# Patient Record
Sex: Male | Born: 1935 | Race: White | Hispanic: No | Smoking: Former smoker
Health system: Southern US, Community
[De-identification: ages and names within clinical notes are randomized; demographics above are authoritative.]

## PROBLEM LIST (undated history)

## (undated) DIAGNOSIS — I10 Essential (primary) hypertension: Secondary | ICD-10-CM

## (undated) DIAGNOSIS — K219 Gastro-esophageal reflux disease without esophagitis: Secondary | ICD-10-CM

## (undated) DIAGNOSIS — I2510 Atherosclerotic heart disease of native coronary artery without angina pectoris: Secondary | ICD-10-CM

## (undated) DIAGNOSIS — M1990 Unspecified osteoarthritis, unspecified site: Secondary | ICD-10-CM

## (undated) DIAGNOSIS — K5900 Constipation, unspecified: Secondary | ICD-10-CM

## (undated) DIAGNOSIS — C801 Malignant (primary) neoplasm, unspecified: Secondary | ICD-10-CM

## (undated) DIAGNOSIS — R32 Unspecified urinary incontinence: Secondary | ICD-10-CM

## (undated) DIAGNOSIS — E7800 Pure hypercholesterolemia, unspecified: Secondary | ICD-10-CM

## (undated) DIAGNOSIS — G4730 Sleep apnea, unspecified: Secondary | ICD-10-CM

## (undated) DIAGNOSIS — H9190 Unspecified hearing loss, unspecified ear: Secondary | ICD-10-CM

## (undated) DIAGNOSIS — I739 Peripheral vascular disease, unspecified: Secondary | ICD-10-CM

## (undated) HISTORY — PX: CARDIAC CATHETERIZATION: SHX172

## (undated) HISTORY — PX: ANAL FISSURE REPAIR: SHX2312

## (undated) HISTORY — PX: NASAL SINUS SURGERY: SHX719

## (undated) HISTORY — PX: IRRIGATION AND DEBRIDEMENT ABSCESS: SHX5252

## (undated) HISTORY — PX: CORONARY STENT PLACEMENT: SHX1402

## (undated) HISTORY — PX: COLON SURGERY: SHX602

## (undated) HISTORY — PX: COLONOSCOPY W/ POLYPECTOMY: SHX1380

## (undated) HISTORY — PX: LAPAROTOMY: SHX154

## (undated) HISTORY — PX: ROTATOR CUFF REPAIR: SHX139

## (undated) HISTORY — PX: PROSTATE SURGERY: SHX751

## (undated) HISTORY — PX: EYE SURGERY: SHX253

---

## 1997-10-10 ENCOUNTER — Encounter: Admission: RE | Admit: 1997-10-10 | Discharge: 1998-01-08 | Payer: Self-pay | Admitting: Orthopedic Surgery

## 1999-08-27 ENCOUNTER — Ambulatory Visit (HOSPITAL_COMMUNITY): Admission: RE | Admit: 1999-08-27 | Discharge: 1999-08-27 | Payer: Self-pay | Admitting: Rheumatology

## 1999-09-11 ENCOUNTER — Encounter: Payer: Self-pay | Admitting: Vascular Surgery

## 1999-09-14 ENCOUNTER — Encounter (INDEPENDENT_AMBULATORY_CARE_PROVIDER_SITE_OTHER): Payer: Self-pay

## 1999-09-14 ENCOUNTER — Inpatient Hospital Stay: Admission: RE | Admit: 1999-09-14 | Discharge: 1999-09-15 | Payer: Self-pay | Admitting: Vascular Surgery

## 2000-04-16 ENCOUNTER — Emergency Department (HOSPITAL_COMMUNITY): Admission: EM | Admit: 2000-04-16 | Discharge: 2000-04-16 | Payer: Self-pay | Admitting: Emergency Medicine

## 2000-04-16 ENCOUNTER — Encounter: Payer: Self-pay | Admitting: Emergency Medicine

## 2000-10-18 ENCOUNTER — Ambulatory Visit (HOSPITAL_COMMUNITY): Admission: RE | Admit: 2000-10-18 | Discharge: 2000-10-19 | Payer: Self-pay | Admitting: Orthopedic Surgery

## 2002-03-29 ENCOUNTER — Ambulatory Visit (HOSPITAL_COMMUNITY): Admission: RE | Admit: 2002-03-29 | Discharge: 2002-03-29 | Payer: Self-pay | Admitting: Gastroenterology

## 2003-03-14 ENCOUNTER — Emergency Department (HOSPITAL_COMMUNITY): Admission: EM | Admit: 2003-03-14 | Discharge: 2003-03-14 | Payer: Self-pay | Admitting: Emergency Medicine

## 2003-04-29 ENCOUNTER — Ambulatory Visit (HOSPITAL_COMMUNITY): Admission: RE | Admit: 2003-04-29 | Discharge: 2003-04-29 | Payer: Self-pay | Admitting: Rheumatology

## 2003-05-07 ENCOUNTER — Inpatient Hospital Stay (HOSPITAL_COMMUNITY): Admission: AD | Admit: 2003-05-07 | Discharge: 2003-05-13 | Payer: Self-pay | Admitting: Internal Medicine

## 2003-05-07 ENCOUNTER — Ambulatory Visit (HOSPITAL_COMMUNITY): Admission: RE | Admit: 2003-05-07 | Discharge: 2003-05-07 | Payer: Self-pay | Admitting: Rheumatology

## 2004-09-24 ENCOUNTER — Ambulatory Visit (HOSPITAL_COMMUNITY): Admission: RE | Admit: 2004-09-24 | Discharge: 2004-09-24 | Payer: Self-pay | Admitting: Rheumatology

## 2006-01-24 ENCOUNTER — Inpatient Hospital Stay (HOSPITAL_COMMUNITY): Admission: EM | Admit: 2006-01-24 | Discharge: 2006-01-27 | Payer: Self-pay | Admitting: Emergency Medicine

## 2006-02-10 ENCOUNTER — Encounter (HOSPITAL_COMMUNITY): Admission: RE | Admit: 2006-02-10 | Discharge: 2006-05-11 | Payer: Self-pay | Admitting: *Deleted

## 2006-07-24 ENCOUNTER — Emergency Department (HOSPITAL_COMMUNITY): Admission: EM | Admit: 2006-07-24 | Discharge: 2006-07-24 | Payer: Self-pay | Admitting: Emergency Medicine

## 2007-04-07 ENCOUNTER — Inpatient Hospital Stay (HOSPITAL_COMMUNITY): Admission: EM | Admit: 2007-04-07 | Discharge: 2007-04-14 | Payer: Self-pay | Admitting: Emergency Medicine

## 2007-06-28 ENCOUNTER — Encounter: Admission: RE | Admit: 2007-06-28 | Discharge: 2007-06-28 | Payer: Self-pay | Admitting: *Deleted

## 2008-03-07 HISTORY — PX: OTHER SURGICAL HISTORY: SHX169

## 2008-04-06 ENCOUNTER — Inpatient Hospital Stay (HOSPITAL_COMMUNITY): Admission: EM | Admit: 2008-04-06 | Discharge: 2008-04-08 | Payer: Self-pay | Admitting: Emergency Medicine

## 2010-05-29 ENCOUNTER — Inpatient Hospital Stay (HOSPITAL_COMMUNITY): Admission: EM | Admit: 2010-05-29 | Discharge: 2010-06-03 | Payer: Self-pay | Admitting: Emergency Medicine

## 2010-09-23 LAB — CBC
HCT: 34.3 % — ABNORMAL LOW (ref 39.0–52.0)
HCT: 34.4 % — ABNORMAL LOW (ref 39.0–52.0)
HCT: 35.5 % — ABNORMAL LOW (ref 39.0–52.0)
HCT: 35.6 % — ABNORMAL LOW (ref 39.0–52.0)
HCT: 35.7 % — ABNORMAL LOW (ref 39.0–52.0)
Hemoglobin: 11.4 g/dL — ABNORMAL LOW (ref 13.0–17.0)
Hemoglobin: 11.5 g/dL — ABNORMAL LOW (ref 13.0–17.0)
Hemoglobin: 11.6 g/dL — ABNORMAL LOW (ref 13.0–17.0)
Hemoglobin: 11.8 g/dL — ABNORMAL LOW (ref 13.0–17.0)
Hemoglobin: 11.8 g/dL — ABNORMAL LOW (ref 13.0–17.0)
Hemoglobin: 11.9 g/dL — ABNORMAL LOW (ref 13.0–17.0)
MCH: 30.7 pg (ref 26.0–34.0)
MCH: 30.9 pg (ref 26.0–34.0)
MCHC: 33 g/dL (ref 30.0–36.0)
MCHC: 33.1 g/dL (ref 30.0–36.0)
MCHC: 33.1 g/dL (ref 30.0–36.0)
MCHC: 33.5 g/dL (ref 30.0–36.0)
MCHC: 33.5 g/dL (ref 30.0–36.0)
MCV: 92.7 fL (ref 78.0–100.0)
MCV: 93.2 fL (ref 78.0–100.0)
MCV: 93.2 fL (ref 78.0–100.0)
MCV: 93.4 fL (ref 78.0–100.0)
Platelets: 208 10*3/uL (ref 150–400)
Platelets: 215 10*3/uL (ref 150–400)
Platelets: 216 K/uL (ref 150–400)
RBC: 3.69 MIL/uL — ABNORMAL LOW (ref 4.22–5.81)
RBC: 3.7 MIL/uL — ABNORMAL LOW (ref 4.22–5.81)
RBC: 3.77 MIL/uL — ABNORMAL LOW (ref 4.22–5.81)
RBC: 3.84 MIL/uL — ABNORMAL LOW (ref 4.22–5.81)
RDW: 13.8 % (ref 11.5–15.5)
WBC: 10.4 10*3/uL (ref 4.0–10.5)
WBC: 11.1 10*3/uL — ABNORMAL HIGH (ref 4.0–10.5)
WBC: 11.9 10*3/uL — ABNORMAL HIGH (ref 4.0–10.5)
WBC: 9.2 K/uL (ref 4.0–10.5)
WBC: 9.8 10*3/uL (ref 4.0–10.5)

## 2010-09-23 LAB — BASIC METABOLIC PANEL
BUN: 20 mg/dL (ref 6–23)
CO2: 23 mEq/L (ref 19–32)
Calcium: 8.7 mg/dL (ref 8.4–10.5)
Calcium: 8.7 mg/dL (ref 8.4–10.5)
Chloride: 107 mEq/L (ref 96–112)
Chloride: 108 mEq/L (ref 96–112)
Chloride: 109 mEq/L (ref 96–112)
Creatinine, Ser: 1.03 mg/dL (ref 0.4–1.5)
GFR calc Af Amer: >60 mL/min (ref >60)
GFR calc Af Amer: >60 mL/min (ref >60)
GFR calc Af Amer: >60 mL/min (ref >60)
GFR calc non Af Amer: 57 mL/min — ABNORMAL LOW (ref >60)
GFR calc non Af Amer: >60 mL/min (ref >60)
GFR calc non Af Amer: >60 mL/min (ref >60)
Glucose, Bld: 109 mg/dL — ABNORMAL HIGH (ref 70–99)
Glucose, Bld: 112 mg/dL — ABNORMAL HIGH (ref 70–99)
Glucose, Bld: 116 mg/dL — ABNORMAL HIGH (ref 70–99)
Potassium: 3.7 mEq/L (ref 3.5–5.1)
Potassium: 3.9 mEq/L (ref 3.5–5.1)
Potassium: 4.1 mEq/L (ref 3.5–5.1)
Potassium: 4.2 mEq/L (ref 3.5–5.1)
Sodium: 137 mEq/L (ref 135–145)
Sodium: 139 mEq/L (ref 135–145)
Sodium: 140 mEq/L (ref 135–145)

## 2010-09-23 LAB — LIPID PANEL
HDL: 37 mg/dL — ABNORMAL LOW (ref >39)
LDL Cholesterol: 84 mg/dL (ref 0–99)
VLDL: 14 mg/dL (ref 0–40)

## 2010-09-23 LAB — CARDIAC PANEL(CRET KIN+CKTOT+MB+TROPI)
CK, MB: 1.3 ng/mL (ref 0.3–4.0)
CK, MB: 1.5 ng/mL (ref 0.3–4.0)
Relative Index: INVALID (ref 0.0–2.5)
Relative Index: INVALID (ref 0.0–2.5)
Troponin I: 0.02 ng/mL (ref 0.00–0.06)

## 2010-09-23 LAB — COMPREHENSIVE METABOLIC PANEL WITH GFR
ALT: 18 U/L (ref 0–53)
Calcium: 9 mg/dL (ref 8.4–10.5)
Chloride: 106 meq/L (ref 96–112)
Creatinine, Ser: 1.06 mg/dL (ref 0.4–1.5)
Glucose, Bld: 134 mg/dL — ABNORMAL HIGH (ref 70–99)
Sodium: 139 meq/L (ref 135–145)

## 2010-09-23 LAB — COMPREHENSIVE METABOLIC PANEL
AST: 26 U/L (ref 0–37)
Albumin: 3.6 g/dL (ref 3.5–5.2)
Alkaline Phosphatase: 59 U/L (ref 39–117)
BUN: 16 mg/dL (ref 6–23)
CO2: 23 mEq/L (ref 19–32)
GFR calc Af Amer: >60 mL/min (ref >60)
GFR calc non Af Amer: >60 mL/min (ref >60)
Potassium: 3.7 mEq/L (ref 3.5–5.1)
Total Bilirubin: 0.5 mg/dL (ref 0.3–1.2)
Total Protein: 6.8 g/dL (ref 6.0–8.3)

## 2010-09-23 LAB — HEPARIN LEVEL (UNFRACTIONATED)
Heparin Unfractionated: 0.33 IU/mL (ref 0.30–0.70)
Heparin Unfractionated: 0.5 IU/mL (ref 0.30–0.70)
Heparin Unfractionated: 0.63 IU/mL (ref 0.30–0.70)

## 2010-09-23 LAB — CK TOTAL AND CKMB (NOT AT ARMC)
CK, MB: 2.2 ng/mL (ref 0.3–4.0)
Relative Index: 1.9 (ref 0.0–2.5)
Total CK: 113 U/L (ref 7–232)

## 2010-09-23 LAB — PROTIME-INR
INR: 0.92 (ref 0.00–1.49)
Prothrombin Time: 12.6 s (ref 11.6–15.2)

## 2010-09-23 LAB — POCT CARDIAC MARKERS
CKMB, poc: 1.1 ng/mL (ref 1.0–8.0)
Myoglobin, poc: 96.5 ng/mL (ref 12–200)
Troponin i, poc: <0.05 ng/mL (ref 0.00–0.09)

## 2010-09-23 LAB — D-DIMER, QUANTITATIVE: D-Dimer, Quant: 0.95 ug/mL-FEU — ABNORMAL HIGH (ref 0.00–0.48)

## 2010-09-23 LAB — MRSA PCR SCREENING: MRSA by PCR: NEGATIVE

## 2010-09-23 LAB — TROPONIN I: Troponin I: 0.03 ng/mL (ref 0.00–0.06)

## 2010-09-23 LAB — BRAIN NATRIURETIC PEPTIDE: Pro B Natriuretic peptide (BNP): 224 pg/mL — ABNORMAL HIGH (ref 0.0–100.0)

## 2010-09-23 LAB — TSH: TSH: 2.044 u[IU]/mL (ref 0.350–4.500)

## 2010-09-23 LAB — HEMOGLOBIN A1C
Hgb A1c MFr Bld: 6.2 % — ABNORMAL HIGH (ref <5.7)
Mean Plasma Glucose: 131 mg/dL — ABNORMAL HIGH (ref <117)
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
## 2010-11-24 NOTE — Cardiovascular Report (Signed)
NAMEMASAHIRO, Huffman NO.:  [id number]   MEDICAL RECORD NO.:  [id number]          PATIENT TYPE:  INP   LOCATION:  6532                         FACILITY:  [facility]   PHYSICIAN:  Nicki Guadalajara, M.D.     DATE OF BIRTH:  [birth year]   DATE OF PROCEDURE:  04/12/2007  DATE OF DISCHARGE:                            CARDIAC CATHETERIZATION   INDICATIONS:  [name] is an 78 year old gentleman who has  a history of hypertension, known coronary artery disease.  In July 2007  he underwent insertion of 3.5 x 20-mm Taxus stent into the proximal  circumflex vessel.  At that time, he did have diagonal LAD disease which  was treated medically.  He had presented this admission with chest pain  compatible with unstable angina.  Catheterization was done on April 10, 2007, which showed eccentric 85% in-stent restenosis in the proximal  circumflex as well as progressive diagonal LAD disease with 90% ostial  diagonal, as well as 80% narrowing in the LAD at the bifurcation.  At  the time of his diagnostic study he underwent cutting balloon arthrotomy  and PTCA with ultimate dilatation of his in-stent restenosis to 4.29 mm  in a large circumflex system.  He is now brought back to the laboratory  2 days later following adequate hydration for attempt at complex  intervention for his bifurcation stenosis of his diagonal and LAD  system.   PROCEDURE:  After premedication with Valium 4 mg intravenously, the  patient was prepped and draped in usual fashion.  His right femoral  artery was punctured anteriorly and a 6-French sheath was inserted.  There was difficulty in having a guiding catheter get coaxial into the  LAD system, and multiple guiding catheters were initially used including  an XB3.5, FL4, Q-curve 4.0, and ultimately a CLS4.0 was successful.  Angiomax was administered.  The patient has been on Plavix.  He did  receive 75 mg of additional Plavix in the laboratory.  A  double-wire  technique was used for the bifurcation stenosis and a Luge wire,  following adequate anticoagulation documentation, was advanced down the  diagonal vessel.  A Prowater wire was advanced down the LAD.  Initially,  cutting balloon arthrotomy was performed with a 2.5 x 10-mm cutting  balloon positioned over the wire in the diagonal vessel.  Multiple  dilatations were made at the ostium of the diagonal vessel.  The cutting  balloon was then removed and advanced over the wire in the LAD system  and multiple dilatations were made up to 6 atmospheres in the LAD, again  at the bifurcation to reduce potential for plaque shifting during  stenting of the LAD.  A 3.0 x 18-mm drug-eluting Promus stent was then  successfully inserted and deployed x2 up to 15 atmospheres.  This  resulted in a good angiographic result of the LAD, although there was  some mild diffuse disease beyond the stented segment which did have some  mild haziness.  In addition, there did appear to be additional ostial  encroachment of the diagonal vessel.  The  diagonal was then rewired.  Post stent dilatation was done in the LAD stent with a 3.25 x 12-mm  Quantum balloon.  Very low level inflation was done distally just beyond  the stent to ensure a smooth transition since there was smooth diffuse  disease beyond this stented segment.  A new 2.5 x 12-mm Maverick balloon  was then inserted over the wire down across the stent into the diagonal  vessel.  Dilatation was done in the stent strut to ensure optimal ostial  dilatation of the diagonal vessel up to 6 atmospheres.  Scout  angiography confirmed a good angiographic result.  During the procedure,  the patient was on increasing doses of IV nitroglycerin as well as  received several doses of 200 mcg of intracoronary nitroglycerin.  He  tolerated the procedure well.  He also received an additional 25 mg of  fentanyl for back comfort.  Plans are for sheath removal later  today.   HEMODYNAMIC DATA:  Central aortic pressure was 135/67.  During the  procedure, his systolic blood pressure did increase to 180 mg for which  his IV nitroglycerin dose was ultimately titrated up to 40 mcg.   HEMODYNAMIC DATA:  Left main coronary artery was a short vessel that  immediately bifurcated into the LAD and left circumflex system.  The  previous intervention in the circumflex stented segment remained widely  patent.  The LAD had diffuse bifurcation stenosis with 90% narrowing in  the ostium of the first diagonal vessel and diffuse 85% stenoses in the  LAD at the bifurcation and extending beyond the bifurcation.  This was  then followed by a diffuse 30% narrowing and again followed by mid LAD  30% narrowing.  Following cutting balloon arthrotomy into the diagonal  vessel and LAD, with stenting of the LAD with a 3-0 x 18-mm drug-eluting  Promus stent with post dilatation up to 3.25 mm and ultimate dilatation  through the stent strut again in the ostium of the diagonal with a 2.5-  mm Maverick balloon, the diffuse 85% stenosis was reduced to 0%.  The  90% ostial diagonal stenosis was reduced to 10%.  There was no change in  the previously noted 20-30% narrowing in the LAD beyond the stented  segment as well as 30% narrowing in the mid LAD.  There was TIMI 3 flow.   IMPRESSION:  Successful complex bifurcation stenosis percutaneous  coronary intervention involving the diagonal and left anterior  descending artery with cutting balloon arthrotomy/percutaneous  transluminal coronary angioplasty of the diagonal vessel and stenting of  the LAD with a 3-0 x 18-mm Promus stent postdilated to 3.25 mm, done  with Angiomax/Plavix/IV and IC nitroglycerin.           ______________________________  Nicki Guadalajara, M.D.     TK/MEDQ  D:  04/12/2007  T:  04/12/2007  Job:  [id number]   cc:   Darlin Priestly, MD  Tammy R. Collins [place], M.D.

## 2010-11-24 NOTE — Discharge Summary (Signed)
NAMEMARKELL, Larry Huffman                  ACCOUNT NO.:  [id number]   MEDICAL RECORD NO.:  [id number]          PATIENT TYPE:  INP   LOCATION:  5127                         FACILITY:  [facility]   PHYSICIAN:  Herbie Saxon, MDDATE OF BIRTH:  [birth year]   DATE OF ADMISSION:  04/06/2008  DATE OF DISCHARGE:  04/08/2008                               DISCHARGE SUMMARY   DISCHARGE DIAGNOSES:  1. Proctitis.  2. Gastroesophageal reflux disease.  3. History of coronary artery disease.  4. Hyperlipidemia.  5. History of right leg deep vein thrombosis.  6. History of obstructive sleep apnea.  7. Hypertension, is stable.  8. Leukocytosis, resolved.  9. Anemia of chronic disease.   RADIOLOGY:  The CT abdomen, pelvis on April 06, 2008, was negative  except for small bilateral renal cyst and tiny right adrenal adenoma.   HOSPITAL COURSE:  This 77 year old Caucasian gentleman presented to the  emergency room because of severe constipation, inability to defecate  with severe pain in the anal and rectal area associated with nausea and  vomiting and some low-grade fever.  On presentation, he was afebrile.  He had leukocytosis of 16,000.  CT scan of the abdomen finding  significant  rectal wall thickening indicative of proctitis.  The  patient was started on stool softeners, Anusol Suppository, IV Cipro and  Flagyl and gentle IV fluid hydration, is clinically much improved within  the last 48 hours.  Leukocytosis is resolved.  The patient is anxious to  be discharged home and the patient's rectal pain is much improved.   DISCHARGE CONDITION:  Stable.   His diet to be low sodium, heart healthy, low cholesterol.   Activity to be increased slowly as tolerated.   Follow up with his primary care physician, Dr. Herb Grays in the next 5-  7 days.   MEDICATIONS ON DISCHARGE:  1. Anusol Suppository 1 b.i.d. for 1 week.  2. Cipro 500 mg b.i.d. for 5 days.  3. Flagyl 500 mg t.i.d. for 5 days.  4.  Tylenol 650 mg q.6 h. p.r.n.   Continue his home medication:  1. Plavix 75 mg daily.  2. Ranitidine 300 mg at bedtime.  3. Isosorbide dinitrate 15 mg daily.  4. Amlodipine 10 mg daily.  5. Detrol LA 4 mg daily.  6. Lasix 20 mg daily.  7. Simvastatin 10 mg daily.  8. Calcium 500 mg daily.  9. Multivitamin 1 tablet daily.  10.Fish oil 1 tablet daily.   PHYSICAL EXAMINATION:  GENERAL:  He is an elderly man not in acute  distress.  VITAL SIGNS:  Temperature is 98, pulse is 60, respiratory rate 18, blood  pressure 120/60.  SKIN:  Pale, not jaundiced.  HEENT:  Head is atraumatic and normocephalic.  Mucous membranes are  moist.  He is hard of hearing.  Good visual acuity.  NECK:  Supple.  There is no JVD or adenopathy.  CHEST:  Clinically clear.  No rhonchi or rales.  HEART:  Heart sounds 1 and 2.  No rubs, gallops, or murmurs.  ABDOMEN:  Benign.  NEUROLOGIC:  He is  alert, oriented in time, place, and person.  EXTREMITIES:  Peripheral pulses present.  No pedal edema.   LABORATORY DATA:  The WBC is 10, hematocrit 32, platelet count is 188.  Chemistry, sodium is 141, potassium 4.1, chloride 109, bicarb 27, BUN  18, creatinine 1.1, glucose is 96.   Discharge time less than 30 minutes.      Herbie Saxon, MD  Electronically Signed     MIO/MEDQ  D:  04/08/2008  T:  04/09/2008  Job:  [id number]

## 2010-11-24 NOTE — Discharge Summary (Signed)
NAMELIO, WEHRLY NO.:  [id number]   MEDICAL RECORD NO.:  [id number]          PATIENT TYPE:  INP   LOCATION:  6532                         FACILITY:  [facility]   PHYSICIAN:  Darlin Priestly, MD  DATE OF BIRTH:  [birth year]   DATE OF ADMISSION:  04/07/2007  DATE OF DISCHARGE:  04/14/2007                               DISCHARGE SUMMARY   DISCHARGE DIAGNOSES:  1. Unstable angina, status post staged PCI to the circumflex and LAD      this admission with post PCI CK bump to 200.  2. Renal artery stenosis with 70-80% right renal artery stenosis.  3. Normal LV function.  4. Treated hypertension.  5. Treated dyslipidemia.   HOSPITAL COURSE:  The patient is a pleasant 77 year old male followed by  Dr. Jenne Campus with a history of coronary disease.  He had an AV groove  intervention in July 2007.  He has peripheral vascular disease with  prior right carotid endarterectomy.  He had done well until admission  April 07, 2007, when he presented with chest pain consistent with  unstable angina.  He was admitted to telemetry, started on heparin and  nitrates.  He was somewhat bradycardic and we backed off on his beta-  blocker on admission.  The patient was taken to the cath lab April 10, 2007 for elective catheterization.  This was done by Dr. Tresa Endo.  He  had a small RCA without significant disease.  The circumflex had an 85%  proximal stenosis.  The LAD had an 80% stenosis just after the takeoff  of the diagonal which had a proximal 90% stenosis.  There was a small  septal perforator with a 90% stenosis as well.  Right renal artery was  narrowed 70-80%.  The patient underwent a circumflex cutting balloon on  April 10, 2007.  They tolerated this well.  We watched his renal  function and kept them until he could undergo a staged intervention to  the LAD.  This was done April 12, 2007.  The LAD received a PROMUS  stent.  The diagonal was angioplastied.   Postprocedure, he did have a CK  bumped to 203 with 19 MBs.  CK is down to 130 on October 3.  He is pain  free.  He has not had arrhythmia.  We feel he could be discharged later  today on October 3.  He will follow up with Dr. Jenne Campus as an  outpatient.  He is somewhat anemic, but he has received Integrilin and  has had multiple lab draws.  Will follow-up CBC as an outpatient and put  him on some iron.   DISCHARGE MEDICATIONS:  1. Aspirin 325 mg a day.  2. Zocor 40 mg a day.  3. Norvasc 10 mg a day.  4. Plavix 75 mg a day.  5. Metoprolol has been stopped.  6. Nexium 40 mg a day.  7. Vitamin C with niacin 500 mg h.s.  8. Lisinopril 5 mg a day.  9. Nitroglycerin sublingual p.r.n.  10.Iron b.i.d. with food.   LABORATORY DATA:  At  discharge, white count 9.2, hemoglobin 9.4,  hematocrit 27.6, platelets 195.  Sodium 139, potassium 4.0, BUN 26,  creatinine 1.3.  LFTs were normal.  CK peaked at 203 with an MB of 19  and troponin  3.75.  That was on the morning of October 2.  TSH 1.97.  Chest x-ray  showed no acute disease.  Urinalysis unremarkable.  INR is 1.  EKG shows  sinus rhythm without acute changes.   DISPOSITION:  The patient is discharged stable condition and will follow-  up with Dr. Jenne Campus as an outpatient.      Abelino Derrick, P.A.      Darlin Priestly, MD  Electronically Signed    LKK/MEDQ  D:  04/14/2007  T:  04/15/2007  Job:  [id number]

## 2010-11-24 NOTE — Cardiovascular Report (Signed)
Larry Huffman, Larry Huffman NO.:  [id number]   MEDICAL RECORD NO.:  [id number]          PATIENT TYPE:  INP   LOCATION:  2807                         FACILITY:  [facility]   PHYSICIAN:  Nicki Guadalajara, M.D.     DATE OF BIRTH:  1924/02/18   DATE OF PROCEDURE:  04/10/2007  DATE OF DISCHARGE:                            CARDIAC CATHETERIZATION   INDICATIONS:  [name] is a pleasant, 77 year old gentleman  who has a history of hypertension, known coronary artery disease who is  status post stenting of the proximal left circumflex vessel with a 3.5 x  28-mm, Taxus stent postdilated at 3.75 mm done by Dr. Jenne Campus on January 25, 2006.  At that time, he also had 60-70% stenosis in the marginal  vessel of circumflex beyond the stented segment.  He had diffuse 50% LAD  stenosis proximally with 80% ostial diagonal stenosis.  The patient had  done well, but recently was admitted to [facility] with chest  pain syndrome worrisome for unstable angina.  He was admitted by Dr.  Domingo Sep.  Cardiac catheterization was recommended.   PROCEDURE:  After premedication with Valium 4 mg intravenously, the  patient was prepped and draped in usual fashion.  His right femoral  artery was punctured anteriorly and a 5-French sheath was inserted.  Diagnostic cardiac catheterization was done utilizing 5-French, Judkins-  4 left and right coronary catheters.  A 5-French pigtail catheter was  used for biplane cine left ventriculography.  With the patient's  hypertensive history, distal aortography was also performed to make  certain there was not a renovascular etiology to his hypertension.   With the documentation of mid in-stent restenosis in the proximal  circumflex with narrowing up to 85% eccentrically as well as progressive  diagonal LAD disease, the decision was made to perform percutaneous  coronary intervention to the circumflex today with plans for stage  intervention to the LAD  diagonal bifurcation stenoses at a later date.  The 5-French sheath was upgraded to a 6-French system.  The patient had  been on chronic Plavix and 150 mg of Plavix was also given during the  procedure.  Angiomax was used for anticoagulation.  ACT was documented  to be therapeutic.  IC nitroglycerin was administered selectively into  the circumflex vessel.  In addition, the patient was on IV nitroglycerin  drip.  For the intervention, a 6-French, Voda-4 guide was used.  A  Prowater wire was advanced down the circumflex vessel.  A 4.0 x 15-mm,  cutting balloon was then advanced to the in-stent restenosis and  multiple dilatations initially at 2, 3, 4, 5, 6, 7 and ultimately 8  atmospheres were made corresponding to a 4.04 mm balloon size.  Initially, there was significant dogboning at the in-stent restenotic  site.  Ultimately, this did improve, but still had some mild residual  narrowing.  A 4.5 x 12-mm, Quantum balloon was then inserted and  multiple dilatations were made up to a maximum of 4.29 mm with  resolution of the residual dogbone resulting in an optimal result.  There was no change in the narrowing in the circumflex marginal and AV  groove circumflex which had been present previously.  The patient  tolerated the procedure well.  During the procedure, he also received  additional Valium 3 mg as well as 25 mg of fentanyl for back discomfort.  He left the catheterization in stable condition.   HEMODYNAMIC DATA:  Central aortic pressure was 180/67.  Left ventricular  pressure 180/18, post A-wave 31.   ANGIOGRAPHIC DATA:  Left main coronary artery immediately bifurcated  into an LAD and left circumflex system.  There was 30-40% smooth  narrowing in the proximal LAD before the septal perforating artery with  a very small, 80-90% focal narrowing in the proximal portion of the  septal perforator.  The LAD in the region of the diagonal had mild  narrowing and after the diagonal was 80%  stenosed diffusely.  There was  90% eccentric narrowing in the proximal diagonal vessel which had  progressed from 2007.  The remainder of the LAD was free of significant  disease.   The circumflex vessel had a previously placed stent which was a 3.5 x 28-  mm, Taxus stent extending from its ostium just proximal to the  bifurcation of the marginal vessel.  In the mid segment of this stented  region, there was 85% somewhat eccentric narrowing.  There was at least  50% bifurcation stenoses beyond the stented segment in the AV groove  circumflex and OM vessel.  The circumflex was a dominant vessel which  supplied additional marginal PD/PLA system.   The right coronary artery was a small-caliber nondominant vessel.   Biplane cine left ventriculography revealed normal LV contractility  without focal segmental wall motion abnormality.   Distal aortography suggested a 70-80% ostial proximal stenosis in the  right renal artery.   Following percutaneous coronary intervention of the left circumflex  coronary artery utilizing cutting balloon arthrotomy with a 4.0 x 15-mm,  cutting balloon and a 4.5 x 12-mm, Quantum balloon, the 85% area of in-  stent restenosis was reduced to 0%.  There was TIMI-3 flow.  There was  no change in the previous more distal, 50% AV groove, circumflex  narrowing and ostial OM-1 narrowing of 40-50%.   IMPRESSION:  1. Normal left ventricular function.  2. Two-vessel coronary artery disease with 30-40% proximal left      anterior descending stenosis, 80-90% bifurcation stenoses in the      left anterior descending diagonal system; 85% in-stent restenosis      in the proximal circumflex coronary artery at site of prior 3.5 x      28-mm, Taxus stenting with 50% bifurcation stenoses in the obtuse      marginal-1 and atrioventricular groove circumflex beyond the      stented segment in a dominant left circumflex system.  3. A 70-80% ostial narrowing in the right renal  artery.  4. Successful cutting balloon arthrotomy/percutaneous transluminal      coronary angiography of the left circumflex coronary artery at the      site of in-stent restenosis with the 85% stenosis being reduced to      0% done with bivalirudin/Plavix and IV and IC nitroglycerin.           ______________________________  Nicki Guadalajara, M.D.     TK/MEDQ  D:  04/10/2007  T:  04/10/2007  Job:  [id number]   cc:   Darlin Priestly, MD  Tammy R. Collins [place], M.D.

## 2010-11-27 NOTE — Discharge Summary (Signed)
NAMETOBEY, SCHMELZLE NO.:  [id number]   MEDICAL RECORD NO.:  [id number]          PATIENT TYPE:  INP   LOCATION:  2011                         FACILITY:  [facility]   PHYSICIAN:  Raymon Mutton, P.A. DATE OF BIRTH:  10-17-1923   DATE OF ADMISSION:  01/24/2006  DATE OF DISCHARGE:  01/27/2006                                 DISCHARGE SUMMARY   DISCHARGE DIAGNOSES:  1.  Status post acute myocardial infarction during this admission, treated      with percutaneous transluminal angioplasty and stenting of the      circumflex with drug-eluting TAXUS stent, by Dr. Jenne Campus.  2.  Hypertension.  3.  Hyperlipidemia.  4.  History of deep venous thrombosis.  5.  Questionable history of transient ischemic attack.  6.  Gastroesophageal reflux disease.   HOSPITAL PROCEDURES:  Cardiac catheterization preformed by Dr. Jenne Campus on  January 25, 2006, that showed high-grade lesion in the circumflex, mid portion,  treated with balloon angioplasty and TAXUS stent and reduction of the lesion  from 99 to less then 10%.  No critical residual disease in the LAD and 50%  stenosis.  The patient also had __________ in the diagonal branch and obtuse  marginal and proximal circumflex his ejection fraction was 45% anterior  lateral hypokinesis was noted.   For full description of this procedure please refer to dictated note by Dr.  Jenne Campus.   HISTORY OF PRESENT ILLNESS/HOSPITAL COURSE:  This is a  pleasant 77 year old  gentleman patient of Dr. Babette Relic R. [place], who developed chest pain and had  been tolerating this for 2 or 3 days prior to his admission to the hospital.  He did not have any signs of shortness of breath, nausea, or diaphoresis.  He developed some lateral EKG changes and was admitted to the hospital on  rule out MI protocol.  His enzymes turned out to be positive and the patient  was diagnosed with acute myocardial infarction and taken to the Cath Lab on  January 25, 2006.  Dr. Jenne Campus  performed the procedure with stenting of the  native AV groove circumflex.  The patient tolerated that well was  transferred to the unit in a stable condition.   We cycled his enzymes and they were gradually tapering down.  His other  laboratories did not reveal significant changes.  Except, there was  elevation of AST, that was probably secondary to myocardial infarction and  ALT was negative.   Dr. Jacinto Halim assessed patient on January 27, 2006 and the patient had an elevated  temperature at 101.3 at 4 o'clock in the morning.  In the morning he was  afebrile and Dr. Hedy Jacob felt that it would be possible for the patient to  be discharged home later this afternoon if his temperature remains normal  and the patient is stable and has no significant complaints.   HOSPITAL LABORATORIES:  We cycled his enzymes and the first set of enzymes  revealed of CK 1013, CK MB 81.3 and troponin 22.31.  Enzymes were gradually  declining from the labs that revealed CK 513,  CK MB 12.3 and troponin 18.27.   White blood cell count was 13, platelets 151, hemoglobin 10.1, hematocrit  30.0, BMP showed sodium 138, potassium 3.7, BUN 18, creatinine 1.1, glucose  106.  Liver function tests initially revealed AST of 81.  On January 24, 2006,  it increased to 135, yesterday on January 26, 2006 and today, January 27, 2006 it  was 50.  The rest of the liver function tests were normal.  TSH was normal  1.679.   DISCHARGE MEDICATIONS:  1.  Aspirin 325 mg daily.  2.  Zocor 40 mg daily.  3.  Protonix 40 mg daily.  4.  Norvasc 2.5 mg daily.  5.  Plavix 75 mg daily.  6.  Altace 5 mg daily.  7.  Lopressor 12.5 mg b.i.d.   DISCHARGE INSTRUCTIONS:  The patient is to stay on a low fat, low  cholesterol diet and no driving, no lifting greater than 5 pound for 1 week.  He will be seen by Dr. Jenne Campus in our office on February 09, 2006 at 12 o'clock  and also post that visit he would need to start cardiac rehab phase 2  program.   If  the patient remains afebrile and stable in the afternoon, he might be  discharged home.      Raymon Mutton, P.A.     MK/MEDQ  D:  01/27/2006  T:  01/27/2006  Job:  [id number]   cc:   Darlin Priestly, MD  Fax: [phone]

## 2010-11-27 NOTE — Discharge Summary (Signed)
NAME:  Larry Huffman, Larry Huffman                       ACCOUNT NO.:  [id number]   MEDICAL RECORD NO.:  [id number]                   PATIENT TYPE:  INP   LOCATION:  5707                                 FACILITY:  [facility]   PHYSICIAN:  Darius Bump, M.D.             DATE OF BIRTH:  [birth year]   DATE OF ADMISSION:  05/07/2003  DATE OF DISCHARGE:  05/13/2003                                 DISCHARGE SUMMARY   ADMISSION DIAGNOSES:  1. Femoral vein thrombosis with propagation of a right greater saphenous     superficial thrombophlebitis.  2. Eczema.  3. Osteoarthritis.  4. Gastroesophageal reflux disease.   DISCHARGE DIAGNOSES:  1. Femoral vein thrombosis anticoagulated on Coumadin.  2. Dermatitis.  3. History of gastroesophageal reflux disease.   CONDITION ON DISCHARGE:  Improved.   DISCHARGE MEDICATIONS:  1. Aciphex 20 mg daily.  2. Coated aspirin 81 mg daily.  3. Coumadin 5 mg one and a half tablets on the day of discharge he is to go     for a pro time at [employer]. Coral Spikes, M.D. office in the morning.  4. Triamcinolone and fluocinonide cream as prior to admission.   FOLLOWUP:  He is to follow up in [place]. Levitin, M.D. office for a pro  time on the day following discharge and go to a follow-up visit in one to  two weeks.   BRIEF HISTORY:  Larry Huffman is a very pleasant gentleman who was admitted  through the office on May 07, 2003 with a Doppler evidence of  propagation of a superficial saphenous vein ____________ to the femoral  vein.  He was admitted to a regular hospital bed.  He was placed on Lovenox  per pharmacy and Coumadin was started on May 07, 2003.  On the day prior  to discharge, May 12, 2003 INR was 2.3.  The patient was remained on  Lovenox and Coumadin until he was therapeutic for 48 hours.  On May 13, 2003 INR was 2.6 and Lovenox was discontinued with continued Coumadin  therapy as an outpatient.    LABORATORIES:  White count normal throughout  hospitalization.  Hemoglobin  ranged from 12.8 to 13.3.  INR at admission was 0.9, but therapeutic at 2.6  at the time of discharge.  Electrolytes remarkable only for a glucose of 100  which was not a fasting sample.  LFTs were normal.  Urine was unremarkable.  X-ray:  Chest x-ray done at time of admission showed no evidence of  cardiopulmonary disease.                                                Darius Bump, M.D.    MJM/MEDQ  D:  05/27/2003  T:  05/27/2003  Job:  [id number]   cc:  Demetria Pore. Coral Spikes, M.D.  [address]  [address]  [place]  [place] [address]  Fax: [phone]

## 2010-11-27 NOTE — Cardiovascular Report (Signed)
Larry Huffman, Larry Huffman NO.:  [id number]   MEDICAL RECORD NO.:  [id number]          PATIENT TYPE:  INP   LOCATION:  2914                         FACILITY:  [facility]   PHYSICIAN:  Darlin Priestly, MD  DATE OF BIRTH:  Nov 22, 1923   DATE OF PROCEDURE:  01/25/2006  DATE OF DISCHARGE:                              CARDIAC CATHETERIZATION   PROCEDURES:  1. Left heart catheterization.  2. Coronary angiography.  3. Left ventriculogram.  4. Left circumflex-mid.  5. Percutaneous transluminal coronary balloon angioplasty.  6. Placement of intracoronary stent.   ATTENDING:  Darlin Priestly, MD.   COMPLICATIONS:  None.   INDICATIONS:  Larry Huffman is a 77 year old male patient of Dr. Alda Berthold with a  history of hyperlipidemia who was admitted on January 24, 2006, with substernal-  like chest pain with lateral EKG changes.  He did subsequently rule in for  non-Q-wave MI.  He is now brought for cardiac catheterization to reassess  his coronary anatomy.   DESCRIPTION OF OPERATION:  After giving informed consent, the patient  brought to the Cardiac Cath Lab where right groin was shaved, prepped and  draped in the usual sterile fashion.  EG monitoring established.  Using  modified Seldinger technique, a #6 French arterial sheath inserted in the  right femoral artery.  A #6 French diagnostic catheter was used to perform  diagnostic angiography.   Left main is a large, short vessel with no significant disease.   The LAD is a large vessel, coursing the apex, giving rise to one diagonal  branch.  The LAD has diffuse 50% disease throughout its early and mid  segment extending across the first diagonal.   The first diagonal is a small to medium sized vessel with an 80% ostial  lesion.   The left circumflex was a large vessel, coursing the AV groove, giving rise  to three obtuse marginal branches.  AV groove circumflex is noted to have  diffuse 60% proximal disease and then 99%  lesion prior to bifurcation of the  second OM.  The remainder of the AV groove circumflex has significant  disease.   The first OM is a small vessel.   The second OM is a medium-sized vessel which bifurcates distally with a 50-  70% proximal lesion.   The third OM is a medium-sized vessel with no significant disease.   The right coronary artery is a small non-dominant vessel with no significant  disease.   LEFT VENTRICULOGRAM:  Reveals a mildly depressed EF at 45% with  anterolateral hypokinesis.   HEMODYNAMIC SYSTEM:  The aortic pressure 109/48, LV systolic pressure 109/8,  LVEDP of 38.   INTERVENTIONAL PROCEDURE:  Left circumflex:  Following diagnostic  angiography, a #6 JL4 guiding catheter was successfully engaged in the left  coronary ostium.  Next, a .014 ATW marker wire was advanced out of the  guiding catheter and positioned in the distal second OM without difficulty.  Following this, a Maverick 3 x 15 mm balloon was then positioned across the  mid AV groove stenotic lesion and two inflations at  10 atmospheres performed  for a total of 47 seconds.  Follow up angiogram revealed good luminal gain.  The balloon was then used to measure the circumflex lesion.  This balloon  was removed and a TAXUS 3.5 x 28 mm stent was then positioned in the mid AV  groove circumflex across the stenotic lesion.  The stent was then deployed  at 10 atmospheres for a total of 25 seconds.  Follow up angiogram revealed a  good luminal gain.  This balloon was then removed and a 3.75 x 8 mm Maverick  balloon was then positioned in the mid AV groove stent and approximately  four inflations were then performed throughout the mid and proximal  circumflex stent to a maximum of 12 atmospheres for a total of approximately  1 minute and 15 seconds.  Follow up angiogram revealed good luminal gain  with no evidence of dissection or thrombus and TIMI-3 flow to distal vessel.  IV Integrilin was used throughout  the case.  Intravenous dose of heparin  given to maintain the ACT between 200 and 300.   Final orthogonal angiograms revealed less than 10% residual stenosis in the  proximal mid AV groove circumflex with TIMI-3 flow to distal vessel.  At  this point, I elected to conclude the procedure.  All balloons, wires, and  catheters were removed.  Hemostatic sheaths were sewn in place and patient  transferred back to the ward in stable condition.   CONCLUSIONS:  1. Successful percutaneous transluminal coronary balloon angioplasty and      placement of a TAXUS 3.5 x 28 mm stent in the mid atrioventricular      groove circumflex stenotic lesion, ultimately post dilated to 3.75 mm.  2. Non-critical disease of the left anterior descending with 80% ostial      diagonal disease.  3. Mildly depressed left ventricular systolic function.  4. Elevated left ventricular end-diastolic pressure.  5. Adjuvant use of Integrilin infusion.      Darlin Priestly, MD  Electronically Signed     RHM/MEDQ  D:  01/25/2006  T:  01/25/2006  Job:  [id number]   cc:   Dani Gobble, MD  Tammy R. [place], M.D.

## 2010-11-27 NOTE — Op Note (Signed)
[facility]  Patient:    Larry Huffman, Larry Huffman                    MRN: [id number] Proc. Date: 10/18/00 Adm. Date:  [id number] Attending:  Cornell Barman                           Operative Report  PREOPERATIVE DIAGNOSIS:  Massive rotator cuff tear right shoulder.  POSTOPERATIVE DIAGNOSIS:  Massive rotator cuff tear right shoulder.  PROCEDURE:  Acromioplasty and repair of rotator cuff tear right shoulder.  SURGEON:  Lenard Galloway. Chaney Malling, M.D.  ANESTHESIA:  General anesthesia.  DESCRIPTION OF PROCEDURE:  The patient was placed on the operating table in the supine position.  After satisfactory orotracheal anesthesia, the patient was placed in the sitting position on the shoulder holder.  The right upper extremity shoulder was prepped with Duraprep and then draped out in the usual manner.  A sabercut incision was made over the anterolateral aspect of the acromion.  Skin edges were retracted.  Bleeders were coagulated.  A self retaining retractor was inserted.  Deltoid fibers released off the anterior aspect of the acromion.  An electric saw was then used to do a very generous Neer anterior 1/3 acromioplasty.  Once this was completed, excellent access to the subacromial space was obtained.  The rotator cuff was massively avulsed. In fact, no part of the rotator cuff was attached to the humeral head.  A great deal of dissection with the finger was done and irrigation.  Portions of the posterior part of the rotator cuff could be pulled forward and some portions of the anterior cuff could be pulled forward.  ______ staple was placed in the nonarticular portion of the proximal humerus adjacent to the humeral head and the sutures were passed through the advanced portion of the rotator cuff.  A great deal of time was spent trying to repair the cuff. About 50 to 60% of the anterior half of the humeral head could be covered with the rotator cuff.  This  was sutured in place.  Repair of the posterior part of the cuff was simply impossible due to the marked shortening fibrosis and very friable nature of the cuff itself.  Stitches would not hold in the cuff adequately in this area.  Throughout the procedure the shoulder was irrigated with copious amounts of antibiotic solution.  The deltoid  fibers were then reattached with heavy Vicryl.  2-0 Vicryl was used to close the subcutaneous tissue.  Stainless steel staples were used to close the skin.  The shoulder was injected with Marcaine.  Sterile dressings were applied.  The patient returned to the recovery room in excellent condition with an abduction pillow. Tension was extremely well.  Drains were none.  Complications were none. DD:  10/18/00 TD:  10/18/00 Job: [id number]
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
## 2010-11-27 NOTE — Op Note (Signed)
   NAME:  Larry Huffman, Larry Huffman                       ACCOUNT NO.:  [id number]   MEDICAL RECORD NO.:  [id number]                   PATIENT TYPE:  AMB   LOCATION:  ENDO                                 FACILITY:  [facility]   PHYSICIAN:  Danise Edge, MD                  DATE OF BIRTH:  1924/05/09   DATE OF PROCEDURE:  03/29/2002  DATE OF DISCHARGE:                                 OPERATIVE REPORT   PROCEDURE:  Screening colonoscopy.   INDICATIONS:  The patient is a 77 year old male born [birth year].  The patient  underwent his first colonoscopy in 1987 and a hyperplastic polyp was  removed.  He has undergone two screening flexible proctosigmoidoscopies in  the intervening years, which have been normal.  He is now due for a repeat  screening colonoscopy with polypectomy to prevent colon cancer.   ENDOSCOPIST:  Danise Edge, M.D.   PREMEDICATION:  Fentanyl 25 mcg, Versed 5 mg.   ENDOSCOPE:  Olympus pediatric colonoscope.   DESCRIPTION OF PROCEDURE:  After obtaining informed consent, the patient was  placed in the left lateral decubitus position.  I administered intravenous  fentanyl and intravenous Versed to achieve conscious sedation for the  procedure.  The patient's blood pressure, oxygen saturation, and cardiac  rhythm were monitored throughout the procedure and documented in the medical  record.   Anal inspection was normal.  Digital rectal exam was normal.  The Olympus  pediatric video colonoscope was introduced into the rectum and easily  advanced to the cecum.  Colonic preparation for the exam today was  excellent.   Rectum normal.   Sigmoid colon and descending colon normal.   Splenic flexure normal.   Transverse colon normal.   Hepatic flexure normal.   Ascending colon normal.   Cecum and ileocecal valve normal.    ASSESSMENT:  Normal screening proctocolonoscopy to the cecum.  No endoscopic  evidence for the presence of colorectal neoplasia.                              Danise Edge, MD    MJ/MEDQ  D:  03/29/2002  T:  03/30/2002  Job:  [id number]   cc:   Demetria Pore. Levitin, MD  [address]  [address]  [place]  [place] [address]  Fax: [phone]

## 2011-04-12 LAB — COMPREHENSIVE METABOLIC PANEL
ALT: 26
AST: 21
AST: 29
Albumin: 4
Alkaline Phosphatase: 77
CO2: 27
Calcium: 8.6
Creatinine, Ser: 1.19
GFR calc Af Amer: >60
GFR calc Af Amer: >60
GFR calc non Af Amer: 59 — ABNORMAL LOW
Potassium: 3.8
Sodium: 137
Total Protein: 7.3

## 2011-04-12 LAB — URINALYSIS, ROUTINE W REFLEX MICROSCOPIC
Bilirubin Urine: NEGATIVE
Glucose, UA: NEGATIVE
Hgb urine dipstick: NEGATIVE
Specific Gravity, Urine: 1.016

## 2011-04-12 LAB — CBC
HCT: 32.2 — ABNORMAL LOW
Hemoglobin: 10.7 — ABNORMAL LOW
MCHC: 33.7
MCV: 93.9
Platelets: 241
RBC: 3.33 — ABNORMAL LOW
RDW: 14
WBC: 10
WBC: 16.6 — ABNORMAL HIGH

## 2011-04-12 LAB — DIFFERENTIAL
Basophils Relative: 0
Eosinophils Absolute: 0
Eosinophils Relative: 0
Monocytes Absolute: 1.3 — ABNORMAL HIGH
Monocytes Relative: 8
Neutro Abs: 13.9 — ABNORMAL HIGH

## 2011-04-12 LAB — URINE CULTURE

## 2011-04-22 LAB — CK TOTAL AND CKMB (NOT AT ARMC)
CK, MB: 20.3 — ABNORMAL HIGH
CK, MB: 6.7 — ABNORMAL HIGH
CK, MB: 2
CK, MB: 2.1
CK, MB: 2.3
Relative Index: 13.7 — ABNORMAL HIGH
Relative Index: 5.2 — ABNORMAL HIGH
Relative Index: INVALID
Relative Index: INVALID
Total CK: 130
Total CK: 229

## 2011-04-22 LAB — BASIC METABOLIC PANEL
BUN: 26 — ABNORMAL HIGH
BUN: 22
BUN: 25 — ABNORMAL HIGH
CO2: 24
Calcium: 8.3 — ABNORMAL LOW
Chloride: 111
Chloride: 111
Creatinine, Ser: 1.22
GFR calc Af Amer: >60
GFR calc Af Amer: >60
GFR calc non Af Amer: >60
GFR calc non Af Amer: 57 — ABNORMAL LOW
GFR calc non Af Amer: 57 — ABNORMAL LOW
Glucose, Bld: 142 — ABNORMAL HIGH
Potassium: 4
Potassium: 3.9
Potassium: 4
Potassium: 4.1
Sodium: 139
Sodium: 140

## 2011-04-22 LAB — DIFFERENTIAL
Basophils Absolute: 0.1
Basophils Relative: 1
Lymphocytes Relative: 30
Neutro Abs: 5.2
Neutrophils Relative %: 55

## 2011-04-22 LAB — COMPREHENSIVE METABOLIC PANEL
ALT: 20
Alkaline Phosphatase: 50
BUN: 28 — ABNORMAL HIGH
CO2: 24
Calcium: 8.4
GFR calc non Af Amer: 47 — ABNORMAL LOW
Glucose, Bld: 163 — ABNORMAL HIGH
Sodium: 135

## 2011-04-22 LAB — TROPONIN I
Troponin I: 2.5
Troponin I: 0.05

## 2011-04-22 LAB — POCT CARDIAC MARKERS
CKMB, poc: 2.4
CKMB, poc: 3.6
Myoglobin, poc: 157
Myoglobin, poc: 166
Operator id: 294501
Operator id: 294501
Troponin i, poc: <0.05

## 2011-04-22 LAB — CBC
HCT: 26.9 — ABNORMAL LOW
HCT: 29.2 — ABNORMAL LOW
HCT: 32.1 — ABNORMAL LOW
Hemoglobin: 9.4 — ABNORMAL LOW
MCHC: 33.6
MCHC: 34.1
MCHC: 33.7
MCV: 94.3
MCV: 93.3
MCV: 93.4
MCV: 94.3
Platelets: 172
Platelets: 178
Platelets: 180
Platelets: 192
Platelets: 203
Platelets: 210
RBC: 3.08 — ABNORMAL LOW
RDW: 13.8
RDW: 13.9
RDW: 13.7
RDW: 13.9
WBC: 9.1
WBC: 9.5
WBC: 9.5

## 2011-04-22 LAB — CARDIAC PANEL(CRET KIN+CKTOT+MB+TROPI)
CK, MB: 3.5
Relative Index: 9.8 — ABNORMAL HIGH
Relative Index: 2
Relative Index: 2
Total CK: 176
Troponin I: 3.75
Troponin I: 0.02
Troponin I: 0.02

## 2011-04-22 LAB — HEPARIN LEVEL (UNFRACTIONATED)
Heparin Unfractionated: 0.39
Heparin Unfractionated: 0.52
Heparin Unfractionated: 0.66
Heparin Unfractionated: 0.7

## 2011-04-22 LAB — URINALYSIS, ROUTINE W REFLEX MICROSCOPIC
Glucose, UA: NEGATIVE
Hgb urine dipstick: NEGATIVE
Specific Gravity, Urine: 1.011

## 2011-04-22 LAB — MAGNESIUM: Magnesium: 2

## 2011-04-22 LAB — I-STAT 8, (EC8 V) (CONVERTED LAB)
BUN: 32 — ABNORMAL HIGH
Chloride: 109
Glucose, Bld: 95
HCT: 33 — ABNORMAL LOW
Operator id: 294501
pCO2, Ven: 40.6 — ABNORMAL LOW
pH, Ven: 7.366 — ABNORMAL HIGH

## 2011-04-22 LAB — PROTIME-INR: Prothrombin Time: 13.8

## 2011-04-22 LAB — LIPID PANEL
Cholesterol: 100
HDL: 35 — ABNORMAL LOW
Triglycerides: 36

## 2012-02-07 ENCOUNTER — Encounter (HOSPITAL_COMMUNITY): Payer: Self-pay | Admitting: Emergency Medicine

## 2012-02-07 ENCOUNTER — Emergency Department (INDEPENDENT_AMBULATORY_CARE_PROVIDER_SITE_OTHER): Payer: Medicare Other

## 2012-02-07 ENCOUNTER — Emergency Department (HOSPITAL_COMMUNITY)
Admission: EM | Admit: 2012-02-07 | Discharge: 2012-02-07 | Disposition: A | Discharge status: Home / Self Care | Payer: Medicare Other | Attending: Emergency Medicine | Admitting: Emergency Medicine

## 2012-02-07 ENCOUNTER — Emergency Department (HOSPITAL_COMMUNITY)
Admission: EM | Admit: 2012-02-07 | Discharge: 2012-02-07 | Disposition: A | Discharge status: Home / Self Care | Payer: Medicare Other | Source: Home / Self Care | Attending: Emergency Medicine | Admitting: Emergency Medicine

## 2012-02-07 ENCOUNTER — Encounter (HOSPITAL_COMMUNITY): Payer: Self-pay | Admitting: *Deleted

## 2012-02-07 DIAGNOSIS — I2510 Atherosclerotic heart disease of native coronary artery without angina pectoris: Secondary | ICD-10-CM | POA: Insufficient documentation

## 2012-02-07 DIAGNOSIS — W2209XA Striking against other stationary object, initial encounter: Secondary | ICD-10-CM | POA: Insufficient documentation

## 2012-02-07 DIAGNOSIS — S8010XA Contusion of unspecified lower leg, initial encounter: Secondary | ICD-10-CM

## 2012-02-07 DIAGNOSIS — Z7902 Long term (current) use of antithrombotics/antiplatelets: Secondary | ICD-10-CM | POA: Insufficient documentation

## 2012-02-07 DIAGNOSIS — T148XXA Other injury of unspecified body region, initial encounter: Secondary | ICD-10-CM

## 2012-02-07 DIAGNOSIS — I10 Essential (primary) hypertension: Secondary | ICD-10-CM | POA: Insufficient documentation

## 2012-02-07 DIAGNOSIS — E7800 Pure hypercholesterolemia, unspecified: Secondary | ICD-10-CM | POA: Insufficient documentation

## 2012-02-07 DIAGNOSIS — K219 Gastro-esophageal reflux disease without esophagitis: Secondary | ICD-10-CM | POA: Insufficient documentation

## 2012-02-07 HISTORY — DX: Gastro-esophageal reflux disease without esophagitis: K21.9

## 2012-02-07 HISTORY — DX: Pure hypercholesterolemia, unspecified: E78.00

## 2012-02-07 HISTORY — DX: Atherosclerotic heart disease of native coronary artery without angina pectoris: I25.10

## 2012-02-07 HISTORY — DX: Essential (primary) hypertension: I10

## 2012-02-07 MED ORDER — HYDROCODONE-ACETAMINOPHEN 5-325 MG PO TABS
1.0000 | ORAL_TABLET | Freq: Once | ORAL | Status: AC
  Administered 2012-02-07: 1 via ORAL

## 2012-02-07 MED ORDER — HYDROCODONE-ACETAMINOPHEN 5-325 MG PO TABS
ORAL_TABLET | ORAL | Status: AC
  Filled 2012-02-07: qty 1

## 2012-02-07 MED ORDER — HYDROCODONE-ACETAMINOPHEN 5-325 MG PO TABS: 2.0000 | ORAL_TABLET | ORAL | Status: AC | PRN

## 2012-02-07 NOTE — ED Notes (Signed)
Report given to Durwin Reges triage nurse [place] ed pt sent by private vehicle with daughter to be seen by dr Charlann Boxer - dr Charlann Boxer to paged when pt arrives ed - Dr Ladon Applebaum spoke with Dr Charlann Boxer will see pt [facility] ED RIght Leg Injury
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
## 2012-02-07 NOTE — ED Notes (Signed)
D[facility] to [facility] now to be seen by dr Charlann Boxer r/o capartment syndrome - paper scrub pants and no slip socks - daughter verbalized would go directly to [facility] nothing to eat or drink prior to arrival - report to [facility] triage nurse - medicated for pain prior to discharge
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
## 2012-02-07 NOTE — ED Notes (Signed)
Ortho MD at bedside.
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
## 2012-02-07 NOTE — ED Provider Notes (Signed)
Medical screening examination/treatment/procedure(s) were conducted as a shared visit with non-physician practitioner(s) and myself.  I personally evaluated the patient during the encounter  Patient hematoma noted to right lower extremity. Compartment is soft no hematoma noted. Will consult orthopedics to have compartment pressures performed possibly and evaluate. Patient has been seen by orthopedics and no concern for compartment syndrome and patient will be seen tomorrow in followup  Toy Baker, MD 02/07/12 [phone]
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
## 2012-02-09 NOTE — ED Provider Notes (Signed)
Medical screening examination/treatment/procedure(s) were performed by non-physician practitioner and as supervising physician I was immediately available for consultation/collaboration.  Toy Baker, MD 02/09/12 [phone]

## 2012-06-26 ENCOUNTER — Other Ambulatory Visit (HOSPITAL_COMMUNITY): Payer: Self-pay | Admitting: Orthopedic Surgery

## 2012-06-26 ENCOUNTER — Encounter (HOSPITAL_COMMUNITY): Payer: Self-pay | Admitting: Pharmacy Technician

## 2012-06-27 ENCOUNTER — Encounter (HOSPITAL_COMMUNITY)
Admission: RE | Admit: 2012-06-27 | Discharge: 2012-06-27 | Disposition: A | Discharge status: Home / Self Care | Payer: Medicare Other | Source: Ambulatory Visit | Attending: Anesthesiology | Admitting: Anesthesiology

## 2012-06-27 ENCOUNTER — Encounter (HOSPITAL_COMMUNITY): Payer: Self-pay

## 2012-06-27 ENCOUNTER — Encounter (HOSPITAL_COMMUNITY)
Admission: RE | Admit: 2012-06-27 | Discharge: 2012-06-27 | Disposition: A | Discharge status: Home / Self Care | Payer: Medicare Other | Source: Ambulatory Visit | Attending: Orthopedic Surgery | Admitting: Orthopedic Surgery

## 2012-06-27 HISTORY — DX: Unspecified osteoarthritis, unspecified site: M19.90

## 2012-06-27 LAB — CBC
HCT: 33.3 % — ABNORMAL LOW (ref 39.0–52.0)
Hemoglobin: 11 g/dL — ABNORMAL LOW (ref 13.0–17.0)
MCH: 29.3 pg (ref 26.0–34.0)
MCHC: 33 g/dL (ref 30.0–36.0)
RDW: 15.4 % (ref 11.5–15.5)

## 2012-06-27 LAB — PROTIME-INR
INR: 1.02 (ref 0.00–1.49)
Prothrombin Time: 13.3 seconds (ref 11.6–15.2)

## 2012-06-27 LAB — COMPREHENSIVE METABOLIC PANEL
Albumin: 3.3 g/dL — ABNORMAL LOW (ref 3.5–5.2)
BUN: 24 mg/dL — ABNORMAL HIGH (ref 6–23)
Calcium: 9.4 mg/dL (ref 8.4–10.5)
Creatinine, Ser: 1.01 mg/dL (ref 0.50–1.35)
GFR calc Af Amer: 76 mL/min — ABNORMAL LOW (ref >90)
Glucose, Bld: 100 mg/dL — ABNORMAL HIGH (ref 70–99)
Total Protein: 7.7 g/dL (ref 6.0–8.3)

## 2012-06-27 LAB — SURGICAL PCR SCREEN: Staphylococcus aureus: NEGATIVE

## 2012-06-27 MED ORDER — CEFAZOLIN SODIUM-DEXTROSE 2-3 GM-% IV SOLR
2.0000 g | INTRAVENOUS | Status: AC
  Administered 2012-06-28: 2 g via INTRAVENOUS
  Filled 2012-06-27: qty 50

## 2012-06-27 NOTE — Pre-Procedure Instructions (Signed)
20 Larry Huffman  06/27/2012   Your procedure is scheduled on:  Jun 28, 2012  Report to [facility] at 7:50 AM.  Call this number if you have problems the morning of surgery: [phone]   Remember:   Do not eat food:After Midnight.    Take these medicines the morning of surgery with A SIP OF WATER: prilosec, carvedilol(coreg), amlodipine(norvasc), isosorbide(imdur)      STOP ASPIRIN AND PLAVIX AS DIRECTED   Do not wear jewelry, make-up or nail polish.  Do not wear lotions, powders, or perfumes. You may wear deodorant.  Do not shave 48 hours prior to surgery. Men may shave face and neck.  Do not bring valuables to the hospital.  Contacts, dentures or bridgework may not be worn into surgery.  Leave suitcase in the car. After surgery it may be brought to your room.  For patients admitted to the hospital, checkout time is 11:00 AM the day of discharge.   Patients discharged the day of surgery will not be allowed to drive home.  Name and phone number of your driver:   Special Instructions: Incentive Spirometry - Practice and bring it with you on the day of surgery. Shower using CHG 2 nights before surgery and the night before surgery.  If you shower the day of surgery use CHG.  Use special wash - you have one bottle of CHG for all showers.  You should use approximately 1/3 of the bottle for each shower.   Please read over the following fact sheets that you were given: Pain Booklet, Coughing and Deep Breathing and Surgical Site Infection Prevention

## 2012-06-27 NOTE — Progress Notes (Signed)
DOS EKG LAST EKG 05/2011.  PER AUTTUMN AT OFFICE OK THAT PT HAS NOT STOPPED PLAVIX OR ASPIRIN

## 2012-06-28 ENCOUNTER — Inpatient Hospital Stay (HOSPITAL_COMMUNITY)
Admission: RE | Admit: 2012-06-28 | Discharge: 2012-06-30 | DRG: 904 | Disposition: A | Discharge status: Home / Self Care | Payer: Medicare Other | Source: Ambulatory Visit | Attending: Orthopedic Surgery | Admitting: Orthopedic Surgery

## 2012-06-28 ENCOUNTER — Inpatient Hospital Stay (HOSPITAL_COMMUNITY): Payer: Medicare Other | Admitting: Anesthesiology

## 2012-06-28 ENCOUNTER — Encounter (HOSPITAL_COMMUNITY)
Admission: RE | Disposition: A | Discharge status: Home / Self Care | Payer: Self-pay | Source: Ambulatory Visit | Attending: Orthopedic Surgery

## 2012-06-28 ENCOUNTER — Encounter (HOSPITAL_COMMUNITY): Payer: Self-pay | Admitting: *Deleted

## 2012-06-28 ENCOUNTER — Encounter (HOSPITAL_COMMUNITY): Payer: Self-pay | Admitting: Anesthesiology

## 2012-06-28 DIAGNOSIS — S81809A Unspecified open wound, unspecified lower leg, initial encounter: Principal | ICD-10-CM | POA: Diagnosis present

## 2012-06-28 DIAGNOSIS — I10 Essential (primary) hypertension: Secondary | ICD-10-CM | POA: Diagnosis present

## 2012-06-28 DIAGNOSIS — I83009 Varicose veins of unspecified lower extremity with ulcer of unspecified site: Secondary | ICD-10-CM

## 2012-06-28 DIAGNOSIS — L97809 Non-pressure chronic ulcer of other part of unspecified lower leg with unspecified severity: Secondary | ICD-10-CM | POA: Diagnosis present

## 2012-06-28 DIAGNOSIS — Z87891 Personal history of nicotine dependence: Secondary | ICD-10-CM

## 2012-06-28 DIAGNOSIS — L97909 Non-pressure chronic ulcer of unspecified part of unspecified lower leg with unspecified severity: Secondary | ICD-10-CM

## 2012-06-28 DIAGNOSIS — K219 Gastro-esophageal reflux disease without esophagitis: Secondary | ICD-10-CM | POA: Diagnosis present

## 2012-06-28 DIAGNOSIS — S81009A Unspecified open wound, unspecified knee, initial encounter: Principal | ICD-10-CM | POA: Diagnosis present

## 2012-06-28 DIAGNOSIS — X58XXXA Exposure to other specified factors, initial encounter: Secondary | ICD-10-CM | POA: Diagnosis present

## 2012-06-28 DIAGNOSIS — I872 Venous insufficiency (chronic) (peripheral): Secondary | ICD-10-CM | POA: Diagnosis present

## 2012-06-28 DIAGNOSIS — I2510 Atherosclerotic heart disease of native coronary artery without angina pectoris: Secondary | ICD-10-CM | POA: Diagnosis present

## 2012-06-28 HISTORY — PX: I&D EXTREMITY: SHX5045

## 2012-06-28 HISTORY — DX: Peripheral vascular disease, unspecified: I73.9

## 2012-06-28 HISTORY — DX: Sleep apnea, unspecified: G47.30

## 2012-06-28 SURGERY — IRRIGATION AND DEBRIDEMENT EXTREMITY
Anesthesia: General | Site: Leg Lower | Laterality: Right | Wound class: Dirty or Infected

## 2012-06-28 MED ORDER — METOCLOPRAMIDE HCL 10 MG PO TABS: 5.0000 mg | ORAL_TABLET | Freq: Three times a day (TID) | ORAL | Status: DC | PRN

## 2012-06-28 MED ORDER — MIDAZOLAM HCL 2 MG/2ML IJ SOLN: 0.5000 mg | Freq: Once | INTRAMUSCULAR | Status: DC | PRN

## 2012-06-28 MED ORDER — HYDROMORPHONE HCL PF 1 MG/ML IJ SOLN: 0.5000 mg | INTRAMUSCULAR | Status: DC | PRN

## 2012-06-28 MED ORDER — PHENYLEPHRINE HCL 10 MG/ML IJ SOLN
INTRAMUSCULAR | Status: DC | PRN
  Administered 2012-06-28: 80 ug via INTRAVENOUS

## 2012-06-28 MED ORDER — VANCOMYCIN HCL 500 MG IV SOLR
INTRAVENOUS | Status: DC | PRN
  Administered 2012-06-28: 500 mg

## 2012-06-28 MED ORDER — ASPIRIN 325 MG PO TABS
325.0000 mg | ORAL_TABLET | Freq: Every day | ORAL | Status: DC
  Administered 2012-06-28 – 2012-06-30 (×3): 325 mg via ORAL
  Filled 2012-06-28 (×4): qty 1

## 2012-06-28 MED ORDER — ISOSORBIDE MONONITRATE 15 MG HALF TABLET
15.0000 mg | ORAL_TABLET | Freq: Every day | ORAL | Status: DC
  Administered 2012-06-29: 15 mg via ORAL
  Filled 2012-06-28 (×2): qty 1

## 2012-06-28 MED ORDER — PROPOFOL 10 MG/ML IV BOLUS
INTRAVENOUS | Status: DC | PRN
  Administered 2012-06-28: 150 mg via INTRAVENOUS

## 2012-06-28 MED ORDER — ONDANSETRON HCL 4 MG/2ML IJ SOLN: 4.0000 mg | Freq: Four times a day (QID) | INTRAMUSCULAR | Status: DC | PRN

## 2012-06-28 MED ORDER — SODIUM CHLORIDE 0.9 % IV SOLN
INTRAVENOUS | Status: DC
  Administered 2012-06-28: 12:00:00 via INTRAVENOUS

## 2012-06-28 MED ORDER — POLYETHYLENE GLYCOL 3350 17 G PO PACK
17.0000 g | PACK | Freq: Every day | ORAL | Status: DC
  Administered 2012-06-28 – 2012-06-30 (×3): 17 g via ORAL
  Filled 2012-06-28 (×4): qty 1

## 2012-06-28 MED ORDER — LACTATED RINGERS IV SOLN
INTRAVENOUS | Status: DC | PRN
  Administered 2012-06-28: 10:00:00 via INTRAVENOUS

## 2012-06-28 MED ORDER — AMLODIPINE BESYLATE 10 MG PO TABS
10.0000 mg | ORAL_TABLET | Freq: Every day | ORAL | Status: DC
  Administered 2012-06-28 – 2012-06-30 (×3): 10 mg via ORAL
  Filled 2012-06-28 (×4): qty 1

## 2012-06-28 MED ORDER — OXYCODONE-ACETAMINOPHEN 5-325 MG PO TABS
1.0000 | ORAL_TABLET | ORAL | Status: DC | PRN
  Administered 2012-06-29: 1 via ORAL
  Filled 2012-06-28: qty 1

## 2012-06-28 MED ORDER — METOCLOPRAMIDE HCL 5 MG/ML IJ SOLN
5.0000 mg | Freq: Three times a day (TID) | INTRAMUSCULAR | Status: DC | PRN
  Filled 2012-06-28: qty 2

## 2012-06-28 MED ORDER — MEPERIDINE HCL 25 MG/ML IJ SOLN: 6.2500 mg | INTRAMUSCULAR | Status: DC | PRN

## 2012-06-28 MED ORDER — HYDROCODONE-ACETAMINOPHEN 5-325 MG PO TABS: 1.0000 | ORAL_TABLET | ORAL | Status: DC | PRN

## 2012-06-28 MED ORDER — PROMETHAZINE HCL 25 MG/ML IJ SOLN: 6.2500 mg | INTRAMUSCULAR | Status: DC | PRN

## 2012-06-28 MED ORDER — SIMVASTATIN 10 MG PO TABS
10.0000 mg | ORAL_TABLET | Freq: Every day | ORAL | Status: DC
  Administered 2012-06-28 – 2012-06-29 (×2): 10 mg via ORAL
  Filled 2012-06-28 (×3): qty 1

## 2012-06-28 MED ORDER — GENTAMICIN SULFATE 40 MG/ML IJ SOLN
INTRAMUSCULAR | Status: AC
  Filled 2012-06-28: qty 2

## 2012-06-28 MED ORDER — FENTANYL CITRATE 0.05 MG/ML IJ SOLN
25.0000 ug | INTRAMUSCULAR | Status: DC | PRN
Start: 2012-06-28 — End: 2012-06-28
  Administered 2012-06-28: 50 ug via INTRAVENOUS

## 2012-06-28 MED ORDER — ONDANSETRON HCL 4 MG PO TABS: 4.0000 mg | ORAL_TABLET | Freq: Four times a day (QID) | ORAL | Status: DC | PRN

## 2012-06-28 MED ORDER — CEFAZOLIN SODIUM-DEXTROSE 2-3 GM-% IV SOLR
2.0000 g | Freq: Four times a day (QID) | INTRAVENOUS | Status: AC
  Administered 2012-06-28 – 2012-06-29 (×3): 2 g via INTRAVENOUS
  Filled 2012-06-28 (×3): qty 50

## 2012-06-28 MED ORDER — CLOPIDOGREL BISULFATE 75 MG PO TABS
75.0000 mg | ORAL_TABLET | Freq: Every day | ORAL | Status: DC
  Administered 2012-06-28 – 2012-06-30 (×3): 75 mg via ORAL
  Filled 2012-06-28 (×4): qty 1

## 2012-06-28 MED ORDER — SODIUM CHLORIDE 0.9 % IR SOLN
Status: DC | PRN
  Administered 2012-06-28: 3000 mL

## 2012-06-28 MED ORDER — LACTATED RINGERS IV SOLN
INTRAVENOUS | Status: DC
  Administered 2012-06-28: 10:00:00 via INTRAVENOUS

## 2012-06-28 MED ORDER — VANCOMYCIN HCL 500 MG IV SOLR
INTRAVENOUS | Status: AC
  Filled 2012-06-28: qty 500

## 2012-06-28 MED ORDER — NITROGLYCERIN 0.4 MG SL SUBL: 0.4000 mg | SUBLINGUAL_TABLET | SUBLINGUAL | Status: DC | PRN

## 2012-06-28 MED ORDER — CARVEDILOL 3.125 MG PO TABS
3.1250 mg | ORAL_TABLET | Freq: Every day | ORAL | Status: DC
  Administered 2012-06-28 – 2012-06-30 (×3): 3.125 mg via ORAL
  Filled 2012-06-28 (×4): qty 1

## 2012-06-28 MED ORDER — PANTOPRAZOLE SODIUM 40 MG PO TBEC
40.0000 mg | DELAYED_RELEASE_TABLET | Freq: Every day | ORAL | Status: DC
  Administered 2012-06-29 – 2012-06-30 (×2): 40 mg via ORAL
  Filled 2012-06-28 (×2): qty 1

## 2012-06-28 MED ORDER — LIDOCAINE HCL (CARDIAC) 20 MG/ML IV SOLN
INTRAVENOUS | Status: DC | PRN
  Administered 2012-06-28: 50 mg via INTRAVENOUS

## 2012-06-28 MED ORDER — FENTANYL CITRATE 0.05 MG/ML IJ SOLN
INTRAMUSCULAR | Status: DC | PRN
  Administered 2012-06-28: 50 ug via INTRAVENOUS

## 2012-06-28 MED ORDER — GENTAMICIN SULFATE 40 MG/ML IJ SOLN
INTRAMUSCULAR | Status: DC | PRN
  Administered 2012-06-28: 120 mg

## 2012-06-28 SURGICAL SUPPLY — 52 items
500ML CANISTER FOR INFOVAC ×1 IMPLANT
BLADE SURG 10 STRL SS (BLADE) ×2 IMPLANT
BNDG COHESIVE 4X5 TAN STRL (GAUZE/BANDAGES/DRESSINGS) ×1 IMPLANT
BNDG COHESIVE 6X5 TAN STRL LF (GAUZE/BANDAGES/DRESSINGS) ×2 IMPLANT
BNDG GAUZE STRTCH 6 (GAUZE/BANDAGES/DRESSINGS) ×3 IMPLANT
CLOTH BEACON ORANGE TIMEOUT ST (SAFETY) ×2 IMPLANT
COTTON STERILE ROLL (GAUZE/BANDAGES/DRESSINGS) ×1 IMPLANT
COVER SURGICAL LIGHT HANDLE (MISCELLANEOUS) ×2 IMPLANT
CUFF TOURNIQUET SINGLE 18IN (TOURNIQUET CUFF) ×1 IMPLANT
CUFF TOURNIQUET SINGLE 24IN (TOURNIQUET CUFF) IMPLANT
CUFF TOURNIQUET SINGLE 34IN LL (TOURNIQUET CUFF) IMPLANT
CUFF TOURNIQUET SINGLE 44IN (TOURNIQUET CUFF) IMPLANT
DERMACARRIERS GRAFT 1 TO 1.5 (DISPOSABLE) ×2
DRAPE INCISE IOBAN 85X60 (DRAPES) ×1 IMPLANT
DRAPE U-SHAPE 47X51 STRL (DRAPES) ×2 IMPLANT
DRSG ADAPTIC 3X8 NADH LF (GAUZE/BANDAGES/DRESSINGS) ×1 IMPLANT
DRSG MEPITEL 4X7.2 (GAUZE/BANDAGES/DRESSINGS) ×1 IMPLANT
DRSG VAC ATS SM SENSATRAC (GAUZE/BANDAGES/DRESSINGS) ×1 IMPLANT
DURAPREP 26ML APPLICATOR (WOUND CARE) ×2 IMPLANT
ELECT CAUTERY BLADE 6.4 (BLADE) IMPLANT
ELECT REM PT RETURN 9FT ADLT (ELECTROSURGICAL)
ELECTRODE REM PT RTRN 9FT ADLT (ELECTROSURGICAL) IMPLANT
GLOVE BIOGEL PI IND STRL 9 (GLOVE) ×1 IMPLANT
GLOVE BIOGEL PI INDICATOR 9 (GLOVE) ×1
GLOVE SURG ORTHO 9.0 STRL STRW (GLOVE) ×2 IMPLANT
GOWN PREVENTION PLUS XLARGE (GOWN DISPOSABLE) ×2 IMPLANT
GOWN SRG XL XLNG 56XLVL 4 (GOWN DISPOSABLE) ×1 IMPLANT
GOWN STRL NON-REIN XL XLG LVL4 (GOWN DISPOSABLE) ×2
GRAFT DERMACARRIERS 1 TO 1.5 (DISPOSABLE) IMPLANT
HANDPIECE INTERPULSE COAX TIP (DISPOSABLE) ×2
KIT BASIN OR (CUSTOM PROCEDURE TRAY) ×2 IMPLANT
KIT ROOM TURNOVER OR (KITS) ×2 IMPLANT
KIT STIMULAN RAPID CURE 5CC (Orthopedic Implant) ×1 IMPLANT
MANIFOLD NEPTUNE II (INSTRUMENTS) ×2 IMPLANT
MATRIX SURGICAL PSM 7X15CM (Tissue) ×1 IMPLANT
MICROMATRIX 500MG (Tissue) ×2 IMPLANT
NS IRRIG 1000ML POUR BTL (IV SOLUTION) ×2 IMPLANT
PACK ORTHO EXTREMITY (CUSTOM PROCEDURE TRAY) ×2 IMPLANT
PAD ARMBOARD 7.5X6 YLW CONV (MISCELLANEOUS) ×4 IMPLANT
PADDING CAST COTTON 6X4 STRL (CAST SUPPLIES) ×1 IMPLANT
SET HNDPC FAN SPRY TIP SCT (DISPOSABLE) IMPLANT
SOLUTION PARTIC MCRMTRX 500MG (Tissue) IMPLANT
SPONGE GAUZE 4X4 12PLY (GAUZE/BANDAGES/DRESSINGS) ×1 IMPLANT
SPONGE LAP 18X18 X RAY DECT (DISPOSABLE) ×2 IMPLANT
STOCKINETTE IMPERVIOUS 9X36 MD (GAUZE/BANDAGES/DRESSINGS) ×2 IMPLANT
TOWEL OR 17X24 6PK STRL BLUE (TOWEL DISPOSABLE) ×2 IMPLANT
TOWEL OR 17X26 10 PK STRL BLUE (TOWEL DISPOSABLE) ×2 IMPLANT
TUBE ANAEROBIC SPECIMEN COL (MISCELLANEOUS) IMPLANT
TUBE CONNECTING 12X1/4 (SUCTIONS) ×2 IMPLANT
UNDERPAD 30X30 INCONTINENT (UNDERPADS AND DIAPERS) ×2 IMPLANT
WATER STERILE IRR 1000ML POUR (IV SOLUTION) ×2 IMPLANT
YANKAUER SUCT BULB TIP NO VENT (SUCTIONS) ×2 IMPLANT

## 2012-06-28 NOTE — Transfer of Care (Signed)
Immediate Anesthesia Transfer of Care Note  Patient: Larry Huffman  Procedure(s) Performed: Procedure(s) (LRB) with comments: IRRIGATION AND DEBRIDEMENT EXTREMITY (Right) - Debridement Wound Right Leg, VAC, Antibiotic Beads, Apply A-Cell  Patient Location: PACU  Anesthesia Type:General  Level of Consciousness: awake and alert   Airway & Oxygen Therapy: Patient Spontanous Breathing and Patient connected to nasal cannula oxygen  Post-op Assessment: Report given to PACU RN and Post -op Vital signs reviewed and stable  Post vital signs: Reviewed and stable  Complications: No apparent anesthesia complications

## 2012-06-28 NOTE — Op Note (Signed)
OPERATIVE REPORT  DATE OF SURGERY: 06/28/2012  PATIENT:  Larry Huffman,  78 y.o. male  PRE-OPERATIVE DIAGNOSIS:  Traumatic Wound Right Leg with venous stasis insufficiency  POST-OPERATIVE DIAGNOSIS:  traumatic wound right leg with venous stasis insufficiency  PROCEDURE:  Procedure(s): IRRIGATION AND DEBRIDEMENT EXTREMITY  excisional debridement skin soft tissue fascia and muscle. Local tissue rearrangement for wound 9 x 3 cm x 1 cm deep. Application of antibiotic beads with 500 mg vancomycin 160 mg gentamicin with 5 cc of stimulant powder. Application L. xenograft tissue graft 10 x 15 cm. Application of wound VAC over the 9 x 3 cm wound.  SURGEON:  Surgeon(s): Nadara Mustard, MD  ANESTHESIA:   general  EBL:  Minimal ML  SPECIMEN:  No Specimen  TOURNIQUET:  * No tourniquets in log *  PROCEDURE DETAILS: Patient is a 78 year old gentleman with venous stasis insufficiency chronic in both legs. Patient had traumatic injury to the lateral aspect of the right calf he developed a full-thickness necrotic wound. Patient underwent serial debridement in the office serial compressive wraps with wound care in the office. Do to a failure of the conservative treatment patient was to proceed with surgical intervention. Risks and benefits were discussed including infection neurovascular injury nonhealing of the wound need for additional surgery. Patient states he understands and wished to proceed at this time. Description of procedure patient was brought to the operating room and underwent a general anesthetic. After adequate levels of anesthesia were obtained patient's right lower extremity was first scrubbed with Hibiclens dried prepped using DuraPrep and draped into a sterile field. The traumatic wound was ellipsed out into a long football shaped wound which measures 3 x 9 cm. There is necrotic fascia necrotic soft tissue this was excised sharply with a 21 blade knife curet and Cobb elevator as  well as Ronjair. After debridement the wound was irrigated with pulsatile lavage. The skin edges were relaxed to allow for local tissue rearrangement. Antibiotic beads were placed with 5 cc of stimulant beads. The Ace cell xenograft tissue graft was meshed 1-1.5 this was doubled and placed within the wound. The local tissue was then rearranged and the wound closed using 2-0 nylon. The wound was then covered with a wound VAC set to -75 mm of mercury. This had good suction fit. Patient was then extubated taken to the PACU in stable condition.  PLAN OF CARE: Admit to inpatient   PATIENT DISPOSITION:  PACU - hemodynamically stable.   Nadara Mustard, MD 06/28/2012 10:57 AM

## 2012-06-28 NOTE — Anesthesia Preprocedure Evaluation (Signed)
Anesthesia Evaluation  Patient identified by MRN, date of birth, ID band Patient awake    Reviewed: Allergy & Precautions, H&P , Patient's Chart, lab work & pertinent test results, reviewed documented beta blocker date and time   History of Anesthesia Complications Negative for: history of anesthetic complications  Airway Mallampati: II TM Distance: >3 FB Neck ROM: full    Dental No notable dental hx.    Pulmonary neg pulmonary ROS,  breath sounds clear to auscultation  Pulmonary exam normal       Cardiovascular Exercise Tolerance: Good hypertension, + CAD negative cardio ROS  Rhythm:regular Rate:Normal     Neuro/Psych negative neurological ROS  negative psych ROS   GI/Hepatic negative GI ROS, Neg liver ROS, GERD-  Controlled,  Endo/Other  negative endocrine ROS  Renal/GU negative Renal ROS     Musculoskeletal   Abdominal   Peds  Hematology negative hematology ROS (+)   Anesthesia Other Findings   Reproductive/Obstetrics negative OB ROS                           Anesthesia Physical Anesthesia Plan  ASA: III  Anesthesia Plan: General LMA   Post-op Pain Management:    Induction:   Airway Management Planned:   Additional Equipment:   Intra-op Plan:   Post-operative Plan:   Informed Consent: I have reviewed the patients History and Physical, chart, labs and discussed the procedure including the risks, benefits and alternatives for the proposed anesthesia with the patient or authorized representative who has indicated his/her understanding and acceptance.   Dental Advisory Given  Plan Discussed with: CRNA, Surgeon and Anesthesiologist  Anesthesia Plan Comments:         Anesthesia Quick Evaluation

## 2012-06-28 NOTE — Anesthesia Procedure Notes (Signed)
Procedure Name: LMA Insertion Date/Time: 06/28/2012 10:16 AM Performed by: Gwenyth Allegra Pre-anesthesia Checklist: Patient identified, Timeout performed, Emergency Drugs available, Suction available and Patient being monitored Patient Re-evaluated:Patient Re-evaluated prior to inductionOxygen Delivery Method: Circle system utilized Preoxygenation: Pre-oxygenation with 100% oxygen Intubation Type: IV induction LMA: LMA inserted LMA Size: 4.0 Number of attempts: 1 Placement Confirmation: positive ETCO2 and breath sounds checked- equal and bilateral Dental Injury: Teeth and Oropharynx as per pre-operative assessment

## 2012-06-28 NOTE — Preoperative (Signed)
Beta Blockers   Reason not to administer Beta Blockers:Not Applicable 
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
## 2012-06-28 NOTE — Anesthesia Postprocedure Evaluation (Signed)
Anesthesia Post Note  Patient: Larry Huffman  Procedure(s) Performed: Procedure(s) (LRB): IRRIGATION AND DEBRIDEMENT EXTREMITY (Right)  Anesthesia type: GA  Patient location: PACU  Post pain: Pain level controlled  Post assessment: Post-op Vital signs reviewed  Last Vitals:  Filed Vitals:   06/28/12 1102  BP: 165/67  Pulse: 64  Temp: 36.6 C  Resp: 10    Post vital signs: Reviewed  Level of consciousness: sedated  Complications: No apparent anesthesia complications

## 2012-06-28 NOTE — Progress Notes (Signed)
UR completed 

## 2012-06-29 ENCOUNTER — Encounter (HOSPITAL_COMMUNITY): Payer: Self-pay | Admitting: General Practice

## 2012-06-29 NOTE — Progress Notes (Signed)
Patient ID: Larry Huffman, male   DOB: 1924-03-15, 78 y.o.   MRN: [id number] Postoperative day 1 debridement right leg wound application of antibiotic beads application of xenograft tissue graft and application of a wound VAC. Patient has minimal drainage from the wound VAC. We will continue the [facility] until tomorrow evaluate for discharge tomorrow after evaluation of the wound.

## 2012-06-29 NOTE — Progress Notes (Signed)
Physical Therapy Evaluation Patient Details Name: Larry Huffman MRN: [id number] DOB: Dec 07, 1923 Today's Date: 06/29/2012 Time: 4098-1191 PT Time Calculation (min): 26 min  PT Assessment / Plan / Recommendation Clinical Impression  Pt is an 78 y/o male who lives with adult daughter.  Pt is s/p I & D of non-healing wound on R LE.  Pt demonstrates safety and independence with all mobility and presents with no further PT needs.     PT Assessment  Patent does not need any further PT services    Follow Up Recommendations  No PT follow up    Does the patient have the potential to tolerate intense rehabilitation      Barriers to Discharge        Equipment Recommendations       Recommendations for Other Services     Frequency      Precautions / Restrictions Precautions Precautions: None Restrictions Weight Bearing Restrictions: No   Pertinent Vitals/Pain No c/o pain.       Mobility  Bed Mobility Bed Mobility: Supine to Sit;Sit to Supine Supine to Sit: 7: Independent;HOB flat Sit to Supine: 7: Independent;HOB flat Transfers Transfers: Sit to Stand;Stand to Sit Sit to Stand: 7: Independent;From bed Stand to Sit: 7: Independent;To bed Ambulation/Gait Ambulation/Gait Assistance: 5: Supervision Ambulation Distance (Feet): 350 Feet Assistive device: None Ambulation/Gait Assistance Details: carried wound vac for pt.  Gait Pattern: Within Functional Limits Stairs: No    Shoulder Instructions     Exercises     PT Diagnosis:    PT Problem List:   PT Treatment Interventions:     PT Goals    Visit Information  Last PT Received On: 06/29/12 Assistance Needed: +1    Subjective Data  Subjective: agree to PT eval   Prior Functioning  Home Living Lives With: Daughter Available Help at Discharge: Family;Available PRN/intermittently (Daughter works rotating shifts.) Type of Home: House Home Access: Ramped entrance Home Layout: One level Bathroom Shower/Tub:  Walk-in shower;Door [employer] Toilet: Administrator Accessibility: No Home Adaptive Equipment: Wheelchair - manual;Walker - rolling;Straight cane;Bedside commode/3-in-1 Prior Function Level of Independence: Independent Able to Take Stairs?: Yes Driving: Yes Vocation: Retired Musician: No difficulties Dominant Hand: Right    Cognition  Overall Cognitive Status: Appears within functional limits for tasks assessed/performed Arousal/Alertness: Awake/alert Orientation Level: Appears intact for tasks assessed Behavior During Session: [facility] for tasks performed    Extremity/Trunk Assessment Right Upper Extremity Assessment RUE ROM/Strength/Tone: Within functional levels Left Upper Extremity Assessment LUE ROM/Strength/Tone: Within functional levels Right Lower Extremity Assessment RLE ROM/Strength/Tone: Within functional levels Left Lower Extremity Assessment LLE ROM/Strength/Tone: Within functional levels   Balance    End of Session PT - End of Session Equipment Utilized During Treatment: Gait belt Activity Tolerance: Patient tolerated treatment well Patient left: in bed;with call bell/phone within reach;with bed alarm set Nurse Communication: Mobility status  GP     Rose-Marie Hickling 06/29/2012, 6:23 PM Bronte Kropf L. Brinklee Cisse DPT [phone]

## 2012-06-30 NOTE — Discharge Summary (Signed)
Physician Discharge Summary  Patient ID: Larry Huffman MRN: [id number] DOB/AGE: 08/29/1923 78 y.o.  Admit date: 06/28/2012 Discharge date: 06/30/2012  Admission Diagnoses: Venous stasis insufficiency traumatic ulcer right leg  Discharge Diagnoses: Same Active Problems:  * No active hospital problems. *    Discharged Condition: stable  Hospital Course: Patient's hospital course was essentially unremarkable. He underwent excision of the necrotic tissue from the venous stasis ulcer application of antibiotic beads a cell xenograft tissue graft a wound VAC. Postoperatively patient progressed well he was without pain the wound edges were well approximated he did have some mild ischemic changes over the anterior aspect of the wound. A sterile dressing was applied and the plantar followup in the office on Monday or Tuesday. Patient was discharged to home in stable condition.  Consults: None  Significant Diagnostic Studies: labs: Routine labs  Treatments: surgery: See operative note  Discharge Exam: Blood pressure 142/50, pulse 74, temperature 99.9 F (37.7 C), temperature source Oral, resp. rate 16, SpO2 96.00%. Incision/Wound: incision clean dry and intact with some mild ischemic changes over the anterior aspect of the wound.  Disposition: 01-Home or Self Care  Discharge Orders    Future Orders Please Complete By Expires   Diet - low sodium heart healthy      Call MD / Call 911      Comments:   If you experience chest pain or shortness of breath, CALL 911 and be transported to the hospital emergency room.  If you develope a fever above 101 F, pus (white drainage) or increased drainage or redness at the wound, or calf pain, call your surgeon's office.   Constipation Prevention      Comments:   Drink plenty of fluids.  Prune juice may be helpful.  You may use a stool softener, such as Colace (over the counter) 100 mg twice a day.  Use MiraLax (over the counter) for constipation as  needed.   Increase activity slowly as tolerated          Medication List     As of 06/30/2012  6:25 AM    TAKE these medications         acetaminophen 325 MG tablet   Commonly known as: TYLENOL   Take 650 mg by mouth every 4 (four) hours as needed. For pain.      amLODipine 10 MG tablet   Commonly known as: NORVASC   Take 10 mg by mouth daily.      aspirin 325 MG tablet   Take 325 mg by mouth daily.      CALCIUM + D PO   Take 1 tablet by mouth daily.      carvedilol 3.125 MG tablet   Commonly known as: COREG   Take 3.125 mg by mouth daily.      clopidogrel 75 MG tablet   Commonly known as: PLAVIX   Take 75 mg by mouth daily.      doxycycline 100 MG DR capsule   Commonly known as: DORYX   Take 100 mg by mouth daily.      isosorbide mononitrate 30 MG 24 hr tablet   Commonly known as: IMDUR   Take 15 mg by mouth daily.      multivitamin with minerals Tabs   Take 1 tablet by mouth daily.      nitroGLYCERIN 0.4 MG SL tablet   Commonly known as: NITROSTAT   Place 0.4 mg under the tongue every 5 (five) minutes x 3 doses  as needed. For chest pain.      omeprazole 20 MG capsule   Commonly known as: PRILOSEC   Take 20 mg by mouth daily.      polyethylene glycol packet   Commonly known as: MIRALAX / GLYCOLAX   Take 17 g by mouth daily.      PRESCRIPTION MEDICATION   Apply 1 application topically daily as needed. Furosemide 40mg  cream      simvastatin 10 MG tablet   Commonly known as: ZOCOR   Take 10 mg by mouth at bedtime.      vitamin C 1000 MG tablet   Take 1,000 mg by mouth daily.           Follow-up Information    Follow up with Reola Buckles V, MD. In 3 days. (Call for appointment Monday or Tuesday)    Contact information:   [address] [place] [address] [place] [address] [phone]          Signed: Nadara Mustard 06/30/2012, 6:25 AM

## 2012-07-28 ENCOUNTER — Observation Stay (HOSPITAL_COMMUNITY)
Admission: EM | Admit: 2012-07-28 | Discharge: 2012-07-29 | Disposition: A | Discharge status: Home / Self Care | Payer: Medicare Other | Attending: Cardiovascular Disease | Admitting: Cardiovascular Disease

## 2012-07-28 ENCOUNTER — Emergency Department (HOSPITAL_COMMUNITY): Payer: Medicare Other

## 2012-07-28 ENCOUNTER — Encounter (HOSPITAL_COMMUNITY): Payer: Self-pay

## 2012-07-28 DIAGNOSIS — I10 Essential (primary) hypertension: Secondary | ICD-10-CM | POA: Insufficient documentation

## 2012-07-28 DIAGNOSIS — I2510 Atherosclerotic heart disease of native coronary artery without angina pectoris: Secondary | ICD-10-CM | POA: Insufficient documentation

## 2012-07-28 DIAGNOSIS — I739 Peripheral vascular disease, unspecified: Secondary | ICD-10-CM | POA: Insufficient documentation

## 2012-07-28 DIAGNOSIS — R079 Chest pain, unspecified: Principal | ICD-10-CM | POA: Insufficient documentation

## 2012-07-28 DIAGNOSIS — E7800 Pure hypercholesterolemia, unspecified: Secondary | ICD-10-CM | POA: Insufficient documentation

## 2012-07-28 DIAGNOSIS — G4733 Obstructive sleep apnea (adult) (pediatric): Secondary | ICD-10-CM | POA: Insufficient documentation

## 2012-07-28 DIAGNOSIS — K219 Gastro-esophageal reflux disease without esophagitis: Secondary | ICD-10-CM | POA: Insufficient documentation

## 2012-07-28 DIAGNOSIS — Z9861 Coronary angioplasty status: Secondary | ICD-10-CM | POA: Insufficient documentation

## 2012-07-28 DIAGNOSIS — M25519 Pain in unspecified shoulder: Secondary | ICD-10-CM | POA: Insufficient documentation

## 2012-07-28 HISTORY — DX: Malignant (primary) neoplasm, unspecified: C80.1

## 2012-07-28 LAB — CBC WITH DIFFERENTIAL/PLATELET
Basophils Absolute: 0.1 10*3/uL (ref 0.0–0.1)
Eosinophils Absolute: 0.4 10*3/uL (ref 0.0–0.7)
Eosinophils Relative: 5 % (ref 0–5)
Lymphocytes Relative: 37 % (ref 12–46)
MCH: 29.7 pg (ref 26.0–34.0)
MCV: 88.3 fL (ref 78.0–100.0)
Neutrophils Relative %: 43 % (ref 43–77)
Platelets: 209 10*3/uL (ref 150–400)
RDW: 15.2 % (ref 11.5–15.5)
WBC: 8.7 10*3/uL (ref 4.0–10.5)

## 2012-07-28 LAB — BASIC METABOLIC PANEL
Calcium: 9.3 mg/dL (ref 8.4–10.5)
GFR calc non Af Amer: 64 mL/min — ABNORMAL LOW (ref >90)
Potassium: 3.7 mEq/L (ref 3.5–5.1)
Sodium: 138 mEq/L (ref 135–145)

## 2012-07-28 LAB — TROPONIN I: Troponin I: <0.3 ng/mL (ref <0.30)

## 2012-07-28 MED ORDER — SODIUM CHLORIDE 0.9 % IV SOLN
INTRAVENOUS | Status: DC
  Administered 2012-07-28: 19:00:00 via INTRAVENOUS

## 2012-07-28 NOTE — H&P (Signed)
PCP:  [facility]: Dr. Allyson Sabal  Chief Complaint:  Chest pain  HPI: Larry Huffman is a 79 y.o. male   has a past medical history of Hypertension; Acid reflux; High cholesterol; Coronary artery disease; Arthritis; Sleep apnea; Peripheral vascular disease; and Cancer (hx of skin cancer).   Presented with  Left shoulder pain today radiating to the center of his chest and improved after he has been on O2. He has hx of extensive CAD with Hx of mild in stent restenosis per cath 2011. This pain is not similar to the past MI. He have had hx of rotator cuff problems in his left shoulder. In his pain is more reminiscent of that. He was evaluated for this today by his PCP and was sent to ER. In ER his troponin was unremarkable no ischemic changes on his EKG.  Denies any Shortness of breath, no fever no chills, no nausea, no vomiting. Given risk factors hospitalist was asked to admit for observation.  Of note he had had a recent surgery on his right ankle seems it was for hematoma evacuation and have had recent stitches removed. This is currently wrapping he have slight edema of that extremity but states pain is much better and overall this has improved.  Review of Systems:    Pertinent positives include:  chest pain   Constitutional:  No weight loss, night sweats, Fevers, chills, fatigue, weight loss  HEENT:  No headaches, Difficulty swallowing,Tooth/dental problems,Sore throat,  No sneezing, itching, ear ache, nasal congestion, post nasal drip,  Cardio-vascular:  No Orthopnea, PND, anasarca, dizziness, palpitations.no Bilateral lower extremity swelling  GI:  No heartburn, indigestion, abdominal pain, nausea, vomiting, diarrhea, change in bowel habits, loss of appetite, melena, blood in stool, hematemesis Resp:  no shortness of breath at rest. No dyspnea on exertion, No excess mucus, no productive cough, No non-productive cough, No coughing up of blood.No change in color of mucus.No  wheezing. Skin:  no rash or lesions. No jaundice GU:  no dysuria, change in color of urine, no urgency or frequency. No straining to urinate.  No flank pain.  Musculoskeletal:  No joint pain or no joint swelling. No decreased range of motion. No back pain.  Psych:  No change in mood or affect. No depression or anxiety. No memory loss.  Neuro: no localizing neurological complaints, no tingling, no weakness, no double vision, no gait abnormality, no slurred speech, no confusion  Otherwise ROS are negative except for above, 10 systems were reviewed  Past Medical History: Past Medical History  Diagnosis Date  . Hypertension   . Acid reflux   . High cholesterol   . Coronary artery disease   . Arthritis   . Sleep apnea     CPAP  . Peripheral vascular disease   . Cancer hx of skin cancer   Past Surgical History  Procedure Date  . Rotator cuff repair   . Prostate surgery   . Laparotomy   . Coronary stent placement   . Nasal sinus surgery   . Anal fissure repair   . Eye surgery     cataracts  . Irrigation and debridement abscess   . I&d extremity 06/28/2012    Procedure: IRRIGATION AND DEBRIDEMENT EXTREMITY;  Surgeon: Nadara Mustard, MD;  Location: [facility] OR;  Service: Orthopedics;  Laterality: Right;  Debridement Wound Right Leg, VAC, Antibiotic Beads, Apply A-Cell     Medications: Prior to Admission medications   Medication Sig Start Date End Date Taking? Authorizing Provider  amLODipine (NORVASC) 10 MG tablet Take 10 mg by mouth daily.   Yes Historical Provider, MD  Ascorbic Acid (VITAMIN C) 1000 MG tablet Take 1,000 mg by mouth daily.   Yes Historical Provider, MD  aspirin 325 MG tablet Take 325 mg by mouth at bedtime.    Yes Historical Provider, MD  Calcium Carbonate-Vitamin D (CALCIUM + D PO) Take 1 tablet by mouth daily.   Yes Historical Provider, MD  carvedilol (COREG) 3.125 MG tablet Take 3.125 mg by mouth 2 (two) times daily with a meal.    Yes Historical Provider, MD    clopidogrel (PLAVIX) 75 MG tablet Take 75 mg by mouth daily.   Yes Historical Provider, MD  diphenhydramine-acetaminophen (TYLENOL PM) 25-500 MG TABS Take 1 tablet by mouth at bedtime as needed. For pain   Yes Historical Provider, MD  isosorbide mononitrate (IMDUR) 30 MG 24 hr tablet Take 15 mg by mouth daily.   Yes Historical Provider, MD  Multiple Vitamin (MULTIVITAMIN WITH MINERALS) TABS Take 1 tablet by mouth every morning.    Yes Historical Provider, MD  nitroGLYCERIN (NITROSTAT) 0.4 MG SL tablet Place 0.4 mg under the tongue every 5 (five) minutes x 3 doses as needed. For chest pain.   Yes Historical Provider, MD  omeprazole (PRILOSEC) 20 MG capsule Take 20 mg by mouth daily.   Yes Historical Provider, MD  polyethylene glycol (MIRALAX / GLYCOLAX) packet Take 17 g by mouth at bedtime.    Yes Historical Provider, MD  silver sulfADIAZINE (SILVADENE) 1 % cream Apply 1 application topically daily as needed. Apply daily as needed to treat wound infection   Yes Historical Provider, MD  simvastatin (ZOCOR) 10 MG tablet Take 10 mg by mouth at bedtime.   Yes Historical Provider, MD    Allergies:   Allergies  Allergen Reactions  . Dapsone Other (See Comments)    Lowers WBC count?? "made me very sick"    Social History:  Ambulatory   independently   Lives at   Home with family   reports that he quit smoking about 34 years ago. His smoking use included Cigarettes. He has never used smokeless tobacco. He reports that he does not drink alcohol or use illicit drugs.   Family History: family history includes Hypertension in his mother.    Physical Exam: Patient Vitals for the past 24 hrs:  BP Temp Temp src Pulse Resp SpO2  07/28/12 2300 132/50 mmHg - - 55  17  97 %  07/28/12 2227 156/53 mmHg - - 56  16  98 %  07/28/12 2030 150/57 mmHg - - 54  18  99 %  07/28/12 1915 143/59 mmHg - - 54  20  100 %  07/28/12 1845 158/61 mmHg - - 54  17  99 %  07/28/12 1828 177/67 mmHg 97.8 F (36.6 C) Oral  59  14  100 %    1. General:  in No Acute distress 2. Psychological: Alert  and Oriented 3. Head/ENT:   Moist Mucous Membranes                          Head Non traumatic, neck supple                          Normal  Dentition 4. SKIN: normal Skin turgor,  Skin clean Dry and intact no rash 5. Heart: Regular rate and rhythm no Murmur, Rub or  gallop 6. Lungs:  no wheezes occasional mild crackles   7. Abdomen: Soft, non-tender, Non distended 8. Lower extremities: no clubbing, cyanosis, left leg no edema, right lower extremity is wrapped given recent surgery. 9. Neurologically Grossly intact, moving all 4 extremities equally 10. MSK: Normal range of motion  body mass index is unknown because there is no height or weight on file.   Labs on Admission:   [facility] 07/28/12 1934  NA 138  K 3.7  CL 104  CO2 23  GLUCOSE 88  BUN 20  CREATININE 1.03  CALCIUM 9.3  MG --  PHOS --   No results found for this basename: AST:2,ALT:2,ALKPHOS:2,BILITOT:2,PROT:2,ALBUMIN:2 in the last 72 hours No results found for this basename: LIPASE:2,AMYLASE:2 in the last 72 hours  Basename 07/28/12 1934  WBC 8.7  NEUTROABS 3.8  HGB 10.7*  HCT 31.8*  MCV 88.3  PLT 209    Basename 07/28/12 1934  CKTOTAL --  CKMB --  CKMBINDEX --  TROPONINI <0.30   No results found for this basename: TSH,T4TOTAL,FREET3,T3FREE,THYROIDAB in the last 72 hours  The CrCl is unknown because both a height and weight (above a minimum accepted value) are required for this calculation. ABG    Component Value Date/Time   HCO3 23.3 04/07/2007 1524   TCO2 24 04/07/2007 1524   ACIDBASEDEF 2.0 04/07/2007 1524   Other results:  I have pearsonaly reviewed this: ECG REPORT  Rate:59  Rhythm: st degree AV block  ST&T Change: no ischemic changes.   Cultures:    Component Value Date/Time   SDES URINE, RANDOM 04/06/2008 1358   SPECREQUEST NONE 04/06/2008 1358   CULT INSIGNIFICANT GROWTH 04/06/2008 1358   REPTSTATUS  04/07/2008 FINAL 04/06/2008 1358       Radiological Exams on Admission: Dg Chest 2 View  07/28/2012  *RADIOLOGY REPORT*  Clinical Data: Chest pain  CHEST - 2 VIEW  Comparison: Chest radiograph 12/17/2013and CT chest 05/30/2010  Findings: Upper normal heart size.  Atherosclerotic calcification of the transverse aortic arch.  Lungs are well expanded and clear. Negative for pleural effusion or airspace disease.  Mild height loss of a mid thoracic spine vertebral body is stable.  To prior studies.  IMPRESSION: No acute cardiopulmonary disease.  Stable mid thoracic spine compression deformity.   Original Report Authenticated By: Britta Mccreedy, M.D.     Chart has been reviewed  Assessment/Plan 79 year-old gentleman history of coronary artery disease here with atypical chest pain.  Present on Admission:  . Chest pain - - given risk factors will admit, monitor on telemetry, cycle cardiac enzymes, obtain serial ECG. Further risk stratify with lipid panel, hgA1C, obtain TSH. Make sure patient is on Aspirin continue his Plavix. Further treatment based on the currently pending results.   Marland Kitchen CAD (coronary artery disease) - continue home medications including Plavix and Coreg   Prophylaxis: SCDs given recent hematoma   CODE STATUS: DNR/DNI as per patient's wishes  Other plan as per orders.  I have spent a total of 55 min on this admission  Xin Klawitter 07/28/2012, 11:23 PM

## 2012-07-28 NOTE — ED Provider Notes (Signed)
History     CSN: [id number]  Arrival date & time 07/28/12  1820   First MD Initiated Contact with Patient 07/28/12 1831      Chief Complaint  Patient presents with  . Chest Pain    (Consider location/radiation/quality/duration/timing/severity/associated sxs/prior treatment) Patient is a 79 y.o. male presenting with chest pain. The history is provided by the patient.  Chest Pain Pertinent negatives for primary symptoms include no shortness of breath, no abdominal pain, no nausea and no vomiting.  Pertinent negatives for associated symptoms include no numbness and no weakness.    patient states that he was at home doing some housework and began to have pain in his left shoulder. He states it then moved over to his left chest. It was dull. Did not feel like pressure he had with his previous heart attacks. He does not normally get chest pain. He states he has had 2 stents. He went to his primary care Dr. was sent in because of EKG changes. He states his pain is resolved after taking aspirin at the office. No cough. No trouble breathing.  Past Medical History  Diagnosis Date  . Hypertension   . Acid reflux   . High cholesterol   . Coronary artery disease   . Arthritis   . Sleep apnea     CPAP  . Peripheral vascular disease   . Cancer hx of skin cancer    Past Surgical History  Procedure Date  . Rotator cuff repair   . Prostate surgery   . Laparotomy   . Coronary stent placement   . Nasal sinus surgery   . Anal fissure repair   . Eye surgery     cataracts  . Irrigation and debridement abscess   . I&d extremity 06/28/2012    Procedure: IRRIGATION AND DEBRIDEMENT EXTREMITY;  Surgeon: Nadara Mustard, MD;  Location: [facility] OR;  Service: Orthopedics;  Laterality: Right;  Debridement Wound Right Leg, VAC, Antibiotic Beads, Apply A-Cell    Family History  Problem Relation Age of Onset  . Hypertension Mother     History  Substance Use Topics  . Smoking status: Former Smoker   Types: Cigarettes    Quit date: 07/12/1978  . Smokeless tobacco: Never Used  . Alcohol Use: No      Review of Systems  Constitutional: Negative for activity change and appetite change.  HENT: Negative for neck stiffness.   Eyes: Negative for pain.  Respiratory: Negative for chest tightness and shortness of breath.   Cardiovascular: Positive for chest pain. Negative for leg swelling.  Gastrointestinal: Negative for nausea, vomiting, abdominal pain and diarrhea.  Genitourinary: Negative for flank pain.  Musculoskeletal: Negative for back pain.  Skin: Negative for rash.  Neurological: Negative for weakness, numbness and headaches.  Psychiatric/Behavioral: Negative for behavioral problems.    Allergies  Dapsone  Home Medications   Current Outpatient Rx  Name  Route  Sig  Dispense  Refill  . AMLODIPINE BESYLATE 10 MG PO TABS   Oral   Take 10 mg by mouth daily.         Marland Kitchen VITAMIN C 1000 MG PO TABS   Oral   Take 1,000 mg by mouth daily.         . ASPIRIN 325 MG PO TABS   Oral   Take 325 mg by mouth at bedtime.          Marland Kitchen CALCIUM + D PO   Oral   Take 1 tablet by mouth  daily.         Marland Kitchen CARVEDILOL 3.125 MG PO TABS   Oral   Take 3.125 mg by mouth 2 (two) times daily with a meal.          . CLOPIDOGREL BISULFATE 75 MG PO TABS   Oral   Take 75 mg by mouth daily.         Marland Kitchen DIPHENHYDRAMINE-APAP (SLEEP) 25-500 MG PO TABS   Oral   Take 1 tablet by mouth at bedtime as needed. For pain         . ISOSORBIDE MONONITRATE ER 30 MG PO TB24   Oral   Take 15 mg by mouth daily.         . ADULT MULTIVITAMIN W/MINERALS CH   Oral   Take 1 tablet by mouth every morning.          Marland Kitchen NITROGLYCERIN 0.4 MG SL SUBL   Sublingual   Place 0.4 mg under the tongue every 5 (five) minutes x 3 doses as needed. For chest pain.         Marland Kitchen OMEPRAZOLE 20 MG PO CPDR   Oral   Take 20 mg by mouth daily.         Marland Kitchen POLYETHYLENE GLYCOL 3350 PO PACK   Oral   Take 17 g by mouth  at bedtime.          Marland Kitchen SILVER SULFADIAZINE 1 % EX CREA   Topical   Apply 1 application topically daily as needed. Apply daily as needed to treat wound infection         . SIMVASTATIN 10 MG PO TABS   Oral   Take 10 mg by mouth at bedtime.           BP 132/50  Pulse 55  Temp 97.8 F (36.6 C) (Oral)  Resp 17  SpO2 97%  Physical Exam  Nursing note and vitals reviewed. Constitutional: He is oriented to person, place, and time. He appears well-developed and well-nourished.  HENT:  Head: Normocephalic and atraumatic.  Eyes: EOM are normal. Pupils are equal, round, and reactive to light.  Neck: Normal range of motion. Neck supple.  Cardiovascular: Normal rate, regular rhythm and normal heart sounds.   No murmur heard. Pulmonary/Chest: Effort normal and breath sounds normal. He exhibits tenderness.       Mild tenderness left anterior chest wall. No rash. Crepitance or deformity.  Abdominal: Soft. Bowel sounds are normal. He exhibits no distension and no mass. There is no tenderness. There is no rebound and no guarding.  Musculoskeletal: Normal range of motion. He exhibits no edema.  Neurological: He is alert and oriented to person, place, and time. No cranial nerve deficit.  Skin: Skin is warm and dry.  Psychiatric: He has a normal mood and affect.    ED Course  Procedures (including critical care time)  Labs Reviewed  CBC WITH DIFFERENTIAL - Abnormal; Notable for the following:    RBC 3.60 (*)     Hemoglobin 10.7 (*)     HCT 31.8 (*)     Monocytes Relative 14 (*)     Monocytes Absolute 1.2 (*)     All other components within normal limits  BASIC METABOLIC PANEL - Abnormal; Notable for the following:    GFR calc non Af Amer 64 (*)     GFR calc Af Amer 74 (*)     All other components within normal limits  TROPONIN I   Dg Chest 2  View  07/28/2012  *RADIOLOGY REPORT*  Clinical Data: Chest pain  CHEST - 2 VIEW  Comparison: Chest radiograph 12/17/2013and CT chest  05/30/2010  Findings: Upper normal heart size.  Atherosclerotic calcification of the transverse aortic arch.  Lungs are well expanded and clear. Negative for pleural effusion or airspace disease.  Mild height loss of a mid thoracic spine vertebral body is stable.  To prior studies.  IMPRESSION: No acute cardiopulmonary disease.  Stable mid thoracic spine compression deformity.   Original Report Authenticated By: Britta Mccreedy, M.D.      1. Chest pain      Date: 07/28/2012  Rate: 59  Rhythm: sinus bradycardia  QRS Axis: normal  Intervals: PR prolonged  ST/T Wave abnormalities: normal  Conduction Disutrbances:none  Narrative Interpretation: pr slightly prolonged  Old EKG Reviewed: changes noted    MDM  Patient with chest pain. Could be musculoskeletal, however patient does have previous known cardiac disease. Last heart cath was 2 years ago and showed a 60-70% in-stent stenosis. Patient be admitted to medicine for further rule out.        Juliet Rude. Rubin Payor, MD 07/28/12 [phone]

## 2012-07-28 NOTE — ED Notes (Signed)
Per ems- pt from md office, was at home doing housework, started having pain in left shoulder. Pt has hx of left shoulder injury, dismissed pain. Pain began radiating to left chest, pt went to md office, changes noted to ekg Denies sob, n/v, diaphoresis. . HR 53, AV block noted. BP-162/72  20g LAC. Pt received 324asa prior to arrival. No nitro. Pt currently denies any pain.

## 2012-07-29 ENCOUNTER — Encounter (HOSPITAL_COMMUNITY): Payer: Self-pay | Admitting: General Practice

## 2012-07-29 ENCOUNTER — Other Ambulatory Visit (HOSPITAL_COMMUNITY): Payer: Self-pay | Admitting: Physician Assistant

## 2012-07-29 DIAGNOSIS — R079 Chest pain, unspecified: Secondary | ICD-10-CM

## 2012-07-29 DIAGNOSIS — I2510 Atherosclerotic heart disease of native coronary artery without angina pectoris: Secondary | ICD-10-CM

## 2012-07-29 LAB — CBC
MCV: 89.6 fL (ref 78.0–100.0)
Platelets: 212 10*3/uL (ref 150–400)
RBC: 3.55 MIL/uL — ABNORMAL LOW (ref 4.22–5.81)
RDW: 15.4 % (ref 11.5–15.5)
WBC: 8.3 10*3/uL (ref 4.0–10.5)

## 2012-07-29 LAB — COMPREHENSIVE METABOLIC PANEL
Albumin: 3.1 g/dL — ABNORMAL LOW (ref 3.5–5.2)
Alkaline Phosphatase: 59 U/L (ref 39–117)
BUN: 19 mg/dL (ref 6–23)
Chloride: 105 mEq/L (ref 96–112)
Creatinine, Ser: 1.09 mg/dL (ref 0.50–1.35)
GFR calc Af Amer: 69 mL/min — ABNORMAL LOW (ref >90)
Glucose, Bld: 81 mg/dL (ref 70–99)
Total Bilirubin: 0.3 mg/dL (ref 0.3–1.2)
Total Protein: 6.5 g/dL (ref 6.0–8.3)

## 2012-07-29 LAB — TROPONIN I: Troponin I: <0.3 ng/mL (ref <0.30)

## 2012-07-29 LAB — PHOSPHORUS: Phosphorus: 4.2 mg/dL (ref 2.3–4.6)

## 2012-07-29 LAB — MAGNESIUM: Magnesium: 2.3 mg/dL (ref 1.5–2.5)

## 2012-07-29 MED ORDER — ACETAMINOPHEN 500 MG PO TABS
500.0000 mg | ORAL_TABLET | Freq: Every evening | ORAL | Status: DC | PRN
  Filled 2012-07-29: qty 1

## 2012-07-29 MED ORDER — ACETAMINOPHEN 650 MG RE SUPP: 650.0000 mg | Freq: Four times a day (QID) | RECTAL | Status: DC | PRN

## 2012-07-29 MED ORDER — ISOSORBIDE MONONITRATE 15 MG HALF TABLET
15.0000 mg | ORAL_TABLET | Freq: Every day | ORAL | Status: DC
  Administered 2012-07-29: 15 mg via ORAL
  Filled 2012-07-29: qty 1

## 2012-07-29 MED ORDER — SIMVASTATIN 10 MG PO TABS
10.0000 mg | ORAL_TABLET | Freq: Every day | ORAL | Status: DC
  Filled 2012-07-29: qty 1

## 2012-07-29 MED ORDER — POLYETHYLENE GLYCOL 3350 17 G PO PACK
17.0000 g | PACK | Freq: Every day | ORAL | Status: DC
  Filled 2012-07-29: qty 1

## 2012-07-29 MED ORDER — NITROGLYCERIN 0.4 MG SL SUBL: 0.4000 mg | SUBLINGUAL_TABLET | SUBLINGUAL | Status: DC | PRN

## 2012-07-29 MED ORDER — ONDANSETRON HCL 4 MG/2ML IJ SOLN: 4.0000 mg | Freq: Four times a day (QID) | INTRAMUSCULAR | Status: DC | PRN

## 2012-07-29 MED ORDER — AMLODIPINE BESYLATE 10 MG PO TABS: 10.0000 mg | ORAL_TABLET | Freq: Every day | ORAL | Status: DC

## 2012-07-29 MED ORDER — DIPHENHYDRAMINE HCL 25 MG PO CAPS: 25.0000 mg | ORAL_CAPSULE | Freq: Every evening | ORAL | Status: DC | PRN

## 2012-07-29 MED ORDER — CLOPIDOGREL BISULFATE 75 MG PO TABS
75.0000 mg | ORAL_TABLET | Freq: Every day | ORAL | Status: DC
  Administered 2012-07-29: 75 mg via ORAL
  Filled 2012-07-29: qty 1

## 2012-07-29 MED ORDER — ASPIRIN 325 MG PO TABS: 325.0000 mg | ORAL_TABLET | Freq: Every day | ORAL | Status: DC

## 2012-07-29 MED ORDER — ONDANSETRON HCL 4 MG PO TABS: 4.0000 mg | ORAL_TABLET | Freq: Four times a day (QID) | ORAL | Status: DC | PRN

## 2012-07-29 MED ORDER — SILVER SULFADIAZINE 1 % EX CREA
1.0000 "application " | TOPICAL_CREAM | Freq: Every day | CUTANEOUS | Status: DC
  Filled 2012-07-29: qty 85

## 2012-07-29 MED ORDER — DIPHENHYDRAMINE-APAP (SLEEP) 25-500 MG PO TABS
1.0000 | ORAL_TABLET | Freq: Every evening | ORAL | Status: DC | PRN
Start: 2012-07-29 — End: 2012-07-29

## 2012-07-29 MED ORDER — CARVEDILOL 3.125 MG PO TABS
3.1250 mg | ORAL_TABLET | Freq: Two times a day (BID) | ORAL | Status: DC
  Filled 2012-07-29 (×3): qty 1

## 2012-07-29 MED ORDER — SODIUM CHLORIDE 0.9 % IJ SOLN
3.0000 mL | Freq: Two times a day (BID) | INTRAMUSCULAR | Status: DC
  Administered 2012-07-29: 3 mL via INTRAVENOUS

## 2012-07-29 MED ORDER — ACETAMINOPHEN 325 MG PO TABS: 650.0000 mg | ORAL_TABLET | Freq: Four times a day (QID) | ORAL | Status: DC | PRN

## 2012-07-29 MED ORDER — PANTOPRAZOLE SODIUM 40 MG PO TBEC
40.0000 mg | DELAYED_RELEASE_TABLET | Freq: Every day | ORAL | Status: DC
  Administered 2012-07-29: 40 mg via ORAL
  Filled 2012-07-29: qty 1

## 2012-07-29 MED ORDER — DOCUSATE SODIUM 100 MG PO CAPS
100.0000 mg | ORAL_CAPSULE | Freq: Two times a day (BID) | ORAL | Status: DC
  Administered 2012-07-29: 100 mg via ORAL
  Filled 2012-07-29 (×2): qty 1

## 2012-07-29 MED ORDER — HYDROCODONE-ACETAMINOPHEN 5-325 MG PO TABS: 1.0000 | ORAL_TABLET | ORAL | Status: DC | PRN
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
## 2012-07-29 NOTE — Progress Notes (Signed)
DC orders received.  Patient stable with no S/S of distress.  Medication and discharge information reviewed with patient and patient's daughter.  Patient DC home. Buckner, Mitzi Hansen
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
## 2012-08-09 ENCOUNTER — Encounter (HOSPITAL_COMMUNITY): Payer: Medicare Other

## 2012-08-16 ENCOUNTER — Ambulatory Visit (HOSPITAL_COMMUNITY)
Admission: RE | Admit: 2012-08-16 | Discharge: 2012-08-16 | Disposition: A | Discharge status: Home / Self Care | Payer: Medicare Other | Source: Ambulatory Visit | Attending: Physician Assistant | Admitting: Physician Assistant

## 2012-08-16 DIAGNOSIS — I739 Peripheral vascular disease, unspecified: Secondary | ICD-10-CM | POA: Insufficient documentation

## 2012-08-16 DIAGNOSIS — I2510 Atherosclerotic heart disease of native coronary artery without angina pectoris: Secondary | ICD-10-CM | POA: Insufficient documentation

## 2012-08-16 DIAGNOSIS — R079 Chest pain, unspecified: Secondary | ICD-10-CM | POA: Insufficient documentation

## 2012-08-16 DIAGNOSIS — I10 Essential (primary) hypertension: Secondary | ICD-10-CM | POA: Insufficient documentation

## 2012-08-16 MED ORDER — TECHNETIUM TC 99M SESTAMIBI GENERIC - CARDIOLITE
30.0000 | Freq: Once | INTRAVENOUS | Status: AC | PRN
  Administered 2012-08-16: 30 via INTRAVENOUS

## 2012-08-16 MED ORDER — REGADENOSON 0.4 MG/5ML IV SOLN
0.4000 mg | Freq: Once | INTRAVENOUS | Status: AC
  Administered 2012-08-16: 0.4 mg via INTRAVENOUS

## 2012-08-16 MED ORDER — TECHNETIUM TC 99M SESTAMIBI GENERIC - CARDIOLITE
10.0000 | Freq: Once | INTRAVENOUS | Status: AC | PRN
  Administered 2012-08-16: 10 via INTRAVENOUS

## 2012-08-16 MED ORDER — AMINOPHYLLINE 25 MG/ML IV SOLN
75.0000 mg | Freq: Once | INTRAVENOUS | Status: AC
  Administered 2012-08-16: 75 mg via INTRAVENOUS

## 2012-08-16 NOTE — Procedures (Addendum)
[place]  [facility] [address] [place] [address] [phone]  Cardiology Nuclear Med Study  Larry Huffman is a 79 y.o. male     MRN : [id number]     DOB: 01-02-1924  Procedure Date: 08/16/2012  Nuclear Med Background Indication for Stress Test:  Evaluation for Ischemia and Post Hospital History:  CAD Cardiac Risk Factors: History of Smoking, Hypertension, Lipids, Overweight and PVD  Symptoms:  Chest Pain   Nuclear Pre-Procedure Caffeine/Decaff Intake:  1:00am NPO After: 11:00am   IV Site: R Antecubital  IV 0.9% NS with Angio Cath:  22g  Chest Size (in):  44 IV Started by: Koren Shiver, CNMT  Height: 5\' 6"  (1.676 m)  Cup Size: n/a  BMI:  Body mass index is 27.92 kg/(m^2). Weight:  173 lb (78.472 kg)   Tech Comments:  n/a    Nuclear Med Study 1 or 2 day study: 1 day  Stress Test Type:  Lexiscan  Order Authorizing Provider:  Nanetta Batty, MD   Resting Radionuclide: Technetium 17m Sestamibi  Resting Radionuclide Dose: 11.0 mCi   Stress Radionuclide:  Technetium 33m Sestamibi  Stress Radionuclide Dose: 30.3 mCi           Stress Protocol Rest HR: 58 Stress HR: 73  Rest BP: 145/63 Stress BP:147/55  Exercise Time (min): n/a METS: n/a          Dose of Adenosine (mg):  n/a Dose of Lexiscan: 0.4 mg  Dose of Atropine (mg): n/a Dose of Dobutamine: n/a mcg/kg/min (at max HR)  Stress Test Technologist: Ernestene Mention, CCT Nuclear Technologist: Koren Shiver, CNMT   Rest Procedure:  Myocardial perfusion imaging was performed at rest 45 minutes following the intravenous administration of Technetium 8m Sestamibi. Stress Procedure:  The patient received IV Lexiscan 0.4 mg over 15-seconds.  Technetium 37m Sestamibi injected at 30-seconds.  Due to patient's head pain he was given IV Aminophylline 75 mg. Symptom was resolved. There were no significant changes with Lexiscan.  Quantitative spect images were obtained after a 45  minute delay.  Transient Ischemic Dilatation (Normal <1.22):  0.89 Lung/Heart Ratio (Normal <0.45):  0.39 QGS EDV:  94  ml QGS ESV:  37 ml LV Ejection Fraction: 60%  Signed by Koren Shiver, CNMT  PHYSICIAN INTERPRETATION:  Rest ECG: NSR with non-specific ST-T wave changes  Stress ECG: No significant change from baseline ECG  QPS Raw Data Images:  There is interference from nuclear activity from structures below the diaphragm. This does not affect the ability to read the study. Stress Images:  There is a small to moderate sized fixed perfusion defect in the basal to mid inferolateral wall. Rest Images:  Comparison with the stress images reveals no significant change. Subtraction (SDS):  There is a fixed defect that is most consistent with a previous infarction. No reversibility is appreciated. No evidence of ischemia.  Impression Exercise Capacity:  Lexiscan with no exercise. BP Response:  Normal blood pressure response. Clinical Symptoms:  No significant symptoms noted. ECG Impression:  No significant ECG changes with Lexiscan. LV Wall Motion:  Normal LV Function with decreased thickening and mild hypokinesis in the basal to mid inferolateral wall consistent with prior infarction.  Comparison with Prior Nuclear Study: No significant change from previous study.  Previous study have some suggestion of mild per-infarct ischemia, however the current study does not.  Overall Impression:  Low risk stress nuclear study.   Marykay Lex, MD  08/17/2012 2:54 PM

## 2012-11-11 ENCOUNTER — Encounter: Payer: Self-pay | Admitting: Cardiovascular Disease

## 2013-01-23 ENCOUNTER — Encounter (INDEPENDENT_AMBULATORY_CARE_PROVIDER_SITE_OTHER): Payer: Medicare Other | Admitting: *Deleted

## 2013-01-23 DIAGNOSIS — L97909 Non-pressure chronic ulcer of unspecified part of unspecified lower leg with unspecified severity: Secondary | ICD-10-CM

## 2013-01-24 ENCOUNTER — Encounter: Payer: Self-pay | Admitting: Orthopedic Surgery

## 2013-02-05 ENCOUNTER — Telehealth: Payer: Self-pay | Admitting: Cardiovascular Disease

## 2013-02-05 NOTE — Telephone Encounter (Signed)
Mail order company told him to call and let you know his prescription for Clopidogrel 75mg  have expired-[phone]

## 2013-02-06 MED ORDER — PANTOPRAZOLE SODIUM 40 MG PO TBEC: 40.0000 mg | DELAYED_RELEASE_TABLET | Freq: Every day | ORAL | Status: DC

## 2013-02-06 MED ORDER — CLOPIDOGREL BISULFATE 75 MG PO TABS: 75.0000 mg | ORAL_TABLET | Freq: Every day | ORAL | Status: DC

## 2013-02-06 NOTE — Telephone Encounter (Signed)
Returned call.  Pt needs refill and stated pharmacy told him to call.  Pt informed refill will be sent and in future to call pharmacy for refills.  Pt verbalized understanding and agreed w/ plan.  ?Interaction w/ omeprazole and clopidogrel.  Kennon Rounds, Pharmacist notified and advised switch omeprazole to pantoprazole.  Call to pt and informed.  Pt verbalized understanding and agreed w/ plan.  Refill(s) sent to pharmacy.

## 2013-02-12 ENCOUNTER — Telehealth: Payer: Self-pay | Admitting: *Deleted

## 2013-02-12 NOTE — Telephone Encounter (Signed)
Message copied by Marella Bile on Mon Feb 12, 2013  2:23 PM ------      Message from: Runell Gess      Created: Mon Feb 12, 2013  1:53 PM       Patient can have his ingrown toenail fixed. He can stop his aspirin Plavix. His Myoview is negative. 14 and he is at low risk ------

## 2013-02-12 NOTE — Telephone Encounter (Signed)
Clearance letter sent to [facility]

## 2013-02-13 ENCOUNTER — Telehealth: Payer: Self-pay | Admitting: Cardiovascular Disease

## 2013-02-13 MED ORDER — SIMVASTATIN 10 MG PO TABS: 10.0000 mg | ORAL_TABLET | Freq: Every day | ORAL | Status: DC

## 2013-02-13 NOTE — Telephone Encounter (Signed)
Would you call in a prescription for his Simvastatin to [facility] [phone] -Need this until he can get his mail order prescription.Need this today-he is completely out of it.

## 2013-02-13 NOTE — Telephone Encounter (Signed)
Refill(s) sent to pharmacy.  Returned call and pt informed.  Pt verbalized understanding and agreed w/ plan.

## 2013-02-16 ENCOUNTER — Other Ambulatory Visit: Payer: Self-pay | Admitting: *Deleted

## 2013-02-16 MED ORDER — SIMVASTATIN 10 MG PO TABS: 10.0000 mg | ORAL_TABLET | Freq: Every day | ORAL | Status: DC

## 2013-02-16 NOTE — Telephone Encounter (Signed)
Rx was sent to pharmacy electronically. 

## 2013-02-26 ENCOUNTER — Other Ambulatory Visit: Payer: Self-pay | Admitting: Cardiovascular Disease

## 2013-02-26 MED ORDER — NITROGLYCERIN 0.4 MG SL SUBL: 0.4000 mg | SUBLINGUAL_TABLET | SUBLINGUAL | Status: DC | PRN

## 2013-02-26 NOTE — Telephone Encounter (Signed)
Larry Huffman is calling because he has not received his medication from his mail order company. And wanted to know if we have sent his refills. He is completely out of medication and would like to get a refill from the [facility] , but it says no refills has to be authorized by the doctor. Please Call.Marland Kitchen

## 2013-02-26 NOTE — Telephone Encounter (Signed)
Rx was sent to pharmacy electronically. 

## 2013-03-08 ENCOUNTER — Telehealth: Payer: Self-pay | Admitting: Cardiovascular Disease

## 2013-03-08 NOTE — Telephone Encounter (Signed)
Please call her concerning her father prescription.

## 2013-03-13 NOTE — Telephone Encounter (Signed)
Please call-think it is a mix up in his prescriptions.

## 2013-03-14 MED ORDER — SIMVASTATIN 10 MG PO TABS: 10.0000 mg | ORAL_TABLET | Freq: Every day | ORAL | Status: DC

## 2013-03-14 NOTE — Telephone Encounter (Signed)
Returned call to Rosey Bath, pt's daughter.  Stated 90-day refill was sent to [facility] and not [facility].  Informed refill will be sent.  Verbalized understanding.  Refill(s) sent to pharmacy.

## 2013-03-15 ENCOUNTER — Encounter: Payer: Self-pay | Admitting: Physician Assistant

## 2013-03-15 DIAGNOSIS — I872 Venous insufficiency (chronic) (peripheral): Secondary | ICD-10-CM

## 2013-03-15 DIAGNOSIS — G4733 Obstructive sleep apnea (adult) (pediatric): Secondary | ICD-10-CM | POA: Insufficient documentation

## 2013-03-15 DIAGNOSIS — I10 Essential (primary) hypertension: Secondary | ICD-10-CM | POA: Insufficient documentation

## 2013-03-15 DIAGNOSIS — E785 Hyperlipidemia, unspecified: Secondary | ICD-10-CM

## 2013-03-15 DIAGNOSIS — I2510 Atherosclerotic heart disease of native coronary artery without angina pectoris: Secondary | ICD-10-CM

## 2013-03-15 DIAGNOSIS — I739 Peripheral vascular disease, unspecified: Secondary | ICD-10-CM | POA: Insufficient documentation

## 2013-03-16 ENCOUNTER — Encounter: Payer: Self-pay | Admitting: Cardiology

## 2013-03-16 ENCOUNTER — Ambulatory Visit (INDEPENDENT_AMBULATORY_CARE_PROVIDER_SITE_OTHER): Payer: Medicare Other | Admitting: Cardiology

## 2013-03-16 VITALS — BP 152/78 | HR 55 | Ht 67.0 in | Wt 186.0 lb

## 2013-03-16 DIAGNOSIS — I872 Venous insufficiency (chronic) (peripheral): Secondary | ICD-10-CM

## 2013-03-16 DIAGNOSIS — I2510 Atherosclerotic heart disease of native coronary artery without angina pectoris: Secondary | ICD-10-CM

## 2013-03-16 NOTE — Patient Instructions (Addendum)
Your physician recommends that you schedule a follow-up appointment in: 6 months  

## 2013-03-16 NOTE — Assessment & Plan Note (Signed)
No angina 

## 2013-03-16 NOTE — Progress Notes (Signed)
03/16/2013 Larry Huffman   [birth year]  [id number]  Primary Physicia Herb Grays, MD Primary Cardiologist: Dr Allyson Sabal  HPI:  Pleasant, talkative, recently widowed 79 y/o followed by Dr Allyson Sabal. His wife passed in Nov 2013. His daughter now lives with him though he is very independent. He has a history of CAD, S/P CFX DES 7/07, LAD DES 10/08. His last cath was 11/11 and he was treated medically. He had a low risk Myoview in Feb 2014. He is her for a 6 month check up. He denies angina or SOB. He is not on a beta blocker secondary to chronic bradycardia. He had had some leg edema in the past after he had an accident hooking up a trailer. This eventually improved after [facility]   Current Outpatient Prescriptions  Medication Sig Dispense Refill  . amLODipine (NORVASC) 10 MG tablet Take 5 mg by mouth daily.       Marland Kitchen amoxicillin (AMOXIL) 500 MG tablet Take 2,000 mg by mouth. Prior to dental procedures      . Ascorbic Acid (VITAMIN C) 1000 MG tablet Take 1,000 mg by mouth daily.      Marland Kitchen aspirin 325 MG tablet Take 325 mg by mouth at bedtime.       . Calcium Carbonate-Vitamin D (CALCIUM + D PO) Take 1 tablet by mouth daily.      . clopidogrel (PLAVIX) 75 MG tablet Take 1 tablet (75 mg total) by mouth daily.  90 tablet  2  . diphenhydramine-acetaminophen (TYLENOL PM) 25-500 MG TABS Take 1 tablet by mouth at bedtime as needed. For pain      . ferrous sulfate 325 (65 FE) MG tablet Take 325 mg by mouth daily with breakfast.      . isosorbide mononitrate (IMDUR) 30 MG 24 hr tablet Take 15 mg by mouth daily.      . Multiple Vitamin (MULTIVITAMIN WITH MINERALS) TABS Take 1 tablet by mouth every morning.       . Multiple Vitamins-Minerals (VISION FORMULA PO) Take by mouth daily. Lutein, Zeaxanthin, Omega-3      . nitroGLYCERIN (NITROSTAT) 0.4 MG SL tablet Place 1 tablet (0.4 mg total) under the tongue every 5 (five) minutes as needed for chest pain.  25 tablet  6  . Omega-3 Fatty  Acids (FISH OIL) 1200 MG CAPS Take 1 capsule by mouth daily.      Marland Kitchen omeprazole (PRILOSEC) 20 MG capsule Take 20 mg by mouth daily.      . polyethylene glycol (MIRALAX / GLYCOLAX) packet Take 17 g by mouth at bedtime.       . silver sulfADIAZINE (SILVADENE) 1 % cream Apply 1 application topically daily as needed. Apply daily as needed to treat wound infection      . simvastatin (ZOCOR) 10 MG tablet Take 1 tablet (10 mg total) by mouth at bedtime.  90 tablet  2  . tolterodine (DETROL LA) 4 MG 24 hr capsule Take 4 mg by mouth daily.       No current facility-administered medications for this visit.    Allergies  Allergen Reactions  . Dapsone Other (See Comments)    Lowers WBC count?? "made me very sick"    History   Social History  . Marital Status: Married    Spouse Name: N/A    Number of Children: N/A  . Years of Education: N/A   Occupational History  . Not on file.   Social History Main Topics  .  Smoking status: Former Smoker    Types: Cigarettes    Quit date: 07/12/1978  . Smokeless tobacco: Never Used  . Alcohol Use: No  . Drug Use: No  . Sexual Activity:    Other Topics Concern  . Not on file   Social History Narrative  . No narrative on file     Review of Systems: General: negative for chills, fever, night sweats or weight changes.  Cardiovascular: negative for chest pain, dyspnea on exertion, edema, orthopnea, palpitations, paroxysmal nocturnal dyspnea or shortness of breath Dermatological: negative for rash Respiratory: negative for cough or wheezing Urologic: negative for hematuria Abdominal: negative for nausea, vomiting, diarrhea, bright red blood per rectum, melena, or hematemesis Neurologic: negative for visual changes, syncope, or dizziness All other systems reviewed and are otherwise negative except as noted above.    Blood pressure 152/78, pulse 55, height 5\' 7"  (1.702 m), weight 186 lb (84.369 kg).  General appearance: alert, cooperative and no  distress Neck: no carotid bruit and no JVD Lungs: clear to auscultation bilaterally Heart: regular rate and rhythm Extremities: compression stockings in place, no significant edema  EKG NSR, SB, 1st degree AVB  ASSESSMENT AND PLAN:   CAD - CFX DES 7/07, LAD DES 10/08, cath 11/11- medical Rx No angina  Venous insufficiency Much improved, [facility]   PLAN  Same Rx, follow up with Dr Allyson Sabal 6 months. He was on Protonix and Omeprazole and I told him he only needed to take one of those.  [facility] 03/16/2013 3:33 PM

## 2013-03-16 NOTE — Assessment & Plan Note (Signed)
Much improved, [facility]

## 2013-04-01 ENCOUNTER — Telehealth: Payer: Self-pay | Admitting: *Deleted

## 2013-04-01 NOTE — Telephone Encounter (Signed)
Faxed back CPAP order supply.

## 2013-04-16 ENCOUNTER — Other Ambulatory Visit: Payer: Self-pay

## 2013-04-16 MED ORDER — OMEPRAZOLE 20 MG PO CPDR: 20.0000 mg | DELAYED_RELEASE_CAPSULE | Freq: Every day | ORAL | Status: DC

## 2013-04-16 NOTE — Telephone Encounter (Signed)
Rx was sent to pharmacy electronically. 

## 2013-04-24 ENCOUNTER — Telehealth: Payer: Self-pay | Admitting: Cardiovascular Disease

## 2013-04-24 MED ORDER — PANTOPRAZOLE SODIUM 40 MG PO TBEC: 40.0000 mg | DELAYED_RELEASE_TABLET | Freq: Every day | ORAL | Status: DC

## 2013-04-24 NOTE — Telephone Encounter (Signed)
Returned call.  Pt stated he received a leaflet w/ his medication and it said he shouldn't take the medication if he is taking omeprazole.  Pt stated the med is clopidogrel.  Pt informed he is correct and omeprazole will need to be changed to another medication as he needs to take clopidogrel.  Pt has already received 90-day supply from mail order pharmacy and informed our pharmacist will be notified to advise if he can take or not and RN will call back.  Pt verbalized understanding and agreed w/ plan.  Belenda Cruise, PharmD notified and advised pt should NOT take omeprazole w/ clopidogrel and it should be changed to pantoprazole 40 mg daily.    Call to pt and informed.  Pt verbalized understanding and agreed w/ plan.  New Rx sent to [facility] and pt agreed not to take omeprazole.

## 2013-04-24 NOTE — Telephone Encounter (Signed)
Please call-some concerns about side effects of Clopidogrel.

## 2013-08-08 ENCOUNTER — Telehealth: Payer: Self-pay | Admitting: *Deleted

## 2013-08-08 MED ORDER — SIMVASTATIN 10 MG PO TABS: 10.0000 mg | ORAL_TABLET | Freq: Every day | ORAL | Status: DC

## 2013-08-08 MED ORDER — NITROGLYCERIN 0.4 MG SL SUBL: 0.4000 mg | SUBLINGUAL_TABLET | SUBLINGUAL | Status: DC | PRN

## 2013-08-08 MED ORDER — ISOSORBIDE MONONITRATE ER 30 MG PO TB24: 15.0000 mg | ORAL_TABLET | Freq: Every day | ORAL | Status: DC

## 2013-08-08 MED ORDER — AMLODIPINE BESYLATE 10 MG PO TABS: 5.0000 mg | ORAL_TABLET | Freq: Every day | ORAL | Status: DC

## 2013-08-08 MED ORDER — PANTOPRAZOLE SODIUM 40 MG PO TBEC: 40.0000 mg | DELAYED_RELEASE_TABLET | Freq: Every day | ORAL | Status: DC

## 2013-08-08 MED ORDER — CLOPIDOGREL BISULFATE 75 MG PO TABS: 75.0000 mg | ORAL_TABLET | Freq: Every day | ORAL | Status: DC

## 2013-08-08 NOTE — Telephone Encounter (Signed)
Returned call and pt verified x 2.  Pt informed message received and RN asked pt to review meds to make sure list is updated before sending refills.  Meds reviewed and pt informed RN will call number to find out which mail order pharmacy is used and send in refills.  Pt verbalized understanding and agreed w/ plan.  Call to number pt left below.  Informed [facility] uses [facility].  Refill(s) sent to [facility].

## 2013-08-08 NOTE — Telephone Encounter (Signed)
Pt stated that he has a new mail order pharmacy and needs his medication called into this pharmacy [phone] (p). He needs all his medication to be filled. Pt is out of his medication.  JB

## 2013-08-15 ENCOUNTER — Other Ambulatory Visit: Payer: Self-pay | Admitting: Rehabilitation

## 2013-08-15 DIAGNOSIS — M545 Low back pain, unspecified: Secondary | ICD-10-CM

## 2013-08-22 ENCOUNTER — Ambulatory Visit
Admission: RE | Admit: 2013-08-22 | Discharge: 2013-08-22 | Disposition: A | Discharge status: Home / Self Care | Payer: Medicare HMO | Source: Ambulatory Visit | Attending: Rehabilitation | Admitting: Rehabilitation

## 2013-08-22 DIAGNOSIS — M545 Low back pain, unspecified: Secondary | ICD-10-CM

## 2013-09-14 ENCOUNTER — Ambulatory Visit (INDEPENDENT_AMBULATORY_CARE_PROVIDER_SITE_OTHER): Payer: Medicare HMO | Admitting: Cardiovascular Disease

## 2013-09-14 ENCOUNTER — Encounter: Payer: Self-pay | Admitting: Cardiovascular Disease

## 2013-09-14 VITALS — BP 170/66 | HR 58 | Ht 67.0 in | Wt 184.3 lb

## 2013-09-14 DIAGNOSIS — I10 Essential (primary) hypertension: Secondary | ICD-10-CM

## 2013-09-14 DIAGNOSIS — I739 Peripheral vascular disease, unspecified: Secondary | ICD-10-CM

## 2013-09-14 DIAGNOSIS — E785 Hyperlipidemia, unspecified: Secondary | ICD-10-CM

## 2013-09-14 DIAGNOSIS — I2510 Atherosclerotic heart disease of native coronary artery without angina pectoris: Secondary | ICD-10-CM

## 2013-09-14 DIAGNOSIS — R0989 Other specified symptoms and signs involving the circulatory and respiratory systems: Secondary | ICD-10-CM

## 2013-09-14 NOTE — Progress Notes (Signed)
09/14/2013 Larry Huffman   04-26-1924  [id number]  Primary Physician Florina Ou, MD Primary Cardiologist: Lorretta Harp MD [name]   HPI:  The patient returns today for followup. He is an 80 year old mildly overweight, married Caucasian male, father of 2, grandfather to 8 grandchildren who I saw in the office 6 months ago. He has a history of CAD status post LAD and circumflex stenting in the past with drug-eluting stents and subsequent negative Myoview as recently as August 17, 2012. He has had left carotid endarterectomy remotely as well which we follow by duplex ultrasound. This was most recently done this past July and was widely patent. He has normal lower extremity arterial Dopplers and venous Dopplers suggesting venous insufficiency. He has obstructive sleep apnea on CPAP followed by Dr. Claiborne Billings. He saw Dr. Mali Hilty who thought he was a good candidate for endovenous ablation. Since I saw him he again lesioning clinically stable without symptoms of chest pain or shortness of breath.    Current Outpatient Prescriptions  Medication Sig Dispense Refill  . amLODipine (NORVASC) 10 MG tablet Take 0.5 tablets (5 mg total) by mouth daily.  45 tablet  2  . amoxicillin (AMOXIL) 500 MG tablet Take 2,000 mg by mouth. Prior to dental procedures      . Ascorbic Acid (VITAMIN C) 1000 MG tablet Take 1,000 mg by mouth daily.      Marland Kitchen aspirin 325 MG tablet Take 325 mg by mouth at bedtime.       . Calcium Carbonate-Vitamin D (CALCIUM + D PO) Take 1 tablet by mouth daily.      . clopidogrel (PLAVIX) 75 MG tablet Take 1 tablet (75 mg total) by mouth daily.  90 tablet  2  . diphenhydramine-acetaminophen (TYLENOL PM) 25-500 MG TABS Take 1 tablet by mouth at bedtime as needed. For pain      . ferrous sulfate 325 (65 FE) MG tablet Take 325 mg by mouth daily with breakfast.      . isosorbide mononitrate (IMDUR) 30 MG 24 hr tablet Take 0.5 tablets (15 mg total) by mouth daily.  45  tablet  2  . montelukast (SINGULAIR) 10 MG tablet Take 10 mg by mouth at bedtime.      . Multiple Vitamin (MULTIVITAMIN WITH MINERALS) TABS Take 1 tablet by mouth every morning.       . Multiple Vitamins-Minerals (VISION FORMULA PO) Take by mouth daily. Lutein, Zeaxanthin, Omega-3      . nitroGLYCERIN (NITROSTAT) 0.4 MG SL tablet Place 1 tablet (0.4 mg total) under the tongue every 5 (five) minutes as needed for chest pain.  25 tablet  2  . Omega-3 Fatty Acids (FISH OIL) 1200 MG CAPS Take 1 capsule by mouth daily.      . pantoprazole (PROTONIX) 40 MG tablet Take 1 tablet (40 mg total) by mouth daily.  90 tablet  2  . polyethylene glycol (MIRALAX / GLYCOLAX) packet Take 17 g by mouth at bedtime.       . silver sulfADIAZINE (SILVADENE) 1 % cream Apply 1 application topically daily as needed. Apply daily as needed to treat wound infection      . simvastatin (ZOCOR) 10 MG tablet Take 1 tablet (10 mg total) by mouth at bedtime.  90 tablet  2  . tolterodine (DETROL LA) 4 MG 24 hr capsule Take 4 mg by mouth daily.       No current facility-administered medications for this visit.  Allergies  Allergen Reactions  . Dapsone Other (See Comments)    Lowers WBC count?? "made me very sick"    History   Social History  . Marital Status: Married    Spouse Name: N/A    Number of Children: N/A  . Years of Education: N/A   Occupational History  . Not on file.   Social History Main Topics  . Smoking status: Former Smoker    Types: Cigarettes    Quit date: 07/12/1978  . Smokeless tobacco: Never Used  . Alcohol Use: No  . Drug Use: No  . Sexual Activity:    Other Topics Concern  . Not on file   Social History Narrative  . No narrative on file     Review of Systems: General: negative for chills, fever, night sweats or weight changes.  Cardiovascular: negative for chest pain, dyspnea on exertion, edema, orthopnea, palpitations, paroxysmal nocturnal dyspnea or shortness of  breath Dermatological: negative for rash Respiratory: negative for cough or wheezing Urologic: negative for hematuria Abdominal: negative for nausea, vomiting, diarrhea, bright red blood per rectum, melena, or hematemesis Neurologic: negative for visual changes, syncope, or dizziness All other systems reviewed and are otherwise negative except as noted above.    Blood pressure 170/66, pulse 58, height 5\' 7"  (1.702 m), weight 83.598 kg (184 lb 4.8 oz).  General appearance: alert and no distress Neck: no adenopathy, no JVD, supple, symmetrical, trachea midline, thyroid not enlarged, symmetric, no tenderness/mass/nodules and soft left carotid bruit Lungs: clear to auscultation bilaterally Heart: regular rate and rhythm, S1, S2 normal, no murmur, click, rub or gallop Extremities: extremities normal, atraumatic, no cyanosis or edema  EKG sinus bradycardia 58 without ST or T wave changes  ASSESSMENT AND PLAN:   CAD - CFX DES 7/07, LAD DES 10/08, cath 11/11- medical Rx History of LAD and circumflex stenting in the past with drug-eluting stents with a negative Myoview stress test 08/17/12. He denies chest pain or shortness of breath  Essential hypertension Controlled on current medications  HLD (hyperlipidemia) On statin therapy followed by his PCP  Peripheral arterial disease: History of left carotid endarterectomy History of left carotid endarterectomy followed by the ultrasound. This was last checked 2 years ago and was normal. He does have a left carotid bruit. He is neurologically astigmatic. I'm going to recheck carotid Doppler studies      Lorretta Harp MD [facility], [facility] 09/14/2013 2:00 PM

## 2013-09-14 NOTE — Assessment & Plan Note (Signed)
Controlled on current medications 

## 2013-09-14 NOTE — Assessment & Plan Note (Signed)
History of LAD and circumflex stenting in the past with drug-eluting stents with a negative Myoview stress test 08/17/12. He denies chest pain or shortness of breath

## 2013-09-14 NOTE — Assessment & Plan Note (Signed)
History of left carotid endarterectomy followed by the ultrasound. This was last checked 2 years ago and was normal. He does have a left carotid bruit. He is neurologically astigmatic. I'm going to recheck carotid Doppler studies

## 2013-09-14 NOTE — Assessment & Plan Note (Signed)
On statin therapy followed by his PCP 

## 2013-09-14 NOTE — Patient Instructions (Signed)
  We will see you back in follow up in 1 year with Dr Gwenlyn Found.  Dr Gwenlyn Found has ordered carotid dopplers to be done.

## 2013-09-25 ENCOUNTER — Encounter (HOSPITAL_COMMUNITY): Payer: Medicare HMO

## 2013-10-02 ENCOUNTER — Ambulatory Visit (HOSPITAL_COMMUNITY)
Admission: RE | Admit: 2013-10-02 | Discharge: 2013-10-02 | Disposition: A | Discharge status: Home / Self Care | Payer: Medicare HMO | Source: Ambulatory Visit | Attending: Internal Medicine | Admitting: Internal Medicine

## 2013-10-02 DIAGNOSIS — I2510 Atherosclerotic heart disease of native coronary artery without angina pectoris: Secondary | ICD-10-CM | POA: Insufficient documentation

## 2013-10-02 DIAGNOSIS — R0989 Other specified symptoms and signs involving the circulatory and respiratory systems: Secondary | ICD-10-CM | POA: Insufficient documentation

## 2013-10-02 NOTE — Progress Notes (Signed)
Carotid Duplex Completed. °Brianna L Mazza,RVT °

## 2013-10-09 ENCOUNTER — Telehealth: Payer: Self-pay | Admitting: *Deleted

## 2013-10-09 ENCOUNTER — Encounter: Payer: Self-pay | Admitting: *Deleted

## 2013-10-09 DIAGNOSIS — I6529 Occlusion and stenosis of unspecified carotid artery: Secondary | ICD-10-CM

## 2013-10-09 NOTE — Telephone Encounter (Signed)
Message copied by Chauncy Lean on Tue Oct 09, 2013  1:12 PM ------      Message from: Lorretta Harp      Created: Sat Oct 06, 2013  5:18 PM       No change from prior study. Repeat in 12 months. ------

## 2013-10-09 NOTE — Telephone Encounter (Signed)
Order placed for repeat carotid dopplers in 1 year  

## 2013-10-12 ENCOUNTER — Telehealth: Payer: Self-pay | Admitting: Cardiovascular Disease

## 2013-10-12 NOTE — Telephone Encounter (Signed)
Returned call and pt verified x 2.  Pt informed message received and letter mailed on Tuesday.  Results given per letter.  Pt verbalized understanding and agreed w/ plan.

## 2013-10-12 NOTE — Telephone Encounter (Signed)
Calling about a test he had last month and has not heard anything about the results . Please Call    Thanks

## 2013-10-17 ENCOUNTER — Telehealth: Payer: Self-pay | Admitting: Cardiovascular Disease

## 2013-10-17 NOTE — Telephone Encounter (Signed)
Left message for [name] voice mail. RN informed her that cardiac clearance is awaiting to be signed. WiIl informed   Dr Cecil Cobbs- ( clearance is in yellow folder)

## 2013-10-17 NOTE — Telephone Encounter (Signed)
Form signed by Dr Gwenlyn Found and faxed back to [facility]

## 2013-10-17 NOTE — Telephone Encounter (Signed)
She had faxed over a clarence on 3-25 and still have not received it back. He will be having straight local for the surgery. Wants to know if he can stop hi Plavix? If so please instruct them how long.

## 2014-01-03 ENCOUNTER — Telehealth: Payer: Self-pay | Admitting: *Deleted

## 2014-01-03 NOTE — Telephone Encounter (Signed)
Dr Lorin Mercy is requesting cardica clearance for lumbar decompression

## 2014-01-04 NOTE — Telephone Encounter (Signed)
Okay to hold his aspirin and Plavix for his lumbar decompression neurosurgical procedure at low cardiovascular risk

## 2014-01-07 NOTE — Telephone Encounter (Signed)
Form faxed back to [facility] authorizing surgery and to hold ASA and Plavix

## 2014-01-09 ENCOUNTER — Other Ambulatory Visit (HOSPITAL_COMMUNITY): Payer: Self-pay | Admitting: Orthopaedic Surgery

## 2014-01-31 ENCOUNTER — Other Ambulatory Visit (HOSPITAL_COMMUNITY): Payer: Self-pay | Admitting: Orthopaedic Surgery

## 2014-02-01 NOTE — Pre-Procedure Instructions (Signed)
Larry Huffman  02/01/2014   Your procedure is scheduled on:  Friday February 08, 2014 at 7:30 AM.  Report to [facility] Admitting at 5:30 AM.  Call this number if you have problems the morning of surgery: [phone]   Remember:   Do not eat food or drink liquids after midnight.   Take these medicines the morning of surgery with A SIP OF WATER: Amlodipine (Norvasc), Isosorbide (Imdur), Pantoprazole (Protonix). Tolterodine (Detrol LA)   Stop taking any herbals, vitamins, fish oil, motrin, advil, aleve   Do not wear jewelry.  Do not wear lotions, powders, or cologne.   Men may shave face and neck.  Do not bring valuables to the hospital.  [facility] is not responsible for any belongings or valuables.               Contacts, dentures or bridgework may not be worn into surgery.  Leave suitcase in the car. After surgery it may be brought to your room.  For patients admitted to the hospital, discharge time is determined by your treatment team.               Patients discharged the day of surgery will not be allowed to drive home.  Name and phone number of your driver: Family/Friend  Special Instructions: Shower using CHG soap the night before and the morning of your surgery   Please read over the following fact sheets that you were given: Pain Booklet, Coughing and Deep Breathing, MRSA Information and Surgical Site Infection Prevention

## 2014-02-01 NOTE — H&P (Signed)
[facility]   A Division of [employer]   [address] [place], [place] [address] Telephone: [phone]  Fax: [phone]     PATIENT: Larry Huffman, Larry Huffman   MR#: [id number]  DOB: [birth year]       This 80 year old male returns with chronic 2 level severe spinal stenosis with neurogenic claudication symptoms.  He has pain when he stands and pain when he walks.  He states he can walk a block and then starts having increased pain.  When he stands, he has to sit to get relief for 15 minutes.  He can stand for 4-5 minutes and then starts having severe leg pain associated with back pain.  He is here with his daughter today to discuss surgical intervention.  He is followed by Dr. Florina Ou.   CURRENT MEDICATIONS:  His medication list was reviewed from 09/18/2012, computer generated and is unchanged.  He is on Isosorbide Mononitrate ER 30 mg 1/2 tablet p.o. daily, omeprazole 20 mg daily, amlodipine 10 mg daily, clopidogrel 75 mg daily, and simvastatin 10 mg at bedtime.  He uses some polyethylene glycol 1 tablespoon daily for constipation.  He takes multiple vitamins including vitamin C, D, E, K, B6, B12, thiamine, riboflavin, niacin, folic acid, biotin, pantothenic acid, calcium, magnesium, zinc, selenium, copper, manganese, chromium, lycopene, iron, fish oil, omega-3, and one 325 mg aspirin at bedtime.   PAST SURGICAL HISTORY:  Includes rotator cuff surgery, anal fissure surgery, prostate surgery, sinus surgery, and intestinal surgery.  He has had cardiac stents x2.   SOCIAL HISTORY:  The patient is a widow and retired.  He does not smoke or drink.   REVIEW OF SYSTEMS:  Positive for cataracts, a history of DVT, heart disease, and macular degeneration.   PHYSICAL EXAMINATION:  The patient is 5 feet 7 inches and weighs 180 pounds.  He is alert and oriented.  Lungs are clear.  Heart is regular rate and rhythm.  No supraclavicular lymphadenopathy.  Upper and  lower extremity reflexes are 2+ and symmetrical.  He is slow getting from standing to sitting.  Negative straight leg raising 90 degrees.  Hip range of motion is normal.  Distal pulses are palpable.  Anterior tibia, EHL, and gastroc soleus are strong.  Good hip flexion.  Good quad strength.  Normal hamstrings.   RADIOGRAPHS/TESTS:  The MRI scan from 08/22/2013 was reviewed.  This shows severe multifactorial spinal stenosis with neurogenic compression at L3-L4 and L4-L5 which is severe.  There are moderate changes at L2-L3 and mild to moderate lateral recess narrowing at L5-S1.   ASSESSMENT:  Spinal stenosis with neurogenic claudication L3-4 and L4-5   PLAN:  We discussed with the patient and his daughter options.  The patient states he would like to proceed with operative intervention.  He would need to be cleared by Dr. Gwenlyn Found, cardiology.  We discussed with him that this would require an overnight stay due to his age.  We discussed cardiovascular risks and the risks of anesthesia.  The patient states that at this point at the age of 60, "what does he have to lose."  He states his quality of life is severely affected.  He understands that recurrent stenosis can develop over a period of years.  The risks of instability, DVT, PE, and MI were discussed.  He understands that he cannot have chemical prophylaxis after lumbar decompression surgery due to risks of postoperative hematoma when he would be managed with continued aspirin, SCDs, and TEDs.  He could be placed on chemical prophylaxis after a week if he developed symptoms of DVT.  We would normally check a D-dimer 1 week postoperatively.  Questions were listened to and answered.  He will proceed with cardiology preoperative clearance.       Mark C. Lorin Mercy, M.D.    Auto-Authenticated by Thana Farr. Lorin Mercy, M.D.

## 2014-02-04 ENCOUNTER — Encounter (HOSPITAL_COMMUNITY)
Admission: RE | Admit: 2014-02-04 | Discharge: 2014-02-04 | Disposition: A | Discharge status: Home / Self Care | Payer: Medicare HMO | Source: Ambulatory Visit | Attending: Orthopaedic Surgery | Admitting: Orthopaedic Surgery

## 2014-02-04 ENCOUNTER — Encounter (HOSPITAL_COMMUNITY): Payer: Self-pay

## 2014-02-04 DIAGNOSIS — Z01818 Encounter for other preprocedural examination: Secondary | ICD-10-CM | POA: Insufficient documentation

## 2014-02-04 DIAGNOSIS — E785 Hyperlipidemia, unspecified: Secondary | ICD-10-CM | POA: Insufficient documentation

## 2014-02-04 DIAGNOSIS — I2510 Atherosclerotic heart disease of native coronary artery without angina pectoris: Secondary | ICD-10-CM | POA: Insufficient documentation

## 2014-02-04 DIAGNOSIS — Z9861 Coronary angioplasty status: Secondary | ICD-10-CM | POA: Insufficient documentation

## 2014-02-04 HISTORY — DX: Unspecified urinary incontinence: R32

## 2014-02-04 HISTORY — DX: Constipation, unspecified: K59.00

## 2014-02-04 HISTORY — DX: Unspecified hearing loss, unspecified ear: H91.90

## 2014-02-04 LAB — COMPREHENSIVE METABOLIC PANEL
ALT: 17 U/L (ref 0–53)
ANION GAP: 14 (ref 5–15)
AST: 24 U/L (ref 0–37)
Albumin: 3.5 g/dL (ref 3.5–5.2)
Alkaline Phosphatase: 76 U/L (ref 39–117)
BUN: 31 mg/dL — ABNORMAL HIGH (ref 6–23)
CALCIUM: 8.8 mg/dL (ref 8.4–10.5)
CO2: 20 mEq/L (ref 19–32)
CREATININE: 1.29 mg/dL (ref 0.50–1.35)
Chloride: 109 mEq/L (ref 96–112)
GFR calc Af Amer: 56 mL/min — ABNORMAL LOW (ref >90)
GFR, EST NON AFRICAN AMERICAN: 48 mL/min — AB (ref >90)
GLUCOSE: 124 mg/dL — AB (ref 70–99)
Potassium: 4 mEq/L (ref 3.7–5.3)
Sodium: 143 mEq/L (ref 137–147)
Total Bilirubin: 0.3 mg/dL (ref 0.3–1.2)
Total Protein: 7.2 g/dL (ref 6.0–8.3)

## 2014-02-04 LAB — CBC
HCT: 32.8 % — ABNORMAL LOW (ref 39.0–52.0)
Hemoglobin: 10.7 g/dL — ABNORMAL LOW (ref 13.0–17.0)
MCH: 30.7 pg (ref 26.0–34.0)
MCHC: 32.6 g/dL (ref 30.0–36.0)
MCV: 94 fL (ref 78.0–100.0)
Platelets: 235 10*3/uL (ref 150–400)
RBC: 3.49 MIL/uL — AB (ref 4.22–5.81)
RDW: 14.3 % (ref 11.5–15.5)
WBC: 10.3 10*3/uL (ref 4.0–10.5)

## 2014-02-04 LAB — PROTIME-INR
INR: 1.04 (ref 0.00–1.49)
PROTHROMBIN TIME: 13.6 s (ref 11.6–15.2)

## 2014-02-04 LAB — SURGICAL PCR SCREEN
MRSA, PCR: NEGATIVE
Staphylococcus aureus: NEGATIVE

## 2014-02-04 LAB — APTT: aPTT: 32 seconds (ref 24–37)

## 2014-02-04 NOTE — Progress Notes (Signed)
Patient is very Larry Huffman despite of wearing bilateral hearing aids. PCP is Physiological scientist and Cardiologist is Quay Burow. Patient informed Nurse that he had a stress test and cardiac cath and has two stents placed in his heart. Nurse attempted to review medications but patient stated he had them all in a bag but left them on his table at home. Nurse called Pharmacy Tech and provided patient will pharmacy tech's number. Patient was instructed to call number given so that medications could be verified. Patient verbalized understanding. When asked about taking a blood thinner, patient stated "I take a blood thinner but I was told to stop it today." Patient stated his daughter lives with him and helps him out at home.

## 2014-02-05 NOTE — Progress Notes (Signed)
Anesthesia Chart Review: Patient is a 80 year old male scheduled for L3-4, L4-5 decompression on 02/08/14 by Dr. Lorin Mercy.   History includes CAD s/p DES CX 01/2006 and DES LAD 10/08, HTN, HLD, carotid occlusive disease s/p left CEA, OSA on CPAP,  hard of hearing (despite hearing aids), skin cancer, urinary incontinence, arthritis, acid reflux, colon surgery, prostate surgeries, nasal sinus surgery. PCP is Dr. Florina Ou.  Cardiologist is Dr. Quay Burow.  Dr. Gwenlyn Found cleared him with low CV risk and with permission to hold ASA and Plavix preoperatively.  He reported being told to stop his "blood thinner" on 02/04/14.  Nuclear stress test on 08/17/12 showed: Impression: Exercise Capacity: Lexiscan with no exercise. BP Response: Normal blood pressure response. Clinical Symptoms: No significant symptoms noted. ECG Impression: No significant ECG changes with Lexiscan. LV Wall Motion: Normal LV Function with decreased thickening and mild hypokinesis in the basal to mid inferolateral wall consistent with prior infarction. LVEF 60%. Comparison with Prior Nuclear Study: No significant change from previous study. Previous study have some suggestion of mild per-infarct ischemia, however the current study does not. Overall Impression: Low risk stress nuclear study.  Cardiac cath on 05/29/10 showed: Very short LM with no significant disease and almost separate osseous to the LAD and CX arteries. Moderate size LAD giving rise to equal size DIAG. Proximal LAD stent is widely patent with roughly 40-50% lesion just after the stent in the midsection. Large caliber, dominant CX giving rise to a large branching OM. After which courses through the AV groove, giving rise to a PDA and PLA branches. Proximal CX with 50-70% in-stent stenosis. 50-70% AV groove. RCA is a small non-dominant vessel with minimal luminal irregularities. LVED 55-60%. He then underwent selective left cardiac angiography on 06/02/10 that showed: Two  sequential non-obstructive lesions in the LCX system by fractional flow reserve. Thre previously recorded lesions downgrades to 50-60% lesion in the AV groove portion and 50% in-stent stenosis.No targets amendable to percutaneous intervention. Medical therapy recommended.  Echo on 03/07/08 showed: Mild concentric LVH, normal LV systolic function , EF => 55%. Doppler flow pattern suggests impaired LV relaxation. Moderate aortic valve sclerosis without stenosis. Discrete nodular thickening of the non-coronary cusp. Mild AR. LA mildly dilated. Mild mitral annular calcification. Aortic root sclerosis/calcification. RVSP is normal.  EKG on 09/14/13 showed: SB @ 58 bpm, first degree AVB, moderate voltage criteria for LVH.   Carotid duplex on 10/02/13 showed: 0-49% RICA stenosis (velocities suggest upper end of range), left CEA/ICA demonstrates normal patency with no evidence of diameter reduction.  Preoperative CXR and labs noted.    He has been cleared by cardiology. If no acute changes then I would anticipate that he could proceed as planned.  George Hugh [facility] Short Stay Center/Anesthesiology Phone [phone] 02/05/2014 10:54 AM

## 2014-02-07 MED ORDER — CEFAZOLIN SODIUM-DEXTROSE 2-3 GM-% IV SOLR
2.0000 g | INTRAVENOUS | Status: AC
  Administered 2014-02-08: 2 g via INTRAVENOUS
  Filled 2014-02-07: qty 50

## 2014-02-08 ENCOUNTER — Ambulatory Visit (HOSPITAL_COMMUNITY): Payer: Medicare HMO

## 2014-02-08 ENCOUNTER — Observation Stay (HOSPITAL_COMMUNITY)
Admission: RE | Admit: 2014-02-08 | Discharge: 2014-02-09 | Disposition: A | Discharge status: Home / Self Care | Payer: Medicare HMO | Source: Ambulatory Visit | Attending: Orthopaedic Surgery | Admitting: Orthopaedic Surgery

## 2014-02-08 ENCOUNTER — Encounter (HOSPITAL_COMMUNITY): Payer: Medicare HMO | Admitting: Vascular Surgery

## 2014-02-08 ENCOUNTER — Encounter (HOSPITAL_COMMUNITY)
Admission: RE | Disposition: A | Discharge status: Home / Self Care | Payer: Self-pay | Source: Ambulatory Visit | Attending: Orthopaedic Surgery

## 2014-02-08 ENCOUNTER — Encounter (HOSPITAL_COMMUNITY): Payer: Self-pay | Admitting: *Deleted

## 2014-02-08 ENCOUNTER — Ambulatory Visit (HOSPITAL_COMMUNITY): Payer: Medicare HMO | Admitting: Anesthesiology

## 2014-02-08 DIAGNOSIS — Z7902 Long term (current) use of antithrombotics/antiplatelets: Secondary | ICD-10-CM | POA: Insufficient documentation

## 2014-02-08 DIAGNOSIS — Z7982 Long term (current) use of aspirin: Secondary | ICD-10-CM | POA: Insufficient documentation

## 2014-02-08 DIAGNOSIS — K219 Gastro-esophageal reflux disease without esophagitis: Secondary | ICD-10-CM | POA: Insufficient documentation

## 2014-02-08 DIAGNOSIS — I739 Peripheral vascular disease, unspecified: Secondary | ICD-10-CM | POA: Insufficient documentation

## 2014-02-08 DIAGNOSIS — M48062 Spinal stenosis, lumbar region with neurogenic claudication: Secondary | ICD-10-CM | POA: Diagnosis present

## 2014-02-08 DIAGNOSIS — I2510 Atherosclerotic heart disease of native coronary artery without angina pectoris: Secondary | ICD-10-CM | POA: Diagnosis not present

## 2014-02-08 DIAGNOSIS — I10 Essential (primary) hypertension: Secondary | ICD-10-CM | POA: Diagnosis not present

## 2014-02-08 DIAGNOSIS — Z87891 Personal history of nicotine dependence: Secondary | ICD-10-CM | POA: Insufficient documentation

## 2014-02-08 DIAGNOSIS — Z86718 Personal history of other venous thrombosis and embolism: Secondary | ICD-10-CM | POA: Diagnosis not present

## 2014-02-08 DIAGNOSIS — G4730 Sleep apnea, unspecified: Secondary | ICD-10-CM | POA: Insufficient documentation

## 2014-02-08 HISTORY — PX: LUMBAR LAMINECTOMY: SHX95

## 2014-02-08 SURGERY — MICRODISCECTOMY LUMBAR LAMINECTOMY
Anesthesia: General | Site: Back

## 2014-02-08 MED ORDER — HYDROCODONE-ACETAMINOPHEN 5-325 MG PO TABS
1.0000 | ORAL_TABLET | ORAL | Status: DC | PRN
  Administered 2014-02-09: 1 via ORAL
  Filled 2014-02-08: qty 1

## 2014-02-08 MED ORDER — AMLODIPINE BESYLATE 10 MG PO TABS
10.0000 mg | ORAL_TABLET | Freq: Once | ORAL | Status: AC
  Administered 2014-02-08: 10 mg via ORAL
  Filled 2014-02-08: qty 1

## 2014-02-08 MED ORDER — ANTIPYRINE-BENZOCAINE 5.4-1.4 % OT SOLN
3.0000 [drp] | OTIC | Status: DC | PRN
  Filled 2014-02-08: qty 10

## 2014-02-08 MED ORDER — OXYCODONE HCL 5 MG/5ML PO SOLN: 5.0000 mg | Freq: Once | ORAL | Status: DC | PRN

## 2014-02-08 MED ORDER — GLYCOPYRROLATE 0.2 MG/ML IJ SOLN
INTRAMUSCULAR | Status: DC | PRN
  Administered 2014-02-08 (×3): 0.2 mg via INTRAVENOUS
  Administered 2014-02-08: 0.4 mg via INTRAVENOUS

## 2014-02-08 MED ORDER — FENTANYL CITRATE 0.05 MG/ML IJ SOLN
INTRAMUSCULAR | Status: DC | PRN
  Administered 2014-02-08: 25 ug via INTRAVENOUS
  Administered 2014-02-08 (×2): 50 ug via INTRAVENOUS

## 2014-02-08 MED ORDER — ARTIFICIAL TEARS OP OINT
TOPICAL_OINTMENT | OPHTHALMIC | Status: AC
  Filled 2014-02-08: qty 3.5

## 2014-02-08 MED ORDER — FERROUS SULFATE 325 (65 FE) MG PO TABS
325.0000 mg | ORAL_TABLET | Freq: Every day | ORAL | Status: DC
  Administered 2014-02-09: 325 mg via ORAL
  Filled 2014-02-08 (×2): qty 1

## 2014-02-08 MED ORDER — SODIUM CHLORIDE 0.9 % IV SOLN: 250.0000 mL | INTRAVENOUS | Status: DC

## 2014-02-08 MED ORDER — ACETAMINOPHEN 325 MG PO TABS
650.0000 mg | ORAL_TABLET | ORAL | Status: DC | PRN
Start: 2014-02-08 — End: 2014-02-09

## 2014-02-08 MED ORDER — KCL IN DEXTROSE-NACL 20-5-0.45 MEQ/L-%-% IV SOLN
INTRAVENOUS | Status: DC
  Administered 2014-02-08 – 2014-02-09 (×2): via INTRAVENOUS
  Filled 2014-02-08 (×3): qty 1000

## 2014-02-08 MED ORDER — FENTANYL CITRATE 0.05 MG/ML IJ SOLN
25.0000 ug | INTRAMUSCULAR | Status: DC | PRN
  Administered 2014-02-08 (×2): 25 ug via INTRAVENOUS

## 2014-02-08 MED ORDER — THROMBIN 5000 UNITS EX SOLR
CUTANEOUS | Status: DC | PRN
  Administered 2014-02-08: 5000 [IU] via TOPICAL

## 2014-02-08 MED ORDER — DEXTROSE 5 % IV SOLN
500.0000 mg | Freq: Four times a day (QID) | INTRAVENOUS | Status: DC | PRN
  Filled 2014-02-08: qty 5

## 2014-02-08 MED ORDER — BUPIVACAINE HCL (PF) 0.25 % IJ SOLN
INTRAMUSCULAR | Status: DC | PRN
  Administered 2014-02-08: 10 mL

## 2014-02-08 MED ORDER — ARTIFICIAL TEARS OP OINT
TOPICAL_OINTMENT | OPHTHALMIC | Status: DC | PRN
  Administered 2014-02-08: 1 via OPHTHALMIC

## 2014-02-08 MED ORDER — LACTATED RINGERS IV SOLN
INTRAVENOUS | Status: DC | PRN
  Administered 2014-02-08 (×2): via INTRAVENOUS

## 2014-02-08 MED ORDER — PANTOPRAZOLE SODIUM 40 MG PO TBEC
40.0000 mg | DELAYED_RELEASE_TABLET | Freq: Every day | ORAL | Status: DC
  Administered 2014-02-08 – 2014-02-09 (×2): 40 mg via ORAL
  Filled 2014-02-08 (×2): qty 1

## 2014-02-08 MED ORDER — SIMVASTATIN 10 MG PO TABS
10.0000 mg | ORAL_TABLET | Freq: Every day | ORAL | Status: DC
  Administered 2014-02-08: 10 mg via ORAL
  Filled 2014-02-08 (×2): qty 1

## 2014-02-08 MED ORDER — PROPOFOL 10 MG/ML IV BOLUS
INTRAVENOUS | Status: AC
  Filled 2014-02-08: qty 20

## 2014-02-08 MED ORDER — PHENYLEPHRINE 40 MCG/ML (10ML) SYRINGE FOR IV PUSH (FOR BLOOD PRESSURE SUPPORT)
PREFILLED_SYRINGE | INTRAVENOUS | Status: AC
  Filled 2014-02-08: qty 10

## 2014-02-08 MED ORDER — ACETAMINOPHEN 650 MG RE SUPP: 650.0000 mg | RECTAL | Status: DC | PRN

## 2014-02-08 MED ORDER — 0.9 % SODIUM CHLORIDE (POUR BTL) OPTIME
TOPICAL | Status: DC | PRN
  Administered 2014-02-08: 1000 mL

## 2014-02-08 MED ORDER — OXYCODONE-ACETAMINOPHEN 5-325 MG PO TABS: 1.0000 | ORAL_TABLET | ORAL | Status: DC | PRN

## 2014-02-08 MED ORDER — FENTANYL CITRATE 0.05 MG/ML IJ SOLN
INTRAMUSCULAR | Status: AC
  Filled 2014-02-08: qty 5

## 2014-02-08 MED ORDER — KETOROLAC TROMETHAMINE 30 MG/ML IJ SOLN
INTRAMUSCULAR | Status: AC
  Filled 2014-02-08: qty 1

## 2014-02-08 MED ORDER — FESOTERODINE FUMARATE ER 8 MG PO TB24
8.0000 mg | ORAL_TABLET | Freq: Every day | ORAL | Status: DC
  Administered 2014-02-09: 8 mg via ORAL
  Filled 2014-02-08: qty 1

## 2014-02-08 MED ORDER — ONDANSETRON HCL 4 MG/2ML IJ SOLN
4.0000 mg | INTRAMUSCULAR | Status: DC | PRN
Start: 2014-02-08 — End: 2014-02-09

## 2014-02-08 MED ORDER — ONDANSETRON HCL 4 MG/2ML IJ SOLN
INTRAMUSCULAR | Status: AC
  Filled 2014-02-08: qty 2

## 2014-02-08 MED ORDER — NEOSTIGMINE METHYLSULFATE 10 MG/10ML IV SOLN
INTRAVENOUS | Status: AC
  Filled 2014-02-08: qty 1

## 2014-02-08 MED ORDER — ONDANSETRON HCL 4 MG/2ML IJ SOLN
INTRAMUSCULAR | Status: DC | PRN
  Administered 2014-02-08: 4 mg via INTRAVENOUS

## 2014-02-08 MED ORDER — NEOSTIGMINE METHYLSULFATE 10 MG/10ML IV SOLN
INTRAVENOUS | Status: DC | PRN
  Administered 2014-02-08: 3 mg via INTRAVENOUS

## 2014-02-08 MED ORDER — GLYCOPYRROLATE 0.2 MG/ML IJ SOLN
INTRAMUSCULAR | Status: AC
  Filled 2014-02-08: qty 2

## 2014-02-08 MED ORDER — POLYETHYLENE GLYCOL 3350 17 G PO PACK
17.0000 g | PACK | Freq: Every day | ORAL | Status: DC
  Administered 2014-02-08: 17 g via ORAL
  Filled 2014-02-08 (×2): qty 1

## 2014-02-08 MED ORDER — ROCURONIUM BROMIDE 50 MG/5ML IV SOLN
INTRAVENOUS | Status: AC
  Filled 2014-02-08: qty 1

## 2014-02-08 MED ORDER — OXYCODONE HCL 5 MG PO TABS: 5.0000 mg | ORAL_TABLET | Freq: Once | ORAL | Status: DC | PRN

## 2014-02-08 MED ORDER — ASPIRIN EC 81 MG PO TBEC
81.0000 mg | DELAYED_RELEASE_TABLET | Freq: Every day | ORAL | Status: DC
  Administered 2014-02-09: 81 mg via ORAL
  Filled 2014-02-08: qty 1

## 2014-02-08 MED ORDER — AMLODIPINE BESYLATE 10 MG PO TABS
10.0000 mg | ORAL_TABLET | Freq: Every day | ORAL | Status: DC
  Administered 2014-02-09: 10 mg via ORAL
  Filled 2014-02-08: qty 1

## 2014-02-08 MED ORDER — FLUTICASONE PROPIONATE 50 MCG/ACT NA SUSP
1.0000 | Freq: Every day | NASAL | Status: DC
  Administered 2014-02-09: 1 via NASAL
  Filled 2014-02-08: qty 16

## 2014-02-08 MED ORDER — DEXAMETHASONE SODIUM PHOSPHATE 4 MG/ML IJ SOLN
INTRAMUSCULAR | Status: DC | PRN
  Administered 2014-02-08: 4 mg via INTRAVENOUS

## 2014-02-08 MED ORDER — FESOTERODINE FUMARATE ER 8 MG PO TB24
8.0000 mg | ORAL_TABLET | Freq: Once | ORAL | Status: AC
  Administered 2014-02-08: 8 mg via ORAL
  Filled 2014-02-08 (×2): qty 1

## 2014-02-08 MED ORDER — PROPOFOL 10 MG/ML IV BOLUS
INTRAVENOUS | Status: DC | PRN
  Administered 2014-02-08: 120 mg via INTRAVENOUS

## 2014-02-08 MED ORDER — SODIUM CHLORIDE 0.9 % IJ SOLN: 3.0000 mL | INTRAMUSCULAR | Status: DC | PRN

## 2014-02-08 MED ORDER — ROCURONIUM BROMIDE 100 MG/10ML IV SOLN
INTRAVENOUS | Status: DC | PRN
  Administered 2014-02-08: 40 mg via INTRAVENOUS

## 2014-02-08 MED ORDER — MENTHOL 3 MG MT LOZG: 1.0000 | LOZENGE | OROMUCOSAL | Status: DC | PRN

## 2014-02-08 MED ORDER — BUPIVACAINE HCL (PF) 0.25 % IJ SOLN
INTRAMUSCULAR | Status: AC
  Filled 2014-02-08: qty 30

## 2014-02-08 MED ORDER — CLOPIDOGREL BISULFATE 75 MG PO TABS
75.0000 mg | ORAL_TABLET | Freq: Every day | ORAL | Status: DC
  Administered 2014-02-09: 75 mg via ORAL
  Filled 2014-02-08: qty 1

## 2014-02-08 MED ORDER — NITROGLYCERIN 0.4 MG SL SUBL: 0.4000 mg | SUBLINGUAL_TABLET | SUBLINGUAL | Status: DC | PRN

## 2014-02-08 MED ORDER — EPHEDRINE SULFATE 50 MG/ML IJ SOLN
INTRAMUSCULAR | Status: AC
  Filled 2014-02-08: qty 1

## 2014-02-08 MED ORDER — SODIUM CHLORIDE 0.9 % IJ SOLN: 3.0000 mL | Freq: Two times a day (BID) | INTRAMUSCULAR | Status: DC

## 2014-02-08 MED ORDER — METHOCARBAMOL 500 MG PO TABS: 500.0000 mg | ORAL_TABLET | Freq: Four times a day (QID) | ORAL | Status: DC | PRN

## 2014-02-08 MED ORDER — FLEET ENEMA 7-19 GM/118ML RE ENEM: 1.0000 | ENEMA | Freq: Once | RECTAL | Status: AC | PRN

## 2014-02-08 MED ORDER — PHENOL 1.4 % MT LIQD: 1.0000 | OROMUCOSAL | Status: DC | PRN

## 2014-02-08 MED ORDER — BISACODYL 10 MG RE SUPP: 10.0000 mg | Freq: Every day | RECTAL | Status: DC | PRN

## 2014-02-08 MED ORDER — SUCCINYLCHOLINE CHLORIDE 20 MG/ML IJ SOLN
INTRAMUSCULAR | Status: AC
  Filled 2014-02-08: qty 1

## 2014-02-08 MED ORDER — ONDANSETRON HCL 4 MG/2ML IJ SOLN: 4.0000 mg | Freq: Once | INTRAMUSCULAR | Status: DC | PRN

## 2014-02-08 MED ORDER — KETOROLAC TROMETHAMINE 30 MG/ML IJ SOLN
30.0000 mg | Freq: Once | INTRAMUSCULAR | Status: AC
  Administered 2014-02-08: 30 mg via INTRAVENOUS

## 2014-02-08 MED ORDER — HEMOSTATIC AGENTS (NO CHARGE) OPTIME
TOPICAL | Status: DC | PRN
  Administered 2014-02-08: 1 via TOPICAL

## 2014-02-08 MED ORDER — FENTANYL CITRATE 0.05 MG/ML IJ SOLN
INTRAMUSCULAR | Status: AC
  Filled 2014-02-08: qty 2

## 2014-02-08 MED ORDER — THROMBIN 5000 UNITS EX SOLR
CUTANEOUS | Status: AC
  Filled 2014-02-08: qty 5000

## 2014-02-08 MED ORDER — LIDOCAINE HCL (CARDIAC) 20 MG/ML IV SOLN
INTRAVENOUS | Status: DC | PRN
  Administered 2014-02-08: 50 mg via INTRAVENOUS

## 2014-02-08 MED ORDER — MORPHINE SULFATE 2 MG/ML IJ SOLN: 1.0000 mg | INTRAMUSCULAR | Status: DC | PRN

## 2014-02-08 SURGICAL SUPPLY — 48 items
ADH SKN CLS APL DERMABOND .7 (GAUZE/BANDAGES/DRESSINGS) ×1
BUR ROUND FLUTED 4 SOFT TCH (BURR) ×1 IMPLANT
BUR ROUND FLUTED 4MM SOFT TCH (BURR) ×1
CANISTER SUCTION WELLS/JOHNSON (MISCELLANEOUS) ×2 IMPLANT
CORDS BIPOLAR (ELECTRODE) ×3 IMPLANT
COVER SURGICAL LIGHT HANDLE (MISCELLANEOUS) ×3 IMPLANT
DERMABOND ADVANCED (GAUZE/BANDAGES/DRESSINGS) ×2
DERMABOND ADVANCED .7 DNX12 (GAUZE/BANDAGES/DRESSINGS) ×1 IMPLANT
DRAPE MICROSCOPE LEICA (MISCELLANEOUS) ×3 IMPLANT
DRAPE PROXIMA HALF (DRAPES) ×6 IMPLANT
DRSG EMULSION OIL 3X3 NADH (GAUZE/BANDAGES/DRESSINGS) ×1 IMPLANT
DRSG MEPILEX BORDER 4X4 (GAUZE/BANDAGES/DRESSINGS) ×1 IMPLANT
DRSG MEPILEX BORDER 4X8 (GAUZE/BANDAGES/DRESSINGS) ×3 IMPLANT
DURAPREP 26ML APPLICATOR (WOUND CARE) ×3 IMPLANT
ELECT REM PT RETURN 9FT ADLT (ELECTROSURGICAL) ×3
ELECTRODE REM PT RTRN 9FT ADLT (ELECTROSURGICAL) ×1 IMPLANT
GLOVE BIOGEL PI IND STRL 7.5 (GLOVE) ×1 IMPLANT
GLOVE BIOGEL PI IND STRL 8 (GLOVE) ×1 IMPLANT
GLOVE BIOGEL PI INDICATOR 7.5 (GLOVE) ×2
GLOVE BIOGEL PI INDICATOR 8 (GLOVE) ×2
GLOVE ECLIPSE 7.0 STRL STRAW (GLOVE) ×3 IMPLANT
GLOVE ORTHO TXT STRL SZ7.5 (GLOVE) ×3 IMPLANT
GOWN STRL REUS W/ TWL LRG LVL3 (GOWN DISPOSABLE) ×3 IMPLANT
GOWN STRL REUS W/TWL LRG LVL3 (GOWN DISPOSABLE) ×9
KIT BASIN OR (CUSTOM PROCEDURE TRAY) ×3 IMPLANT
KIT ROOM TURNOVER OR (KITS) ×3 IMPLANT
MANIFOLD NEPTUNE II (INSTRUMENTS) ×1 IMPLANT
NDL SPNL 18GX3.5 QUINCKE PK (NEEDLE) ×1 IMPLANT
NDL SUT .5 MAYO 1.404X.05X (NEEDLE) ×1 IMPLANT
NEEDLE 22X1 1/2 (OR ONLY) (NEEDLE) ×3 IMPLANT
NEEDLE MAYO TAPER (NEEDLE)
NEEDLE SPNL 18GX3.5 QUINCKE PK (NEEDLE) ×6 IMPLANT
NS IRRIG 1000ML POUR BTL (IV SOLUTION) ×3 IMPLANT
PACK LAMINECTOMY ORTHO (CUSTOM PROCEDURE TRAY) ×3 IMPLANT
PAD ARMBOARD 7.5X6 YLW CONV (MISCELLANEOUS) ×6 IMPLANT
PATTIES SURGICAL .5 X.5 (GAUZE/BANDAGES/DRESSINGS) ×1 IMPLANT
PATTIES SURGICAL .75X.75 (GAUZE/BANDAGES/DRESSINGS) IMPLANT
SUT VIC AB 0 CT1 27 (SUTURE) ×6
SUT VIC AB 0 CT1 27XBRD ANBCTR (SUTURE) IMPLANT
SUT VIC AB 2-0 CT1 27 (SUTURE) ×3
SUT VIC AB 2-0 CT1 TAPERPNT 27 (SUTURE) IMPLANT
SUT VICRYL 0 TIES 12 18 (SUTURE) ×3 IMPLANT
SUT VICRYL 4-0 PS2 18IN ABS (SUTURE) ×2 IMPLANT
SUT VICRYL AB 2 0 TIES (SUTURE) ×3 IMPLANT
SYR 20ML ECCENTRIC (SYRINGE) IMPLANT
SYR CONTROL 10ML LL (SYRINGE) ×3 IMPLANT
TOWEL OR 17X24 6PK STRL BLUE (TOWEL DISPOSABLE) ×3 IMPLANT
TOWEL OR 17X26 10 PK STRL BLUE (TOWEL DISPOSABLE) ×3 IMPLANT

## 2014-02-08 NOTE — Op Note (Signed)
[name], Larry Huffman NO.:  [id number]  MEDICAL RECORD NO.:  [id number]  LOCATION:                               FACILITY:  [facility]  PHYSICIAN:  Deby Adger C. Lorin Mercy, M.D.    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  PROCEDURE:  L3-4 and L4-5 decompression centrally with bilateral foraminal decompression and left L4-5 microdiskectomy.  SURGEON:  Caidence Kaseman C. Lorin Mercy, MD  ASSISTANT:  Phillips Hay, PA-C, medically necessary and present for the entire procedure.  ESTIMATED BLOOD LOSS:  100 mL.  DRAINS:  None.  COMPLICATIONS:  None.  DESCRIPTION OF PROCEDURE:  After induction of general anesthesia, orotracheal intubation, the patient was placed prone on chest rolls. Right arm was tucked at his side since he had previous shoulder problems and he could not get his arms at 90:90 position in the prone positioning with anterior shoulder pads.  Ulnar nerve on the right was padded.  The left arm was good at 90:90 with anterior roll pads under the shoulder and pad underneath the ulnar nerve.  Back was prepped with DuraPrep. The area squared with towels.  Betadine, Steri-Drape applied. Laminectomy sheet and drapes were applied.  Time-out procedure completed.  Spinal needle was placed at the expected level on top of bone for decompression, x-ray taken.  Spinal needles were adjusted. Repeat x-ray obtained and then incision was made.  Subperiosteal dissection on to the lamina out to the facets at the planned level and then 2 Kocher clamps were placed and a second x-ray taken for planned level of decompression above and below.  Posterior elements were removed.  The lamina was thinned with  a bur.  There was 1-2 cm thick chunks of ligamentum which was causing severe compression.  Overhanging spurs from facet approached halfway to the midline and had to be removed, trimmed with a Kerrison rongeur, dura was protected.  There was some adherence to the dura and microdissection went with the microscope to remove areas where the patient had multiple epidural injections in the past, freeing up the dura from thick chunks of ligament and facet spurs and then decompression was performed.  There was a large disk bulge on the left at 4-5.  Anulus was incised, poked actually with a Penfield 4, and micropituitary was used to remove some chunks of disk which helped bring the disk down flat.  Dura was finally round.  Chunks of ligament were removed just inferior to the level of the L4-5 disk space, and palpation along the lateral gutters removing remaining pieces of spur.  Some thrombin-soaked Gelfoam had been used in the gutters. Some epidural veins in the gutters had been coagulated as needed. Operative field was dry.  Dural tube was round.  Dura was intact.  Irrigation with saline solution and standard closure with #1 Vicryl in deep fascia, 2-0 on the subcutaneous tissue, subcuticular Vicryl closure, Dermabond postop dressing, and transferred to  the recovery room.  Instrument count and needle count was correct.     Navy Belay C. Lorin Mercy, M.D.     MCY/MEDQ  D:  02/08/2014  T:  02/08/2014  Job:  [id number]

## 2014-02-08 NOTE — Plan of Care (Signed)
Problem: Consults Goal: Diagnosis - Spinal Surgery Microdiscectomy

## 2014-02-08 NOTE — Transfer of Care (Signed)
Immediate Anesthesia Transfer of Care Note  Patient: Larry Huffman  Procedure(s) Performed: Procedure(s) with comments: L3-4, L4-5 Decompression (N/A) - L3-4, L4-5 Decompression  Patient Location: PACU  Anesthesia Type:General  Level of Consciousness: awake and alert   Airway & Oxygen Therapy: Patient Spontanous Breathing  Post-op Assessment: Report given to PACU RN and Post -op Vital signs reviewed and stable  Post vital signs: Reviewed and stable  Complications: No apparent anesthesia complications

## 2014-02-08 NOTE — Anesthesia Postprocedure Evaluation (Signed)
  Anesthesia Post-op Note  Patient: Larry Huffman  Procedure(s) Performed: Procedure(s) with comments: L3-4, L4-5 Decompression (N/A) - L3-4, L4-5 Decompression  Patient Location: PACU  Anesthesia Type:General  Level of Consciousness: awake, alert  and oriented  Airway and Oxygen Therapy: Patient Spontanous Breathing and Patient connected to nasal cannula oxygen  Post-op Pain: mild  Post-op Assessment: Post-op Vital signs reviewed, Patient's Cardiovascular Status Stable, Respiratory Function Stable, Patent Airway and Pain level controlled  Post-op Vital Signs: stable  Last Vitals:  Filed Vitals:   02/08/14 1524  BP: 146/51  Pulse: 56  Temp: 36.6 C  Resp: 16    Complications: No apparent anesthesia complications

## 2014-02-08 NOTE — Interval H&P Note (Signed)
History and Physical Interval Note:  02/08/2014 7:26 AM  Larry Huffman  has presented today for surgery, with the diagnosis of L3-4, L4-5 Stenosis  The various methods of treatment have been discussed with the patient and family. After consideration of risks, benefits and other options for treatment, the patient has consented to  Procedure(s) with comments: L3-4, L4-5 Decompression (N/A) - L3-4, L4-5 Decompression as a surgical intervention .  The patient's history has been reviewed, patient examined, no change in status, stable for surgery.  I have reviewed the patient's chart and labs.  Questions were answered to the patient's satisfaction.     [name]

## 2014-02-08 NOTE — Progress Notes (Signed)
Pt. States he was treated for an ear infection and took ciprofloxacn 500mg  bid for 7 days. Last dose was Wednesday,July 29. Daughter stated he got blisters on his lips but not sure if it was related to the antibiotic or they were at the beach last week and he got a lot of sun one day. Daughter states no other blisters were seen on his body.

## 2014-02-08 NOTE — Progress Notes (Signed)
Occupational Therapy Evaluation Patient Details Name: Larry Huffman MRN: [id number] DOB: 1924/01/12 Today's Date: 02/08/2014    History of Present Illness Larry Huffman is an 80 y.o. Male s/p microdiscectomy and lumbar laminectomy on 02/08/14. PMH of cataracts, a history of DVT, heart disease, and macular degeneration, cardiac stents x 2.   Clinical Impression   PTA pt lived at home with his daughter and was independent with ADLs and functional mobility, although he admits that it was difficult to get around by the end of the day due to pain. Pt is moving extremely well, however requires VC's to maintain back precautions throughout OT session. Pt would benefit from skilled OT for education on back precautions and increased independence with ADLs.     Follow Up Recommendations  No OT follow up;Supervision/Assistance - 24 hour    Equipment Recommendations  None recommended by OT       Precautions / Restrictions Precautions Precautions: Back;Fall Precaution Comments: Educated pt and son on 3/3 back precautions and incorporating into ADLs.  Restrictions Weight Bearing Restrictions: No      Mobility Bed Mobility Overal bed mobility: Needs Assistance Bed Mobility: Rolling;Sidelying to Sit Rolling: Supervision Sidelying to sit: Supervision       General bed mobility comments: Supervision for safety and VC's for back precautions.  Transfers Overall transfer level: Needs assistance Equipment used: 1 person hand held assist;Rolling walker (2 wheeled) Transfers: Sit to/from Stand Sit to Stand: Min assist         General transfer comment: Min (A) to stabilize RW or 1 person hand held. VC's for sequencing and "nose over toes." VC's to maintain back precautions.         ADL Overall ADL's : Needs assistance/impaired Eating/Feeding: Independent;Sitting   Grooming: Min guard;Standing   Upper Body Bathing: Set up;Sitting   Lower Body Bathing: Sit to/from stand;Cueing for back  precautions;Minimal assistance   Upper Body Dressing : Set up;Sitting   Lower Body Dressing: Sit to/from stand;Cueing for back precautions;Minimal assistance   Toilet Transfer: Ambulation;Comfort height toilet;RW;Minimal assistance   Toileting- Clothing Manipulation and Hygiene: Minimal assistance;Cueing for back precautions   Tub/ Shower Transfer: Walk-in shower;Ambulation;Minimal assistance (1 person hand held assist)   Functional mobility during ADLs: Minimal assistance;Rolling walker (1 person hand held) General ADL Comments: Pt did extremely well with therapy this afternoon. Pt was awake and alert and did not c/o pain. Pt performed transfer and ambulation with RW and with 1 person hand held attempt. Pt had difficulty managing RW and feel it may get in his way. Pt ambulates better with 1 person hand held attempt and may look into trial of [employer] for increased balance. Pt was able to ambulate around room, transfer to toilet, and sit in recliner for dinner.      Vision  Pt has hx of cataracts and macular degeneration.  Pt reports no change from baseline and has no new apparent visual deficits.                    Perception Perception Perception Tested?: No   Praxis Praxis Praxis tested?: Within functional limits    Pertinent Vitals/Pain No c/o pain; NAD     Hand Dominance Right   Extremity/Trunk Assessment Upper Extremity Assessment Upper Extremity Assessment: Overall WFL for tasks assessed   Lower Extremity Assessment Lower Extremity Assessment: Overall WFL for tasks assessed   Cervical / Trunk Assessment Cervical / Trunk Assessment: Normal   Communication Communication Communication: HOH (has hearing aids)  Cognition Arousal/Alertness: Awake/alert Behavior During Therapy: WFL for tasks assessed/performed Overall Cognitive Status: Within Functional Limits for tasks assessed       Memory: Decreased recall of precautions                         Home Living Family/patient expects to be discharged to:: Private residence Living Arrangements: Children (adult daughter) Available Help at Discharge: Family;Available 24 hours/day (son is able to stay with pt for the first few days) Type of Home: House Home Access: Ramped entrance     Home Layout: Two level;Able to live on main level with bedroom/bathroom (1 small step into bathroom)     Bathroom Shower/Tub: Occupational psychologist: Standard     Home Equipment: Shower seat          Prior Functioning/Environment Level of Independence: Independent        Comments: Had difficulty by the evening due to pain, however was not using DME for ambulation     OT Diagnosis: Generalized weakness;Acute pain   OT Problem List: Decreased strength;Decreased range of motion;Decreased activity tolerance;Impaired balance (sitting and/or standing);Decreased safety awareness;Decreased knowledge of use of DME or AE;Decreased knowledge of precautions   OT Treatment/Interventions: Self-care/ADL training;Therapeutic exercise;Energy conservation;DME and/or AE instruction;Therapeutic activities;Patient/family education;Balance training    OT Goals(Current goals can be found in the care plan section) Acute Rehab OT Goals Patient Stated Goal: to get back to doing what I enjoy OT Goal Formulation: With patient/family Time For Goal Achievement: 02/15/14 Potential to Achieve Goals: Good ADL Goals Pt Will Perform Grooming: with modified independence;standing Pt Will Perform Lower Body Dressing: with modified independence;sit to/from stand Pt Will Transfer to Toilet: with modified independence;ambulating;regular height toilet Pt Will Perform Toileting - Clothing Manipulation and hygiene: with modified independence;sit to/from stand Pt Will Perform Tub/Shower Transfer: Shower transfer;with modified independence;ambulating;shower seat Additional ADL Goal #1: Pt will verbalize 3/3 back  precautions correctly with Supervision for safety with ADLs.  Additional ADL Goal #2: Pt will perform bed mobility with Supervision using log roll technique to prepare for ADLs.  OT Frequency: Min 2X/week              End of Session Equipment Utilized During Treatment: Gait belt;Rolling walker Nurse Communication: Mobility status;Other (comment) (pt IV transfusion complete)  Activity Tolerance: Patient tolerated treatment well Patient left: in chair;with call bell/phone within reach;with family/visitor present   Time: 5009-3818 OT Time Calculation (min): 38 min Charges:  OT General Charges $OT Visit: 1 Procedure OT Evaluation $Initial OT Evaluation Tier I: 1 Procedure OT Treatments $Self Care/Home Management : 23-37 mins G-Codes: OT G-codes **NOT FOR INPATIENT CLASS** Functional Assessment Tool Used: clinical judgement Functional Limitation: Self care Self Care Current Status (E9937): At least 1 percent but less than 20 percent impaired, limited or restricted Self Care Goal Status (J6967): 0 percent impaired, limited or restricted   Juluis Rainier [phone] 02/08/2014, 5:42 PM

## 2014-02-08 NOTE — Discharge Instructions (Signed)
No lifting greater than 10 lbs. Avoid bending, stooping and twisting. Walk in house for first week them may start to get out slowly increasing distance up to one mile by 3 weeks post op. Keep incision dry for 3 days, may use tegaderm or similar water impervious dressing. Ice packs to back as needed for pain and swelling.

## 2014-02-08 NOTE — Anesthesia Preprocedure Evaluation (Addendum)
Anesthesia Evaluation  Patient identified by MRN, date of birth, ID band Patient awake    Reviewed: Allergy & Precautions, H&P , NPO status , Patient's Chart, lab work & pertinent test results, reviewed documented beta blocker date and time   Airway Mallampati: II TM Distance: >3 FB Neck ROM: Full    Dental  (+) Edentulous Upper, Dental Advisory Given   Pulmonary sleep apnea , former smoker,  breath sounds clear to auscultation        Cardiovascular hypertension, + CAD and + Peripheral Vascular Disease Rhythm:Regular Rate:Normal     Neuro/Psych    GI/Hepatic GERD-  ,  Endo/Other    Renal/GU      Musculoskeletal   Abdominal   Peds  Hematology   Anesthesia Other Findings   Reproductive/Obstetrics                          Anesthesia Physical Anesthesia Plan  ASA: III  Anesthesia Plan: General   Post-op Pain Management:    Induction: Intravenous  Airway Management Planned: Oral ETT  Additional Equipment:   Intra-op Plan:   Post-operative Plan: Extubation in OR  Informed Consent: I have reviewed the patients History and Physical, chart, labs and discussed the procedure including the risks, benefits and alternatives for the proposed anesthesia with the patient or authorized representative who has indicated his/her understanding and acceptance.   Dental advisory given  Plan Discussed with: CRNA, Anesthesiologist and Surgeon  Anesthesia Plan Comments: (Lumbar spinal stenosis CAD S/P ptca with stents clinically stable Htn Sleep apnea on CPAP H/O prostate Ca   Plan GA with oral ETT  Roberts Gaudy, MD)       Anesthesia Quick Evaluation

## 2014-02-08 NOTE — Brief Op Note (Cosign Needed)
02/08/2014  9:34 AM  PATIENT:  Larry Huffman  80 y.o. male  PRE-OPERATIVE DIAGNOSIS:  L3-4, L4-5 Stenosis  POST-OPERATIVE DIAGNOSIS:  L3-4, L4-5 Stenosis  PROCEDURE:  Procedure(s) with comments: L3-4, L4-5 Decompression (N/A) - L3-4, L4-5 Decompression  SURGEON:  Surgeon(s) and Role:    * Marybelle Killings, MD - Primary  PHYSICIAN ASSISTANT: Phillips Hay PAC  ASSISTANTS: none   ANESTHESIA:   general  EBL:  Total I/O In: 1000 [I.V.:1000] Out: 50 [Blood:50]  BLOOD ADMINISTERED:none  DRAINS: none   LOCAL MEDICATIONS USED:  MARCAINE     SPECIMEN:  No Specimen  DISPOSITION OF SPECIMEN:  N/A  COUNTS:  YES  TOURNIQUET:  * No tourniquets in log *  DICTATION: .Note written in EPIC  PLAN OF CARE: Admit for overnight observation  PATIENT DISPOSITION:  PACU - hemodynamically stable.   Delay start of Pharmacological VTE agent (>24hrs) due to surgical blood loss or risk of bleeding: yes

## 2014-02-09 DIAGNOSIS — M48062 Spinal stenosis, lumbar region with neurogenic claudication: Secondary | ICD-10-CM | POA: Diagnosis not present

## 2014-02-09 MED ORDER — CHLORHEXIDINE GLUCONATE 0.12 % MT SOLN
15.0000 mL | Freq: Two times a day (BID) | OROMUCOSAL | Status: DC
  Administered 2014-02-09: 15 mL via OROMUCOSAL
  Filled 2014-02-09 (×3): qty 15

## 2014-02-09 NOTE — Progress Notes (Signed)
UR Completed.  Larry Huffman [id number] 02/09/2014

## 2014-02-09 NOTE — Progress Notes (Signed)
PT Cancellation Note/DC Note  Patient Details Name: Larry Huffman MRN: [id number] DOB: 08/04/1923   Cancelled Treatment:    Reason Eval/Treat Not Completed: Patient declined, stating last time he had a PT see him in the hospital he was charged $200 to walk a little.  Pt was able to get up with his son and walk in hallways without difficulty. Pt did not have any questions for PT.  He was advised that walking is good exercise after back surgery and was issued a handout with back precautions/safety on it.  Pt plans to DC today with help from family.     Melvern Banker 02/09/2014, 10:00 AM Lavonia Dana, PT  [phone] 02/09/2014

## 2014-02-09 NOTE — Progress Notes (Signed)
Discharge instructions gone over with patient and patient's son. Prescription given. Home medications gone over. Follow up appointment to be made. Diet, activity, and back precautions discussed. Signs and symptoms of infections and reasons to call the doctor discussed. My chart discussed. Use of nitroglycerin discussed. Patient and son verbalized understanding of instructions.

## 2014-02-09 NOTE — Progress Notes (Signed)
Subjective: 1 Day Post-Op Procedure(s) (LRB): L3-4, L4-5 Decompression (N/A) Patient reports pain as mild.  My pain is gone.   Objective: Vital signs in last 24 hours: Temp:  [97.7 F (36.5 C)-99.3 F (37.4 C)] 98.7 F (37.1 C) (08/01 0539) Pulse Rate:  [47-57] 56 (08/01 0539) Resp:  [9-17] 16 (08/01 0539) BP: (131-155)/(37-54) 132/54 mmHg (08/01 0539) SpO2:  [97 %-100 %] 98 % (08/01 0539)  Intake/Output from previous day: 07/31 0701 - 08/01 0700 In: 2878.8 [P.O.:960; I.V.:1918.8] Out: 800 [Urine:750; Blood:50] Intake/Output this shift:    No results found for this basename: HGB,  in the last 72 hours No results found for this basename: WBC, RBC, HCT, PLT,  in the last 72 hours No results found for this basename: NA, K, CL, CO2, BUN, CREATININE, GLUCOSE, CALCIUM,  in the last 72 hours No results found for this basename: LABPT, INR,  in the last 72 hours  Neurologically intact  Assessment/Plan: 1 Day Post-Op Procedure(s) (LRB): L3-4, L4-5 Decompression (N/A) Plan:    Discharge home.   Larry Huffman C 02/09/2014, 9:28 AM

## 2014-02-09 NOTE — Discharge Summary (Signed)
Physician Discharge Summary  Patient ID: Larry Huffman MRN: [id number] DOB/AGE: 03-18-1924 80 y.o.  Admit date: 02/08/2014 Discharge date: 02/09/2014  Admission Diagnoses: Spinal stenosis  Discharge Diagnoses:  Principal Problem:   Spinal stenosis, lumbar region, with neurogenic claudication   Discharged Condition: stable  Hospital Course: Patient's hospital course was essentially unremarkable. Patient underwent spinal decompression postoperatively progressed well and was discharged to home in stable condition.  Consults: None  Significant Diagnostic Studies: labs: Routine labs  Treatments: surgery: See operative note  Discharge Exam: Blood pressure 132/54, pulse 56, temperature 98.7 F (37.1 C), temperature source Oral, resp. rate 16, height 5\' 7"  (1.702 m), weight 83.462 kg (184 lb), SpO2 98.00%. Incision/Wound: dressing clean dry and intact  Disposition: 01-Home or Self Care     Medication List         amLODipine 10 MG tablet  Commonly known as:  NORVASC  Take 10 mg by mouth daily.     antipyrine-benzocaine otic solution  Commonly known as:  AURALGAN  Place 3-4 drops into the left ear every 2 (two) hours as needed for ear pain.     aspirin EC 81 MG tablet  Take 81 mg by mouth daily.     CALCIUM + D PO  Take 1 tablet by mouth daily.     clopidogrel 75 MG tablet  Commonly known as:  PLAVIX  Take 1 tablet (75 mg total) by mouth daily.     diphenhydramine-acetaminophen 25-500 MG Tabs  Commonly known as:  TYLENOL PM  Take 1 tablet by mouth at bedtime as needed. For pain     ferrous sulfate 325 (65 FE) MG tablet  Take 325 mg by mouth daily with breakfast.     Fish Oil 1200 MG Caps  Take 1 capsule by mouth daily.     fluticasone 50 MCG/ACT nasal spray  Commonly known as:  FLONASE  Place 1 spray into both nostrils daily.     multivitamin with minerals Tabs tablet  Take 1 tablet by mouth every morning.     nitroGLYCERIN 0.4 MG SL tablet  Commonly known  as:  NITROSTAT  Place 1 tablet (0.4 mg total) under the tongue every 5 (five) minutes as needed for chest pain.     oxyCODONE-acetaminophen 5-325 MG per tablet  Commonly known as:  ROXICET  Take 1-2 tablets by mouth every 4 (four) hours as needed.     pantoprazole 40 MG tablet  Commonly known as:  PROTONIX  Take 1 tablet (40 mg total) by mouth daily.     polyethylene glycol packet  Commonly known as:  MIRALAX / GLYCOLAX  Take 17 g by mouth at bedtime.     silver sulfADIAZINE 1 % cream  Commonly known as:  SILVADENE  Apply 1 application topically daily as needed. Apply daily as needed to treat wound infection     simvastatin 10 MG tablet  Commonly known as:  ZOCOR  Take 1 tablet (10 mg total) by mouth at bedtime.     tolterodine 4 MG 24 hr capsule  Commonly known as:  DETROL LA  Take 4 mg by mouth daily.     VISION FORMULA PO  Take 1 tablet by mouth daily. Lutein, Zeaxanthin, Omega-3     VITAMIN B6 PO  Take 1 tablet by mouth daily.     vitamin C 1000 MG tablet  Take 1,000 mg by mouth daily.           Follow-up Information   Follow  up with Marybelle Killings, MD. Schedule an appointment as soon as possible for a visit in 2 weeks.   Specialty:  Orthopedic Surgery   Contact information:   [place] [address] [phone]       Signed: Newt Minion 02/09/2014, 8:07 AM

## 2014-02-11 ENCOUNTER — Encounter (HOSPITAL_COMMUNITY): Payer: Self-pay | Admitting: Orthopaedic Surgery

## 2014-02-12 ENCOUNTER — Other Ambulatory Visit: Payer: Self-pay

## 2014-02-12 MED ORDER — PANTOPRAZOLE SODIUM 40 MG PO TBEC: 40.0000 mg | DELAYED_RELEASE_TABLET | Freq: Every day | ORAL | Status: DC

## 2014-02-12 MED ORDER — SIMVASTATIN 10 MG PO TABS: 10.0000 mg | ORAL_TABLET | Freq: Every day | ORAL | Status: DC

## 2014-02-12 MED ORDER — CLOPIDOGREL BISULFATE 75 MG PO TABS: 75.0000 mg | ORAL_TABLET | Freq: Every day | ORAL | Status: DC

## 2014-04-13 ENCOUNTER — Encounter (HOSPITAL_COMMUNITY): Payer: Self-pay | Admitting: Emergency Medicine

## 2014-04-13 ENCOUNTER — Inpatient Hospital Stay (HOSPITAL_COMMUNITY)
Admission: EM | Admit: 2014-04-13 | Discharge: 2014-04-20 | DRG: 176 | Disposition: A | Discharge status: Home / Self Care | Payer: Medicare HMO | Attending: Internal Medicine | Admitting: Internal Medicine

## 2014-04-13 ENCOUNTER — Emergency Department (HOSPITAL_COMMUNITY): Payer: Medicare HMO

## 2014-04-13 DIAGNOSIS — Z7902 Long term (current) use of antithrombotics/antiplatelets: Secondary | ICD-10-CM

## 2014-04-13 DIAGNOSIS — I5032 Chronic diastolic (congestive) heart failure: Secondary | ICD-10-CM | POA: Diagnosis not present

## 2014-04-13 DIAGNOSIS — H9190 Unspecified hearing loss, unspecified ear: Secondary | ICD-10-CM | POA: Diagnosis not present

## 2014-04-13 DIAGNOSIS — I10 Essential (primary) hypertension: Secondary | ICD-10-CM | POA: Diagnosis not present

## 2014-04-13 DIAGNOSIS — I129 Hypertensive chronic kidney disease with stage 1 through stage 4 chronic kidney disease, or unspecified chronic kidney disease: Secondary | ICD-10-CM | POA: Diagnosis not present

## 2014-04-13 DIAGNOSIS — I872 Venous insufficiency (chronic) (peripheral): Secondary | ICD-10-CM

## 2014-04-13 DIAGNOSIS — E785 Hyperlipidemia, unspecified: Secondary | ICD-10-CM

## 2014-04-13 DIAGNOSIS — Z79899 Other long term (current) drug therapy: Secondary | ICD-10-CM

## 2014-04-13 DIAGNOSIS — H9193 Unspecified hearing loss, bilateral: Secondary | ICD-10-CM | POA: Diagnosis present

## 2014-04-13 DIAGNOSIS — I2699 Other pulmonary embolism without acute cor pulmonale: Secondary | ICD-10-CM | POA: Diagnosis present

## 2014-04-13 DIAGNOSIS — R079 Chest pain, unspecified: Secondary | ICD-10-CM | POA: Diagnosis not present

## 2014-04-13 DIAGNOSIS — M4806 Spinal stenosis, lumbar region: Secondary | ICD-10-CM | POA: Diagnosis present

## 2014-04-13 DIAGNOSIS — N183 Chronic kidney disease, stage 3 unspecified: Secondary | ICD-10-CM

## 2014-04-13 DIAGNOSIS — Z87891 Personal history of nicotine dependence: Secondary | ICD-10-CM

## 2014-04-13 DIAGNOSIS — F0390 Unspecified dementia without behavioral disturbance: Secondary | ICD-10-CM | POA: Diagnosis present

## 2014-04-13 DIAGNOSIS — M48062 Spinal stenosis, lumbar region with neurogenic claudication: Secondary | ICD-10-CM | POA: Diagnosis present

## 2014-04-13 DIAGNOSIS — G4733 Obstructive sleep apnea (adult) (pediatric): Secondary | ICD-10-CM | POA: Diagnosis present

## 2014-04-13 DIAGNOSIS — Z7982 Long term (current) use of aspirin: Secondary | ICD-10-CM | POA: Diagnosis not present

## 2014-04-13 DIAGNOSIS — I2510 Atherosclerotic heart disease of native coronary artery without angina pectoris: Secondary | ICD-10-CM | POA: Diagnosis not present

## 2014-04-13 DIAGNOSIS — Z86711 Personal history of pulmonary embolism: Secondary | ICD-10-CM | POA: Diagnosis not present

## 2014-04-13 DIAGNOSIS — R071 Chest pain on breathing: Secondary | ICD-10-CM

## 2014-04-13 DIAGNOSIS — Z9849 Cataract extraction status, unspecified eye: Secondary | ICD-10-CM

## 2014-04-13 DIAGNOSIS — I739 Peripheral vascular disease, unspecified: Secondary | ICD-10-CM | POA: Diagnosis not present

## 2014-04-13 LAB — URINALYSIS, ROUTINE W REFLEX MICROSCOPIC
Bilirubin Urine: NEGATIVE
Glucose, UA: NEGATIVE mg/dL
Hgb urine dipstick: NEGATIVE
KETONES UR: NEGATIVE mg/dL
Leukocytes, UA: NEGATIVE
Nitrite: NEGATIVE
Protein, ur: 30 mg/dL — AB
Specific Gravity, Urine: 1.014 (ref 1.005–1.030)
UROBILINOGEN UA: 0.2 mg/dL (ref 0.0–1.0)
pH: 5 (ref 5.0–8.0)

## 2014-04-13 LAB — CBC WITH DIFFERENTIAL/PLATELET
Basophils Absolute: 0.2 10*3/uL — ABNORMAL HIGH (ref 0.0–0.1)
Basophils Relative: 1 % (ref 0–1)
EOS ABS: 0.2 10*3/uL (ref 0.0–0.7)
Eosinophils Relative: 1 % (ref 0–5)
HCT: 32.2 % — ABNORMAL LOW (ref 39.0–52.0)
Hemoglobin: 10.8 g/dL — ABNORMAL LOW (ref 13.0–17.0)
LYMPHS PCT: 17 % (ref 12–46)
Lymphs Abs: 2.6 10*3/uL (ref 0.7–4.0)
MCH: 30.9 pg (ref 26.0–34.0)
MCHC: 33.5 g/dL (ref 30.0–36.0)
MCV: 92 fL (ref 78.0–100.0)
Monocytes Absolute: 2.4 10*3/uL — ABNORMAL HIGH (ref 0.1–1.0)
Monocytes Relative: 16 % — ABNORMAL HIGH (ref 3–12)
NEUTROS PCT: 65 % (ref 43–77)
Neutro Abs: 9.9 10*3/uL — ABNORMAL HIGH (ref 1.7–7.7)
Platelets: 253 10*3/uL (ref 150–400)
RBC: 3.5 MIL/uL — AB (ref 4.22–5.81)
RDW: 14.2 % (ref 11.5–15.5)
WBC: 15.3 10*3/uL — ABNORMAL HIGH (ref 4.0–10.5)

## 2014-04-13 LAB — BASIC METABOLIC PANEL
ANION GAP: 13 (ref 5–15)
BUN: 25 mg/dL — ABNORMAL HIGH (ref 6–23)
CHLORIDE: 102 meq/L (ref 96–112)
CO2: 22 mEq/L (ref 19–32)
CREATININE: 1.1 mg/dL (ref 0.50–1.35)
Calcium: 9.2 mg/dL (ref 8.4–10.5)
GFR, EST AFRICAN AMERICAN: 68 mL/min — AB (ref >90)
GFR, EST NON AFRICAN AMERICAN: 58 mL/min — AB (ref >90)
Glucose, Bld: 113 mg/dL — ABNORMAL HIGH (ref 70–99)
Potassium: 4.1 mEq/L (ref 3.7–5.3)
Sodium: 137 mEq/L (ref 137–147)

## 2014-04-13 LAB — URINE MICROSCOPIC-ADD ON

## 2014-04-13 LAB — I-STAT TROPONIN, ED: TROPONIN I, POC: 0.01 ng/mL (ref 0.00–0.08)

## 2014-04-13 LAB — TROPONIN I

## 2014-04-13 LAB — HEPARIN LEVEL (UNFRACTIONATED): Heparin Unfractionated: 0.82 IU/mL — ABNORMAL HIGH (ref 0.30–0.70)

## 2014-04-13 LAB — TSH: TSH: 2.09 u[IU]/mL (ref 0.350–4.500)

## 2014-04-13 LAB — D-DIMER, QUANTITATIVE: D-Dimer, Quant: 3.71 ug/mL-FEU — ABNORMAL HIGH (ref 0.00–0.48)

## 2014-04-13 MED ORDER — HEPARIN (PORCINE) IN NACL 100-0.45 UNIT/ML-% IJ SOLN
1200.0000 [IU]/h | INTRAMUSCULAR | Status: DC
  Administered 2014-04-13: 1350 [IU]/h via INTRAVENOUS
  Administered 2014-04-14: 1200 [IU]/h via INTRAVENOUS
  Filled 2014-04-13 (×3): qty 250

## 2014-04-13 MED ORDER — FLUTICASONE PROPIONATE 50 MCG/ACT NA SUSP
1.0000 | Freq: Every day | NASAL | Status: DC | PRN
  Filled 2014-04-13: qty 16

## 2014-04-13 MED ORDER — MORPHINE SULFATE 2 MG/ML IJ SOLN: 1.0000 mg | INTRAMUSCULAR | Status: DC | PRN

## 2014-04-13 MED ORDER — ACETAMINOPHEN 650 MG RE SUPP: 650.0000 mg | Freq: Four times a day (QID) | RECTAL | Status: DC | PRN

## 2014-04-13 MED ORDER — CLOPIDOGREL BISULFATE 75 MG PO TABS
75.0000 mg | ORAL_TABLET | Freq: Every day | ORAL | Status: DC
  Administered 2014-04-13 – 2014-04-20 (×8): 75 mg via ORAL
  Filled 2014-04-13 (×8): qty 1

## 2014-04-13 MED ORDER — SIMVASTATIN 10 MG PO TABS
10.0000 mg | ORAL_TABLET | Freq: Every day | ORAL | Status: DC
  Administered 2014-04-13 – 2014-04-19 (×7): 10 mg via ORAL
  Filled 2014-04-13 (×8): qty 1

## 2014-04-13 MED ORDER — HYDROCODONE-ACETAMINOPHEN 5-325 MG PO TABS
1.0000 | ORAL_TABLET | ORAL | Status: DC | PRN
  Administered 2014-04-13: 2 via ORAL
  Administered 2014-04-18: 1 via ORAL
  Filled 2014-04-13: qty 1
  Filled 2014-04-13: qty 2

## 2014-04-13 MED ORDER — ACETAMINOPHEN 325 MG PO TABS
650.0000 mg | ORAL_TABLET | Freq: Four times a day (QID) | ORAL | Status: DC | PRN
  Administered 2014-04-15 – 2014-04-20 (×2): 650 mg via ORAL
  Filled 2014-04-13 (×2): qty 2

## 2014-04-13 MED ORDER — PANTOPRAZOLE SODIUM 40 MG PO TBEC
40.0000 mg | DELAYED_RELEASE_TABLET | Freq: Every day | ORAL | Status: DC
  Administered 2014-04-13 – 2014-04-20 (×8): 40 mg via ORAL
  Filled 2014-04-13 (×5): qty 1

## 2014-04-13 MED ORDER — IOHEXOL 350 MG/ML SOLN
65.0000 mL | Freq: Once | INTRAVENOUS | Status: AC | PRN
  Administered 2014-04-13: 65 mL via INTRAVENOUS

## 2014-04-13 MED ORDER — HEPARIN BOLUS VIA INFUSION: 4000.0000 [IU] | Freq: Once | INTRAVENOUS | Status: DC

## 2014-04-13 MED ORDER — POLYETHYLENE GLYCOL 3350 17 G PO PACK
17.0000 g | PACK | Freq: Every day | ORAL | Status: DC
  Administered 2014-04-13 – 2014-04-18 (×5): 17 g via ORAL
  Filled 2014-04-13 (×8): qty 1

## 2014-04-13 MED ORDER — HEPARIN BOLUS VIA INFUSION
4500.0000 [IU] | Freq: Once | INTRAVENOUS | Status: AC
  Administered 2014-04-13: 4500 [IU] via INTRAVENOUS
  Filled 2014-04-13: qty 4500

## 2014-04-13 MED ORDER — HEPARIN (PORCINE) IN NACL 100-0.45 UNIT/ML-% IJ SOLN: 1000.0000 [IU]/h | INTRAMUSCULAR | Status: DC

## 2014-04-13 MED ORDER — SODIUM CHLORIDE 0.9 % IJ SOLN
3.0000 mL | Freq: Two times a day (BID) | INTRAMUSCULAR | Status: DC
  Administered 2014-04-14: 3 mL via INTRAVENOUS

## 2014-04-13 MED ORDER — AMLODIPINE BESYLATE 5 MG PO TABS: 10.0000 mg | ORAL_TABLET | Freq: Every day | ORAL | Status: DC

## 2014-04-13 MED ORDER — ACETAMINOPHEN 325 MG PO TABS
650.0000 mg | ORAL_TABLET | Freq: Once | ORAL | Status: AC
  Administered 2014-04-13: 650 mg via ORAL
  Filled 2014-04-13: qty 2

## 2014-04-13 NOTE — ED Notes (Signed)
Pt c/o pain in left lung with inspiration onset yesterday.

## 2014-04-13 NOTE — ED Provider Notes (Addendum)
CSN: [id number]     Arrival date & time 04/13/14  0905 History   First MD Initiated Contact with Patient 04/13/14 0919     Chief Complaint  Patient presents with  . Chest Pain     (Consider location/radiation/quality/duration/timing/severity/associated sxs/prior Treatment) Patient is a 80 y.o. male presenting with chest pain. The history is provided by the patient.  Chest Pain Pain location:  L chest Pain quality: sharp and shooting   Pain radiates to:  Does not radiate Pain radiates to the back: no   Pain severity:  Severe Onset quality:  Gradual Duration:  1 day Timing:  Constant Progression:  Worsening Chronicity:  New Context: breathing   Relieved by:  None tried Worsened by:  Deep breathing and movement Ineffective treatments:  None tried Associated symptoms: no abdominal pain, no back pain, no cough, no dizziness, no dysphagia, no fever, no lower extremity edema, no nausea, no palpitations, no shortness of breath, not vomiting and no weakness   Risk factors: coronary artery disease, hypertension and male sex   Risk factors: no diabetes mellitus, no immobilization, no prior DVT/PE, no smoking and no surgery     Past Medical History  Diagnosis Date  . Hypertension   . Acid reflux   . High cholesterol   . Coronary artery disease   . Arthritis   . Sleep apnea     CPAP  . Peripheral vascular disease     Remote left carotid endarterectomy  . Cancer hx of skin cancer  . HOH (hard of hearing)     wears hearing aids, but still can not hear  . Incontinence of urine   . Constipation    Past Surgical History  Procedure Laterality Date  . Rotator cuff repair    . Prostate surgery    . Laparotomy    . Coronary stent placement    . Nasal sinus surgery    . Anal fissure repair    . Eye surgery      cataracts  . Irrigation and debridement abscess    . I&d extremity  06/28/2012    Procedure: IRRIGATION AND DEBRIDEMENT EXTREMITY;  Surgeon: Newt Minion, MD;  Location:  [place];  Service: Orthopedics;  Laterality: Right;  Debridement Wound Right Leg, VAC, Antibiotic Beads, Apply A-Cell  . 2-d echocardiogram  03/07/2008    Ejection fraction greater than 55%. Mild concentric left ventricular hypertrophy.LV relaxation. mildly dilated left atrium. Mild mitral regurgitation.   . Cardiac catheterization      X 2 stents  . Colonoscopy w/ polypectomy    . Colon surgery      removed part of the small intestine  . Lumbar laminectomy N/A 02/08/2014    Procedure: L3-4, L4-5 Decompression;  Surgeon: Marybelle Killings, MD;  Location: [place];  Service: Orthopedics;  Laterality: N/A;  L3-4, L4-5 Decompression   Family History  Problem Relation Age of Onset  . Hypertension Mother    History  Substance Use Topics  . Smoking status: Former Smoker    Types: Cigarettes    Quit date: 07/12/1978  . Smokeless tobacco: Never Used  . Alcohol Use: No    Review of Systems  Constitutional: Negative for fever.  HENT: Negative for trouble swallowing.   Respiratory: Negative for cough and shortness of breath.   Cardiovascular: Positive for chest pain. Negative for palpitations.  Gastrointestinal: Negative for nausea, vomiting and abdominal pain.  Musculoskeletal: Negative for back pain.  Neurological: Negative for dizziness and weakness.  All  other systems reviewed and are negative.     Allergies  Ciprofloxacin and Dapsone  Home Medications   Prior to Admission medications   Medication Sig Start Date End Date Taking? Authorizing Provider  amLODipine (NORVASC) 10 MG tablet Take 10 mg by mouth daily.   Yes Historical Provider, MD  Ascorbic Acid (VITAMIN C) 1000 MG tablet Take 1,000 mg by mouth daily.   Yes Historical Provider, MD  aspirin EC 81 MG tablet Take 81 mg by mouth daily.   Yes Historical Provider, MD  Calcium Carbonate-Vitamin D (CALCIUM + D PO) Take 1 tablet by mouth daily.   Yes Historical Provider, MD  clopidogrel (PLAVIX) 75 MG tablet Take 1 tablet (75 mg total)  by mouth daily. 02/12/14  Yes Lorretta Harp, MD  diphenhydramine-acetaminophen (TYLENOL PM) 25-500 MG TABS Take 1 tablet by mouth at bedtime as needed. For pain   Yes Historical Provider, MD  ferrous sulfate 325 (65 FE) MG tablet Take 325 mg by mouth daily with breakfast.   Yes Historical Provider, MD  montelukast (SINGULAIR) 10 MG tablet Take 10 mg by mouth at bedtime.   Yes Historical Provider, MD  Multiple Vitamin (MULTIVITAMIN WITH MINERALS) TABS Take 1 tablet by mouth every morning.    Yes Historical Provider, MD  nitroGLYCERIN (NITROSTAT) 0.4 MG SL tablet Place 1 tablet (0.4 mg total) under the tongue every 5 (five) minutes as needed for chest pain. 08/08/13  Yes Lorretta Harp, MD  Omega-3 Fatty Acids (FISH OIL) 1200 MG CAPS Take 1,200 mg by mouth daily.    Yes Historical Provider, MD  pantoprazole (PROTONIX) 40 MG tablet Take 1 tablet (40 mg total) by mouth daily. 02/12/14  Yes Lorretta Harp, MD  polyethylene glycol [facility] / GLYCOLAX) packet Take 17 g by mouth at bedtime.    Yes Historical Provider, MD  Pyridoxine HCl (VITAMIN B6 PO) Take 100 mg by mouth daily.    Yes Historical Provider, MD  simvastatin (ZOCOR) 10 MG tablet Take 1 tablet (10 mg total) by mouth at bedtime. 02/12/14  Yes Lorretta Harp, MD  tolterodine (DETROL LA) 4 MG 24 hr capsule Take 4 mg by mouth daily. 03/12/13  Yes Historical Provider, MD  fluticasone (FLONASE) 50 MCG/ACT nasal spray Place 1 spray into both nostrils daily as needed for allergies.     Historical Provider, MD   BP 166/56  Pulse 63  Temp(Src) 99.1 F (37.3 C) (Oral)  Resp 13  Ht 5\' 9"  (1.753 m)  Wt 185 lb (83.915 kg)  BMI 27.31 kg/m2  SpO2 100% Physical Exam  Nursing note and vitals reviewed. Constitutional: He is oriented to person, place, and time. He appears well-developed and well-nourished. No distress.  HENT:  Head: Normocephalic and atraumatic.  Mouth/Throat: Oropharynx is clear and moist.  Eyes: Conjunctivae and EOM are normal.  Pupils are equal, round, and reactive to light.  Neck: Normal range of motion. Neck supple.  Cardiovascular: Normal rate, regular rhythm and intact distal pulses.   Murmur heard.  Systolic murmur is present with a grade of 2/6  Heard best at the LSB  Pulmonary/Chest: Effort normal and breath sounds normal. No respiratory distress. He has no wheezes. He has no rales. He exhibits tenderness. He exhibits no mass, no crepitus, no edema and no swelling.    Abdominal: Soft. He exhibits no distension. There is no tenderness. There is no rebound and no guarding.  Musculoskeletal: Normal range of motion. He exhibits no edema and no tenderness.  Neurological: He is alert and oriented to person, place, and time.  Skin: Skin is warm and dry. No rash noted. No erythema.  Psychiatric: He has a normal mood and affect. His behavior is normal.    ED Course  Procedures (including critical care time) Labs Review Labs Reviewed  CBC WITH DIFFERENTIAL - Abnormal; Notable for the following:    WBC 15.3 (*)    RBC 3.50 (*)    Hemoglobin 10.8 (*)    HCT 32.2 (*)    Monocytes Relative 16 (*)    Neutro Abs 9.9 (*)    Monocytes Absolute 2.4 (*)    Basophils Absolute 0.2 (*)    All other components within normal limits  BASIC METABOLIC PANEL - Abnormal; Notable for the following:    Glucose, Bld 113 (*)    BUN 25 (*)    GFR calc non Af Amer 58 (*)    GFR calc Af Amer 68 (*)    All other components within normal limits  D-DIMER, QUANTITATIVE - Abnormal; Notable for the following:    D-Dimer, Quant 3.71 (*)    All other components within normal limits  URINALYSIS, ROUTINE W REFLEX MICROSCOPIC - Abnormal; Notable for the following:    Protein, ur 30 (*)    All other components within normal limits  URINE MICROSCOPIC-ADD ON  I-STAT TROPOININ, ED    Imaging Review Dg Chest 2 View  04/13/2014   CLINICAL DATA:  Left-sided chest pain since yesterday semi: History of coronary artery disease with stent  placement, hypertension, and previous tobacco use.  EXAM: CHEST  2 VIEW  COMPARISON:  PA and lateral chest of February 04, 2014  FINDINGS: The right lung is well-expanded and clear. On the left there is mild volume loss with apparent elevation of the hemidiaphragm. There is haziness of the left lateral and posterior costophrenic angles. There is no alveolar infiltrate. The cardiac silhouette is mildly enlarged though stable. There is tortuosity of the descending thoracic aorta. The pulmonary vascularity is not engorged. There are degenerative changes of the shoulders.  IMPRESSION: There is mild volume loss on the left with apparent elevation of the hemidiaphragm and subsegmental atelectasis in the costophrenic gutters. There is no evidence of pneumonia, pulmonary edema, nor of a significant pleural effusion.   Electronically Signed   By: David  [place]   On: 04/13/2014 11:05   Ct Angio Chest Pe W/cm &/or Wo Cm  04/13/2014   CLINICAL DATA:  r/o pe, left chest pain, elevated D-dimer, elevated white blood cell count  EXAM: CT ANGIOGRAPHY CHEST WITH CONTRAST  TECHNIQUE: Multidetector CT imaging of the chest was performed using the standard protocol during bolus administration of intravenous contrast. Multiplanar CT image reconstructions and MIPs were obtained to evaluate the vascular anatomy.  CONTRAST:  34mL OMNIPAQUE IOHEXOL 350 MG/ML SOLN  COMPARISON:  05/29/2010  FINDINGS: Mild right ventricular dilatation, RV/LV ratio 1.2. Segmental partially occlusive pulmonary emboli in right upper and middle lobe branches as well as in the central right lower lobe branch of the pulmonary artery. On the left, there is a single segmental embolus in the anterior basal segment lower lobe branch, partially occlusive. Patent superior and inferior pulmonary veins bilaterally. Mild left atrial enlargement. Patchy coronary and aortic calcifications. Classic 3 vessel brachiocephalic arterial origin anatomy from of the aortic arch without  proximal stenosis. No aortic dissection, aneurysm, or stenosis.  Small pleural effusions left greater than right. Sub cm prevascular, AP window, and right paratracheal lymph nodes. No  hilar adenopathy. Dependent atelectasis posteriorly in both lower lobes left greater than right. No confluent airspace consolidation or overt interstitial edema. Old lower thoracic compression deformity. Spurring across multiple contiguous levels in the mid and lower thoracic spine. Subcentimeter partially calcified stone in the dependent aspect of the gallbladder. Remainder of visualized upper abdomen unremarkable.  Review of the MIP images confirms the above findings.  IMPRESSION: 1. Bilateral central and segmental partially occlusive pulmonary emboli with CT evidence of right heartstrain (RV/LV Ratio = 1.2) consistent with at least submassive (intermediate risk)PE. The presence of right heart strain has been associated with anincreased risk of morbidity and mortality. Consultation with [facility] is recommended. Critical Value/emergent results were called by telephone at the time of interpretation on 04/13/2014 at 1:07 pm to Dr. Blanchie Dessert , who verbally acknowledged these results. 2. Atherosclerosis, including aortic and coronary artery disease. Please note that although the presence of coronary artery calcium documents the presence of coronary artery disease, the severity of this disease and any potential stenosis cannot be assessed on this non-gated CT examination. Assessment for potential risk factor modification, dietary therapy or pharmacologic therapy may be warranted, if clinically indicated. 3. Cholelithiasis   Electronically Signed   By: Arne Cleveland M.D.   On: 04/13/2014 13:09     EKG Interpretation   Date/Time:  Saturday April 13 2014 09:14:09 EDT Ventricular Rate:  66 PR Interval:  206 QRS Duration: 92 QT Interval:  430 QTC Calculation: 450 R Axis:   -15 Text  Interpretation:  Normal sinus rhythm Left ventricular hypertrophy No  significant change since last tracing Confirmed by Maryan Rued  MD, Loree Fee  [phone]) on 04/13/2014 9:19:06 AM      MDM   Final diagnoses:  Pulmonary embolism    Patient presenting with complaint of sharp left chest pain worse with deep breaths that started yesterday. Pain has progressively gotten worse without significant shortness of breath. He denies any URI symptoms including rhinorrhea, cough or fever.  No recent surgeries, mobilizations, travel or hospitalization. No prior history of clots. Denies any unilateral leg swelling. No prior history of pneumonia or lung issues. He does have a significant cardiac history status post 3 stents but states his chest pain today feels nothing like his prior heart issues.  EKG unchanged from prior. Lower suspicion for cardiac etiology at this time given the nature of the pain in the patient's symptoms however will rule out with troponins as patient's symptoms have been going on now for greater than 12 hours. Concern for possible pneumothorax, pneumonia, PE.  Low suspicion for abdominal pathology as patient has no abdominal pain, nausea, vomiting, diarrhea.  Patient denies any history of trauma and no external signs of trauma.  CBC, BMP, troponin, d-dimer, chest x-ray pending. Patient given Tylenol for pain.  12:01 PM Leukocytosis of 15,000 but otherwise unremarkable labs except for elevated d-dimer. Chest x-ray without significant pathology. CT PE pending. Patient feels much better after Tylenol  1:07 PM Pt found to have bilateral PE's.  With mild RV strain.  Will start anticoagulation and admit.  Blanchie Dessert, MD 04/13/14 [place], MD 04/13/14 [place], MD 04/13/14 1409

## 2014-04-13 NOTE — H&P (Signed)
[facility] History and Physical  Larry Huffman:[id number] DOB: 1924/05/17 DOA: 04/13/2014  Referring physician:  PCP: Florina Ou, MD   Chief Complaint: Acute PE  HPI: Larry Huffman is a 80 y.o. male with past medical history of hypertension, PVD and chronic back pain with recent L4-5 decompression and microdiscectomy. Patient given to the hospital because of acute chest pain. Patient was in his usual state of health until yesterday when he started to have left-sided chest pain, no radiation, sharp and noticed the pain increases with taking deep breath. In ED he was found to have leukocytosis of 15.3, negative troponin but has positive d-dimer of 3.7. CT scan was done and showed multiple bilateral PEs categorized by the radiologist as submassive PE. CT scan also showed CT evidence of right heart strain. Patient admitted to the hospital for further management.   Review of Systems:  Constitutional: negative for anorexia, fevers and sweats Eyes: negative for irritation, redness and visual disturbance Ears, nose, mouth, throat, and face: negative for earaches, epistaxis, nasal congestion and sore throat Respiratory: negative for cough, dyspnea on exertion, sputum and wheezing Cardiovascular: Chest pain, pleuritic Gastrointestinal: negative for abdominal pain, constipation, diarrhea, melena, nausea and vomiting Genitourinary:negative for dysuria, frequency and hematuria Hematologic/lymphatic: negative for bleeding, easy bruising and lymphadenopathy Musculoskeletal:negative for arthralgias, muscle weakness and stiff joints Neurological: negative for coordination problems, gait problems, headaches and weakness Endocrine: negative for diabetic symptoms including polydipsia, polyuria and weight loss Allergic/Immunologic: negative for anaphylaxis, hay fever and urticaria  Past Medical History  Diagnosis Date  . Hypertension   . Acid reflux   . High cholesterol   . Coronary artery  disease   . Arthritis   . Sleep apnea     CPAP  . Peripheral vascular disease     Remote left carotid endarterectomy  . Cancer hx of skin cancer  . HOH (hard of hearing)     wears hearing aids, but still can not hear  . Incontinence of urine   . Constipation    Past Surgical History  Procedure Laterality Date  . Rotator cuff repair    . Prostate surgery    . Laparotomy    . Coronary stent placement    . Nasal sinus surgery    . Anal fissure repair    . Eye surgery      cataracts  . Irrigation and debridement abscess    . I&d extremity  06/28/2012    Procedure: IRRIGATION AND DEBRIDEMENT EXTREMITY;  Surgeon: Newt Minion, MD;  Location: [facility];  Service: Orthopedics;  Laterality: Right;  Debridement Wound Right Leg, VAC, Antibiotic Beads, Apply A-Cell  . 2-d echocardiogram  03/07/2008    Ejection fraction greater than 55%. Mild concentric left ventricular hypertrophy.LV relaxation. mildly dilated left atrium. Mild mitral regurgitation.   . Cardiac catheterization      X 2 stents  . Colonoscopy w/ polypectomy    . Colon surgery      removed part of the small intestine  . Lumbar laminectomy N/A 02/08/2014    Procedure: L3-4, L4-5 Decompression;  Surgeon: Marybelle Killings, MD;  Location: [facility];  Service: Orthopedics;  Laterality: N/A;  L3-4, L4-5 Decompression   Social History:   reports that he quit smoking about 35 years ago. His smoking use included Cigarettes. He smoked 0.00 packs per day. He has never used smokeless tobacco. He reports that he does not drink alcohol or use illicit drugs.  Allergies  Allergen Reactions  .  Ciprofloxacin Rash  . Dapsone Other (See Comments)    Lowers WBC count?? "made me very sick"    Family History  Problem Relation Age of Onset  . Hypertension Mother      Prior to Admission medications   Medication Sig Start Date End Date Taking? Authorizing Provider  amLODipine (NORVASC) 10 MG tablet Take 10 mg by mouth daily.   Yes Historical  Provider, MD  Ascorbic Acid (VITAMIN C) 1000 MG tablet Take 1,000 mg by mouth daily.   Yes Historical Provider, MD  aspirin EC 81 MG tablet Take 81 mg by mouth daily.   Yes Historical Provider, MD  Calcium Carbonate-Vitamin D (CALCIUM + D PO) Take 1 tablet by mouth daily.   Yes Historical Provider, MD  clopidogrel (PLAVIX) 75 MG tablet Take 1 tablet (75 mg total) by mouth daily. 02/12/14  Yes Lorretta Harp, MD  diphenhydramine-acetaminophen (TYLENOL PM) 25-500 MG TABS Take 1 tablet by mouth at bedtime as needed. For pain   Yes Historical Provider, MD  ferrous sulfate 325 (65 FE) MG tablet Take 325 mg by mouth daily with breakfast.   Yes Historical Provider, MD  montelukast (SINGULAIR) 10 MG tablet Take 10 mg by mouth at bedtime.   Yes Historical Provider, MD  Multiple Vitamin (MULTIVITAMIN WITH MINERALS) TABS Take 1 tablet by mouth every morning.    Yes Historical Provider, MD  nitroGLYCERIN (NITROSTAT) 0.4 MG SL tablet Place 1 tablet (0.4 mg total) under the tongue every 5 (five) minutes as needed for chest pain. 08/08/13  Yes Lorretta Harp, MD  Omega-3 Fatty Acids (FISH OIL) 1200 MG CAPS Take 1,200 mg by mouth daily.    Yes Historical Provider, MD  pantoprazole (PROTONIX) 40 MG tablet Take 1 tablet (40 mg total) by mouth daily. 02/12/14  Yes Lorretta Harp, MD  polyethylene glycol [facility] / GLYCOLAX) packet Take 17 g by mouth at bedtime.    Yes Historical Provider, MD  Pyridoxine HCl (VITAMIN B6 PO) Take 100 mg by mouth daily.    Yes Historical Provider, MD  simvastatin (ZOCOR) 10 MG tablet Take 1 tablet (10 mg total) by mouth at bedtime. 02/12/14  Yes Lorretta Harp, MD  tolterodine (DETROL LA) 4 MG 24 hr capsule Take 4 mg by mouth daily. 03/12/13  Yes Historical Provider, MD  fluticasone (FLONASE) 50 MCG/ACT nasal spray Place 1 spray into both nostrils daily as needed for allergies.     Historical Provider, MD   Physical Exam: Filed Vitals:   04/13/14 1330  BP: 138/48  Pulse: 63  Temp:     Resp: 19   Constitutional: Oriented to person, place, and time. Well-developed and well-nourished. Cooperative.  Head: Normocephalic and atraumatic.  Nose: Nose normal.  Mouth/Throat: Uvula is midline, oropharynx is clear and moist and mucous membranes are normal.  Eyes: Conjunctivae and EOM are normal. Pupils are equal, round, and reactive to light.  Neck: Trachea normal and normal range of motion. Neck supple.  Cardiovascular: Normal rate, regular rhythm, S1 normal, S2 normal, normal heart sounds and intact distal pulses.   Pulmonary/Chest: Effort normal and breath sounds normal.  Abdominal: Soft. Bowel sounds are normal. There is no hepatosplenomegaly. There is no tenderness.  Musculoskeletal: Normal range of motion.  Neurological: Alert and oriented to person, place, and time. Has normal strength. No cranial nerve deficit or sensory deficit.  Skin: Skin is warm, dry and intact.  Psychiatric: Has a normal mood and affect. Speech is normal and behavior is normal.  Labs on Admission:  Basic Metabolic Panel:  Recent Labs Lab 04/13/14 1023  NA 137  K 4.1  CL 102  CO2 22  GLUCOSE 113*  BUN 25*  CREATININE 1.10  CALCIUM 9.2   Liver Function Tests: No results found for this basename: AST, ALT, ALKPHOS, BILITOT, PROT, ALBUMIN,  in the last 168 hours No results found for this basename: LIPASE, AMYLASE,  in the last 168 hours No results found for this basename: AMMONIA,  in the last 168 hours CBC:  Recent Labs Lab 04/13/14 1023  WBC 15.3*  NEUTROABS 9.9*  HGB 10.8*  HCT 32.2*  MCV 92.0  PLT 253   Cardiac Enzymes: No results found for this basename: CKTOTAL, CKMB, CKMBINDEX, TROPONINI,  in the last 168 hours  BNP (last 3 results) No results found for this basename: PROBNP,  in the last 8760 hours CBG: No results found for this basename: GLUCAP,  in the last 168 hours  Radiological Exams on Admission: Dg Chest 2 View  04/13/2014   CLINICAL DATA:  Left-sided chest  pain since yesterday semi: History of coronary artery disease with stent placement, hypertension, and previous tobacco use.  EXAM: CHEST  2 VIEW  COMPARISON:  PA and lateral chest of February 04, 2014  FINDINGS: The right lung is well-expanded and clear. On the left there is mild volume loss with apparent elevation of the hemidiaphragm. There is haziness of the left lateral and posterior costophrenic angles. There is no alveolar infiltrate. The cardiac silhouette is mildly enlarged though stable. There is tortuosity of the descending thoracic aorta. The pulmonary vascularity is not engorged. There are degenerative changes of the shoulders.  IMPRESSION: There is mild volume loss on the left with apparent elevation of the hemidiaphragm and subsegmental atelectasis in the costophrenic gutters. There is no evidence of pneumonia, pulmonary edema, nor of a significant pleural effusion.   Electronically Signed   By: David  Martinique   On: 04/13/2014 11:05   Ct Angio Chest Pe W/cm &/or Wo Cm  04/13/2014   CLINICAL DATA:  r/o pe, left chest pain, elevated D-dimer, elevated white blood cell count  EXAM: CT ANGIOGRAPHY CHEST WITH CONTRAST  TECHNIQUE: Multidetector CT imaging of the chest was performed using the standard protocol during bolus administration of intravenous contrast. Multiplanar CT image reconstructions and MIPs were obtained to evaluate the vascular anatomy.  CONTRAST:  68mL OMNIPAQUE IOHEXOL 350 MG/ML SOLN  COMPARISON:  05/29/2010  FINDINGS: Mild right ventricular dilatation, RV/LV ratio 1.2. Segmental partially occlusive pulmonary emboli in right upper and middle lobe branches as well as in the central right lower lobe branch of the pulmonary artery. On the left, there is a single segmental embolus in the anterior basal segment lower lobe branch, partially occlusive. Patent superior and inferior pulmonary veins bilaterally. Mild left atrial enlargement. Patchy coronary and aortic calcifications. Classic 3 vessel  brachiocephalic arterial origin anatomy from of the aortic arch without proximal stenosis. No aortic dissection, aneurysm, or stenosis.  Small pleural effusions left greater than right. Sub cm prevascular, AP window, and right paratracheal lymph nodes. No hilar adenopathy. Dependent atelectasis posteriorly in both lower lobes left greater than right. No confluent airspace consolidation or overt interstitial edema. Old lower thoracic compression deformity. Spurring across multiple contiguous levels in the mid and lower thoracic spine. Subcentimeter partially calcified stone in the dependent aspect of the gallbladder. Remainder of visualized upper abdomen unremarkable.  Review of the MIP images confirms the above findings.  IMPRESSION: 1. Bilateral central  and segmental partially occlusive pulmonary emboli with CT evidence of right heartstrain (RV/LV Ratio = 1.2) consistent with at least submassive (intermediate risk)PE. The presence of right heart strain has been associated with anincreased risk of morbidity and mortality. Consultation with [facility] is recommended. Critical Value/emergent results were called by telephone at the time of interpretation on 04/13/2014 at 1:07 pm to Dr. Blanchie Dessert , who verbally acknowledged these results. 2. Atherosclerosis, including aortic and coronary artery disease. Please note that although the presence of coronary artery calcium documents the presence of coronary artery disease, the severity of this disease and any potential stenosis cannot be assessed on this non-gated CT examination. Assessment for potential risk factor modification, dietary therapy or pharmacologic therapy may be warranted, if clinically indicated. 3. Cholelithiasis   Electronically Signed   By: Arne Cleveland M.D.   On: 04/13/2014 13:09    EKG: Independently reviewed.   Assessment/Plan Principal Problem:   Pulmonary embolism Active Problems:   Chest pain   CAD - CFX  DES 7/07, LAD DES 10/08, cath 11/11- medical Rx   Peripheral arterial disease: History of left carotid endarterectomy   Essential hypertension   Spinal stenosis, lumbar region, with neurogenic claudication    Acute PE -CT scan showed multiple bilateral PEs, at least submassive PE. -Per radiology there is CT evidence of right heart strain. -This is likely secondary to the recent back surgery on 02/09/14. -Start the patient on IV heparin, check 2-D echo. -Hemodynamically stable, consider PCCM consultation if blood pressure drops.  Leukocytosis -Likely secondary to stress reaction from the acute PE. -CT did not show any evidence of pneumonia, urinalysis is negative. -Follow closely, check CBC in a.m.  Chest pain -Pleuritic chest pain secondary to PE, negative cardiac enzymes.  Hx of CAD -With history of PCI, patient is on aspirin and Plavix, continue Plavix and hold aspirin. -Negative cardiac enzymes, check 2-D echocardiogram.  Hypertension -Blood pressure is reasonably controlled, I will hold antihypertensives in the presence of acute PE. -If blood pressure drops patient will need PCCM consult for consideration of thrombolytics.  Spinal stenosis -Status post recent decompression and microdiscectomy, no acute issues.  Code Status: Full code Family Communication: Discussed with the patient Disposition Plan: Inpatient, telemetry  Time spent: 70 minutes  [facility] Pager [phone]   \

## 2014-04-13 NOTE — ED Notes (Signed)
Patient returned from X-ray 

## 2014-04-13 NOTE — Plan of Care (Signed)
Problem: Consults Goal: Diagnosis - Venous Thromboembolism (VTE) Choose a selection Outcome: Completed/Met Date Met:  04/13/14 PE (Pulmonary Embolism)

## 2014-04-13 NOTE — Progress Notes (Signed)
ANTICOAGULATION CONSULT NOTE - Initial Consult  Pharmacy Consult for heparin Indication: pulmonary embolus  Allergies  Allergen Reactions  . Ciprofloxacin Rash  . Dapsone Other (See Comments)    Lowers WBC count?? "made me very sick"    Patient Measurements: Height: 5\' 9"  (175.3 cm) Weight: 185 lb (83.915 kg) IBW/kg (Calculated) : 70.7 Heparin Dosing Weight: 83.9kg  Vital Signs: Temp: 98.1 F (36.7 C) (10/03 1255) Temp Source: Oral (10/03 1255) BP: 138/48 mmHg (10/03 1330) Pulse Rate: 63 (10/03 1330)  Labs:  Recent Labs  04/13/14 1023  HGB 10.8*  HCT 32.2*  PLT 253  CREATININE 1.10    Estimated Creatinine Clearance: 47.3 ml/min (by C-G formula based on Cr of 1.1).   Medical History: Past Medical History  Diagnosis Date  . Hypertension   . Acid reflux   . High cholesterol   . Coronary artery disease   . Arthritis   . Sleep apnea     CPAP  . Peripheral vascular disease     Remote left carotid endarterectomy  . Cancer hx of skin cancer  . HOH (hard of hearing)     wears hearing aids, but still can not hear  . Incontinence of urine   . Constipation     Medications:  Infusions:  . heparin    . heparin      Assessment: 70 yom presented to the ED with CP. D-dimer found to elevated and CT revealed bilateral PE's with right heart strain. Baseline H/H is slightly low and platelets are WNL. He is not on anticoagulation PTA.   Goal of Therapy:  Heparin level 0.3-0.7 units/ml Monitor platelets by anticoagulation protocol: Yes   Plan:  1. Heparin bolus 4500 units IV x 1 2. Heparin gtt 1350 units/hr 3. Check an 8 hour HL 4. Daily HL and CBC 5. F/u oral AC plan   Jaymarion Trombly, Rande Lawman 04/13/2014,2:17 PM

## 2014-04-13 NOTE — Progress Notes (Signed)
ANTICOAGULATION CONSULT NOTE - Follow Up Consult  Pharmacy Consult for Heparin  Indication: pulmonary embolus  Allergies  Allergen Reactions  . Ciprofloxacin Rash  . Dapsone Other (See Comments)    Lowers WBC count?? "made me very sick"    Patient Measurements: Height: 5\' 9"  (175.3 cm) Weight: 185 lb (83.915 kg) IBW/kg (Calculated) : 70.7  Vital Signs: Temp: 99.5 F (37.5 C) (10/03 2133) Temp Source: Oral (10/03 2133) BP: 157/45 mmHg (10/03 2002) Pulse Rate: 68 (10/03 2351)  Labs:  Recent Labs  04/13/14 1023 04/13/14 1414 04/13/14 1935 04/13/14 2300  HGB 10.8*  --   --   --   HCT 32.2*  --   --   --   PLT 253  --   --   --   HEPARINUNFRC  --   --   --  0.82*  CREATININE 1.10  --   --   --   TROPONINI  --  <0.30 <0.30  --     Estimated Creatinine Clearance: 47.3 ml/min (by C-G formula based on Cr of 1.1).   Assessment: Heparin for PE. First HL is slightly elevated at 0.82. Other labs as above. No issues per RN.   Goal of Therapy:  Heparin level 0.3-0.7 units/ml Monitor platelets by anticoagulation protocol: Yes   Plan:  -Decrease heparin to 1200 units/hr -0800 HL -Daily CBC/HL -Monitor for bleeding  Narda Bonds 04/13/2014,11:52 PM

## 2014-04-13 NOTE — ED Notes (Signed)
Patient transported to X-ray 

## 2014-04-14 DIAGNOSIS — I517 Cardiomegaly: Secondary | ICD-10-CM

## 2014-04-14 DIAGNOSIS — R609 Edema, unspecified: Secondary | ICD-10-CM

## 2014-04-14 DIAGNOSIS — I2510 Atherosclerotic heart disease of native coronary artery without angina pectoris: Secondary | ICD-10-CM

## 2014-04-14 DIAGNOSIS — I2699 Other pulmonary embolism without acute cor pulmonale: Secondary | ICD-10-CM

## 2014-04-14 LAB — HEPARIN LEVEL (UNFRACTIONATED)
Heparin Unfractionated: 1.02 IU/mL — ABNORMAL HIGH (ref 0.30–0.70)
Heparin Unfractionated: 0.31 IU/mL (ref 0.30–0.70)

## 2014-04-14 LAB — BASIC METABOLIC PANEL
Anion gap: 12 (ref 5–15)
BUN: 28 mg/dL — AB (ref 6–23)
CALCIUM: 8.5 mg/dL (ref 8.4–10.5)
CO2: 22 mEq/L (ref 19–32)
Chloride: 107 mEq/L (ref 96–112)
Creatinine, Ser: 1.17 mg/dL (ref 0.50–1.35)
GFR calc Af Amer: 63 mL/min — ABNORMAL LOW (ref >90)
GFR, EST NON AFRICAN AMERICAN: 54 mL/min — AB (ref >90)
Glucose, Bld: 120 mg/dL — ABNORMAL HIGH (ref 70–99)
Potassium: 4.3 mEq/L (ref 3.7–5.3)
SODIUM: 141 meq/L (ref 137–147)

## 2014-04-14 LAB — CBC
HCT: 30.3 % — ABNORMAL LOW (ref 39.0–52.0)
Hemoglobin: 10 g/dL — ABNORMAL LOW (ref 13.0–17.0)
MCH: 30.3 pg (ref 26.0–34.0)
MCHC: 33 g/dL (ref 30.0–36.0)
MCV: 91.8 fL (ref 78.0–100.0)
PLATELETS: 239 10*3/uL (ref 150–400)
RBC: 3.3 MIL/uL — ABNORMAL LOW (ref 4.22–5.81)
RDW: 14.2 % (ref 11.5–15.5)
WBC: 14.4 10*3/uL — AB (ref 4.0–10.5)

## 2014-04-14 LAB — TROPONIN I: Troponin I: <0.3 ng/mL (ref <0.30)

## 2014-04-14 MED ORDER — HEPARIN (PORCINE) IN NACL 100-0.45 UNIT/ML-% IJ SOLN
900.0000 [IU]/h | INTRAMUSCULAR | Status: DC
  Administered 2014-04-14: 950 [IU]/h via INTRAVENOUS
  Administered 2014-04-15: 1050 [IU]/h via INTRAVENOUS
  Administered 2014-04-17 – 2014-04-19 (×2): 1150 [IU]/h via INTRAVENOUS
  Filled 2014-04-14 (×7): qty 250

## 2014-04-14 NOTE — Progress Notes (Signed)
Echocardiogram 2D Echocardiogram has been performed.  Doyle Askew 04/14/2014, 11:30 AM

## 2014-04-14 NOTE — Progress Notes (Signed)
ANTICOAGULATION CONSULT NOTE - Follow Up Consult  Pharmacy Consult for heparin Indication: pulmonary embolus  Allergies  Allergen Reactions  . Ciprofloxacin Rash  . Dapsone Other (See Comments)    Lowers WBC count?? "made me very sick"    Patient Measurements: Height: 5\' 9"  (175.3 cm) Weight: 185 lb (83.915 kg) IBW/kg (Calculated) : 70.7 Heparin Dosing Weight: 83.9 kg  Vital Signs: Temp: 98 F (36.7 C) (10/04 0504) Temp Source: Oral (10/04 0504) BP: 147/52 mmHg (10/04 0504) Pulse Rate: 57 (10/04 0504)  Labs:  Recent Labs  04/13/14 1023 04/13/14 1414 04/13/14 1935 04/13/14 2300 04/14/14 0335 04/14/14 0713  HGB 10.8*  --   --   --  10.0*  --   HCT 32.2*  --   --   --  30.3*  --   PLT 253  --   --   --  239  --   HEPARINUNFRC  --   --   --  0.82*  --  1.02*  CREATININE 1.10  --   --   --  1.17  --   TROPONINI  --  <0.30 <0.30  --  <0.30  --     Estimated Creatinine Clearance: 44.5 ml/min (by C-G formula based on Cr of 1.17).   Medications:  Scheduled:  . clopidogrel  75 mg Oral Daily  . pantoprazole  40 mg Oral Daily  . polyethylene glycol  17 g Oral QHS  . simvastatin  10 mg Oral QHS  . sodium chloride  3 mL Intravenous Q12H   Infusions:  . heparin 1,200 Units/hr (04/14/14 0175)    Assessment: 80 yo m admitted on 10/3 with CP. D-dimer found to be elevated and CT revealed bilateral PE's with right heart strain.  Pharmacy is consulted to begin heparin.  HL this AM was supratherapeutic at 1.02.  Called the nurse and no issues or bleeding noted. I instructed the nurse to hold the infusion for 1 hour and starting back at 0900, decrease the infusion to 950 units/hr which is a decrease of ~3 units/kg/hr.  Will order a 8-hr HL for 1600. Hgb 10 (down from 10.8), plts 239, no s/s of bleeding per RN.  Goal of Therapy:  Heparin level 0.3-0.7 units/ml Monitor platelets by anticoagulation protocol: Yes   Plan:  Hold heparin infusion for 1 hour Restart heparin  infusion at 950 units/hr at 0900 8-hr HL @ 1600 Monitor hgb/plts, s/s of bleeding, clinical course  Arhan Mcmanamon L. Nicole Kindred, PharmD Clinical Pharmacy Resident Pager: [phone] 04/14/2014 8:06 AM

## 2014-04-14 NOTE — Progress Notes (Signed)
VASCULAR LAB PRELIMINARY  PRELIMINARY  PRELIMINARY  PRELIMINARY  Bilateral lower extremity venous duplex  completed.    Preliminary report:  Right:  Superficial thrombosis noted in the GSV from the SFJ through the mid-distal thigh.  CFV not involved.  Bilateral:  No evidence of DVT.       Ila Landowski, RVT 04/14/2014, 3:20 PM

## 2014-04-14 NOTE — Progress Notes (Addendum)
PATIENT DETAILS Name: Larry Huffman Age: 80 y.o. Sex: male Date of Birth: April 08, 1924 Admit Date: 04/13/2014 Admitting Physician Verlee Monte, MD WSF:[id number], Lynelle Smoke, MD  Subjective: Feels better-less pleuritic pain today. Denies any shortness of breath today.Patient is hard of hearing.  Assessment/Plan: Principal Problem:   Pulmonary embolism - Admitted with chest pain/SOB- CT angiogram demonstrated submassive PE. No clinical evidence of right ventricular strain and remains hemodynamically stable. 2-D echocardiogram-(poor quality) unable to evaluate right ventricular strain. Irrespective, given advanced age/limited ambulation-do not think patient is a candidate for catheter guided thrombolysis- suspect limited benefit, at this point. I would just treat patient with anticoagulation. After discussion with patient, it appears that he has a remote history of DVT and was on Coumadin at some point. Difficult situation- would benefit from long-term anti-coagulation given second episode of venous thromboembolism. Suspect, venous embolism may have been triggered by recent back surgery. - Await arrival of family-we'll then discuss long-term anticoagulation therapy-Coumadin vs. Newer novel anticoagulants (Eliquis). Will go ahead and get a Lower Ext Doppler. Continue with IV Heparin gtt for now.  Active Problems: Leukocytosis  -Likely secondary to stress reaction from the acute PE.  -CT did not show any evidence of pneumonia, urinalysis is negative.  -Follow closely  Chest pain  -Pleuritic chest pain secondary to PE, negative cardiac enzymes.Resolving with IV Heparin  Hx of CAD  -With history of PCI (CFX DES 7/07, LAD DES 10/08), patient was on aspirin and Plavix, continue Plavix and hold aspirin. Since patient will be on anticoagulation, given advanced age, suspect we can discontinue both aspirin and Plavix. Will discuss with cardiology -Negative cardiac enzymes,  2-D echocardiogram-shows  preserved EF  Hypertension - Antihypertensive medications held on admission-resume amlodipine at 5 mg daily  Spinal stenosis, lumbar region, with neurogenic claudication-s/p recent L4-5 decompression  -stable  Disposition: Remain inpatient  DVT Prophylaxis: Not needed-on Heparin gtt  Code Status: Full code   Family Communication None-awaiting arrival of daughter  Addendum-3:20 pm-Spoke with [name] over the phone-[phone]-explained above plan, coumadin vs Novel agents discussed, she will like more information. She will come in person tomorrow to further discuss.  Procedures:  None  CONSULTS:  None  Time spent 40 minutes-which includes 50% of the time with face-to-face with patient/ family and coordinating care related to the above assessment and plan.  MEDICATIONS: Scheduled Meds: . clopidogrel  75 mg Oral Daily  . pantoprazole  40 mg Oral Daily  . polyethylene glycol  17 g Oral QHS  . simvastatin  10 mg Oral QHS  . sodium chloride  3 mL Intravenous Q12H   Continuous Infusions: . heparin 950 Units/hr (04/14/14 0859)   PRN Meds:.acetaminophen, acetaminophen, fluticasone, HYDROcodone-acetaminophen, morphine injection  Antibiotics: Anti-infectives   None       PHYSICAL EXAM: Vital signs in last 24 hours: Filed Vitals:   04/13/14 2027 04/13/14 2133 04/13/14 2351 04/14/14 0504  BP:    147/52  Pulse:   68 57  Temp: 100 F (37.8 C) 99.5 F (37.5 C)  98 F (36.7 C)  TempSrc: Oral Oral  Oral  Resp:   18 18  Height:      Weight:      SpO2:    96%    Weight change:  Filed Weights   04/13/14 0918  Weight: 83.915 kg (185 lb)   Body mass index is 27.31 kg/(m^2).   Gen Exam: Awake and alert with clear speech.   Neck: Supple, No  JVD.   Chest: B/L Clear.   CVS: S1 S2 Regular, no murmurs.  Abdomen: soft, BS +, non tender, non distended.  Extremities: no edema, lower extremities warm to touch. Neurologic: Non Focal.   Skin: No Rash.     Wounds: N/A.   Intake/Output from previous day:  Intake/Output Summary (Last 24 hours) at 04/14/14 1234 Last data filed at 04/14/14 0900  Gross per 24 hour  Intake    480 ml  Output    850 ml  Net   -370 ml     LAB RESULTS: CBC  Recent Labs Lab 04/13/14 1023 04/14/14 0335  WBC 15.3* 14.4*  HGB 10.8* 10.0*  HCT 32.2* 30.3*  PLT 253 239  MCV 92.0 91.8  MCH 30.9 30.3  MCHC 33.5 33.0  RDW 14.2 14.2  LYMPHSABS 2.6  --   MONOABS 2.4*  --   EOSABS 0.2  --   BASOSABS 0.2*  --     Chemistries   Recent Labs Lab 04/13/14 1023 04/14/14 0335  NA 137 141  K 4.1 4.3  CL 102 107  CO2 22 22  GLUCOSE 113* 120*  BUN 25* 28*  CREATININE 1.10 1.17  CALCIUM 9.2 8.5    CBG: No results found for this basename: GLUCAP,  in the last 168 hours  GFR Estimated Creatinine Clearance: 44.5 ml/min (by C-G formula based on Cr of 1.17).  Coagulation profile No results found for this basename: INR, PROTIME,  in the last 168 hours  Cardiac Enzymes  Recent Labs Lab 04/13/14 1414 04/13/14 1935 04/14/14 0335  TROPONINI <0.30 <0.30 <0.30    No components found with this basename: POCBNP,   Recent Labs  04/13/14 1023  DDIMER 3.71*   No results found for this basename: HGBA1C,  in the last 72 hours No results found for this basename: CHOL, HDL, LDLCALC, TRIG, CHOLHDL, LDLDIRECT,  in the last 72 hours  Recent Labs  04/13/14 1414  TSH 2.090   No results found for this basename: VITAMINB12, FOLATE, FERRITIN, TIBC, IRON, RETICCTPCT,  in the last 72 hours No results found for this basename: LIPASE, AMYLASE,  in the last 72 hours  Urine Studies No results found for this basename: UACOL, UAPR, USPG, UPH, UTP, UGL, UKET, UBIL, UHGB, UNIT, UROB, ULEU, UEPI, UWBC, URBC, UBAC, CAST, CRYS, UCOM, BILUA,  in the last 72 hours  MICROBIOLOGY: No results found for this or any previous visit (from the past 240 hour(s)).  RADIOLOGY STUDIES/RESULTS: Dg Chest 2 View  04/13/2014    CLINICAL DATA:  Left-sided chest pain since yesterday semi: History of coronary artery disease with stent placement, hypertension, and previous tobacco use.  EXAM: CHEST  2 VIEW  COMPARISON:  PA and lateral chest of February 04, 2014  FINDINGS: The right lung is well-expanded and clear. On the left there is mild volume loss with apparent elevation of the hemidiaphragm. There is haziness of the left lateral and posterior costophrenic angles. There is no alveolar infiltrate. The cardiac silhouette is mildly enlarged though stable. There is tortuosity of the descending thoracic aorta. The pulmonary vascularity is not engorged. There are degenerative changes of the shoulders.  IMPRESSION: There is mild volume loss on the left with apparent elevation of the hemidiaphragm and subsegmental atelectasis in the costophrenic gutters. There is no evidence of pneumonia, pulmonary edema, nor of a significant pleural effusion.   Electronically Signed   By: David  Martinique   On: 04/13/2014 11:05   Ct Angio Chest Pe W/cm &/  or Wo Cm  04/13/2014   CLINICAL DATA:  r/o pe, left chest pain, elevated D-dimer, elevated white blood cell count  EXAM: CT ANGIOGRAPHY CHEST WITH CONTRAST  TECHNIQUE: Multidetector CT imaging of the chest was performed using the standard protocol during bolus administration of intravenous contrast. Multiplanar CT image reconstructions and MIPs were obtained to evaluate the vascular anatomy.  CONTRAST:  71mL OMNIPAQUE IOHEXOL 350 MG/ML SOLN  COMPARISON:  05/29/2010  FINDINGS: Mild right ventricular dilatation, RV/LV ratio 1.2. Segmental partially occlusive pulmonary emboli in right upper and middle lobe branches as well as in the central right lower lobe branch of the pulmonary artery. On the left, there is a single segmental embolus in the anterior basal segment lower lobe branch, partially occlusive. Patent superior and inferior pulmonary veins bilaterally. Mild left atrial enlargement. Patchy coronary and aortic  calcifications. Classic 3 vessel brachiocephalic arterial origin anatomy from of the aortic arch without proximal stenosis. No aortic dissection, aneurysm, or stenosis.  Small pleural effusions left greater than right. Sub cm prevascular, AP window, and right paratracheal lymph nodes. No hilar adenopathy. Dependent atelectasis posteriorly in both lower lobes left greater than right. No confluent airspace consolidation or overt interstitial edema. Old lower thoracic compression deformity. Spurring across multiple contiguous levels in the mid and lower thoracic spine. Subcentimeter partially calcified stone in the dependent aspect of the gallbladder. Remainder of visualized upper abdomen unremarkable.  Review of the MIP images confirms the above findings.  IMPRESSION: 1. Bilateral central and segmental partially occlusive pulmonary emboli with CT evidence of right heartstrain (RV/LV Ratio = 1.2) consistent with at least submassive (intermediate risk)PE. The presence of right heart strain has been associated with anincreased risk of morbidity and mortality. Consultation with [facility] is recommended. Critical Value/emergent results were called by telephone at the time of interpretation on 04/13/2014 at 1:07 pm to Dr. Blanchie Dessert , who verbally acknowledged these results. 2. Atherosclerosis, including aortic and coronary artery disease. Please note that although the presence of coronary artery calcium documents the presence of coronary artery disease, the severity of this disease and any potential stenosis cannot be assessed on this non-gated CT examination. Assessment for potential risk factor modification, dietary therapy or pharmacologic therapy may be warranted, if clinically indicated. 3. Cholelithiasis   Electronically Signed   By: Arne Cleveland M.D.   On: 04/13/2014 13:09    Oren Binet, MD  [facility] Pager:[phone]  If 7PM-7AM, please contact  night-coverage [email or web address] Password [email or web address] 04/14/2014, 12:34 PM   LOS: 1 day   **Disclaimer: This note may have been dictated with voice recognition software. Similar sounding words can inadvertently be transcribed and this note may contain transcription errors which may not have been corrected upon publication of note.**

## 2014-04-14 NOTE — Progress Notes (Signed)
Utilization Review Completed.   Karema Tocci, RN, BSN Nurse Case Manager  

## 2014-04-14 NOTE — Progress Notes (Signed)
Pharmacy: [facility]  Patient is an 80 y.o Mo on heparin for acute bilateral PE with right heart strain.  Heparin level now back therapeutic at 0.31 (goal 0.3-0.7) but is at lower end of therapeutic range.  No bleeding documented.  Plan: 1) increase heparin rate to 1050 units/hr (to get to upper end of range d/t clot burden) 2) check 8 hr heparin level  Dia Sitter, PharmD, BCPS

## 2014-04-15 DIAGNOSIS — N183 Chronic kidney disease, stage 3 unspecified: Secondary | ICD-10-CM | POA: Diagnosis present

## 2014-04-15 DIAGNOSIS — I5032 Chronic diastolic (congestive) heart failure: Secondary | ICD-10-CM

## 2014-04-15 LAB — CBC
HEMATOCRIT: 30.5 % — AB (ref 39.0–52.0)
HEMOGLOBIN: 10.4 g/dL — AB (ref 13.0–17.0)
MCH: 30.9 pg (ref 26.0–34.0)
MCHC: 34.1 g/dL (ref 30.0–36.0)
MCV: 90.5 fL (ref 78.0–100.0)
Platelets: 233 10*3/uL (ref 150–400)
RBC: 3.37 MIL/uL — AB (ref 4.22–5.81)
RDW: 14 % (ref 11.5–15.5)
WBC: 13.7 10*3/uL — ABNORMAL HIGH (ref 4.0–10.5)

## 2014-04-15 LAB — HEPARIN LEVEL (UNFRACTIONATED)
Heparin Unfractionated: 0.38 IU/mL (ref 0.30–0.70)
Heparin Unfractionated: 0.28 IU/mL — ABNORMAL LOW (ref 0.30–0.70)
Heparin Unfractionated: 0.38 IU/mL (ref 0.30–0.70)

## 2014-04-15 LAB — PRO B NATRIURETIC PEPTIDE: Pro B Natriuretic peptide (BNP): 729 pg/mL — ABNORMAL HIGH (ref 0–450)

## 2014-04-15 MED ORDER — PATIENT'S GUIDE TO USING COUMADIN BOOK
Freq: Once | Status: AC
  Administered 2014-04-15: 17:00:00
  Filled 2014-04-15: qty 1

## 2014-04-15 MED ORDER — NAPHAZOLINE HCL 0.1 % OP SOLN
1.0000 [drp] | Freq: Four times a day (QID) | OPHTHALMIC | Status: DC | PRN
  Filled 2014-04-15: qty 15

## 2014-04-15 MED ORDER — WARFARIN - PHARMACIST DOSING INPATIENT
Freq: Every day | Status: DC
  Administered 2014-04-18: 18:00:00

## 2014-04-15 MED ORDER — VITAMIN B6 250 MG PO TABS: 250.0000 mg | ORAL_TABLET | Freq: Every day | ORAL | Status: DC

## 2014-04-15 MED ORDER — WARFARIN VIDEO
Freq: Once | Status: AC
  Administered 2014-04-15: 17:00:00

## 2014-04-15 MED ORDER — VITAMIN B-6 100 MG PO TABS
100.0000 mg | ORAL_TABLET | Freq: Every day | ORAL | Status: DC
  Administered 2014-04-15 – 2014-04-20 (×6): 100 mg via ORAL
  Filled 2014-04-15 (×6): qty 1

## 2014-04-15 MED ORDER — WARFARIN SODIUM 5 MG PO TABS
5.0000 mg | ORAL_TABLET | Freq: Once | ORAL | Status: AC
  Administered 2014-04-15: 5 mg via ORAL
  Filled 2014-04-15: qty 1

## 2014-04-15 NOTE — Progress Notes (Signed)
Pharmacy: [facility]  Patient is an 80 y.o M on heparin for acute bilateral PE with right heart strain.  Heparin level now back therapeutic at 0.38  Plan: 1) Cont heparin rate at 1050 units/hr

## 2014-04-15 NOTE — Progress Notes (Addendum)
ANTICOAGULATION CONSULT NOTE - Follow Up Consult  Pharmacy Consult for heparin  Indication: pulmonary embolus  Allergies  Allergen Reactions  . Ciprofloxacin Rash  . Dapsone Other (See Comments)    Lowers WBC count?? "made me very sick"    Patient Measurements: Height: 5\' 9"  (175.3 cm) Weight: 185 lb (83.915 kg) IBW/kg (Calculated) : 70.7 Heparin Dosing Weight: 84 kg  Vital Signs: Temp: 98.3 F (36.8 C) (10/05 0446) Temp Source: Axillary (10/05 0446) BP: 158/52 mmHg (10/05 0446) Pulse Rate: 65 (10/05 0446)  Labs:  Recent Labs  04/13/14 1023 04/13/14 1414 04/13/14 1935  04/14/14 0335  04/14/14 1541 04/15/14 0053 04/15/14 1030  HGB 10.8*  --   --   --  10.0*  --   --  10.4*  --   HCT 32.2*  --   --   --  30.3*  --   --  30.5*  --   PLT 253  --   --   --  239  --   --  233  --   HEPARINUNFRC  --   --   --   < >  --   < > 0.31 0.38 0.28*  CREATININE 1.10  --   --   --  1.17  --   --   --   --   TROPONINI  --  <0.30 <0.30  --  <0.30  --   --   --   --   < > = values in this interval not displayed.  Estimated Creatinine Clearance: 44.5 ml/min (by C-G formula based on Cr of 1.17).   Assessment: Patient is an 80 y.o Mo on heparin for acute bilateral PE with right heart strain. Heparin level dropped bellow goal at 0.28 this morning, currently on 1050 units/hr. Doppler neg for DVT. CBC stable, no bleeding documented, no issue or interruption with infusion  Goal of Therapy:  Heparin level 0.3-0.7 units/ml Monitor platelets by anticoagulation protocol: Yes   Plan:  - Increase heparin rate to 1150 units/hr - f/u heparin level at 2000 - daily heparin level and cbc - f/u plan for long-term anticoagulation  Maryanna Shape, PharmD, BCPS  Clinical Pharmacist  Pager: [phone]   04/15/2014,11:25 AM  Addendum:  Will also start coumadin tonight.  Plan: Coumadin 5mg  po x 1 tonight F/u daily PT/INR  Maryanna Shape, PharmD, BCPS  Clinical Pharmacist  Pager: [phone]

## 2014-04-15 NOTE — Progress Notes (Signed)
04/15/2014 4:14 PM Nursing note Pt. Viewed video #[phone] on coumadin. Questions and concerns addressed.  Cyncere Ruhe, Arville Lime

## 2014-04-15 NOTE — Progress Notes (Addendum)
Nutrition Brief Note  Patient identified on the Malnutrition Screening Tool (MST) Report for recent weight lost without trying and eating poorly because of a decreased appetite.  Per wt readings below, patient's weight has been stable.  Wt Readings from Last 15 Encounters:  04/13/14 185 lb (83.915 kg)  02/08/14 184 lb (83.462 kg)  02/08/14 184 lb (83.462 kg)  02/04/14 184 lb 4.8 oz (83.598 kg)  09/14/13 184 lb 4.8 oz (83.598 kg)  03/16/13 186 lb (84.369 kg)  08/16/12 173 lb (78.472 kg)  07/29/12 173 lb 4.5 oz (78.6 kg)  06/27/12 182 lb 4.8 oz (82.691 kg)    Body mass index is 27.31 kg/(m^2). Patient meets criteria for Overweight based on current BMI.   Current diet order is Heart Healthy, patient is consuming approximately 75-100% of meals at this time. Labs and medications reviewed.   No nutrition interventions warranted at this time. If nutrition issues arise, please consult RD.   Arthur Holms, RD, LDN Pager #: [phone] After-Hours Pager #: [phone]

## 2014-04-15 NOTE — Progress Notes (Signed)
ANTICOAGULATION CONSULT NOTE - Follow Up Consult  Pharmacy Consult for heparin  Indication: pulmonary embolus  Allergies  Allergen Reactions  . Ciprofloxacin Rash  . Dapsone Other (See Comments)    Lowers WBC count?? "made me very sick"    Patient Measurements: Height: 5\' 9"  (175.3 cm) Weight: 185 lb (83.915 kg) IBW/kg (Calculated) : 70.7 Heparin Dosing Weight: 84 kg  Vital Signs: Temp: 98.8 F (37.1 C) (10/05 1500) Temp Source: Axillary (10/05 1500) BP: 156/50 mmHg (10/05 1500) Pulse Rate: 78 (10/05 1500)  Labs:  Recent Labs  04/13/14 1023 04/13/14 1414 04/13/14 1935  04/14/14 0335  04/15/14 0053 04/15/14 1030 04/15/14 1939  HGB 10.8*  --   --   --  10.0*  --  10.4*  --   --   HCT 32.2*  --   --   --  30.3*  --  30.5*  --   --   PLT 253  --   --   --  239  --  233  --   --   HEPARINUNFRC  --   --   --   < >  --   < > 0.38 0.28* 0.38  CREATININE 1.10  --   --   --  1.17  --   --   --   --   TROPONINI  --  <0.30 <0.30  --  <0.30  --   --   --   --   < > = values in this interval not displayed.  Estimated Creatinine Clearance: 44.5 ml/min (by C-G formula based on Cr of 1.17).   Assessment: Patient is an 80 y.o Mo on heparin for acute bilateral PE with right heart strain. Heparin level dropped bellow goal at 0.28 this morning, currently on 1050 units/hr. Doppler neg for DVT. CBC stable, no bleeding documented, no issue or interruption with infusion  PM f/u: heparin level now within goal range on heparin 1150 units/hr.  No bleeding or complications noted.  Goal of Therapy:  Heparin level 0.3-0.7 units/ml Monitor platelets by anticoagulation protocol: Yes   Plan:  - Continue IV heparin at 1150 units/hr - Daily heparin level and cbc - f/u plan for long-term anticoagulation  Uvaldo Rising, BCPS  Clinical Pharmacist Pager [phone]  04/15/2014 8:13 PM

## 2014-04-15 NOTE — Progress Notes (Signed)
PROGRESS NOTE  Larry Huffman BHA:[id number] DOB: Nov 29, 1923 DOA: 04/13/2014 PCP: Florina Ou, MD  HPI/Recap of past 24 hours: Patient is an 80 year old male with past medical history of obstructive sleep apnea, CAD and spinal stenosis who was admitted to the hospitalist service on 10/3 for submassive pulmonary embolus after coming in complaining of chest pain.  No issues overnight. Started on IV heparin. Starting Coumadin this evening.  Assessment/Plan: Principal Problem:   Pulmonary embolism: Active Problems:   Chest pain: Secondary PE.    CAD - CFX DES 7/07, LAD DES 10/08, cath 11/11- medical Rx: Stable medical issue.    Peripheral arterial disease: History of left carotid endarterectomy: Stable medical issue.    Obstructive sleep apnea: Uses CPAP   Essential hypertension: Stable.    Spinal stenosis, lumbar region, with neurogenic claudication: Stable.    Chronic diastolic heart failure: Incidentally found during pulmonary embolus workup. BNP ordered.  Chronic kidney disease stage III: Given his kidney function, and concerned about the high risk for bleeding given his current GFR. We'll plan to start Coumadin.    Code Status: Full code  Family Communication: Left message with daughter  Disposition Plan: Start Coumadin tonight, awaiting therapeutic INR   Consultants:  None  Procedures:  2-D echo done 97/3: Grade 1 diastolic dysfunction  Antibiotics:  None  HPI/Subjective: Patient resting comfortably  Objective: BP 158/52  Pulse 65  Temp(Src) 98.3 F (36.8 C) (Axillary)  Resp 20  Ht 5\' 9"  (1.753 m)  Wt 83.915 kg (185 lb)  BMI 27.31 kg/m2  SpO2 96%  Intake/Output Summary (Last 24 hours) at 04/15/14 1423 Last data filed at 04/15/14 0900  Gross per 24 hour  Intake 925.49 ml  Output   1225 ml  Net -299.51 ml   Filed Weights   04/13/14 0918  Weight: 83.915 kg (185 lb)    Exam:   General:   Patient resting comfortably, no acute  distress  Cardiovascular: Regular rate and rhythm, Z3-G9, soft 2/6 systolic ejection murmur  Respiratory: Clear to auscultation bilaterally  Abdomen: Soft, nontender, nondistended, positive bowel sounds  Musculoskeletal: No clubbing or cyanosis, trace edema bilaterally   Data Reviewed: Basic Metabolic Panel:  Recent Labs Lab 04/13/14 1023 04/14/14 0335  NA 137 141  K 4.1 4.3  CL 102 107  CO2 22 22  GLUCOSE 113* 120*  BUN 25* 28*  CREATININE 1.10 1.17  CALCIUM 9.2 8.5   Liver Function Tests: No results found for this basename: AST, ALT, ALKPHOS, BILITOT, PROT, ALBUMIN,  in the last 168 hours No results found for this basename: LIPASE, AMYLASE,  in the last 168 hours No results found for this basename: AMMONIA,  in the last 168 hours CBC:  Recent Labs Lab 04/13/14 1023 04/14/14 0335 04/15/14 0053  WBC 15.3* 14.4* 13.7*  NEUTROABS 9.9*  --   --   HGB 10.8* 10.0* 10.4*  HCT 32.2* 30.3* 30.5*  MCV 92.0 91.8 90.5  PLT 253 239 233   Cardiac Enzymes:    Recent Labs Lab 04/13/14 1414 04/13/14 1935 04/14/14 0335  TROPONINI <0.30 <0.30 <0.30   BNP (last 3 results) No results found for this basename: PROBNP,  in the last 8760 hours CBG: No results found for this basename: GLUCAP,  in the last 168 hours  No results found for this or any previous visit (from the past 240 hour(s)).   Studies: No results found.  Scheduled Meds: . clopidogrel  75 mg Oral Daily  . pantoprazole  40  mg Oral Daily  . polyethylene glycol  17 g Oral QHS  . simvastatin  10 mg Oral QHS  . sodium chloride  3 mL Intravenous Q12H    Continuous Infusions: . heparin 1,150 Units/hr (04/15/14 1131)     Time spent: 25 minutes  [facility] Pager [phone]. If 7PM-7AM, please contact night-coverage at [email or web address], password [facility] 04/15/2014, 2:23 PM  LOS: 2 days

## 2014-04-16 LAB — CBC
HEMATOCRIT: 31.4 % — AB (ref 39.0–52.0)
Hemoglobin: 10.6 g/dL — ABNORMAL LOW (ref 13.0–17.0)
MCH: 30.7 pg (ref 26.0–34.0)
MCHC: 33.8 g/dL (ref 30.0–36.0)
MCV: 91 fL (ref 78.0–100.0)
Platelets: 254 10*3/uL (ref 150–400)
RBC: 3.45 MIL/uL — AB (ref 4.22–5.81)
RDW: 14.1 % (ref 11.5–15.5)
WBC: 13.3 10*3/uL — AB (ref 4.0–10.5)

## 2014-04-16 LAB — PROTIME-INR
INR: 1.1 (ref 0.00–1.49)
Prothrombin Time: 14.2 seconds (ref 11.6–15.2)

## 2014-04-16 LAB — HEPARIN LEVEL (UNFRACTIONATED): Heparin Unfractionated: 0.4 IU/mL (ref 0.30–0.70)

## 2014-04-16 MED ORDER — WARFARIN SODIUM 7.5 MG PO TABS
7.5000 mg | ORAL_TABLET | Freq: Once | ORAL | Status: AC
  Administered 2014-04-16: 7.5 mg via ORAL
  Filled 2014-04-16: qty 1

## 2014-04-16 MED ORDER — INFLUENZA VAC SPLIT QUAD 0.5 ML IM SUSY
0.5000 mL | PREFILLED_SYRINGE | INTRAMUSCULAR | Status: AC
  Administered 2014-04-17: 0.5 mL via INTRAMUSCULAR
  Filled 2014-04-16: qty 0.5

## 2014-04-16 NOTE — Progress Notes (Signed)
ANTICOAGULATION CONSULT NOTE - Follow Up Consult  Pharmacy Consult for heparin/coumadin  Indication: pulmonary embolus  Allergies  Allergen Reactions  . Ciprofloxacin Rash  . Dapsone Other (See Comments)    Lowers WBC count?? "made me very sick"    Patient Measurements: Height: 5\' 9"  (175.3 cm) Weight: 185 lb (83.915 kg) IBW/kg (Calculated) : 70.7 Heparin Dosing Weight: 84 kg  Vital Signs: Temp: 97.6 F (36.4 C) (10/06 0400) Temp Source: Oral (10/06 0400) BP: 146/48 mmHg (10/06 0400) Pulse Rate: 63 (10/06 0400)  Labs:  Recent Labs  04/13/14 1414 04/13/14 1935  04/14/14 0335  04/15/14 0053 04/15/14 1030 04/15/14 1939 04/16/14 0343  HGB  --   --   < > 10.0*  --  10.4*  --   --  10.6*  HCT  --   --   --  30.3*  --  30.5*  --   --  31.4*  PLT  --   --   --  239  --  233  --   --  254  LABPROT  --   --   --   --   --   --   --   --  14.2  INR  --   --   --   --   --   --   --   --  1.10  HEPARINUNFRC  --   --   < >  --   < > 0.38 0.28* 0.38 0.40  CREATININE  --   --   --  1.17  --   --   --   --   --   TROPONINI <0.30 <0.30  --  <0.30  --   --   --   --   --   < > = values in this interval not displayed.  Estimated Creatinine Clearance: 44.5 ml/min (by C-G formula based on Cr of 1.17).   Assessment: Patient is an 80 y.o M on heparin bridge to coumadin D#2 for acute bilateral PE with right heart strain. Heparin level 0.4 at goal this morning, currently on 1150 units/hr. INR 1.1, low as expected after one dose of coumadin 5mg . CBC stable, no bleeding documented.  Goal of Therapy:  Heparin level 0.3-0.7 units/ml Monitor platelets by anticoagulation protocol: Yes   Plan:  - Continue heparin 1150 units/hr - Coumadin 7.5 mg po x 1 - daily PT/INR, heparin level and cbc  Maryanna Shape, PharmD, BCPS  Clinical Pharmacist  Pager: [phone]   04/16/2014,11:09 AM

## 2014-04-16 NOTE — Evaluation (Addendum)
Physical Therapy Evaluation Patient Details Name: Larry Huffman MRN: [id number] DOB: 04-23-1924 Today's Date: 04/16/2014   History of Present Illness  Larry Huffman is a 80 y.o. male with past medical history of hypertension, PVD and chronic back pain with recent L4-5 decompression and microdiscectomy. Patient given to the hospital because of acute chest pain. Patient was in his usual state of health until yesterday when he started to have left-sided chest pain, no radiation, sharp and noticed the pain increases with taking deep breath.  Clinical Impression  Pt admitted with submassive PE with heart strain. Pt currently with functional limitations due to the deficits listed below (see PT Problem List). Pt will benefit from skilled PT to increase their independence and safety with mobility to allow discharge to the venue listed below. At this time recommend HHPT, but depending on length of stay, he may progress well enough to not need HHPT.  Pt demonstrating difficulty with ADLs (including wiping self after BM and brushing teeth) with R UE.  Recommend OT consult.     Follow Up Recommendations Home health PT    Equipment Recommendations  None recommended by PT    Recommendations for Other Services   OT consult    Precautions / Restrictions Precautions Precautions: None Restrictions Weight Bearing Restrictions: No      Mobility  Bed Mobility Overal bed mobility: Needs Assistance Bed Mobility: Supine to Sit     Supine to sit: Min assist     General bed mobility comments: Needed A to get trunk upright by pulling on PT's hand  Transfers Overall transfer level: Needs assistance   Transfers: Sit to/from Stand Sit to Stand: Min guard         General transfer comment: stood from bed and low toilet  Ambulation/Gait Ambulation/Gait assistance: Min guard;Supervision Ambulation Distance (Feet): 200 Feet Assistive device: None;Rolling walker (2 wheeled) Gait Pattern/deviations:  Step-through pattern Gait velocity: WNL   General Gait Details: Ambulated initially with RW due to pt c/o R knee, but then ambulated without it.  Pt ambulated smoother without RW and no LOB noted. o2 99% on room air.  Stairs            Wheelchair Mobility    Modified Rankin (Stroke Patients Only)       Balance Overall balance assessment: Needs assistance   Sitting balance-Leahy Scale: Good     Standing balance support: During functional activity Standing balance-Leahy Scale: Fair Standing balance comment: Fair +               High Level Balance Comments: Pt brushing teeth at sink. Pt is R handed and having difficulty managing toothbrush with R hand.             Pertinent Vitals/Pain Pain Assessment: 0-10 Pain Score: 1  Pain Descriptors / Indicators: Sore Pain Intervention(s): Monitored during session    Home Living Family/patient expects to be discharged to:: Private residence Living Arrangements: Children (Daughter works 10-12 hours/day) Available Help at Discharge: Family Type of Home: House Home Access: Ramped entrance     Home Layout: Two level;Able to live on main level with bedroom/bathroom Home Equipment: Shower seat;Walker - 2 wheels;Bedside commode      Prior Function Level of Independence: Independent         Comments: Still driving     Hand Dominance   Dominant Hand: Right    Extremity/Trunk Assessment   Upper Extremity Assessment: RUE deficits/detail RUE Deficits / Details: R UE selling noted  and decreased grip         Lower Extremity Assessment: Overall WFL for tasks assessed;Generalized weakness      Cervical / Trunk Assessment: Normal  Communication   Communication: HOH  Cognition Arousal/Alertness: Awake/alert Behavior During Therapy: WFL for tasks assessed/performed Overall Cognitive Status: Within Functional Limits for tasks assessed                      General Comments General comments (skin  integrity, edema, etc.): Pt pleasant and cooperative throughout session.    Exercises        Assessment/Plan    PT Assessment Patient needs continued PT services  PT Diagnosis Difficulty walking   PT Problem List Decreased balance;Decreased strength;Decreased mobility  PT Treatment Interventions Gait training;Functional mobility training;Therapeutic exercise;Therapeutic activities;Balance training   PT Goals (Current goals can be found in the Care Plan section) Acute Rehab PT Goals Patient Stated Goal: Get stronger and back home PT Goal Formulation: With patient Time For Goal Achievement: 04/30/14 Potential to Achieve Goals: Good    Frequency Min 3X/week   Barriers to discharge        Co-evaluation               End of Session Equipment Utilized During Treatment: Gait belt Activity Tolerance: Patient tolerated treatment well Patient left: in chair;with nursing/sitter in room (CNA assisting for bath.) Nurse Communication: Mobility status         Time: [phone] PT Time Calculation (min): 45 min   Charges:   PT Evaluation $Initial PT Evaluation Tier I: 1 Procedure PT Treatments $Gait Training: 8-22 mins $Therapeutic Activity: 8-22 mins   PT G Codes:          Sharonica Kraszewski LUBECK 04/16/2014, 9:50 AM

## 2014-04-16 NOTE — Discharge Instructions (Addendum)
Information on my medicine - Coumadin   (Warfarin)  This medication education was reviewed with me or my healthcare representative as part of my discharge preparation.  The pharmacist that spoke with me during my hospital stay was:  Manley Mason, [facility]  Why was Coumadin prescribed for you? Coumadin was prescribed for you because you have a blood clot or a medical condition that can cause an increased risk of forming blood clots. Blood clots can cause serious health problems by blocking the flow of blood to the heart, lung, or brain. Coumadin can prevent harmful blood clots from forming. As a reminder your indication for Coumadin is:   Pulmonary Embolism Treatment  What test will check on my response to Coumadin? While on Coumadin (warfarin) you will need to have an INR test regularly to ensure that your dose is keeping you in the desired range. The INR (international normalized ratio) number is calculated from the result of the laboratory test called prothrombin time (PT).  If an INR APPOINTMENT HAS NOT ALREADY BEEN MADE FOR YOU please schedule an appointment to have this lab work done by your health care provider within 7 days. Your INR goal is usually a number between:  2 to 3  What  do you need to  know  About  COUMADIN? Take Coumadin (warfarin) exactly as prescribed by your healthcare provider about the same time each day.  DO NOT stop taking without talking to the doctor who prescribed the medication.  Stopping without other blood clot prevention medication to take the place of Coumadin may increase your risk of developing a new clot or stroke.  Get refills before you run out.  What do you do if you miss a dose? If you miss a dose, take it as soon as you remember on the same day then continue your regularly scheduled regimen the next day.  Do not take two doses of Coumadin at the same time.  Important Safety Information A possible side effect of Coumadin (Warfarin) is an increased risk of  bleeding. You should call your healthcare provider right away if you experience any of the following:   Bleeding from an injury or your nose that does not stop.   Unusual colored urine (red or dark brown) or unusual colored stools (red or black).   Unusual bruising for unknown reasons.   A serious fall or if you hit your head (even if there is no bleeding).  Some foods or medicines interact with Coumadin (warfarin) and might alter your response to warfarin. To help avoid this:   Eat a balanced diet, maintaining a consistent amount of Vitamin K.   Notify your provider about major diet changes you plan to make.   Avoid alcohol or limit your intake to 1 drink for women and 2 drinks for men per day. (1 drink is 5 oz. wine, 12 oz. beer, or 1.5 oz. liquor.)  Make sure that ANY health care provider who prescribes medication for you knows that you are taking Coumadin (warfarin).  Also make sure the healthcare provider who is monitoring your Coumadin knows when you have started a new medication including herbals and non-prescription products.  Coumadin (Warfarin)  Major Drug Interactions  Increased Warfarin Effect Decreased Warfarin Effect  Alcohol (large quantities) Antibiotics (esp. Septra/Bactrim, Flagyl, Cipro) Amiodarone (Cordarone) Aspirin (ASA) Cimetidine (Tagamet) Megestrol (Megace) NSAIDs (ibuprofen, naproxen, etc.) Piroxicam (Feldene) Propafenone (Rythmol SR) Propranolol (Inderal) Isoniazid (INH) Posaconazole (Noxafil) Barbiturates (Phenobarbital) Carbamazepine (Tegretol) Chlordiazepoxide (Librium) Cholestyramine (Questran) Griseofulvin Oral  Contraceptives Rifampin Sucralfate (Carafate) Vitamin K   Coumadin (Warfarin) Major Herbal Interactions  Increased Warfarin Effect Decreased Warfarin Effect  Garlic Ginseng Ginkgo biloba Coenzyme Q10 Green tea St. Johns wort    Coumadin (Warfarin) FOOD Interactions  Eat a consistent number of servings per week of foods HIGH in  Vitamin K (1 serving =  cup)  Collards (cooked, or boiled & drained) Kale (cooked, or boiled & drained) Mustard greens (cooked, or boiled & drained) Parsley *serving size only =  cup Spinach (cooked, or boiled & drained) Swiss chard (cooked, or boiled & drained) Turnip greens (cooked, or boiled & drained)  Eat a consistent number of servings per week of foods MEDIUM-HIGH in Vitamin K (1 serving = 1 cup)  Asparagus (cooked, or boiled & drained) Broccoli (cooked, boiled & drained, or raw & chopped) Brussel sprouts (cooked, or boiled & drained) *serving size only =  cup Lettuce, raw (green leaf, endive, romaine) Spinach, raw Turnip greens, raw & chopped   These websites have more information on Coumadin (warfarin):  FailFactory.se; VeganReport.com.au;     - follow up appointment for INR to be made with PCP on 10/12

## 2014-04-16 NOTE — Care Management Note (Unsigned)
    Page 1 of 1   04/19/2014     5:32:54 PM CARE MANAGEMENT NOTE 04/19/2014  Patient:  Larry Huffman, Larry Huffman   Account Number:  [id number]  Date Initiated:  04/16/2014  Documentation initiated by:  Tedric Leeth  Subjective/Objective Assessment:   Pt adm on 10/3 with bilateral PE. PTA, pt independent, lives alone.     Action/Plan:   PT recommending HH follow up at dc, and RW.  MD please order if you agree.   Anticipated DC Date:  04/18/2014   Anticipated DC Plan:  [place]  CM consult      Choice offered to / List presented to:             Status of service:  In process, will continue to follow Medicare Important Message given?  YES (If response is "NO", the following Medicare IM given date fields will be blank) Date Medicare IM given:  04/16/2014 Medicare IM given by:  Shevy Yaney Date Additional Medicare IM given:  04/19/2014 Additional Medicare IM given by:  Fleta Borgeson  Discharge Disposition:    Per UR Regulation:  Reviewed for med. necessity/level of care/duration of stay  If discussed at [place] of Stay Meetings, dates discussed:    Comments:

## 2014-04-16 NOTE — Progress Notes (Signed)
PROGRESS NOTE  Larry Huffman KZS:[id number] DOB: October 22, 1923 DOA: 04/13/2014 PCP: Florina Ou, MD  HPI/Recap of past 24 hours: Patient is an 81 year old male with past medical history of obstructive sleep apnea, CAD and spinal stenosis who was admitted to the hospitalist service on 10/3 for submassive pulmonary embolus after coming in complaining of chest pain.  Patient started on IV heparin.  Coumadin started on 10/5 evening.  Decided against newer anticoagulants given stage 3 CKD  Patient today doing well. This chest pain has much improved, leaving only residual soreness. Breathing comfortably.  Assessment/Plan: Principal Problem:   Pulmonary embolism:currently on heparin, Coumadin started on 10/5 evening. As per core measures, will make follow up appointment scheduled Active Problems:   Chest pain: Secondary PE.much improved    CAD - CFX DES 7/07, LAD DES 10/08, cath 11/11- medical Rx: Stable medical issue.    Peripheral arterial disease: History of left carotid endarterectomy: Stable medical issue.    Obstructive sleep apnea: Uses CPAP   Essential hypertension: Stable.    Spinal stenosis, lumbar region, with neurogenic claudication: Stable.    Chronic diastolic heart failure: Incidentally found during pulmonary embolus workup. BNP only minimally elevated at 729.  Chronic kidney disease stage III: Given his kidney function, and concerned about the high risk for bleeding given his current GFR. We'll plan to start Coumadin.    Code Status: Full code  Family Communication: spoke with son by phone.  Disposition Plan: discharge home once INR therapeutic   Consultants:  None  Procedures:  2-D echo done 73/2: Grade 1 diastolic dysfunction  Antibiotics:  None  Objective: BP 146/48  Pulse 63  Temp(Src) 97.6 F (36.4 C) (Oral)  Resp 18  Ht 5\' 9"  (1.753 m)  Wt 83.915 kg (185 lb)  BMI 27.31 kg/m2  SpO2 99%  Intake/Output Summary (Last 24 hours) at 04/16/14  1349 Last data filed at 04/16/14 0815  Gross per 24 hour  Intake    240 ml  Output    500 ml  Net   -260 ml   Filed Weights   04/13/14 0918  Weight: 83.915 kg (185 lb)    Exam:   General:   Alert and oriented x2, no acute distress  Cardiovascular: Regular rate and rhythm, K0-U5, soft 2/6 systolic ejection murmur  Respiratory: Clear to auscultation bilaterally  Abdomen: Soft, nontender, nondistended, positive bowel sounds  Musculoskeletal: No clubbing or cyanosis, trace edema bilaterally   Data Reviewed: Basic Metabolic Panel:  Recent Labs Lab 04/13/14 1023 04/14/14 0335  NA 137 141  K 4.1 4.3  CL 102 107  CO2 22 22  GLUCOSE 113* 120*  BUN 25* 28*  CREATININE 1.10 1.17  CALCIUM 9.2 8.5   Liver Function Tests: No results found for this basename: AST, ALT, ALKPHOS, BILITOT, PROT, ALBUMIN,  in the last 168 hours No results found for this basename: LIPASE, AMYLASE,  in the last 168 hours No results found for this basename: AMMONIA,  in the last 168 hours CBC:  Recent Labs Lab 04/13/14 1023 04/14/14 0335 04/15/14 0053 04/16/14 0343  WBC 15.3* 14.4* 13.7* 13.3*  NEUTROABS 9.9*  --   --   --   HGB 10.8* 10.0* 10.4* 10.6*  HCT 32.2* 30.3* 30.5* 31.4*  MCV 92.0 91.8 90.5 91.0  PLT 253 239 233 254   Cardiac Enzymes:    Recent Labs Lab 04/13/14 1414 04/13/14 1935 04/14/14 0335  TROPONINI <0.30 <0.30 <0.30   BNP (last 3 results)  Recent Labs  04/15/14 1450  PROBNP 729.0*   CBG: No results found for this basename: GLUCAP,  in the last 168 hours  No results found for this or any previous visit (from the past 240 hour(s)).   Studies: No results found.  Scheduled Meds: . clopidogrel  75 mg Oral Daily  . [START ON 04/17/2014] Influenza vac split quadrivalent PF  0.5 mL Intramuscular Tomorrow-1000  . pantoprazole  40 mg Oral Daily  . polyethylene glycol  17 g Oral QHS  . vitamin B-6  100 mg Oral Daily  . simvastatin  10 mg Oral QHS  . sodium  chloride  3 mL Intravenous Q12H  . warfarin  7.5 mg Oral ONCE-1800  . Warfarin - Pharmacist Dosing Inpatient   Does not apply q1800    Continuous Infusions: . heparin 1,150 Units/hr (04/15/14 1131)     Time spent: 15 minutes  [place] [facility] Pager [phone]. If 7PM-7AM, please contact night-coverage at [email or web address], password [facility] 04/16/2014, 1:49 PM  LOS: 3 days

## 2014-04-16 NOTE — Progress Notes (Signed)
RN says he will place pt on cpap after meds are given.  Rt explained to call if needed.

## 2014-04-17 DIAGNOSIS — I2699 Other pulmonary embolism without acute cor pulmonale: Secondary | ICD-10-CM | POA: Diagnosis not present

## 2014-04-17 LAB — HEPARIN LEVEL (UNFRACTIONATED): Heparin Unfractionated: 0.41 IU/mL (ref 0.30–0.70)

## 2014-04-17 LAB — CBC
HEMATOCRIT: 32.2 % — AB (ref 39.0–52.0)
HEMOGLOBIN: 10.7 g/dL — AB (ref 13.0–17.0)
MCH: 30.3 pg (ref 26.0–34.0)
MCHC: 33.2 g/dL (ref 30.0–36.0)
MCV: 91.2 fL (ref 78.0–100.0)
Platelets: 297 10*3/uL (ref 150–400)
RBC: 3.53 MIL/uL — ABNORMAL LOW (ref 4.22–5.81)
RDW: 14.1 % (ref 11.5–15.5)
WBC: 12.3 10*3/uL — AB (ref 4.0–10.5)

## 2014-04-17 LAB — PROTIME-INR
INR: 1.13 (ref 0.00–1.49)
PROTHROMBIN TIME: 14.5 s (ref 11.6–15.2)

## 2014-04-17 MED ORDER — WARFARIN SODIUM 10 MG PO TABS
10.0000 mg | ORAL_TABLET | Freq: Once | ORAL | Status: AC
  Administered 2014-04-17: 10 mg via ORAL
  Filled 2014-04-17: qty 1

## 2014-04-17 NOTE — Evaluation (Signed)
Occupational Therapy Evaluation Patient Details Name: Larry Huffman MRN: [id number] DOB: July 31, 1923 Today's Date: 04/17/2014    History of Present Illness Larry Huffman is a 80 y.o. male with past medical history of hypertension, PVD and chronic back pain with recent L4-5 decompression and microdiscectomy. Patient adm to the hospital because of acute chest pain. Patient found to have bil PE's.   Clinical Impression   Pt is a pleasant 80 year old man who prior to admission was independent in all aspects of his life and still driving.  Pt is moving well, but is independence in hindered by R hand edema and longstanding arthritic changes.  Issued foam build up for toothbrush and eating utensils and instructed pt in management techniques.  Will follow acutely.    Follow Up Recommendations  No OT follow up    Equipment Recommendations  None recommended by OT    Recommendations for Other Services       Precautions / Restrictions Precautions Precautions: None Precaution Comments: on heparin drip Restrictions Weight Bearing Restrictions: No      Mobility Bed Mobility Overal bed mobility: Modified Independent Bed Mobility: Supine to Sit;Sit to Supine     Supine to sit: Modified independent (Device/Increase time) Sit to supine: Modified independent (Device/Increase time)   General bed mobility comments: from flat bed  Transfers Overall transfer level: Modified independent Equipment used: None   Sit to Stand: Modified independent (Device/Increase time)              Balance     Sitting balance-Leahy Scale: Good       Standing balance-Leahy Scale: Good                              ADL Overall ADL's : Needs assistance/impaired Eating/Feeding: Set up;Sitting   Grooming: Wash/dry hands;Standing;Supervision/safety   Upper Body Bathing: Set up;Sitting   Lower Body Bathing: Set up;Sit to/from stand   Upper Body Dressing : Set up;Sitting   Lower Body  Dressing: Set up;Sit to/from stand   Toilet Transfer: Supervision/safety;Ambulation;Regular Toilet;Grab bars   Toileting- Clothing Manipulation and Hygiene: Moderate assistance;Sit to/from stand Toileting - Clothing Manipulation Details (indicate cue type and reason): used L hand to perform pericare, assist for thoroughness     Functional mobility during ADLs: Supervision/safety (no device, assist for IV pole) General ADL Comments: Issued squeeze ball and 2 foam build ups for toothbrush, eating utensils and/or pen.  Educated pt in edema management techniques and elevated R hand above heart. Instructed pt in fall prevention and safety as he is on blood thinning medication.     Vision                     Perception     Praxis      Pertinent Vitals/Pain Pain Assessment: No/denies pain     Hand Dominance Right   Extremity/Trunk Assessment Upper Extremity Assessment Upper Extremity Assessment: RUE deficits/detail;LUE deficits/detail RUE Deficits / Details: edematous R hand and wrist impeding grasp, arthritic changes RUE Coordination: decreased fine motor LUE Deficits / Details: arthritic changes, longstanding first finger deformity   Lower Extremity Assessment Lower Extremity Assessment: Defer to PT evaluation   Cervical / Trunk Assessment Cervical / Trunk Assessment: Normal   Communication Communication Communication: HOH   Cognition Arousal/Alertness: Awake/alert Behavior During Therapy: WFL for tasks assessed/performed Overall Cognitive Status: Within Functional Limits for tasks assessed  General Comments       Exercises       Shoulder Instructions      Home Living Family/patient expects to be discharged to:: Private residence Living Arrangements: Children (alone 10-12 hours a day) Available Help at Discharge: Family Type of Home: House Home Access: Ramped entrance     Home Layout: Two level;Able to live on main level with  bedroom/bathroom     Bathroom Shower/Tub: Occupational psychologist: Standard     Home Equipment: Clinical cytogeneticist - 2 wheels;Bedside commode          Prior Functioning/Environment Level of Independence: Independent        Comments: Still driving    OT Diagnosis: Generalized weakness   OT Problem List: Increased edema;Decreased coordination;Decreased knowledge of use of DME or AE   OT Treatment/Interventions: Self-care/ADL training;Patient/family education;DME and/or AE instruction    OT Goals(Current goals can be found in the care plan section) Acute Rehab OT Goals Patient Stated Goal: Get stronger and back home OT Goal Formulation: With patient Time For Goal Achievement: 04/24/14 Potential to Achieve Goals: Good ADL Goals Pt Will Perform Eating: with modified independence;sitting (with foam build up) Pt Will Perform Grooming: with modified independence;standing (with foam build up on toothbrush) Pt Will Perform Toileting - Clothing Manipulation and hygiene: with modified independence;sit to/from stand (pericare, with good thoroughness) Additional ADL Goal #1: Pt will be independent in edema reduction techniques for R hand.  OT Frequency: Min 2X/week   Barriers to D/C:            Co-evaluation              End of Session    Activity Tolerance: Patient tolerated treatment well Patient left: in bed;with call bell/phone within reach;with family/visitor present   Time: 1552-0802 OT Time Calculation (min): 42 min Charges:  OT General Charges $OT Visit: 1 Procedure OT Evaluation $Initial OT Evaluation Tier I: 1 Procedure OT Treatments $Self Care/Home Management : 8-22 mins $Therapeutic Activity: 8-22 mins G-Codes:    Malka So 04/17/2014, 2:50 PM [phone]

## 2014-04-17 NOTE — Progress Notes (Signed)
ANTICOAGULATION CONSULT NOTE - Follow Up Consult  Pharmacy Consult for heparin/coumadin  Indication: pulmonary embolus  Allergies  Allergen Reactions  . Ciprofloxacin Rash  . Dapsone Other (See Comments)    Lowers WBC count?? "made me very sick"    Patient Measurements: Height: 5\' 9"  (175.3 cm) Weight: 185 lb (83.915 kg) IBW/kg (Calculated) : 70.7 Heparin Dosing Weight: 84 kg  Vital Signs: Temp: 98.4 F (36.9 C) (10/07 0525) Temp Source: Axillary (10/07 0525) BP: 144/56 mmHg (10/07 0525) Pulse Rate: 58 (10/07 0525)  Labs:  Recent Labs  04/15/14 0053  04/15/14 1939 04/16/14 0343 04/17/14 0322  HGB 10.4*  --   --  10.6* 10.7*  HCT 30.5*  --   --  31.4* 32.2*  PLT 233  --   --  254 297  LABPROT  --   --   --  14.2 14.5  INR  --   --   --  1.10 1.13  HEPARINUNFRC 0.38  < > 0.38 0.40 0.41  < > = values in this interval not displayed.  Estimated Creatinine Clearance: 44.5 ml/min (by C-G formula based on Cr of 1.17).   Assessment: Patient is an 80 y.o M on heparin bridge to coumadin D#3 for acute bilateral PE with right heart strain. Heparin level 0.41 remains at goal this morning, currently on 1150 units/hr. INR 1.13, not moving much after 2 doses. CBC stable, no bleeding documented.  Goal of Therapy:  INR 2-3 Heparin level 0.3-0.7 units/ml Monitor platelets by anticoagulation protocol: Yes   Plan:  - Continue heparin 1150 units/hr - Coumadin 10 mg po x 1 - daily PT/INR, heparin level and cbc  Maryanna Shape, PharmD, BCPS  Clinical Pharmacist  Pager: [phone]   04/17/2014,10:39 AM

## 2014-04-17 NOTE — Progress Notes (Signed)
Physical Therapy Discharge Patient Details Name: Larry Huffman MRN: [id number] DOB: 07-19-1923 Today's Date: 04/17/2014 Time: 0840 (-5 minutes pt in bathroom)-0910 PT Time Calculation (min): 30 min  Patient discharged from PT services secondary to goals met and no further PT needs identified.  Please see latest therapy progress note for current level of functioning and progress toward goals.    Progress and discharge plan discussed with patient and/or caregiver: Patient/Caregiver agrees with plan  GP     [facility] 04/17/2014, 10:07 AM

## 2014-04-17 NOTE — Progress Notes (Signed)
Physical Therapy Treatment Patient Details Name: Larry Huffman MRN: [id number] DOB: 1924-01-10 Today's Date: 20-Apr-2014    History of Present Illness Larry Huffman is a 80 y.o. male with past medical history of hypertension, PVD and chronic back pain with recent L4-5 decompression and microdiscectomy. Patient adm to the hospital because of acute chest pain. Patient found to have bil PE's.    PT Comments    Pt doing well with mobility and no further PT needed.  Ready for dc from PT standpoint.    Follow Up Recommendations  No PT follow up     Equipment Recommendations  None recommended by PT    Recommendations for Other Services       Precautions / Restrictions Precautions Precautions: None    Mobility  Bed Mobility Overal bed mobility: Modified Independent                Transfers Overall transfer level: Modified independent   Transfers: Sit to/from Stand Sit to Stand: Modified independent (Device/Increase time)            Ambulation/Gait Ambulation/Gait assistance: Supervision (only for IV pole) Ambulation Distance (Feet): 400 Feet Assistive device: None Gait Pattern/deviations: WFL(Within Functional Limits)   Gait velocity interpretation: at or above normal speed for age/gender     Stairs            Wheelchair Mobility    Modified Rankin (Stroke Patients Only)       Balance     Sitting balance-Leahy Scale: Good       Standing balance-Leahy Scale: Good                      Cognition Arousal/Alertness: Awake/alert Behavior During Therapy: WFL for tasks assessed/performed Overall Cognitive Status: Within Functional Limits for tasks assessed                      Exercises      General Comments        Pertinent Vitals/Pain Pain Assessment: No/denies pain    Home Living                      Prior Function            PT Goals (current goals can now be found in the care plan section) Progress  towards PT goals: Goals met/education completed, patient discharged from PT    Frequency       PT Plan Discharge plan needs to be updated    Co-evaluation             End of Session   Activity Tolerance: Patient tolerated treatment well Patient left: in bed;with call bell/phone within reach     Time: 0840 (-5 minutes pt in bathroom)-0910 PT Time Calculation (min): 30 min  Charges:  $Gait Training: 23-37 mins                    G Codes:      Enya Bureau 2014-04-20, 10:07 AM  Suanne Marker PT [phone]

## 2014-04-17 NOTE — Progress Notes (Signed)
PROGRESS NOTE  Larry Huffman DXI:[id number] DOB: 10-29-1923 DOA: 04/13/2014 PCP: Florina Ou, MD  HPI/Recap of past 24 hours: Patient is an 80 year old male with past medical history of obstructive sleep apnea, CAD and spinal stenosis who was admitted to the hospitalist service on 10/3 for submassive pulmonary embolus after coming in complaining of chest pain.  Patient started on IV heparin.  Coumadin started on 10/5 evening.  Decided against newer anticoagulants given stage 3 CKD This chest pain has much improved, leaving only residual soreness. Breathing comfortably.  Assessment/Plan: Principal Problem:   Pulmonary embolism:currently on heparin, Coumadin started on 10/5 evening. Need 5 days overlap. -  follow up appointment for INR to be made prior to d/c  Active Problems:   Chest pain: Secondary PE.much improved    CAD - CFX DES 7/07, LAD DES 10/08, cath 11/11- medical Rx: Stable medical issue.    Peripheral arterial disease: History of left carotid endarterectomy: Stable medical issue.    Obstructive sleep apnea: Uses CPAP    Essential hypertension: Stable.    Spinal stenosis, lumbar region, with neurogenic claudication: Stable.    Chronic diastolic heart failure: Incidentally found during pulmonary embolus workup. BNP only minimally elevated at 729.  Chronic kidney disease stage III: Given his kidney function, and concerned about the high risk for bleeding, started Coumadin.    Code Status: Full code  Family Communication: Dr Maryland Pink spoke with son by phone.  Disposition Plan: discharge home once INR therapeutic and 5 days overlap complete - 10/10    Consultants:  None  Procedures:  2-D echo done 76/7: Grade 1 diastolic dysfunction  Antibiotics:  None  Objective: BP 150/53  Pulse 69  Temp(Src) 98.6 F (37 C) (Oral)  Resp 20  Ht 5\' 9"  (1.753 m)  Wt 83.915 kg (185 lb)  BMI 27.31 kg/m2  SpO2 97%  Intake/Output Summary (Last 24 hours) at 04/17/14  1517 Last data filed at 04/17/14 1215  Gross per 24 hour  Intake    480 ml  Output    800 ml  Net   -320 ml   Filed Weights   04/13/14 0918  Weight: 83.915 kg (185 lb)    Exam:   General:   Alert and oriented x2, no acute distress  Cardiovascular: Regular rate and rhythm, H4-L9, soft 2/6 systolic ejection murmur  Respiratory: Clear to auscultation bilaterally  Abdomen: Soft, nontender, nondistended, positive bowel sounds  Musculoskeletal: No clubbing or cyanosis, trace edema bilaterally   Data Reviewed: Basic Metabolic Panel:  Recent Labs Lab 04/13/14 1023 04/14/14 0335  NA 137 141  K 4.1 4.3  CL 102 107  CO2 22 22  GLUCOSE 113* 120*  BUN 25* 28*  CREATININE 1.10 1.17  CALCIUM 9.2 8.5   Liver Function Tests: No results found for this basename: AST, ALT, ALKPHOS, BILITOT, PROT, ALBUMIN,  in the last 168 hours No results found for this basename: LIPASE, AMYLASE,  in the last 168 hours No results found for this basename: AMMONIA,  in the last 168 hours CBC:  Recent Labs Lab 04/13/14 1023 04/14/14 0335 04/15/14 0053 04/16/14 0343 04/17/14 0322  WBC 15.3* 14.4* 13.7* 13.3* 12.3*  NEUTROABS 9.9*  --   --   --   --   HGB 10.8* 10.0* 10.4* 10.6* 10.7*  HCT 32.2* 30.3* 30.5* 31.4* 32.2*  MCV 92.0 91.8 90.5 91.0 91.2  PLT 253 239 233 254 297   Cardiac Enzymes:    Recent Labs Lab 04/13/14 1414 04/13/14  1935 04/14/14 0335  TROPONINI <0.30 <0.30 <0.30   BNP (last 3 results)  Recent Labs  04/15/14 1450  PROBNP 729.0*   CBG: No results found for this basename: GLUCAP,  in the last 168 hours  No results found for this or any previous visit (from the past 240 hour(s)).   Studies: No results found.  Scheduled Meds: . clopidogrel  75 mg Oral Daily  . pantoprazole  40 mg Oral Daily  . polyethylene glycol  17 g Oral QHS  . vitamin B-6  100 mg Oral Daily  . simvastatin  10 mg Oral QHS  . sodium chloride  3 mL Intravenous Q12H  . warfarin  10 mg  Oral ONCE-1800  . Warfarin - Pharmacist Dosing Inpatient   Does not apply q1800    Continuous Infusions: . heparin 1,150 Units/hr (04/17/14 0925)     Time spent: 15 minutes  Jeanny Rymer, MD  [facility] If 7PM-7AM, please contact night-coverage at [email or web address], password [facility] 04/17/2014, 3:17 PM  LOS: 4 days

## 2014-04-18 LAB — CBC
HCT: 29.5 % — ABNORMAL LOW (ref 39.0–52.0)
HEMOGLOBIN: 9.7 g/dL — AB (ref 13.0–17.0)
MCH: 29.8 pg (ref 26.0–34.0)
MCHC: 32.9 g/dL (ref 30.0–36.0)
MCV: 90.8 fL (ref 78.0–100.0)
PLATELETS: 281 10*3/uL (ref 150–400)
RBC: 3.25 MIL/uL — ABNORMAL LOW (ref 4.22–5.81)
RDW: 14 % (ref 11.5–15.5)
WBC: 10.2 10*3/uL (ref 4.0–10.5)

## 2014-04-18 LAB — PROTIME-INR
INR: 1.57 — AB (ref 0.00–1.49)
Prothrombin Time: 18.8 seconds — ABNORMAL HIGH (ref 11.6–15.2)

## 2014-04-18 LAB — HEPARIN LEVEL (UNFRACTIONATED): Heparin Unfractionated: 0.65 IU/mL (ref 0.30–0.70)

## 2014-04-18 MED ORDER — WARFARIN SODIUM 7.5 MG PO TABS
7.5000 mg | ORAL_TABLET | Freq: Once | ORAL | Status: AC
  Administered 2014-04-18: 7.5 mg via ORAL
  Filled 2014-04-18: qty 1

## 2014-04-18 NOTE — Progress Notes (Signed)
PROGRESS NOTE  Larry Huffman:[id number] DOB: 25-Feb-1924 DOA: 04/13/2014 PCP: Florina Ou, MD  HPI/Recap of past 24 hours: Patient is an 80 year old male with past medical history of obstructive sleep apnea, CAD and spinal stenosis who was admitted to the hospitalist service on 10/3 for submassive pulmonary embolus after coming in complaining of chest pain.  Patient started on IV heparin.  Coumadin started on 10/5 evening.  Decided against newer anticoagulants given stage 3 CKD This chest pain has much improved, leaving only residual soreness. Breathing comfortably.  Assessment/Plan: Principal Problem:   Pulmonary embolism:currently on heparin, Coumadin started on 10/5 evening. Need 5 days overlap. -  follow up appointment for INR to be made prior to d/c  Active Problems:   Chest pain: Secondary to  PE.much improved    CAD - CFX DES 7/07, LAD DES 10/08, cath 11/11- medical Rx: Stable medical issue.    Peripheral arterial disease: History of left carotid endarterectomy: Stable medical issue.    Obstructive sleep apnea: Uses CPAP    Essential hypertension: Stable.    Spinal stenosis, lumbar region, with neurogenic claudication: Stable.    Chronic diastolic heart failure: Incidentally found during pulmonary embolus workup. BNP only minimally elevated at 729.  Chronic kidney disease stage III: Given his kidney function, and concerned about the high risk for bleeding, started Coumadin.    Code Status: Full code  Family Communication: Dr [place] Pink spoke with son by phone.  Disposition Plan: discharge home once INR therapeutic and 5 days overlap complete - 10/10    Consultants:  None  Procedures:  2-D echo done 70/4: Grade 1 diastolic dysfunction  Antibiotics:  None  Objective: BP 136/45  Pulse 55  Temp(Src) 98.8 F (37.1 C) (Oral)  Resp 19  Ht 5\' 9"  (1.753 m)  Wt 83.915 kg (185 lb)  BMI 27.31 kg/m2  SpO2 97%  Intake/Output Summary (Last 24 hours) at  04/18/14 1005 Last data filed at 04/18/14 1001  Gross per 24 hour  Intake    480 ml  Output   1800 ml  Net  -1320 ml   Filed Weights   04/13/14 0918  Weight: 83.915 kg (185 lb)    Exam:   General:   Alert and oriented x2, no acute distress  Cardiovascular: Regular rate and rhythm, U8-Q9, soft 2/6 systolic ejection murmur  Respiratory: Clear to auscultation bilaterally  Abdomen: Soft, nontender, nondistended, positive bowel sounds  Musculoskeletal: No clubbing or cyanosis, trace edema bilaterally   Data Reviewed: Basic Metabolic Panel:  Recent Labs Lab 04/13/14 1023 04/14/14 0335  NA 137 141  K 4.1 4.3  CL 102 107  CO2 22 22  GLUCOSE 113* 120*  BUN 25* 28*  CREATININE 1.10 1.17  CALCIUM 9.2 8.5   Liver Function Tests: No results found for this basename: AST, ALT, ALKPHOS, BILITOT, PROT, ALBUMIN,  in the last 168 hours No results found for this basename: LIPASE, AMYLASE,  in the last 168 hours No results found for this basename: AMMONIA,  in the last 168 hours CBC:  Recent Labs Lab 04/13/14 1023 04/14/14 0335 04/15/14 0053 04/16/14 0343 04/17/14 0322 04/18/14 0358  WBC 15.3* 14.4* 13.7* 13.3* 12.3* 10.2  NEUTROABS 9.9*  --   --   --   --   --   HGB 10.8* 10.0* 10.4* 10.6* 10.7* 9.7*  HCT 32.2* 30.3* 30.5* 31.4* 32.2* 29.5*  MCV 92.0 91.8 90.5 91.0 91.2 90.8  PLT 253 239 233 254 297 281   Cardiac  Enzymes:    Recent Labs Lab 04/13/14 1414 04/13/14 1935 04/14/14 0335  TROPONINI <0.30 <0.30 <0.30   BNP (last 3 results)  Recent Labs  04/15/14 1450  PROBNP 729.0*   CBG: No results found for this basename: GLUCAP,  in the last 168 hours  No results found for this or any previous visit (from the past 240 hour(s)).   Studies: No results found.  Scheduled Meds: . clopidogrel  75 mg Oral Daily  . pantoprazole  40 mg Oral Daily  . polyethylene glycol  17 g Oral QHS  . vitamin B-6  100 mg Oral Daily  . simvastatin  10 mg Oral QHS  . sodium  chloride  3 mL Intravenous Q12H  . Warfarin - Pharmacist Dosing Inpatient   Does not apply q1800    Continuous Infusions: . heparin 1,150 Units/hr (04/17/14 0925)     Time spent: 15 minutes  Brolin Dambrosia, MD  [facility] If 7PM-7AM, please contact night-coverage at [email or web address], password [facility] 04/18/2014, 10:05 AM  LOS: 5 days

## 2014-04-18 NOTE — Progress Notes (Signed)
Occupational Therapy Treatment Patient Details Name: DERRIOUS BOLOGNA MRN: [id number] DOB: [birth year] Today's Date: 04/18/2014    History of present illness Abdirahman JSHAUN ABERNATHY is a 80 y.o. male with past medical history of hypertension, PVD and chronic back pain with recent L4-5 decompression and microdiscectomy. Patient adm to the hospital because of acute chest pain. Patient found to have bil PE's.   OT comments  Patient making progress towards OT goals. Continue OT POC.  Follow Up Recommendations  No OT follow up    Equipment Recommendations  None recommended by OT    Recommendations for Other Services      Precautions / Restrictions Precautions Precautions: None Restrictions Weight Bearing Restrictions: No       Mobility Bed Mobility                  Transfers                      Balance                                   ADL   Eating/Feeding: Set up;Sitting   Grooming: Oral care;Set up                                 General ADL Comments: Reviewed use of foam build-ups and pt reports they help with feeding and grooming activities. Reviewed elevation R hand with pillows and towels. Reviewed ball squeeze exercises. Pt's son present. Edema reduced and pt nearly able to make a fist.      Vision                     Perception     Praxis      Cognition   Behavior During Therapy: [facility] for tasks assessed/performed Overall Cognitive Status: Within Functional Limits for tasks assessed                       Extremity/Trunk Assessment               Exercises     Shoulder Instructions       General Comments      Pertinent Vitals/ Pain       Pain Assessment: No/denies pain  Home Living                                          Prior Functioning/Environment              Frequency Min 2X/week     Progress Toward Goals  OT Goals(current goals can now be found in the care  plan section)  Progress towards OT goals: Progressing toward goals     Plan      Co-evaluation                 End of Session     Activity Tolerance Patient tolerated treatment well   Patient Left in bed;with call bell/phone within reach;with family/visitor present   Nurse Communication          Time: 7106-2694 OT Time Calculation (min): 12 min  Charges: OT General Charges $OT Visit: 1 Procedure OT Treatments $Self Care/Home Management : 8-22 mins  Azul Coffie,  Vy Badley A 04/18/2014, 12:15 PM

## 2014-04-18 NOTE — Progress Notes (Addendum)
Pt not ready for CPAP at this time, visiting with family. RT will check back later to place on CPAP  Placed pt on CPAP at [place] at 2210 and pt is now resting comfortably

## 2014-04-18 NOTE — Progress Notes (Signed)
ANTICOAGULATION CONSULT NOTE - Follow Up Consult  Pharmacy Consult for heparin/coumadin  Indication: pulmonary embolus  Allergies  Allergen Reactions  . Ciprofloxacin Rash  . Dapsone Other (See Comments)    Lowers WBC count?? "made me very sick"    Patient Measurements: Height: 5\' 9"  (175.3 cm) Weight: 185 lb (83.915 kg) IBW/kg (Calculated) : 70.7 Heparin Dosing Weight: 84 kg  Vital Signs: Temp: 98.8 F (37.1 C) (10/08 0358) Temp Source: Oral (10/08 0358) BP: 136/45 mmHg (10/08 0358) Pulse Rate: 55 (10/08 0358)  Labs:  Recent Labs  04/16/14 0343 04/17/14 0322 04/18/14 0358  HGB 10.6* 10.7* 9.7*  HCT 31.4* 32.2* 29.5*  PLT 254 297 281  LABPROT 14.2 14.5 18.8*  INR 1.10 1.13 1.57*  HEPARINUNFRC 0.40 0.41 0.65    Estimated Creatinine Clearance: 44.5 ml/min (by C-G formula based on Cr of 1.17).   Assessment: Patient is an 79 y.o M on heparin bridge to coumadin D#4 for acute bilateral PE with right heart strain. Heparin level 0.65 remains at goal this morning, currently on 1150 units/hr. INR 1.13 > 1.57, CBC stable, no bleeding documented.  Goal of Therapy:  INR 2-3 Heparin level 0.3-0.7 units/ml Monitor platelets by anticoagulation protocol: Yes   Plan:  - Continue heparin 1150 units/hr - Coumadin 7.5 mg po x 1 - daily PT/INR, heparin level and cbc  Maryanna Shape, PharmD, BCPS  Clinical Pharmacist  Pager: [phone]   04/18/2014,10:54 AM

## 2014-04-19 DIAGNOSIS — F0390 Unspecified dementia without behavioral disturbance: Secondary | ICD-10-CM | POA: Diagnosis present

## 2014-04-19 LAB — HEPARIN LEVEL (UNFRACTIONATED)
HEPARIN UNFRACTIONATED: 0.92 [IU]/mL — AB (ref 0.30–0.70)
HEPARIN UNFRACTIONATED: 0.78 [IU]/mL — AB (ref 0.30–0.70)

## 2014-04-19 LAB — CBC
HCT: 29.3 % — ABNORMAL LOW (ref 39.0–52.0)
HEMOGLOBIN: 9.5 g/dL — AB (ref 13.0–17.0)
MCH: 29.6 pg (ref 26.0–34.0)
MCHC: 32.4 g/dL (ref 30.0–36.0)
MCV: 91.3 fL (ref 78.0–100.0)
PLATELETS: 280 10*3/uL (ref 150–400)
RBC: 3.21 MIL/uL — AB (ref 4.22–5.81)
RDW: 14.1 % (ref 11.5–15.5)
WBC: 9.9 10*3/uL (ref 4.0–10.5)

## 2014-04-19 LAB — PROTIME-INR
INR: 2.16 — AB (ref 0.00–1.49)
Prothrombin Time: 24.1 seconds — ABNORMAL HIGH (ref 11.6–15.2)

## 2014-04-19 MED ORDER — WARFARIN SODIUM 5 MG PO TABS: 5.0000 mg | ORAL_TABLET | Freq: Once | ORAL | Status: DC

## 2014-04-19 MED ORDER — WARFARIN SODIUM 5 MG PO TABS: 5.0000 mg | ORAL_TABLET | Freq: Every day | ORAL | Status: DC

## 2014-04-19 MED ORDER — WARFARIN SODIUM 7.5 MG PO TABS
7.5000 mg | ORAL_TABLET | Freq: Once | ORAL | Status: AC
  Administered 2014-04-19: 7.5 mg via ORAL
  Filled 2014-04-19: qty 1

## 2014-04-19 NOTE — Discharge Planning (Addendum)
Physician Discharge Summary  Larry Huffman [id number]:[id number] DOB: 1923/10/11 DOA: 04/13/2014  PCP: Florina Ou, MD  Admit date: 04/13/2014 Discharge date: 04/19/2014  Time spent: >45 minutes  Recommendations for Outpatient Follow-up:  1. INR IN 3 days 2. - oupt coagulation testing once off anticoagulation  Discharge Condition: stable Diet recommendation: heart heatlhy  Discharge Diagnoses:  Principal Problem:   Pulmonary embolism Active Problems:   Chest pain   CAD - CFX DES 7/07, LAD DES 10/08, cath 11/11- medical Rx   Peripheral arterial disease: History of left carotid endarterectomy   Obstructive sleep apnea: Uses CPAP   Essential hypertension   Spinal stenosis, lumbar region, with neurogenic claudication   Chronic diastolic heart failure   Chronic kidney disease (CKD), stage III (moderate)   Dementia   History of present illness:  Patient is an 80 year old male with past medical history of obstructive sleep apnea, CAD and spinal stenosis who was admitted to the hospitalist service on 10/3 for submassive pulmonary embolus after coming in complaining of chest pain. Patient started on IV heparin.  Coumadin started on 10/5 evening. Decided against newer anticoagulants given stage 3 CKD  This chest pain has much improved, leaving only residual soreness. Breathing comfortably.   Hospital Course:  Principal Problem:  Pulmonary embolism Heparin and Coumadin started on 10/5 evening.  - has had 5 days overlap -  follow up appointment for INR to be made prior to d/c- - his granddaughter has also recently been diagnosed with a blood clot- recommend checking for hereditary coagulopathies.   Active Problems:  Chest pain: Secondary to PE- much improved   CAD - CFX DES 7/07, LAD DES 10/08, cath 11/11- medical Rx - Will need to cont ASA and Plavix which he was on as outpt  Peripheral arterial disease -  History of left carotid endarterectomy-  Cont ASA and Plavix  Dementia - pt  himself states he has trouble with short term memory  Obstructive sleep apnea: Uses CPAP   Essential hypertension: Stable.   Spinal stenosis, lumbar region, with neurogenic claudication: Stable.   Chronic diastolic heart failure: Incidentally found during pulmonary embolus workup. Compensated   Chronic kidney disease stage III: Given his kidney function, and concerned about the high risk for bleeding, started Coumadin.    Consultations:  none  Discharge Exam: Filed Weights   04/13/14 0918  Weight: 83.915 kg (185 lb)   Filed Vitals:   04/19/14 1356  BP: 147/46  Pulse: 61  Temp: 97.7 F (36.5 C)  Resp: 18    General: AAO x 3, no distress- forgetful about day to day events Cardiovascular: RRR, no murmurs  Respiratory: clear to auscultation bilaterally GI: soft, non-tender, non-distended, bowel sound positive  Discharge Instructions You were cared for by a hospitalist during your hospital stay. If you have any questions about your discharge medications or the care you received while you were in the hospital after you are discharged, you can call the unit and asked to speak with the hospitalist on call if the hospitalist that took care of you is not available. Once you are discharged, your primary care physician will handle any further medical issues. Please note that NO REFILLS for any discharge medications will be authorized once you are discharged, as it is imperative that you return to your primary care physician (or establish a relationship with a primary care physician if you do not have one) for your aftercare needs so that they can reassess your need for medications and monitor  your lab values.     Medication List         amLODipine 10 MG tablet  Commonly known as:  NORVASC  Take 10 mg by mouth daily.     aspirin EC 81 MG tablet  Take 81 mg by mouth daily.     CALCIUM + D PO  Take 1 tablet by mouth daily.     clopidogrel 75 MG tablet  Commonly known as:   PLAVIX  Take 1 tablet (75 mg total) by mouth daily.     diphenhydramine-acetaminophen 25-500 MG Tabs  Commonly known as:  TYLENOL PM  Take 1 tablet by mouth at bedtime as needed. For pain     ferrous sulfate 325 (65 FE) MG tablet  Take 325 mg by mouth daily with breakfast.     Fish Oil 1200 MG Caps  Take 1,200 mg by mouth daily.     fluticasone 50 MCG/ACT nasal spray  Commonly known as:  FLONASE  Place 1 spray into both nostrils daily as needed for allergies.     montelukast 10 MG tablet  Commonly known as:  SINGULAIR  Take 10 mg by mouth at bedtime.     multivitamin with minerals Tabs tablet  Take 1 tablet by mouth every morning.     nitroGLYCERIN 0.4 MG SL tablet  Commonly known as:  NITROSTAT  Place 1 tablet (0.4 mg total) under the tongue every 5 (five) minutes as needed for chest pain.     pantoprazole 40 MG tablet  Commonly known as:  PROTONIX  Take 1 tablet (40 mg total) by mouth daily.     polyethylene glycol packet  Commonly known as:  MIRALAX / GLYCOLAX  Take 17 g by mouth at bedtime.     simvastatin 10 MG tablet  Commonly known as:  ZOCOR  Take 1 tablet (10 mg total) by mouth at bedtime.     tolterodine 4 MG 24 hr capsule  Commonly known as:  DETROL LA  Take 4 mg by mouth daily.     VITAMIN B6 PO  Take 100 mg by mouth daily.     vitamin C 1000 MG tablet  Take 1,000 mg by mouth daily.     warfarin 5 MG tablet  Commonly known as:  COUMADIN  Take 1 tablet (5 mg total) by mouth one time only at 6 PM.       Allergies  Allergen Reactions  . Ciprofloxacin Rash  . Dapsone Other (See Comments)    Lowers WBC count?? "made me very sick"      The results of significant diagnostics from this hospitalization (including imaging, microbiology, ancillary and laboratory) are listed below for reference.    Significant Diagnostic Studies: Dg Chest 2 View  04/13/2014   CLINICAL DATA:  Left-sided chest pain since yesterday semi: History of coronary artery  disease with stent placement, hypertension, and previous tobacco use.  EXAM: CHEST  2 VIEW  COMPARISON:  PA and lateral chest of February 04, 2014  FINDINGS: The right lung is well-expanded and clear. On the left there is mild volume loss with apparent elevation of the hemidiaphragm. There is haziness of the left lateral and posterior costophrenic angles. There is no alveolar infiltrate. The cardiac silhouette is mildly enlarged though stable. There is tortuosity of the descending thoracic aorta. The pulmonary vascularity is not engorged. There are degenerative changes of the shoulders.  IMPRESSION: There is mild volume loss on the left with apparent elevation of the hemidiaphragm  and subsegmental atelectasis in the costophrenic gutters. There is no evidence of pneumonia, pulmonary edema, nor of a significant pleural effusion.   Electronically Signed   By: David  Martinique   On: 04/13/2014 11:05   Ct Angio Chest Pe W/cm &/or Wo Cm  04/13/2014   CLINICAL DATA:  r/o pe, left chest pain, elevated D-dimer, elevated white blood cell count  EXAM: CT ANGIOGRAPHY CHEST WITH CONTRAST  TECHNIQUE: Multidetector CT imaging of the chest was performed using the standard protocol during bolus administration of intravenous contrast. Multiplanar CT image reconstructions and MIPs were obtained to evaluate the vascular anatomy.  CONTRAST:  71mL OMNIPAQUE IOHEXOL 350 MG/ML SOLN  COMPARISON:  05/29/2010  FINDINGS: Mild right ventricular dilatation, RV/LV ratio 1.2. Segmental partially occlusive pulmonary emboli in right upper and middle lobe branches as well as in the central right lower lobe branch of the pulmonary artery. On the left, there is a single segmental embolus in the anterior basal segment lower lobe branch, partially occlusive. Patent superior and inferior pulmonary veins bilaterally. Mild left atrial enlargement. Patchy coronary and aortic calcifications. Classic 3 vessel brachiocephalic arterial origin anatomy from of the  aortic arch without proximal stenosis. No aortic dissection, aneurysm, or stenosis.  Small pleural effusions left greater than right. Sub cm prevascular, AP window, and right paratracheal lymph nodes. No hilar adenopathy. Dependent atelectasis posteriorly in both lower lobes left greater than right. No confluent airspace consolidation or overt interstitial edema. Old lower thoracic compression deformity. Spurring across multiple contiguous levels in the mid and lower thoracic spine. Subcentimeter partially calcified stone in the dependent aspect of the gallbladder. Remainder of visualized upper abdomen unremarkable.  Review of the MIP images confirms the above findings.  IMPRESSION: 1. Bilateral central and segmental partially occlusive pulmonary emboli with CT evidence of right heartstrain (RV/LV Ratio = 1.2) consistent with at least submassive (intermediate risk)PE. The presence of right heart strain has been associated with anincreased risk of morbidity and mortality. Consultation with [facility] is recommended. Critical Value/emergent results were called by telephone at the time of interpretation on 04/13/2014 at 1:07 pm to Dr. Blanchie Dessert , who verbally acknowledged these results. 2. Atherosclerosis, including aortic and coronary artery disease. Please note that although the presence of coronary artery calcium documents the presence of coronary artery disease, the severity of this disease and any potential stenosis cannot be assessed on this non-gated CT examination. Assessment for potential risk factor modification, dietary therapy or pharmacologic therapy may be warranted, if clinically indicated. 3. Cholelithiasis   Electronically Signed   By: Arne Cleveland M.D.   On: 04/13/2014 13:09    Microbiology: No results found for this or any previous visit (from the past 240 hour(s)).   Labs: Basic Metabolic Panel:  Recent Labs Lab 04/13/14 1023 04/14/14 0335  NA 137 141   K 4.1 4.3  CL 102 107  CO2 22 22  GLUCOSE 113* 120*  BUN 25* 28*  CREATININE 1.10 1.17  CALCIUM 9.2 8.5   Liver Function Tests: No results found for this basename: AST, ALT, ALKPHOS, BILITOT, PROT, ALBUMIN,  in the last 168 hours No results found for this basename: LIPASE, AMYLASE,  in the last 168 hours No results found for this basename: AMMONIA,  in the last 168 hours CBC:  Recent Labs Lab 04/13/14 1023  04/15/14 0053 04/16/14 0343 04/17/14 0322 04/18/14 0358 04/19/14 0404  WBC 15.3*  < > 13.7* 13.3* 12.3* 10.2 9.9  NEUTROABS 9.9*  --   --   --   --   --   --  HGB 10.8*  < > 10.4* 10.6* 10.7* 9.7* 9.5*  HCT 32.2*  < > 30.5* 31.4* 32.2* 29.5* 29.3*  MCV 92.0  < > 90.5 91.0 91.2 90.8 91.3  PLT 253  < > 233 254 297 281 280  < > = values in this interval not displayed. Cardiac Enzymes:  Recent Labs Lab 04/13/14 1414 04/13/14 1935 04/14/14 0335  TROPONINI <0.30 <0.30 <0.30   BNP: BNP (last 3 results)  Recent Labs  04/15/14 1450  PROBNP 729.0*   CBG: No results found for this basename: GLUCAP,  in the last 168 hours     Signed:  Debbe Odea, MD [facility] 04/19/2014, 2:30 PM

## 2014-04-19 NOTE — Progress Notes (Signed)
PROGRESS NOTE  Larry Huffman GQQ:[id number] DOB: Feb 05, 1924 DOA: 04/13/2014 PCP: Florina Ou, MD  HPI/Recap of past 24 hours: Patient is an 80 year old male with past medical history of obstructive sleep apnea, CAD and spinal stenosis who was admitted to the hospitalist service on 10/3 for submassive pulmonary embolus after coming in complaining of chest pain.  Patient started on IV heparin.  Coumadin started on 10/5 evening.  Decided against newer anticoagulants given stage 3 CKD This chest pain has much improved, leaving only residual soreness. Breathing comfortably.  Assessment/Plan: Principal Problem:   Pulmonary embolism:currently on heparin, Coumadin started on 10/5 evening. Need 5 days overlap- tomorrow is last day -  follow up appointment for INR to be made prior to d/c- have placed an order for this in Epic - spoke with secretary   Active Problems:   Chest pain: Secondary to  PE- much improved    CAD - CFX DES 7/07, LAD DES 10/08, cath 11/11- medical Rx: Stable medical issue.    Peripheral arterial disease: History of left carotid endarterectomy: Stable medical issue.    Obstructive sleep apnea: Uses CPAP    Essential hypertension: Stable.    Spinal stenosis, lumbar region, with neurogenic claudication: Stable.    Chronic diastolic heart failure: Incidentally found during pulmonary embolus workup. Compensated   Chronic kidney disease stage III: Given his kidney function, and concerned about the high risk for bleeding, started Coumadin.    Code Status: Full code  Family Communication: Dr [place] Pink spoke with son by phone. I spoke with him on 10/9  Disposition Plan: discharge home once INR therapeutic and 5 days overlap complete - 10/10    Consultants:  None  Procedures:  2-D echo done 67/1: Grade 1 diastolic dysfunction  Antibiotics:  None  Objective: BP 147/46  Pulse 61  Temp(Src) 97.7 F (36.5 C) (Oral)  Resp 18  Ht 5\' 9"  (1.753 m)  Wt 83.915 kg  (185 lb)  BMI 27.31 kg/m2  SpO2 97%  Intake/Output Summary (Last 24 hours) at 04/19/14 1421 Last data filed at 04/19/14 0934  Gross per 24 hour  Intake     64 ml  Output   1000 ml  Net   -936 ml   Filed Weights   04/13/14 0918  Weight: 83.915 kg (185 lb)    Exam:   General:   Alert and oriented x2, no acute distress  Cardiovascular: Regular rate and rhythm, I4-P8, soft 2/6 systolic ejection murmur  Respiratory: Clear to auscultation bilaterally  Abdomen: Soft, nontender, nondistended, positive bowel sounds  Musculoskeletal: No clubbing or cyanosis, trace edema bilaterally   Data Reviewed: Basic Metabolic Panel:  Recent Labs Lab 04/13/14 1023 04/14/14 0335  NA 137 141  K 4.1 4.3  CL 102 107  CO2 22 22  GLUCOSE 113* 120*  BUN 25* 28*  CREATININE 1.10 1.17  CALCIUM 9.2 8.5   Liver Function Tests: No results found for this basename: AST, ALT, ALKPHOS, BILITOT, PROT, ALBUMIN,  in the last 168 hours No results found for this basename: LIPASE, AMYLASE,  in the last 168 hours No results found for this basename: AMMONIA,  in the last 168 hours CBC:  Recent Labs Lab 04/13/14 1023  04/15/14 0053 04/16/14 0343 04/17/14 0322 04/18/14 0358 04/19/14 0404  WBC 15.3*  < > 13.7* 13.3* 12.3* 10.2 9.9  NEUTROABS 9.9*  --   --   --   --   --   --   HGB 10.8*  < >  10.4* 10.6* 10.7* 9.7* 9.5*  HCT 32.2*  < > 30.5* 31.4* 32.2* 29.5* 29.3*  MCV 92.0  < > 90.5 91.0 91.2 90.8 91.3  PLT 253  < > 233 254 297 281 280  < > = values in this interval not displayed. Cardiac Enzymes:    Recent Labs Lab 04/13/14 1414 04/13/14 1935 04/14/14 0335  TROPONINI <0.30 <0.30 <0.30   BNP (last 3 results)  Recent Labs  04/15/14 1450  PROBNP 729.0*   CBG: No results found for this basename: GLUCAP,  in the last 168 hours  No results found for this or any previous visit (from the past 240 hour(s)).   Studies: No results found.  Scheduled Meds: . clopidogrel  75 mg Oral  Daily  . pantoprazole  40 mg Oral Daily  . polyethylene glycol  17 g Oral QHS  . vitamin B-6  100 mg Oral Daily  . simvastatin  10 mg Oral QHS  . sodium chloride  3 mL Intravenous Q12H  . warfarin  7.5 mg Oral ONCE-1800  . Warfarin - Pharmacist Dosing Inpatient   Does not apply q1800    Continuous Infusions: . heparin 1,000 Units/hr (04/19/14 0630)     Time spent: 20 minutes  [name], MD  [facility] If 7PM-7AM, please contact night-coverage at [email or web address], password [facility] 04/19/2014, 2:21 PM  LOS: 6 days

## 2014-04-19 NOTE — Progress Notes (Signed)
Occupational Therapy Treatment and Discharge Patient Details Name: Larry Huffman MRN: [id number] DOB: [birth year] Today's Date: 04/19/2014    History of present illness Larry Huffman is a 80 y.o. male with past medical history of hypertension, PVD and chronic back pain with recent L4-5 decompression and microdiscectomy. Patient adm to the hospital because of acute chest pain. Patient found to have bil PE's.   OT comments  This 80 yo male presents to acute OT today with per his report and son's RUE almost back to normal size wise and pt able to self feed without red tubing today. Still of note was diminished strength over LUE and his RUE is his dominant hand. Focus of today's session mainly on exercises and activities for RUE. No further OT needs we will sign off.  Follow Up Recommendations  No OT follow up    Equipment Recommendations  None recommended by OT       Precautions / Restrictions Precautions Precautions: None Restrictions Weight Bearing Restrictions: No       Mobility Bed Mobility Overal bed mobility: Modified Independent Bed Mobility: Supine to Sit     Supine to sit: Modified independent (Device/Increase time);HOB elevated (and use of rail)        Transfers Overall transfer level: Needs assistance Equipment used: None Transfers: Sit to/from Stand Sit to Stand: Supervision                  ADL Overall ADL's : Needs assistance/impaired Eating/Feeding:  (Pt and son report that pt fed himself today using RUE and without use of red tubing)   Grooming: Wash/dry hands;Supervision/safety;Standing                       Toileting- Clothing Manipulation and Hygiene: Minimal assistance (for donning depends, with S sit<>stand)                          Cognition   Behavior During Therapy: WFL for tasks assessed/performed Overall Cognitive Status: Within Functional Limits for tasks assessed                         Exercises Other  Exercises Other Exercises: Issued pt red putty and 2 handouts (one for theraputty exercises that work on flexion and extension of digits) and FM exercise/activity sheets. Pt did theraputty exercises with me as some of the other ones for the second handout           Pertinent Vitals/ Pain       Pain Assessment: 0-10 Pain Location: in right hand with putty exercises Pain Descriptors / Indicators: Sore Pain Intervention(s): Monitored during session         Frequency Min 2X/week     Progress Toward Goals  OT Goals(current goals can now be found in the care plan section)  Progress towards OT goals:  (All education completed)     Plan Discharge plan remains appropriate       End of Session Equipment Utilized During Treatment:  (None)   Activity Tolerance Patient tolerated treatment well   Patient Left with family/visitor present (sitting EOB)           Time: 9326-7124 OT Time Calculation (min): 52 min  Charges: OT General Charges $OT Visit: 1 Procedure OT Treatments $Self Care/Home Management : 8-22 mins $Therapeutic Exercise: 23-37 mins  Almon Register [phone] 04/19/2014, 3:17 PM

## 2014-04-19 NOTE — Progress Notes (Signed)
ANTICOAGULATION CONSULT NOTE - Follow Up Consult  Pharmacy Consult for heparin Indication: pulmonary embolus  Labs:  Recent Labs  04/17/14 0322 04/18/14 0358 04/19/14 0404  HGB 10.7* 9.7* 9.5*  HCT 32.2* 29.5* 29.3*  PLT 297 281 280  LABPROT 14.5 18.8* 24.1*  INR 1.13 1.57* 2.16*  HEPARINUNFRC 0.41 0.65 0.92*    Assessment: 80yo male now supratherapeutic on heparin after several levels at goal, had been trending up, apparently accumulating.  Goal of Therapy:  Heparin level 0.3-0.7 units/ml   Plan:  Will decrease heparin gtt by 2 units/kg/hr to 1000 units/hr and check level in [place], PharmD, BCPS  04/19/2014,6:21 AM

## 2014-04-19 NOTE — Progress Notes (Signed)
Noted dressing to R AC soaked with blood. Dressing change done no active bleeding noted at this time. Pt denied any c/o of pain or SOB. CPAP on the patient resting comfortable. Heparin drip dose changed to 10 ml/hr per Pharmacy. No distress  observed.Marland Kitchen

## 2014-04-19 NOTE — Progress Notes (Signed)
UR complete.  Lindbergh Winkles RN, MSN 

## 2014-04-20 LAB — PROTIME-INR
INR: 2.8 — AB (ref 0.00–1.49)
PROTHROMBIN TIME: 29.5 s — AB (ref 11.6–15.2)

## 2014-04-20 LAB — CBC
HEMATOCRIT: 29.3 % — AB (ref 39.0–52.0)
HEMOGLOBIN: 9.8 g/dL — AB (ref 13.0–17.0)
MCH: 30.3 pg (ref 26.0–34.0)
MCHC: 33.4 g/dL (ref 30.0–36.0)
MCV: 90.7 fL (ref 78.0–100.0)
Platelets: 315 10*3/uL (ref 150–400)
RBC: 3.23 MIL/uL — ABNORMAL LOW (ref 4.22–5.81)
RDW: 14.1 % (ref 11.5–15.5)
WBC: 9.7 10*3/uL (ref 4.0–10.5)

## 2014-04-20 LAB — HEPARIN LEVEL (UNFRACTIONATED): HEPARIN UNFRACTIONATED: 0.77 [IU]/mL — AB (ref 0.30–0.70)

## 2014-04-20 MED ORDER — HYDROCODONE-ACETAMINOPHEN 5-325 MG PO TABS: 1.0000 | ORAL_TABLET | Freq: Four times a day (QID) | ORAL | Status: DC | PRN

## 2014-04-20 MED ORDER — WARFARIN SODIUM 5 MG PO TABS: 5.0000 mg | ORAL_TABLET | Freq: Every day | ORAL | Status: DC

## 2014-04-20 NOTE — Progress Notes (Signed)
ANTICOAGULATION CONSULT NOTE - Follow Up Consult  Pharmacy Consult for heparin Indication: pulmonary embolus  Labs:  Recent Labs  04/18/14 0358 04/19/14 0404 04/19/14 1430 04/20/14 0248  HGB 9.7* 9.5*  --  9.8*  HCT 29.5* 29.3*  --  29.3*  PLT 281 280  --  315  LABPROT 18.8* 24.1*  --  29.5*  INR 1.57* 2.16*  --  2.80*  HEPARINUNFRC 0.65 0.92* 0.78* 0.77*    Assessment: 80yo male remains supratherapeutic on heparin after rate decrease.  Goal of Therapy:  Heparin level 0.3-0.7 units/ml   Plan:  Will decrease heparin gtt by 1 units/kg/hr to 900 units/hr and check level in [place], PharmD, BCPS  04/20/2014,4:04 AM

## 2014-04-20 NOTE — Discharge Summary (Signed)
Physician Discharge Summary   Larry Huffman:[id number] DOB: 04-10-1924 DOA: 04/13/2014  PCP: Florina Ou, MD  Admit date: 04/13/2014  Discharge date: 04/20/2014  Time spent: >45 minutes   Recommendations for Outpatient Follow-up:  Outpatient INR check on 10/12 Subsequently check INR q. 72 hours, x2, then weekly then monthly after 6 months Hypercoagulable workup off Coumadin  Discharge Condition: stable  Diet recommendation: heart heatlhy     Discharge Diagnoses:  Principal Problem:  Pulmonary embolism  Active Problems:  Chest pain  CAD - CFX DES 7/07, LAD DES 10/08, cath 11/11- medical Rx  Peripheral arterial disease: History of left carotid endarterectomy  Obstructive sleep apnea: Uses CPAP  Essential hypertension  Spinal stenosis, lumbar region, with neurogenic claudication  Chronic diastolic heart failure  Chronic kidney disease (CKD), stage III (moderate)  Dementia   History of present illness:  Patient is an 80 year old male with past medical history of obstructive sleep apnea, CAD and spinal stenosis who was admitted to the hospitalist service on 10/3 for submassive pulmonary embolus after coming in complaining of chest pain. Patient started on IV heparin.  Coumadin started on 10/5 evening. Decided against newer anticoagulants given stage 3 CKD  This chest pain has much improved, leaving only residual soreness. Breathing comfortably.   Hospital Course:  Principal Problem:  Pulmonary embolism in the setting of his recent back surgery, long distance travel 2 weeks ago  Heparin and Coumadin started on 10/5 evening. - has had 5 days overlap , INR therapeutic for 48 hours - follow up appointment for INR to be made with PCP on 10/12 - his granddaughter has also recently been diagnosed with a blood clot- recommend checking for hereditary coagulopathies.    Chest pain: Secondary to PE- much improved next   CAD - history of circumflex DES 7/07, left anterior descending  DES 10/08, cath 11/11- medical Rx  - Will need to cont ASA and Plavix which he was on as outpt   Peripheral arterial disease  - History of left carotid endarterectomy- Cont ASA and Plavix    Dementia  - pt himself states he has trouble with short term memory , no Delerium  Obstructive sleep apnea: Uses CPAP    Essential hypertension: Stable.   Spinal stenosis, lumbar region, with neurogenic claudication: Stable.   Chronic diastolic heart failure: Incidentally found during pulmonary embolus workup. Compensated   Chronic kidney disease stage III: Given his kidney function, and concerned about the high risk for bleeding, started Coumadin.   Consultations:  none Discharge Exam:  Filed Weights    04/13/14 0918   Weight:  83.915 kg (185 lb)    Filed Vitals:    04/19/14 1356   BP:  147/46   Pulse:  61   Temp:  97.7 F (36.5 C)   Resp:  18    General: AAO x 3, no distress- forgetful about day to day events  Cardiovascular: RRR, no murmurs  Respiratory: clear to auscultation bilaterally  GI: soft, non-tender, non-distended, bowel sound positive  Discharge Instructions  You were cared for by a hospitalist during your hospital stay. If you have any questions about your discharge medications or the care you received while you were in the hospital after you are discharged, you can call the unit and asked to speak with the hospitalist on call if the hospitalist that took care of you is not available. Once you are discharged, your primary care physician will handle any further medical issues. Please note that NO  REFILLS for any discharge medications will be authorized once you are discharged, as it is imperative that you return to your primary care physician (or establish a relationship with a primary care physician if you do not have one) for your aftercare needs so that they can reassess your need for medications and monitor your lab values.     Medication List         amLODipine 10  MG tablet    Commonly known as: NORVASC    Take 10 mg by mouth daily.    aspirin EC 81 MG tablet    Take 81 mg by mouth daily.    CALCIUM + D PO    Take 1 tablet by mouth daily.    clopidogrel 75 MG tablet    Commonly known as: PLAVIX    Take 1 tablet (75 mg total) by mouth daily.    diphenhydramine-acetaminophen 25-500 MG Tabs    Commonly known as: TYLENOL PM    Take 1 tablet by mouth at bedtime as needed. For pain    ferrous sulfate 325 (65 FE) MG tablet    Take 325 mg by mouth daily with breakfast.    Fish Oil 1200 MG Caps    Take 1,200 mg by mouth daily.    fluticasone 50 MCG/ACT nasal spray    Commonly known as: FLONASE    Place 1 spray into both nostrils daily as needed for allergies.    montelukast 10 MG tablet    Commonly known as: SINGULAIR    Take 10 mg by mouth at bedtime.    multivitamin with minerals Tabs tablet    Take 1 tablet by mouth every morning.    nitroGLYCERIN 0.4 MG SL tablet    Commonly known as: NITROSTAT    Place 1 tablet (0.4 mg total) under the tongue every 5 (five) minutes as needed for chest pain.    pantoprazole 40 MG tablet    Commonly known as: PROTONIX    Take 1 tablet (40 mg total) by mouth daily.    polyethylene glycol packet    Commonly known as: MIRALAX / GLYCOLAX    Take 17 g by mouth at bedtime.    simvastatin 10 MG tablet    Commonly known as: ZOCOR    Take 1 tablet (10 mg total) by mouth at bedtime.    tolterodine 4 MG 24 hr capsule    Commonly known as: DETROL LA    Take 4 mg by mouth daily.    VITAMIN B6 PO    Take 100 mg by mouth daily.    vitamin C 1000 MG tablet    Take 1,000 mg by mouth daily.    warfarin 5 MG tablet    Commonly known as: COUMADIN    Take 1 tablet (5 mg total) by mouth one time only at 6 PM.      Allergies   Allergen  Reactions   .  Ciprofloxacin  Rash   .  Dapsone  Other (See Comments)     Lowers WBC count?? "made me very sick"      The results of significant diagnostics from this  hospitalization (including imaging, microbiology, ancillary and laboratory) are listed below for reference.   Significant Diagnostic Studies:  Dg Chest 2 View  04/13/2014 CLINICAL DATA: Left-sided chest pain since yesterday semi: History of coronary artery disease with stent placement, hypertension, and previous tobacco use. EXAM: CHEST 2 VIEW COMPARISON: PA and lateral chest of February 04, 2014 FINDINGS: The right lung is well-expanded and clear. On the left there is mild volume loss with apparent elevation of the hemidiaphragm. There is haziness of the left lateral and posterior costophrenic angles. There is no alveolar infiltrate. The cardiac silhouette is mildly enlarged though stable. There is tortuosity of the descending thoracic aorta. The pulmonary vascularity is not engorged. There are degenerative changes of the shoulders. IMPRESSION: There is mild volume loss on the left with apparent elevation of the hemidiaphragm and subsegmental atelectasis in the costophrenic gutters. There is no evidence of pneumonia, pulmonary edema, nor of a significant pleural effusion. Electronically Signed By: David Martinique On: 04/13/2014 11:05  Ct Angio Chest Pe W/cm &/or Wo Cm  04/13/2014 CLINICAL DATA: r/o pe, left chest pain, elevated D-dimer, elevated white blood cell count EXAM: CT ANGIOGRAPHY CHEST WITH CONTRAST TECHNIQUE: Multidetector CT imaging of the chest was performed using the standard protocol during bolus administration of intravenous contrast. Multiplanar CT image reconstructions and MIPs were obtained to evaluate the vascular anatomy. CONTRAST: 46mL OMNIPAQUE IOHEXOL 350 MG/ML SOLN COMPARISON: 05/29/2010 FINDINGS: Mild right ventricular dilatation, RV/LV ratio 1.2. Segmental partially occlusive pulmonary emboli in right upper and middle lobe branches as well as in the central right lower lobe branch of the pulmonary artery. On the left, there is a single segmental embolus in the anterior basal segment lower lobe  branch, partially occlusive. Patent superior and inferior pulmonary veins bilaterally. Mild left atrial enlargement. Patchy coronary and aortic calcifications. Classic 3 vessel brachiocephalic arterial origin anatomy from of the aortic arch without proximal stenosis. No aortic dissection, aneurysm, or stenosis. Small pleural effusions left greater than right. Sub cm prevascular, AP window, and right paratracheal lymph nodes. No hilar adenopathy. Dependent atelectasis posteriorly in both lower lobes left greater than right. No confluent airspace consolidation or overt interstitial edema. Old lower thoracic compression deformity. Spurring across multiple contiguous levels in the mid and lower thoracic spine. Subcentimeter partially calcified stone in the dependent aspect of the gallbladder. Remainder of visualized upper abdomen unremarkable. Review of the MIP images confirms the above findings. IMPRESSION: 1. Bilateral central and segmental partially occlusive pulmonary emboli with CT evidence of right heartstrain (RV/LV Ratio = 1.2) consistent with at least submassive (intermediate risk)PE. The presence of right heart strain has been associated with anincreased risk of morbidity and mortality. Consultation with [facility] is recommended. Critical Value/emergent results were called by telephone at the time of interpretation on 04/13/2014 at 1:07 pm to Dr. Blanchie Dessert , who verbally acknowledged these results. 2. Atherosclerosis, including aortic and coronary artery disease. Please note that although the presence of coronary artery calcium documents the presence of coronary artery disease, the severity of this disease and any potential stenosis cannot be assessed on this non-gated CT examination. Assessment for potential risk factor modification, dietary therapy or pharmacologic therapy may be warranted, if clinically indicated. 3. Cholelithiasis Electronically Signed By: Arne Cleveland  M.D. On: 04/13/2014 13:09  Microbiology:  No results found for this or any previous visit (from the past 240 hour(s)).  Labs:  Basic Metabolic Panel:   Recent Labs  Lab  04/13/14 1023  04/14/14 0335   NA  137  141   K  4.1  4.3   CL  102  107   CO2  22  22   GLUCOSE  113*  120*   BUN  25*  28*   CREATININE  1.10  1.17   CALCIUM  9.2  8.5  Liver Function Tests:  No results found for this basename: AST, ALT, ALKPHOS, BILITOT, PROT, ALBUMIN, in the last 168 hours  No results found for this basename: LIPASE, AMYLASE, in the last 168 hours  No results found for this basename: AMMONIA, in the last 168 hours  CBC:   Recent Labs  Lab  04/13/14 1023   04/15/14 0053  04/16/14 0343  04/17/14 0322  04/18/14 0358  04/19/14 0404   WBC  15.3*  < >  13.7*  13.3*  12.3*  10.2  9.9   NEUTROABS  9.9*  --  --  --  --  --  --   HGB  10.8*  < >  10.4*  10.6*  10.7*  9.7*  9.5*   HCT  32.2*  < >  30.5*  31.4*  32.2*  29.5*  29.3*   MCV  92.0  < >  90.5  91.0  91.2  90.8  91.3   PLT  253  < >  233  254  297  281  280   < > = values in this interval not displayed.  Cardiac Enzymes:   Recent Labs  Lab  04/13/14 1414  04/13/14 1935  04/14/14 0335   TROPONINI  <0.30  <0.30  <0.30    BNP:  BNP (last 3 results)   Recent Labs   04/15/14 1450   PROBNP  729.0*    CBG:  No results found for this basename: GLUCAP, in the last 168 hours

## 2014-04-22 LAB — PROTIME-INR: INR: 3.6 — AB (ref 0.9–1.1)

## 2014-04-26 ENCOUNTER — Ambulatory Visit (INDEPENDENT_AMBULATORY_CARE_PROVIDER_SITE_OTHER): Payer: Medicare HMO | Admitting: Pharmacist Clinician (PhC)/ Clinical Pharmacy Specialist

## 2014-04-26 DIAGNOSIS — I2699 Other pulmonary embolism without acute cor pulmonale: Secondary | ICD-10-CM

## 2014-04-26 LAB — POCT INR: INR: 3.8

## 2014-04-29 ENCOUNTER — Emergency Department (HOSPITAL_COMMUNITY)
Admission: EM | Admit: 2014-04-29 | Discharge: 2014-04-29 | Disposition: A | Discharge status: Home / Self Care | Payer: Medicare HMO | Attending: Emergency Medicine | Admitting: Emergency Medicine

## 2014-04-29 ENCOUNTER — Encounter (HOSPITAL_COMMUNITY): Payer: Self-pay | Admitting: Emergency Medicine

## 2014-04-29 ENCOUNTER — Emergency Department (HOSPITAL_COMMUNITY): Payer: Medicare HMO

## 2014-04-29 DIAGNOSIS — E780 Pure hypercholesterolemia: Secondary | ICD-10-CM | POA: Diagnosis not present

## 2014-04-29 DIAGNOSIS — Z87891 Personal history of nicotine dependence: Secondary | ICD-10-CM | POA: Insufficient documentation

## 2014-04-29 DIAGNOSIS — Z7951 Long term (current) use of inhaled steroids: Secondary | ICD-10-CM | POA: Insufficient documentation

## 2014-04-29 DIAGNOSIS — Z7901 Long term (current) use of anticoagulants: Secondary | ICD-10-CM | POA: Insufficient documentation

## 2014-04-29 DIAGNOSIS — Z79899 Other long term (current) drug therapy: Secondary | ICD-10-CM | POA: Insufficient documentation

## 2014-04-29 DIAGNOSIS — Z9861 Coronary angioplasty status: Secondary | ICD-10-CM | POA: Insufficient documentation

## 2014-04-29 DIAGNOSIS — Z7982 Long term (current) use of aspirin: Secondary | ICD-10-CM | POA: Insufficient documentation

## 2014-04-29 DIAGNOSIS — I2510 Atherosclerotic heart disease of native coronary artery without angina pectoris: Secondary | ICD-10-CM | POA: Insufficient documentation

## 2014-04-29 DIAGNOSIS — D6832 Hemorrhagic disorder due to extrinsic circulating anticoagulants: Secondary | ICD-10-CM | POA: Diagnosis not present

## 2014-04-29 DIAGNOSIS — K219 Gastro-esophageal reflux disease without esophagitis: Secondary | ICD-10-CM | POA: Diagnosis not present

## 2014-04-29 DIAGNOSIS — M1990 Unspecified osteoarthritis, unspecified site: Secondary | ICD-10-CM | POA: Diagnosis not present

## 2014-04-29 DIAGNOSIS — Z7902 Long term (current) use of antithrombotics/antiplatelets: Secondary | ICD-10-CM | POA: Insufficient documentation

## 2014-04-29 DIAGNOSIS — Z86711 Personal history of pulmonary embolism: Secondary | ICD-10-CM | POA: Insufficient documentation

## 2014-04-29 DIAGNOSIS — Z9981 Dependence on supplemental oxygen: Secondary | ICD-10-CM | POA: Diagnosis not present

## 2014-04-29 DIAGNOSIS — I10 Essential (primary) hypertension: Secondary | ICD-10-CM | POA: Diagnosis not present

## 2014-04-29 DIAGNOSIS — R062 Wheezing: Secondary | ICD-10-CM | POA: Diagnosis present

## 2014-04-29 DIAGNOSIS — Z859 Personal history of malignant neoplasm, unspecified: Secondary | ICD-10-CM | POA: Diagnosis not present

## 2014-04-29 DIAGNOSIS — I2699 Other pulmonary embolism without acute cor pulmonale: Secondary | ICD-10-CM

## 2014-04-29 DIAGNOSIS — J90 Pleural effusion, not elsewhere classified: Secondary | ICD-10-CM | POA: Diagnosis not present

## 2014-04-29 DIAGNOSIS — Z9889 Other specified postprocedural states: Secondary | ICD-10-CM | POA: Insufficient documentation

## 2014-04-29 DIAGNOSIS — G4730 Sleep apnea, unspecified: Secondary | ICD-10-CM | POA: Diagnosis not present

## 2014-04-29 LAB — PROTIME-INR
INR: 2.1 — AB (ref 0.9–1.1)
INR: 2.11 — ABNORMAL HIGH (ref 0.00–1.49)
Prothrombin Time: 23.8 seconds — ABNORMAL HIGH (ref 11.6–15.2)

## 2014-04-29 NOTE — ED Provider Notes (Signed)
CSN: [id number]     Arrival date & time 04/29/14  2010 History   First MD Initiated Contact with Patient 04/29/14 2300     Chief Complaint  Patient presents with  . Wheezing     (Consider location/radiation/quality/duration/timing/severity/associated sxs/prior Treatment) HPI 80 year old male presents to emergency room with the complaint of a burning noise in his chest ongoing for last 2 days.  Patient recently discharged the 10th for PE.  Patient had INR checked at his doctor's office today.  Patient was concerned about the noise in his chest and decided to come in to get checked out.  He denies any shortness of breath, no pain and no cough no fever.  Patient is able to do his normal activities without limitations. Past Medical History  Diagnosis Date  . Hypertension   . Acid reflux   . High cholesterol   . Coronary artery disease   . Arthritis   . Sleep apnea     CPAP  . Peripheral vascular disease     Remote left carotid endarterectomy  . Cancer hx of skin cancer  . HOH (hard of hearing)     wears hearing aids, but still can not hear  . Incontinence of urine   . Constipation    Past Surgical History  Procedure Laterality Date  . Rotator cuff repair    . Prostate surgery    . Laparotomy    . Coronary stent placement    . Nasal sinus surgery    . Anal fissure repair    . Eye surgery      cataracts  . Irrigation and debridement abscess    . I&d extremity  06/28/2012    Procedure: IRRIGATION AND DEBRIDEMENT EXTREMITY;  Surgeon: Newt Minion, MD;  Location: [place];  Service: Orthopedics;  Laterality: Right;  Debridement Wound Right Leg, VAC, Antibiotic Beads, Apply A-Cell  . 2-d echocardiogram  03/07/2008    Ejection fraction greater than 55%. Mild concentric left ventricular hypertrophy.LV relaxation. mildly dilated left atrium. Mild mitral regurgitation.   . Cardiac catheterization      X 2 stents  . Colonoscopy w/ polypectomy    . Colon surgery      removed part of  the small intestine  . Lumbar laminectomy N/A 02/08/2014    Procedure: L3-4, L4-5 Decompression;  Surgeon: Marybelle Killings, MD;  Location: [place];  Service: Orthopedics;  Laterality: N/A;  L3-4, L4-5 Decompression   Family History  Problem Relation Age of Onset  . Hypertension Mother    History  Substance Use Topics  . Smoking status: Former Smoker    Types: Cigarettes    Quit date: 07/12/1978  . Smokeless tobacco: Never Used  . Alcohol Use: No    Review of Systems   See History of Present Illness; otherwise all other systems are reviewed and negative  Allergies  Ciprofloxacin and Dapsone  Home Medications   Prior to Admission medications   Medication Sig Start Date End Date Taking? Authorizing Provider  amLODipine (NORVASC) 10 MG tablet Take 10 mg by mouth daily.   Yes Historical Provider, MD  Ascorbic Acid (VITAMIN C) 1000 MG tablet Take 1,000 mg by mouth daily.   Yes Historical Provider, MD  aspirin EC 81 MG tablet Take 81 mg by mouth daily.   Yes Historical Provider, MD  Calcium Carbonate-Vitamin D (CALCIUM + D PO) Take 1 tablet by mouth daily.   Yes Historical Provider, MD  clopidogrel (PLAVIX) 75 MG tablet Take 1 tablet (  75 mg total) by mouth daily. 02/12/14  Yes Lorretta Harp, MD  diphenhydramine-acetaminophen (TYLENOL PM) 25-500 MG TABS Take 1 tablet by mouth at bedtime as needed. For pain   Yes Historical Provider, MD  ferrous sulfate 325 (65 FE) MG tablet Take 325 mg by mouth daily with breakfast.   Yes Historical Provider, MD  fluticasone (FLONASE) 50 MCG/ACT nasal spray Place 1 spray into both nostrils daily as needed for allergies.    Yes Historical Provider, MD  montelukast (SINGULAIR) 10 MG tablet Take 10 mg by mouth at bedtime.   Yes Historical Provider, MD  Multiple Vitamin (MULTIVITAMIN WITH MINERALS) TABS Take 1 tablet by mouth every morning.    Yes Historical Provider, MD  nitroGLYCERIN (NITROSTAT) 0.4 MG SL tablet Place 1 tablet (0.4 mg total) under the tongue  every 5 (five) minutes as needed for chest pain. 08/08/13  Yes Lorretta Harp, MD  Omega-3 Fatty Acids (FISH OIL) 1200 MG CAPS Take 1,200 mg by mouth daily.    Yes Historical Provider, MD  pantoprazole (PROTONIX) 40 MG tablet Take 1 tablet (40 mg total) by mouth daily. 02/12/14  Yes Lorretta Harp, MD  polyethylene glycol [facility] / GLYCOLAX) packet Take 17 g by mouth at bedtime.    Yes Historical Provider, MD  Pyridoxine HCl (VITAMIN B6 PO) Take 100 mg by mouth daily.    Yes Historical Provider, MD  simvastatin (ZOCOR) 10 MG tablet Take 1 tablet (10 mg total) by mouth at bedtime. 02/12/14  Yes Lorretta Harp, MD  tolterodine (DETROL LA) 4 MG 24 hr capsule Take 4 mg by mouth daily. 03/12/13  Yes Historical Provider, MD  warfarin (COUMADIN) 5 MG tablet Take 2.5-5 mg by mouth daily. Take 2.5 on Sundays and Tuesdays and whole pill rest of days 04/20/14  Yes Reyne Dumas, MD   BP 197/57  Pulse 68  Temp(Src) 97.9 F (36.6 C) (Oral)  Resp 18  Ht 5\' 8"  (1.727 m)  Wt 175 lb (79.379 kg)  BMI 26.61 kg/m2  SpO2 99% Physical Exam  Nursing note and vitals reviewed. Constitutional: He is oriented to person, place, and time. He appears well-developed and well-nourished. No distress.  HENT:  Head: Normocephalic and atraumatic.  Nose: Nose normal.  Mouth/Throat: Oropharynx is clear and moist.  Eyes: Conjunctivae and EOM are normal. Pupils are equal, round, and reactive to light.  Neck: Normal range of motion. Neck supple. No JVD present. No tracheal deviation present. No thyromegaly present.  Cardiovascular: Normal rate, regular rhythm, normal heart sounds and intact distal pulses.  Exam reveals no gallop and no friction rub.   No murmur heard. Pulmonary/Chest: Effort normal. No stridor. No respiratory distress. He has no wheezes. He has no rales. He exhibits no tenderness.  Patient has decreased lung sounds in left lower lung field.  He has rhonchi with bubbly-type noise.  Abdominal: Soft. Bowel sounds  are normal. He exhibits no distension and no mass. There is no tenderness. There is no rebound and no guarding.  Musculoskeletal: Normal range of motion. He exhibits no edema and no tenderness.  Lymphadenopathy:    He has no cervical adenopathy.  Neurological: He is alert and oriented to person, place, and time. He displays normal reflexes. He exhibits normal muscle tone. Coordination normal.  Skin: Skin is warm and dry. No rash noted. No erythema. No pallor.  Psychiatric: He has a normal mood and affect. His behavior is normal. Judgment and thought content normal.    ED Course  Procedures (including critical care time) Labs Review Labs Reviewed  PROTIME-INR - Abnormal; Notable for the following:    Prothrombin Time 23.8 (*)    INR 2.11 (*)    All other components within normal limits    Imaging Review Dg Chest 2 View  04/29/2014   CLINICAL DATA:  82 -year-old male with acute wheezing since yesterday. Initial encounter. Current history of recently diagnosed pulmonary emboli.  EXAM: CHEST  2 VIEW  COMPARISON:  Chest CTA 04/13/2014 and earlier.  FINDINGS: Stable lung volumes. Stable cardiac size and mediastinal contours. Mildly increased now moderate left pleural effusion. No pneumothorax or pulmonary edema. No other confluent pulmonary opacity. No acute osseous abnormality identified. Calcified atherosclerosis of the thoracic aorta.  IMPRESSION: Progressed, moderate left pleural effusion. Stable radiographic appearance of the chest otherwise, note recent diagnosis of acute pulmonary embolism.   Electronically Signed   By: Lars Pinks M.D.   On: 04/29/2014 22:00     EKG Interpretation None      MDM   Final diagnoses:  Pleural effusion on left  Pulmonary embolism  Anticoagulated on Coumadin    80 year old male recent pulmonary embolus, has moderate left pleural effusion.  Patient is quite adamant that he has no shortness of breath pain or other symptoms aside from a  rumbling sound in the left lower chest.  INR is therapeutic at 2.1.  He has close followup with Dr. Greta Doom this week for further INR checks.  I've instructed the patient to followup with Dr. severe before the end of the week for recheck the chest x-ray.  Should he have limitations in his breathing, he is to return to the ER.  Patient currently on Coumadin for his PEs, and should he need his pleural effusion drained, he will most likely need inpatient monitoring while he comes off Coumadin.  This was explained to patient and his family member.    Kalman Drape, MD 04/29/14 [phone]

## 2014-04-29 NOTE — ED Notes (Signed)
Pt st's he has heard some wheezing in left lower lung since yesterday.  Pt denies shortness of breath, cough or chest pain.  Pt st's he was discharge on 10/10 from hosp due to clots in lungs

## 2014-04-29 NOTE — Discharge Instructions (Signed)
Her chest x-ray today shows that you have fluid around her left lower lung, this is known as a pleural effusion.  It is most likely due to to your recent pulmonary embolus.  Please return to the emergency department if you have any problems with breathing, shortness of breath when walking or other new symptoms.  Followup with your doctor later this week for a repeat chest x-ray to monitor the size of your effusion.  Your Coumadin level was at a good level in the ED tonight.   Pleural Effusion The lining covering your lungs and the inside of your chest is called the pleura. Usually, the space between the two pleura contains no air and only a thin layer of fluid. A pleural effusion is an abnormal buildup of fluid in the pleural space. Fluid gathers when there is increased pressure in the lung vessels. This forces fluids out of the lungs and into the pleural space. Vessels may also leak fluids when there are infections, such as pneumonia, or other causes of soreness and redness (inflammation). Fluids leak into the lungs when protein in the blood is low or when certain vessels (lymphatics) are blocked. Finding a pleural effusion is important because it is usually caused by another disease. In order to treat a pleural effusion, your health care provider needs to find its cause. If left untreated, a large amount of fluid can build up and cause collapse of the lung. CAUSES   Heart failure.  Infections (pneumonia, tuberculosis), pulmonary embolism, pulmonary infarction.  Cancer (primary lung and metastatic), asbestosis.  Liver failure (cirrhosis).  Nephrotic syndrome, peritoneal dialysis, kidney problems (uremia).  Collagen vascular disease (systemic lupus erythematosus, rheumatoid arthritis).  Injury (trauma) to the chest or rupture of the digestive tube (esophagus).  Material in the chest or pleural space (hemothorax, chylothorax).  Pancreatitis.  Surgery.  Drug reactions. SYMPTOMS  A  pleural effusion can decrease the amount of space available for breathing and make you short of breath. The fluid can become infected, which may cause pain and fever. Often, the pain is worse when taking a deep breath. The underlying disease (heart failure, pneumonia, blood clot, tuberculosis, cancer) may also cause symptoms. DIAGNOSIS   Your health care provider can usually tell what is wrong by talking to you (taking a history), doing an exam, and taking a routine X-ray. If the X-ray shows fluid in your chest, often fluid is removed from your chest with a needle for testing (diagnostic thoracentesis).  Sometimes, more specialized X-rays may be needed.  Sometimes, a small piece of tissue is removed and examined by a specialist (biopsy). TREATMENT  Treatment varies based on what caused the pleural effusion. Treatments include:  Removing as much fluid as possible using a needle (thoracentesis) to improve the cough and shortness of breath. This is a simple procedure that can be done at bedside. The risks are bleeding, infection, collapse of a lung, or low blood pressure.  Placing a tube in the chest to drain the effusion (tube thoracostomy). This is often used when there is an infection in the fluid. This is a simple procedure that can often be done at bedside or in a clinic. The procedure may be painful. The risks are the same as using a needle to drain the fluid. The chest tube usually remains for a few days and is connected to suction to improve fluid drainage. After placement, the tube usually does not cause much discomfort.  Surgical removal of fibrous debris in and around the  pleural space (decortication). This may be done with a flexible telescope (thoracoscope) through a small or large cut (incision). This is helpful for patients who have fibrosis or scar tissue that prevents complete lung expansion. The risks are infection, blood loss, and side effects from general anesthesia.  Sometimes, a  procedure called pleurodesis is done. A chest tube is placed and the fluid is drained. Next, an agent (tetracycline, talc powder) is added to the pleural space. This causes the lung and chest wall to stick together (adhesion). This leaves no potential space for fluid to build up. The risks include infection, blood loss, and side effects from general anesthesia.  If the effusion is caused by infection, it may be treated with antibiotics and may improve without draining. HOME CARE INSTRUCTIONS   Take any medicines exactly as prescribed.  Follow up with your health care provider as directed.  Monitor your exercise capacity (the amount of walking you can do before you get short of breath).  Do not use any tobacco products including cigarettes, chewing tobacco, or electronic cigarettes. SEEK MEDICAL CARE IF:   Your exercise capacity seems to get worse or does not improve with time.  You do not recover from your illness.  You have drainage, redness, swelling, or pain at any incision or puncture sites. SEEK IMMEDIATE MEDICAL CARE IF:   Shortness of breath or chest pain develops or gets worse.  You have a fever.  You develop a new cough, especially if the mucus (phlegm) is discolored. MAKE SURE YOU:   Understand these instructions.  Will watch your condition.  Will get help right away if you are not doing well or get worse. Document Released: 06/28/2005 Document Revised: 11/12/2013 Document Reviewed: 02/17/2007 [facility] Patient Information 2015 [place], [place]. This information is not intended to replace advice given to you by your health care provider. Make sure you discuss any questions you have with your health care provider.  Warfarin: What You Need to Know Warfarin is an anticoagulant. Anticoagulants help prevent the formation of blood clots. They also help stop the growth of blood clots. Warfarin is sometimes referred to as a "blood thinner."  Normally, when body tissues are cut  or damaged, the blood clots in order to prevent blood loss. Sometimes clots form inside your blood vessels and obstruct the flow of blood through your circulatory system (thrombosis). These clots may travel through your bloodstream and become lodged in smaller blood vessels in your brain, which can cause a stroke, or in your lungs (pulmonary embolism). WHO SHOULD USE WARFARIN? Warfarin is prescribed for people at risk of developing harmful blood clots:  People with surgically implanted mechanical heart valves, irregular heart rhythms called atrial fibrillation, and certain clotting disorders.  People who have developed harmful blood clotting in the past, including those who have had a stroke or a pulmonary embolism, or thrombosis in their legs (deep vein thrombosis [DVT]).  People with an existing blood clot, such as a pulmonary embolism. WARFARIN DOSING Warfarin tablets come in different strengths. Each tablet strength is a different color, with the amount of warfarin (in milligrams) clearly printed on the tablet. If the color of your tablet is different than usual when you receive a new prescription, report it immediately to your pharmacist or health care provider. WARFARIN MONITORING The goal of warfarin therapy is to lessen the clotting tendency of blood but not prevent clotting completely. Your health care provider will monitor the anticoagulation effect of warfarin closely and adjust your dose as  needed. For your safety, blood tests called prothrombin time (PT) or international normalized ratio (INR) are used to measure the effects of warfarin. Both of these tests can be done with a finger stick or a blood draw. The longer it takes the blood to clot, the higher the PT or INR. Your health care provider will inform you of your "target" PT or INR range. If, at any time, your PT or INR is above the target range, there is a risk of bleeding. If your PT or INR is below the target range, there is a risk  of clotting. Whether you are started on warfarin while you are in the hospital or in your health care provider's office, you will need to have your PT or INR checked within one week of starting the medicine. Initially, some people are asked to have their PT or INR checked as much as twice a week. Once you are on a stable maintenance dose, the PT or INR is checked less often, usually once every 2 to 4 weeks. The warfarin dose may be adjusted if the PT or INR is not within the target range. It is important to keep all laboratory and health care provider follow-up appointments. Not keeping appointments could result in a chronic or permanent injury, pain, or disability because warfarin is a medicine that requires close monitoring. WHAT ARE THE SIDE EFFECTS OF WARFARIN?  Too much warfarin can cause bleeding (hemorrhage) from any part of the body. This may include bleeding from the gums, blood in the urine, bloody or dark stools, a nosebleed that is not easily stopped, coughing up blood, or vomiting blood.  Too little warfarin can increase the risk of blood clots.  Too little or too much warfarin can also increase the risk of a stroke.  Warfarin use may cause a skin rash or irritation, an unusual fever, continual nausea or stomach upset, or severe pain in your joints or back. SPECIAL PRECAUTIONS WHILE TAKING WARFARIN Warfarin should be taken exactly as directed. It is very important to take warfarin as directed since bleeding or blood clots could result in chronic or permanent injury, pain, or disability.  Take your medicine at the same time every day. If you forget to take your dose, you can take it if it is within 6 hours of when it was due.  Do not change the dose of warfarin on your own to make up for missed or extra doses.  If you miss more than 2 doses in a row, you should contact your health care provider for advice. Avoid situations that cause bleeding. You may have a tendency to bleed more  easily than usual while taking warfarin. The following actions can limit bleeding:  Using a softer toothbrush.  Flossing with waxed floss rather than unwaxed floss.  Shaving with an Copy rather than a blade.  Limiting the use of sharp objects.  Avoiding potentially harmful activities, such as contact sports. Warfarin and Pregnancy or Breastfeeding  Warfarin is not advised during the first trimester of pregnancy due to an increased risk of birth defects. In certain situations, a woman may take warfarin after her first trimester of pregnancy. A woman who becomes pregnant or plans to become pregnant while taking warfarin should notify her health care provider immediately.  Although warfarin does not pass into breast milk, a woman who wishes to breastfeed while taking warfarin should also consult with her health care provider. Alcohol, Smoking, and Illicit Drug Use  Alcohol affects how  warfarin works in the body. It is best to avoid alcoholic drinks or consume very small amounts while taking warfarin. In general, alcohol intake should be limited to 1 oz (30 mL) of liquor, 6 oz (180 mL) of wine, or 12 oz (360 mL) of beer each day. Notify your health care provider if you change your alcohol intake.  Smoking affects how warfarin works. It is best to avoid smoking while taking warfarin. Notify your health care provider if you change your smoking habits.  It is best to avoid all illicit drugs while taking warfarin since there are few studies that show how warfarin interacts with these drugs. Other Medicines and Dietary Supplements Many prescription and over-the-counter medicines can interfere with warfarin. Be sure all of your health care providers know you are taking warfarin. Notify your health care provider who prescribed warfarin for you or your pharmacist before starting or stopping any new medicines, including over-the-counter vitamins, dietary supplements, and pain medicines. Your  warfarin dose may need to be adjusted. Some common over-the-counter medicines that may increase the risk of bleeding while taking warfarin include:   Acetaminophen.  Aspirin.  Nonsteroidal anti-inflammatory medicines (NSAIDs), such as ibuprofen or naproxen.  Vitamin E. Dietary Considerations  Foods that have moderate or high amounts of vitamin K can interfere with warfarin. Avoid major changes in your diet or notify your health care provider before changing your diet. Eat a consistent amount of foods that have moderate or high amounts of vitamin K. Eating less foods containing vitamin K can increase the risk of bleeding. Eating more foods containing vitamin K can increase the risk of blood clots. Additional questions about dietary considerations can be discussed with a dietitian. Foods that are very high in vitamin K:  Greens, such as Swiss chard and beet, collard, mustard, or turnip greens (fresh or frozen, cooked).  Kale (fresh or frozen, cooked).  Parsley (raw).  Spinach (cooked). Foods that are high in vitamin K:  Asparagus (frozen, cooked).  Beans, green (frozen, cooked).  Broccoli.  Bok choy (cooked).  Brussels sprouts (fresh or frozen, cooked).  Cabbage (cooked).   Coleslaw. Foods that are moderately high in vitamin K:  Blueberries.  Black-eyed peas.  Endive (raw).  Green leaf lettuce (raw).  Green scallions (raw).  Kale (raw).  Okra (frozen, cooked).  Plantains (fried).  Romaine lettuce (raw).  Sauerkraut (canned).  Spinach (raw). CALL YOUR CLINIC OR HEALTH CARE PROVIDER IF YOU:  Plan to have any surgery or procedure.  Feel sick, especially if you have diarrhea or vomiting.  Experience or anticipate any major changes in your diet.  Start or stop a prescription or over-the-counter medicine.  Become, plan to become, or think you may be pregnant.  Are having heavier than usual menstrual periods.  Have had a fall, accident, or any symptoms  of bleeding or unusual bruising.  Develop an unusual fever. CALL 911 IN THE [place] OR GO TO THE EMERGENCY DEPARTMENT IF YOU:   Think you may be having an allergic reaction to warfarin. The signs of an allergic reaction could include itching, rash, hives, swelling, chest tightness, or trouble breathing.  See signs of blood in your urine. The signs could include reddish, pinkish, or tea-colored urine.  See signs of blood in your stools. The signs could include bright red or black stools.  Vomit or cough up blood. In these instances, the blood could have either a bright red or a "coffee-grounds" appearance.  Have bleeding that will not stop after applying  pressure for 30 minutes such as cuts, nosebleeds, or other injuries.  Have severe pain in your joints or back.  Have a new and severe headache.  Have sudden weakness or numbness of your face, arm, or leg, especially on one side of your body.  Have sudden confusion or trouble understanding.  Have sudden trouble seeing in one or both eyes.  Have sudden trouble walking, dizziness, loss of balance, or coordination.  Have trouble speaking or understanding (aphasia). Document Released: 06/28/2005 Document Revised: 11/12/2013 Document Reviewed: 12/22/2012 [facility] Patient Information 2015 [place], [place]. This information is not intended to replace advice given to you by your health care provider. Make sure you discuss any questions you have with your health care provider.

## 2014-04-30 ENCOUNTER — Telehealth: Payer: Self-pay | Admitting: Cardiovascular Disease

## 2014-04-30 NOTE — Telephone Encounter (Signed)
LABS HAVE NOT ARRIVED ON FAX MACHINE. FORWARD TO KRISTIN pharm-d

## 2014-04-30 NOTE — Telephone Encounter (Signed)
Larry Huffman called in stating they do not check coumadin levels at the office but they were able to do the labs. She stated that she would be faxing over his recent INR levels to our office.   Thanks

## 2014-05-02 ENCOUNTER — Telehealth: Payer: Self-pay | Admitting: Cardiovascular Disease

## 2014-05-02 NOTE — Telephone Encounter (Signed)
Labs have not arrived. Not in Larry Huffman's box. Will defer to Larry Huffman to advise

## 2014-05-02 NOTE — Telephone Encounter (Signed)
Spoke with [facility] - Dr. Teofilo Pod office with draw and dose coumadin levels

## 2014-05-02 NOTE — Telephone Encounter (Signed)
Mandy called in wanting to report some INR results for the pt. Please call  Thanks

## 2014-05-06 ENCOUNTER — Ambulatory Visit (INDEPENDENT_AMBULATORY_CARE_PROVIDER_SITE_OTHER): Payer: Medicare HMO | Admitting: Pharmacist Clinician (PhC)/ Clinical Pharmacy Specialist

## 2014-05-06 DIAGNOSIS — I2699 Other pulmonary embolism without acute cor pulmonale: Secondary | ICD-10-CM

## 2014-05-06 DIAGNOSIS — Z7901 Long term (current) use of anticoagulants: Secondary | ICD-10-CM

## 2014-05-06 LAB — POCT INR: INR: 2.4

## 2014-05-20 ENCOUNTER — Ambulatory Visit: Payer: Medicare HMO | Admitting: Pharmacist Clinician (PhC)/ Clinical Pharmacy Specialist

## 2014-05-22 ENCOUNTER — Ambulatory Visit (INDEPENDENT_AMBULATORY_CARE_PROVIDER_SITE_OTHER): Payer: Medicare HMO | Admitting: Pharmacist Clinician (PhC)/ Clinical Pharmacy Specialist

## 2014-05-22 DIAGNOSIS — Z7901 Long term (current) use of anticoagulants: Secondary | ICD-10-CM

## 2014-05-22 DIAGNOSIS — I2699 Other pulmonary embolism without acute cor pulmonale: Secondary | ICD-10-CM

## 2014-05-22 LAB — POCT INR: INR: 3.3

## 2014-05-22 MED ORDER — WARFARIN SODIUM 5 MG PO TABS: ORAL_TABLET | ORAL | Status: DC

## 2014-05-29 ENCOUNTER — Ambulatory Visit (INDEPENDENT_AMBULATORY_CARE_PROVIDER_SITE_OTHER): Payer: Medicare HMO | Admitting: Pharmacist Clinician (PhC)/ Clinical Pharmacy Specialist

## 2014-05-29 DIAGNOSIS — Z7901 Long term (current) use of anticoagulants: Secondary | ICD-10-CM

## 2014-05-29 DIAGNOSIS — I2699 Other pulmonary embolism without acute cor pulmonale: Secondary | ICD-10-CM

## 2014-05-29 LAB — POCT INR: INR: 3.8

## 2014-06-04 ENCOUNTER — Ambulatory Visit (INDEPENDENT_AMBULATORY_CARE_PROVIDER_SITE_OTHER): Payer: Medicare HMO | Admitting: Physician Assistant

## 2014-06-04 ENCOUNTER — Encounter: Payer: Self-pay | Admitting: Physician Assistant

## 2014-06-04 ENCOUNTER — Ambulatory Visit (INDEPENDENT_AMBULATORY_CARE_PROVIDER_SITE_OTHER): Payer: Medicare HMO | Admitting: *Deleted

## 2014-06-04 VITALS — BP 140/76 | HR 78 | Ht 68.0 in | Wt 180.0 lb

## 2014-06-04 DIAGNOSIS — Z7901 Long term (current) use of anticoagulants: Secondary | ICD-10-CM | POA: Insufficient documentation

## 2014-06-04 DIAGNOSIS — I10 Essential (primary) hypertension: Secondary | ICD-10-CM

## 2014-06-04 DIAGNOSIS — I2583 Coronary atherosclerosis due to lipid rich plaque: Secondary | ICD-10-CM

## 2014-06-04 DIAGNOSIS — G4733 Obstructive sleep apnea (adult) (pediatric): Secondary | ICD-10-CM

## 2014-06-04 DIAGNOSIS — I5032 Chronic diastolic (congestive) heart failure: Secondary | ICD-10-CM

## 2014-06-04 DIAGNOSIS — I2699 Other pulmonary embolism without acute cor pulmonale: Secondary | ICD-10-CM

## 2014-06-04 DIAGNOSIS — E785 Hyperlipidemia, unspecified: Secondary | ICD-10-CM

## 2014-06-04 DIAGNOSIS — I2510 Atherosclerotic heart disease of native coronary artery without angina pectoris: Secondary | ICD-10-CM

## 2014-06-04 LAB — POCT INR: INR: 2.7

## 2014-06-04 NOTE — Assessment & Plan Note (Signed)
INR 2.7. Continue current dose per Nehemiah Massed, pharmD

## 2014-06-04 NOTE — Assessment & Plan Note (Signed)
Patient has some mild lower extremity edema however he appears euvolemic.  Did ask him to weigh himself on a regular basis.

## 2014-06-04 NOTE — Assessment & Plan Note (Signed)
On a statin 

## 2014-06-04 NOTE — Assessment & Plan Note (Signed)
No complaints of angina. 

## 2014-06-04 NOTE — Patient Instructions (Signed)
Your physician wants you to follow-up in March with Dr. Gwenlyn Found. You will receive a reminder letter in the mail two months in advance. If you don't receive a letter, please call our office to schedule the follow-up appointment.  You will need to have your INR checked again in 2 weeks with Erasmo Downer - on Monday, Wednesday, Friday

## 2014-06-04 NOTE — Assessment & Plan Note (Signed)
Blood pressure is mildly elevated. No changes at this time.

## 2014-06-04 NOTE — Progress Notes (Signed)
Patient ID: Larry Huffman, male   DOB: 07-Mar-1924, 80 y.o.   MRN: [id number]    Date:  06/04/2014   ID:  Larry Huffman, DOB 12/20/1923, MRN [id number]  PCP:  Florina Ou, MD  Primary Cardiologist:  Gwenlyn Found     History of Present Illness: Larry Huffman is a 80 y.o. mildly overweight, Caucasian male, father of 2, grandfather to 8 grandchildren who saw Dr. Gwenlyn Found in 09/21/2013.  Patient's wife passed away in 2011/10/23.  They were married over 12 years.   He has a history of CAD status post LAD and circumflex stenting in the past with drug-eluting stents and subsequent negative Myoview as recently as August 17, 2012. He has had left carotid endarterectomy remotely as well which we follow by duplex ultrasound. This was most recently done this past July and was widely patent. He has normal lower extremity arterial Dopplers and venous Dopplers suggesting venous insufficiency. He has obstructive sleep apnea and compliant with CPAP followed by Dr. Claiborne Billings. He saw Dr. Mali Hilty who thought he was a good candidate for endovenous ablation.    The patient was admitted in early October of this year and found to have pulmonary embolism. He was started on Coumadin. His INR today is 2.7. He had an echocardiogram during that admission which showed an ejection fraction of 60-65%, mild LVH, grade 1 diastolic dysfunction. Right-sided visualization heart was limited. He was subsequently seen back in the ER on October 19 and chest x-ray revealed a moderate left pleural effusion. Patient had noticed wheeze noise coming from his lungs. This has resolved with today's visit. He states he is felt better in the last week he has all year.  He does get some lower extremity edema which by morning resolves. He does wear compression socks.  The patient currently denies nausea, vomiting, fever, chest pain, shortness of breath beyond his baseline, orthopnea, dizziness, PND, cough, congestion, abdominal pain, hematochezia, melena,  claudication.  Wt Readings from Last 3 Encounters:  06/04/14 180 lb (81.647 kg)  04/29/14 175 lb (79.379 kg)  04/13/14 185 lb (83.915 kg)     Past Medical History  Diagnosis Date  . Hypertension   . Acid reflux   . High cholesterol   . Coronary artery disease   . Arthritis   . Sleep apnea     CPAP  . Peripheral vascular disease     Remote left carotid endarterectomy  . Cancer hx of skin cancer  . HOH (hard of hearing)     wears hearing aids, but still can not hear  . Incontinence of urine   . Constipation     Current Outpatient Prescriptions  Medication Sig Dispense Refill  . amLODipine (NORVASC) 10 MG tablet Take 10 mg by mouth daily.    . Ascorbic Acid (VITAMIN C) 1000 MG tablet Take 1,000 mg by mouth daily.    Marland Kitchen aspirin EC 81 MG tablet Take 81 mg by mouth daily.    . Calcium Carbonate-Vitamin D (CALCIUM + D PO) Take 1 tablet by mouth daily.    . clopidogrel (PLAVIX) 75 MG tablet Take 1 tablet (75 mg total) by mouth daily. 90 tablet 2  . diphenhydramine-acetaminophen (TYLENOL PM) 25-500 MG TABS Take 1 tablet by mouth at bedtime as needed. For pain    . ferrous sulfate 325 (65 FE) MG tablet Take 325 mg by mouth daily with breakfast.    . fluticasone (FLONASE) 50 MCG/ACT nasal spray Place 1 spray into both nostrils  daily as needed for allergies.     . montelukast (SINGULAIR) 10 MG tablet Take 10 mg by mouth at bedtime.    . Multiple Vitamin (MULTIVITAMIN WITH MINERALS) TABS Take 1 tablet by mouth every morning.     . nitroGLYCERIN (NITROSTAT) 0.4 MG SL tablet Place 1 tablet (0.4 mg total) under the tongue every 5 (five) minutes as needed for chest pain. 25 tablet 2  . Omega-3 Fatty Acids (FISH OIL) 1200 MG CAPS Take 1,200 mg by mouth daily.     . pantoprazole (PROTONIX) 40 MG tablet Take 1 tablet (40 mg total) by mouth daily. 90 tablet 2  . polyethylene glycol (MIRALAX / GLYCOLAX) packet Take 17 g by mouth at bedtime.     . Pyridoxine HCl (VITAMIN B6 PO) Take 100 mg by mouth  daily.     . simvastatin (ZOCOR) 10 MG tablet Take 1 tablet (10 mg total) by mouth at bedtime. 90 tablet 2  . tolterodine (DETROL LA) 4 MG 24 hr capsule Take 4 mg by mouth daily.    Marland Kitchen warfarin (COUMADIN) 5 MG tablet Take 1 tablet by mouth daily or as directed by coumadin clinic 90 tablet 1   No current facility-administered medications for this visit.    Allergies:    Allergies  Allergen Reactions  . Ciprofloxacin Rash  . Dapsone Other (See Comments)    Lowers WBC count?? "made me very sick"    Social History:  The patient  reports that he quit smoking about 35 years ago. His smoking use included Cigarettes. He smoked 0.00 packs per day. He has never used smokeless tobacco. He reports that he does not drink alcohol or use illicit drugs.   Family history:   Family History  Problem Relation Age of Onset  . Hypertension Mother     ROS:  Please see the history of present illness.  All other systems reviewed and negative.   PHYSICAL EXAM: VS:  BP 140/76 mmHg  Pulse 78  Ht 5\' 8"  (1.727 m)  Wt 180 lb (81.647 kg)  BMI 27.38 kg/m2 Well nourished, well developed, in no acute distress HEENT: Pupils are equal round react to light accommodation extraocular movements are intact.  Neck: no JVDNo cervical lymphadenopathy. Cardiac: Regular rate and rhythm without murmurs rubs or gallops. Lungs:  clear to auscultation bilaterally, no wheezing, rhonchi or rales Abd: soft, nontender, positive bowel sounds all quadrants, no hepatosplenomegaly Ext: Trace lower extremity edema.  2+ radial and dorsalis pedis pulses. Skin: warm and dry Neuro:  Grossly normal   ASSESSMENT AND PLAN:  Problem List Items Addressed This Visit    CAD - CFX DES 7/07, LAD DES 10/08, cath 11/11- medical Rx    No complaints of angina    Chronic anticoagulation    INR 2.7. Continue current dose per Nehemiah Massed, pharmD    Chronic diastolic heart failure - Primary (Chronic)    Patient has some mild lower extremity  edema however he appears euvolemic.  Did ask him to weigh himself on a regular basis.    Essential hypertension    Blood pressure is mildly elevated. No changes at this time.    HLD (hyperlipidemia)    On a statin.    Obstructive sleep apnea: Uses CPAP   Pulmonary embolism

## 2014-06-19 ENCOUNTER — Ambulatory Visit (INDEPENDENT_AMBULATORY_CARE_PROVIDER_SITE_OTHER): Payer: Medicare HMO | Admitting: Pharmacist Clinician (PhC)/ Clinical Pharmacy Specialist

## 2014-06-19 DIAGNOSIS — I2699 Other pulmonary embolism without acute cor pulmonale: Secondary | ICD-10-CM

## 2014-06-19 LAB — POCT INR: INR: 1.5

## 2014-07-03 ENCOUNTER — Ambulatory Visit (INDEPENDENT_AMBULATORY_CARE_PROVIDER_SITE_OTHER): Payer: Medicare HMO | Admitting: Pharmacist Clinician (PhC)/ Clinical Pharmacy Specialist

## 2014-07-03 DIAGNOSIS — Z7901 Long term (current) use of anticoagulants: Secondary | ICD-10-CM

## 2014-07-03 DIAGNOSIS — I2699 Other pulmonary embolism without acute cor pulmonale: Secondary | ICD-10-CM

## 2014-07-03 LAB — POCT INR: INR: 1.3

## 2014-07-16 ENCOUNTER — Telehealth: Payer: Self-pay | Admitting: Physician Assistant

## 2014-07-16 NOTE — Telephone Encounter (Signed)
Received records from [facility] (Dr Salvadore Oxford) for appointment with Tarri Fuller, PA-C on 07/29/14.  Records given to [employer] (medical records) for Bryan's schedule on 07/29/14.  lp

## 2014-07-17 ENCOUNTER — Ambulatory Visit (INDEPENDENT_AMBULATORY_CARE_PROVIDER_SITE_OTHER): Payer: Medicare HMO | Admitting: Pharmacist Clinician (PhC)/ Clinical Pharmacy Specialist

## 2014-07-17 DIAGNOSIS — I2699 Other pulmonary embolism without acute cor pulmonale: Secondary | ICD-10-CM

## 2014-07-17 DIAGNOSIS — Z7901 Long term (current) use of anticoagulants: Secondary | ICD-10-CM

## 2014-07-17 LAB — POCT INR: INR: 1.8

## 2014-07-26 ENCOUNTER — Ambulatory Visit: Payer: Medicare HMO | Admitting: Pharmacist Clinician (PhC)/ Clinical Pharmacy Specialist

## 2014-07-29 ENCOUNTER — Ambulatory Visit (INDEPENDENT_AMBULATORY_CARE_PROVIDER_SITE_OTHER): Payer: Medicare HMO | Admitting: Pharmacist Clinician (PhC)/ Clinical Pharmacy Specialist

## 2014-07-29 ENCOUNTER — Ambulatory Visit (INDEPENDENT_AMBULATORY_CARE_PROVIDER_SITE_OTHER): Payer: Medicare HMO | Admitting: Physician Assistant

## 2014-07-29 ENCOUNTER — Encounter: Payer: Self-pay | Admitting: Physician Assistant

## 2014-07-29 VITALS — BP 130/60 | HR 64 | Ht 67.0 in | Wt 182.9 lb

## 2014-07-29 DIAGNOSIS — I82401 Acute embolism and thrombosis of unspecified deep veins of right lower extremity: Secondary | ICD-10-CM

## 2014-07-29 DIAGNOSIS — E785 Hyperlipidemia, unspecified: Secondary | ICD-10-CM

## 2014-07-29 DIAGNOSIS — I2699 Other pulmonary embolism without acute cor pulmonale: Secondary | ICD-10-CM

## 2014-07-29 DIAGNOSIS — Z7901 Long term (current) use of anticoagulants: Secondary | ICD-10-CM

## 2014-07-29 DIAGNOSIS — I2583 Coronary atherosclerosis due to lipid rich plaque: Secondary | ICD-10-CM

## 2014-07-29 DIAGNOSIS — I5032 Chronic diastolic (congestive) heart failure: Secondary | ICD-10-CM

## 2014-07-29 DIAGNOSIS — Z01810 Encounter for preprocedural cardiovascular examination: Secondary | ICD-10-CM

## 2014-07-29 DIAGNOSIS — I739 Peripheral vascular disease, unspecified: Secondary | ICD-10-CM

## 2014-07-29 DIAGNOSIS — I2510 Atherosclerotic heart disease of native coronary artery without angina pectoris: Secondary | ICD-10-CM

## 2014-07-29 DIAGNOSIS — G4733 Obstructive sleep apnea (adult) (pediatric): Secondary | ICD-10-CM

## 2014-07-29 LAB — POCT INR: INR: 2.1

## 2014-07-29 NOTE — Progress Notes (Signed)
Patient ID: Larry Huffman, male   DOB: 03-Jun-1924, 81 y.o.   MRN: [id number]    Date:  07/29/2014   ID:  Larry Huffman, DOB Feb 06, 1924, MRN [id number]  PCP:  Florina Ou, MD  Primary Cardiologist:  Gwenlyn Found  Chief Complaint  Patient presents with  . surgical clearance    Dr. Eveline Keto surgeon; c/o SOB; occasional dizzy spells (staggers); wears compression for swelling     History of Present Illness: Larry Huffman is a 81 y.o. male mildly overweight, Caucasian male, father of 2, grandfather to 8 grandchildren who saw Dr. Gwenlyn Found in 2013/10/16. Patient's wife passed away in October 17, 2011. They were married over 67 years. He has a history of CAD status post LAD and circumflex stenting in the past with drug-eluting stents and subsequent negative Myoview as recently as August 17, 2012. He has had left carotid endarterectomy remotely as well which we follow by duplex ultrasound. This was most recently done this past July and was widely patent. He has normal lower extremity arterial Dopplers and venous Dopplers in Oct 2015 revealed superficial vein thrombosis involving the right greater saphenous vein. He has obstructive sleep apnea and compliant with CPAP followed by Dr. Claiborne Billings. He saw Dr. Mali Hilty who thought he was a good candidate for endovenous ablation.    The patient was admitted in early October of this year and found to have pulmonary embolism. He was started on Coumadin.  He had an echocardiogram during that admission which showed an ejection fraction of 60-65%, mild LVH, grade 1 diastolic dysfunction. Right-sided visualization heart was limited. He was subsequently seen back in the ER on October 19 and chest x-ray revealed a moderate left pleural effusion.  He does wear compression socks.  She presents today for surgical clearance. He's had urinary leakage following a TURP procedure remotely and is very interested in artificial sphincter placement with Dr.Terlecki.  He reports doing well.  He does get  some lower extremity edema but denies orthopnea PND or dyspnea.  He also denies nausea, vomiting, fever, chest pain, dizziness, cough, congestion, abdominal pain, hematochezia, melena, lower extremity edema, claudication.  Wt Readings from Last 3 Encounters:  07/29/14 182 lb 14.4 oz (82.963 kg)  06/04/14 180 lb (81.647 kg)  04/29/14 175 lb (79.379 kg)     Past Medical History  Diagnosis Date  . Hypertension   . Acid reflux   . High cholesterol   . Coronary artery disease   . Arthritis   . Sleep apnea     CPAP  . Peripheral vascular disease     Remote left carotid endarterectomy  . Cancer hx of skin cancer  . HOH (hard of hearing)     wears hearing aids, but still can not hear  . Incontinence of urine   . Constipation     Current Outpatient Prescriptions  Medication Sig Dispense Refill  . amLODipine (NORVASC) 10 MG tablet Take 10 mg by mouth daily.    . Ascorbic Acid (VITAMIN C) 1000 MG tablet Take 1,000 mg by mouth daily.    Marland Kitchen aspirin EC 81 MG tablet Take 81 mg by mouth daily.    . Calcium Carbonate-Vitamin D (CALCIUM + D PO) Take 1 tablet by mouth daily.    . clopidogrel (PLAVIX) 75 MG tablet Take 1 tablet (75 mg total) by mouth daily. 90 tablet 2  . diphenhydramine-acetaminophen (TYLENOL PM) 25-500 MG TABS Take 1 tablet by mouth at bedtime as needed. For pain    .  ferrous sulfate 325 (65 FE) MG tablet Take 325 mg by mouth daily with breakfast.    . fluticasone (FLONASE) 50 MCG/ACT nasal spray Place 1 spray into both nostrils daily as needed for allergies.     . montelukast (SINGULAIR) 10 MG tablet Take 10 mg by mouth at bedtime.    . Multiple Vitamin (MULTIVITAMIN WITH MINERALS) TABS Take 1 tablet by mouth every morning.     . nitroGLYCERIN (NITROSTAT) 0.4 MG SL tablet Place 1 tablet (0.4 mg total) under the tongue every 5 (five) minutes as needed for chest pain. 25 tablet 2  . Omega-3 Fatty Acids (FISH OIL) 1200 MG CAPS Take 1,200 mg by mouth daily.     . pantoprazole  (PROTONIX) 40 MG tablet Take 1 tablet (40 mg total) by mouth daily. 90 tablet 2  . polyethylene glycol (MIRALAX / GLYCOLAX) packet Take 17 g by mouth at bedtime.     . Pyridoxine HCl (VITAMIN B6 PO) Take 100 mg by mouth daily.     . simvastatin (ZOCOR) 10 MG tablet Take 1 tablet (10 mg total) by mouth at bedtime. 90 tablet 2  . tolterodine (DETROL LA) 4 MG 24 hr capsule Take 4 mg by mouth daily.    Marland Kitchen warfarin (COUMADIN) 5 MG tablet Take 1 tablet by mouth daily or as directed by coumadin clinic 90 tablet 1   No current facility-administered medications for this visit.    Allergies:    Allergies  Allergen Reactions  . Ciprofloxacin Rash  . Dapsone Other (See Comments)    Lowers WBC count?? "made me very sick"    Social History:  The patient  reports that he quit smoking about 36 years ago. His smoking use included Cigarettes. He has never used smokeless tobacco. He reports that he does not drink alcohol or use illicit drugs.   Family history:   Family History  Problem Relation Age of Onset  . Hypertension Mother     ROS:  Please see the history of present illness.  All other systems reviewed and negative.   PHYSICAL EXAM: VS:  BP 130/60 mmHg  Pulse 64  Ht 5\' 7"  (1.702 m)  Wt 182 lb 14.4 oz (82.963 kg)  BMI 28.64 kg/m2 Well nourished, well developed, in no acute distress HEENT: Pupils are equal round react to light accommodation extraocular movements are intact.  Neck: no JVDNo cervical lymphadenopathy. Cardiac: Regular rate and rhythm 1/6 systolic murmur Lungs:  clear to auscultation bilaterally, no wheezing, rhonchi or rales Abd: soft, nontender, positive bowel sounds all quadrants, no hepatosplenomegaly Ext: 1+ lower extremity edema.  2+ radial and dorsalis pedis pulses. Skin: warm and dry Neuro:  Grossly normal  EKG: Sinus rhythm first degree AV block rate 64 bpm.  ASSESSMENT AND PLAN:  Problem List Items Addressed This Visit    Pulmonary embolism    Currently  anticoagulated with Coumadin. Venous Dopplers in October 2015 revealed thrombosis in the involving the greater saphenous vein in the right leg.  He is cleared for surgery I would recommend Lovenox bridge to limit  time off of anticoagulation.  I'm going repeat his venous Dopplers see if there is residual clot.      Peripheral arterial disease: History of left carotid endarterectomy    His last carotid Dopplers were March of last year and showed minimal carotid disease and no change from prior. His last lower extremity arterial Dopplers were July 2013 and had normal ABIs.      Obstructive sleep apnea:  Uses CPAP   HLD (hyperlipidemia)    Continue statin      Chronic diastolic heart failure (Chronic)    He does have some mild lower extremity edema but otherwise appears euvolemic      Chronic anticoagulation   CAD - CFX DES 7/07, LAD DES 10/08, cath 11/11- medical Rx    No complaints of angina. He is here for surgical clearance and his last stress test was 2 years ago. We'll repeat a Lexiscan Myoview.      Relevant Orders   EKG 12-Lead    Other Visit Diagnoses    Preoperative cardiovascular examination    -  Primary    Relevant Orders    Myocardial Perfusion Imaging    Lower extremity deep venous thrombosis, right        Relevant Orders    Lower Extremity Venous Duplex Bilateral

## 2014-07-29 NOTE — Assessment & Plan Note (Signed)
No complaints of angina. He is here for surgical clearance and his last stress test was 2 years ago. We'll repeat a Lexiscan Myoview.

## 2014-07-29 NOTE — Assessment & Plan Note (Signed)
He does have some mild lower extremity edema but otherwise appears euvolemic

## 2014-07-29 NOTE — Assessment & Plan Note (Signed)
His last carotid Dopplers were March of last year and showed minimal carotid disease and no change from prior. His last lower extremity arterial Dopplers were July 2013 and had normal ABIs.

## 2014-07-29 NOTE — Assessment & Plan Note (Signed)
Currently anticoagulated with Coumadin. Venous Dopplers in October 2015 revealed thrombosis in the involving the greater saphenous vein in the right leg.  He is cleared for surgery I would recommend Lovenox bridge to limit  time off of anticoagulation.  I'm going repeat his venous Dopplers see if there is residual clot.

## 2014-07-29 NOTE — Patient Instructions (Signed)
Your physician has requested that you have a lexiscan myoview. For further information please visit [email or web address]. Please follow instruction sheet, as given.  Your physician has requested that you have a lower  extremity venous duplex. This test is an ultrasound of the veins in the legs. It looks at venous blood flow that carries blood from the heart to the legs. Allow one hour for a Lower Venous exam. There are no restrictions or special instructions.  Your physician wants you to follow-up in: 6 months with Dr. Gwenlyn Found. You will receive a reminder letter in the mail two months in advance. If you don't receive a letter, please call our office to schedule the follow-up appointment.

## 2014-07-29 NOTE — Assessment & Plan Note (Signed)
Continue statin. 

## 2014-08-06 ENCOUNTER — Telehealth (HOSPITAL_COMMUNITY): Payer: Self-pay

## 2014-08-06 NOTE — Telephone Encounter (Signed)
Encounter complete. 

## 2014-08-08 ENCOUNTER — Ambulatory Visit (HOSPITAL_BASED_OUTPATIENT_CLINIC_OR_DEPARTMENT_OTHER)
Admission: RE | Admit: 2014-08-08 | Discharge: 2014-08-08 | Disposition: A | Discharge status: Home / Self Care | Payer: Medicare HMO | Source: Ambulatory Visit | Attending: Physician Assistant | Admitting: Physician Assistant

## 2014-08-08 ENCOUNTER — Ambulatory Visit (HOSPITAL_COMMUNITY)
Admission: RE | Admit: 2014-08-08 | Discharge: 2014-08-08 | Disposition: A | Discharge status: Home / Self Care | Payer: Medicare HMO | Source: Ambulatory Visit | Attending: Cardiovascular Disease | Admitting: Cardiovascular Disease

## 2014-08-08 DIAGNOSIS — E663 Overweight: Secondary | ICD-10-CM | POA: Insufficient documentation

## 2014-08-08 DIAGNOSIS — I739 Peripheral vascular disease, unspecified: Secondary | ICD-10-CM | POA: Insufficient documentation

## 2014-08-08 DIAGNOSIS — I10 Essential (primary) hypertension: Secondary | ICD-10-CM | POA: Diagnosis not present

## 2014-08-08 DIAGNOSIS — R42 Dizziness and giddiness: Secondary | ICD-10-CM | POA: Insufficient documentation

## 2014-08-08 DIAGNOSIS — R0609 Other forms of dyspnea: Secondary | ICD-10-CM | POA: Diagnosis not present

## 2014-08-08 DIAGNOSIS — R9431 Abnormal electrocardiogram [ECG] [EKG]: Secondary | ICD-10-CM | POA: Diagnosis not present

## 2014-08-08 DIAGNOSIS — Z01810 Encounter for preprocedural cardiovascular examination: Secondary | ICD-10-CM

## 2014-08-08 DIAGNOSIS — E785 Hyperlipidemia, unspecified: Secondary | ICD-10-CM | POA: Diagnosis not present

## 2014-08-08 DIAGNOSIS — Z01818 Encounter for other preprocedural examination: Secondary | ICD-10-CM | POA: Insufficient documentation

## 2014-08-08 DIAGNOSIS — I82401 Acute embolism and thrombosis of unspecified deep veins of right lower extremity: Secondary | ICD-10-CM

## 2014-08-08 MED ORDER — TECHNETIUM TC 99M SESTAMIBI GENERIC - CARDIOLITE
31.5000 | Freq: Once | INTRAVENOUS | Status: AC | PRN
  Administered 2014-08-08: 32 via INTRAVENOUS

## 2014-08-08 MED ORDER — TECHNETIUM TC 99M SESTAMIBI GENERIC - CARDIOLITE
10.4000 | Freq: Once | INTRAVENOUS | Status: AC | PRN
  Administered 2014-08-08: 10 via INTRAVENOUS

## 2014-08-08 MED ORDER — REGADENOSON 0.4 MG/5ML IV SOLN
0.4000 mg | Freq: Once | INTRAVENOUS | Status: AC
  Administered 2014-08-08: 0.4 mg via INTRAVENOUS

## 2014-08-08 NOTE — Procedures (Addendum)
[facility] [address] [place] [address] [phone]  Cardiology Nuclear Med Study  Larry Huffman is a 81 y.o. male     MRN : [id number]     DOB: Nov 25, 1923  Procedure Date: 08/08/2014  Nuclear Med Background Indication for Stress Test:  Surgical Clearance and Abnormal EKG History:  Asthma and CAD;STENT/PTCA-LAD and CFX;PVD;DVT;Last NUC MPI on 08/17/2012 Cardiac Risk Factors: Carotid Disease, Family History - CAD, History of Smoking, Hypertension, Lipids, Overweight, PVD and DVT;PE X2;LEFT ENDARTECTOMY  Symptoms:  Dizziness, DOE and Light-Headedness   Nuclear Pre-Procedure Caffeine/Decaff Intake:  12:00am NPO After: 10 am   IV Site: R Forearm  IV 0.9% NS with Angio Cath:  22g  Chest Size (in):  38" IV Started by: Rolene Course, RN  Height: 5\' 7"  (1.702 m)  Cup Size: n/a  BMI:  Body mass index is 28.5 kg/(m^2). Weight:  182 lb (82.555 kg)   Tech Comments:  n/a    Nuclear Med Study 1 or 2 day study: 1 day  Stress Test Type:  [place] Provider:  Quay Burow, MD   Resting Radionuclide: Technetium 7m Sestamibi  Resting Radionuclide Dose: 10.4 mCi   Stress Radionuclide:  Technetium 34m Sestamibi  Stress Radionuclide Dose: 31.5 mCi           Stress Protocol Rest HR: 62 Stress HR: 76  Rest BP: 160/69 Stress BP: 166/61  Exercise Time (min): n/a METS: n/a          Dose of Adenosine (mg):  n/a Dose of Lexiscan: 0.4 mg  Dose of Atropine (mg): n/a Dose of Dobutamine: n/a mcg/kg/min (at max HR)  Stress Test Technologist: Mellody Memos, CCT Nuclear Technologist: Imagene Riches, CNMT   Rest Procedure:  Myocardial perfusion imaging was performed at rest 45 minutes following the intravenous administration of Technetium 86m Sestamibi. Stress Procedure:  The patient received IV Lexiscan 0.4 mg over 15-seconds.  Technetium 64m Sestamibi injected IV at 30-seconds.  There were no significant changes with  Lexiscan.  Quantitative spect images were obtained after a 45 minute delay.  Transient Ischemic Dilatation (Normal <1.22):  1.01 QGS EDV:  110 ml QGS ESV:  43 ml LV Ejection Fraction: 61%     Rest ECG: NSR with non-specific ST-T wave changes  Stress ECG: No significant change from baseline ECG  QPS Raw Data Images:  Normal; no motion artifact; normal heart/lung ratio. Stress Images:  There is decreased uptake in the inferolateral wall. Rest Images:  Comparison with the stress images reveals no significant change. Subtraction (SDS):  There is a fixed defect that is most consistent with a previous infarction. LV Wall Motion:  mild inferolateral hypokinesis, normal overall systolic function  Impression Exercise Capacity:  Lexiscan with no exercise. BP Response:  Normal blood pressure response. Clinical Symptoms:  No significant symptoms noted. ECG Impression:  No significant ECG changes with Lexiscan. Comparison with Prior Nuclear Study: No significant change from previous study   Overall Impression:  Low risk stress nuclear study with a moderate size area of mild fixed inferolateral hypoperfusion and hypokinesis, consistent with previous infarction in the left circumflex artery distribution.   Sanda Klein, MD  08/08/2014 5:06 PM

## 2014-08-08 NOTE — Progress Notes (Signed)
Lower Extremity Venous Duplex Completed. °Brianna L Mazza,RVT °

## 2014-08-09 ENCOUNTER — Encounter: Payer: Self-pay | Admitting: *Deleted

## 2014-08-15 ENCOUNTER — Encounter: Payer: Self-pay | Admitting: *Deleted

## 2014-08-19 ENCOUNTER — Ambulatory Visit (INDEPENDENT_AMBULATORY_CARE_PROVIDER_SITE_OTHER): Payer: Medicare HMO | Admitting: Pharmacist Clinician (PhC)/ Clinical Pharmacy Specialist

## 2014-08-19 DIAGNOSIS — I2699 Other pulmonary embolism without acute cor pulmonale: Secondary | ICD-10-CM

## 2014-08-19 DIAGNOSIS — Z7901 Long term (current) use of anticoagulants: Secondary | ICD-10-CM

## 2014-08-19 LAB — POCT INR: INR: 2

## 2014-09-06 ENCOUNTER — Telehealth: Payer: Self-pay | Admitting: *Deleted

## 2014-09-06 NOTE — Telephone Encounter (Signed)
error 

## 2014-09-06 NOTE — Telephone Encounter (Signed)
Pt came into office asking "Where are we on having urinal device implanted?". Advised patient that he would need to contact his urologist. Notified him that clearance had been faxed to his urologist. Pt verbalized understanding.

## 2014-09-09 ENCOUNTER — Ambulatory Visit (INDEPENDENT_AMBULATORY_CARE_PROVIDER_SITE_OTHER): Payer: Medicare HMO | Admitting: Pharmacist Clinician (PhC)/ Clinical Pharmacy Specialist

## 2014-09-09 ENCOUNTER — Telehealth (HOSPITAL_COMMUNITY): Payer: Self-pay | Admitting: *Deleted

## 2014-09-09 DIAGNOSIS — Z7901 Long term (current) use of anticoagulants: Secondary | ICD-10-CM

## 2014-09-09 DIAGNOSIS — I2699 Other pulmonary embolism without acute cor pulmonale: Secondary | ICD-10-CM

## 2014-09-09 LAB — POCT INR: INR: 1.6

## 2014-09-12 ENCOUNTER — Telehealth: Payer: Self-pay | Admitting: Cardiovascular Disease

## 2014-09-19 NOTE — Telephone Encounter (Signed)
Closed Encounter  °

## 2014-09-24 ENCOUNTER — Encounter: Payer: Self-pay | Admitting: Cardiovascular Disease

## 2014-09-24 ENCOUNTER — Ambulatory Visit (INDEPENDENT_AMBULATORY_CARE_PROVIDER_SITE_OTHER): Payer: Medicare HMO | Admitting: *Deleted

## 2014-09-24 ENCOUNTER — Ambulatory Visit (INDEPENDENT_AMBULATORY_CARE_PROVIDER_SITE_OTHER): Payer: Medicare HMO | Admitting: Cardiovascular Disease

## 2014-09-24 VITALS — BP 156/64 | HR 56 | Ht 64.0 in | Wt 182.0 lb

## 2014-09-24 DIAGNOSIS — I739 Peripheral vascular disease, unspecified: Secondary | ICD-10-CM

## 2014-09-24 DIAGNOSIS — Z7901 Long term (current) use of anticoagulants: Secondary | ICD-10-CM

## 2014-09-24 DIAGNOSIS — E785 Hyperlipidemia, unspecified: Secondary | ICD-10-CM

## 2014-09-24 DIAGNOSIS — I2510 Atherosclerotic heart disease of native coronary artery without angina pectoris: Secondary | ICD-10-CM

## 2014-09-24 DIAGNOSIS — I2583 Coronary atherosclerosis due to lipid rich plaque: Secondary | ICD-10-CM

## 2014-09-24 DIAGNOSIS — I2699 Other pulmonary embolism without acute cor pulmonale: Secondary | ICD-10-CM

## 2014-09-24 DIAGNOSIS — I10 Essential (primary) hypertension: Secondary | ICD-10-CM

## 2014-09-24 LAB — POCT INR: INR: 2.1

## 2014-09-24 NOTE — Assessment & Plan Note (Signed)
Remote left carotid endarterectomy with carotid Dopplers performed 10/03/13 revealing a widely patent endarterectomy site. We will continue to follow this on an annual basis.

## 2014-09-24 NOTE — Assessment & Plan Note (Signed)
History of hyperlipidemia on simvastatin 10 mg a day followed by his PCP

## 2014-09-24 NOTE — Assessment & Plan Note (Signed)
History of hypertension with blood pressure measured at 156/64. He is on amlodipine 10 mg. Continued current meds at current dosing

## 2014-09-24 NOTE — Progress Notes (Signed)
09/24/2014 Larry Huffman   1924-04-06  [id number]  Primary Physician Florina Ou, MD Primary Cardiologist: Lorretta Harp MD [name]   HPI:  The patient returns today for followup. He is an 81 year old mildly overweight, married Caucasian male, father of 2, grandfather to 8 grandchildren who I saw in the office 12 months ago. He has a history of CAD status post LAD and circumflex stenting in the past with drug-eluting stents and subsequent negative Myoview as recently as August 17, 2012. He has had left carotid endarterectomy remotely as well which we follow by duplex ultrasound. This was most recently done this past March of last year and was widely patent. He has normal lower extremity arterial Dopplers and venous Dopplers suggesting venous insufficiency. He has obstructive sleep apnea on CPAP followed by Dr. Claiborne Billings. He saw Dr. Mali Hilty who thought he was a good candidate for endovenous ablation. He had admission in October of last year for acute pulmonary embolism and has been on Coumadin at coagulation. He really needs a urologic procedure and had a pharmacologic Myoview stress test performed in January of this year which was low with a nonischemic.   Current Outpatient Prescriptions  Medication Sig Dispense Refill  . amLODipine (NORVASC) 10 MG tablet Take 10 mg by mouth daily.    . Ascorbic Acid (VITAMIN C) 1000 MG tablet Take 1,000 mg by mouth daily.    Marland Kitchen aspirin EC 81 MG tablet Take 81 mg by mouth daily.    . Calcium Carbonate-Vitamin D (CALCIUM + D PO) Take 1 tablet by mouth daily.    . clopidogrel (PLAVIX) 75 MG tablet Take 1 tablet (75 mg total) by mouth daily. 90 tablet 2  . diphenhydramine-acetaminophen (TYLENOL PM) 25-500 MG TABS Take 1 tablet by mouth at bedtime as needed. For pain    . ferrous sulfate 325 (65 FE) MG tablet Take 325 mg by mouth daily with breakfast.    . fluticasone (FLONASE) 50 MCG/ACT nasal spray Place 1 spray into both nostrils daily as  needed for allergies.     . Loratadine 10 MG CAPS Take 1 tablet daily by mouth, as needed for allergies.    . montelukast (SINGULAIR) 10 MG tablet Take 10 mg by mouth at bedtime.    . Multiple Vitamin (MULTIVITAMIN WITH MINERALS) TABS Take 1 tablet by mouth every morning.     . nitroGLYCERIN (NITROSTAT) 0.4 MG SL tablet Place 1 tablet (0.4 mg total) under the tongue every 5 (five) minutes as needed for chest pain. 25 tablet 2  . Omega-3 Fatty Acids (FISH OIL) 1200 MG CAPS Take 1,200 mg by mouth daily.     . pantoprazole (PROTONIX) 40 MG tablet Take 1 tablet (40 mg total) by mouth daily. 90 tablet 2  . polyethylene glycol (MIRALAX / GLYCOLAX) packet Take 17 g by mouth at bedtime.     Marland Kitchen PROVENTIL HFA 108 (90 BASE) MCG/ACT inhaler     . Pyridoxine HCl (VITAMIN B6 PO) Take 100 mg by mouth daily.     . simvastatin (ZOCOR) 10 MG tablet Take 1 tablet (10 mg total) by mouth at bedtime. 90 tablet 2  . tolterodine (DETROL LA) 4 MG 24 hr capsule Take 4 mg by mouth daily.    Marland Kitchen warfarin (COUMADIN) 5 MG tablet Take 1 tablet by mouth daily or as directed by coumadin clinic 90 tablet 1   No current facility-administered medications for this visit.    Allergies  Allergen Reactions  .  Ciprofloxacin Rash  . Dapsone Other (See Comments)    Lowers WBC count?? "made me very sick"    History   Social History  . Marital Status: Married    Spouse Name: N/A  . Number of Children: N/A  . Years of Education: N/A   Occupational History  . Not on file.   Social History Main Topics  . Smoking status: Former Smoker    Types: Cigarettes    Quit date: 07/12/1978  . Smokeless tobacco: Never Used  . Alcohol Use: No  . Drug Use: No  . Sexual Activity: Not on file   Other Topics Concern  . Not on file   Social History Narrative     Review of Systems: General: negative for chills, fever, night sweats or weight changes.  Cardiovascular: negative for chest pain, dyspnea on exertion, edema, orthopnea,  palpitations, paroxysmal nocturnal dyspnea or shortness of breath Dermatological: negative for rash Respiratory: negative for cough or wheezing Urologic: negative for hematuria Abdominal: negative for nausea, vomiting, diarrhea, bright red blood per rectum, melena, or hematemesis Neurologic: negative for visual changes, syncope, or dizziness All other systems reviewed and are otherwise negative except as noted above.    Blood pressure 156/64, pulse 56, height 5\' 4"  (1.626 m), weight 182 lb (82.555 kg).  General appearance: alert and no distress Neck: no adenopathy, no carotid bruit, no JVD, supple, symmetrical, trachea midline and thyroid not enlarged, symmetric, no tenderness/mass/nodules Lungs: clear to auscultation bilaterally Heart: regular rate and rhythm, S1, S2 normal, no murmur, click, rub or gallop Extremities: extremities normal, atraumatic, no cyanosis or edema  EKG not performed today  ASSESSMENT AND PLAN:   Pulmonary embolism History of pulmonary embolism in October of last year on Coumadin anticoagulation.   Peripheral arterial disease: History of left carotid endarterectomy Remote left carotid endarterectomy with carotid Dopplers performed 10/03/13 revealing a widely patent endarterectomy site. We will continue to follow this on an annual basis.   HLD (hyperlipidemia) History of hyperlipidemia on simvastatin 10 mg a day followed by his PCP   Essential hypertension History of hypertension with blood pressure measured at 156/64. He is on amlodipine 10 mg. Continued current meds at current dosing   CAD - CFX DES 7/07, LAD DES 10/08, cath 11/11- medical Rx History of coronary artery disease status post stenting of his LAD and circumflex coronary arteries in the past with drug eluting stents. Recent Myoview stress test performed 08/08/14 showed a small inferolateral scar without ischemia. The patient needs a urologic procedure which she will be cleared for at low  cardiovascular perioperative risk.       Lorretta Harp MD FACP,FACC,FAHA, [facility] 09/24/2014 2:05 PM

## 2014-09-24 NOTE — Assessment & Plan Note (Signed)
History of coronary artery disease status post stenting of his LAD and circumflex coronary arteries in the past with drug eluting stents. Recent Myoview stress test performed 08/08/14 showed a small inferolateral scar without ischemia. The patient needs a urologic procedure which she will be cleared for at low cardiovascular perioperative risk.

## 2014-09-24 NOTE — Assessment & Plan Note (Signed)
History of pulmonary embolism in October of last year on Coumadin anticoagulation.

## 2014-09-24 NOTE — Patient Instructions (Addendum)
Please schedule an INR check with Erasmo Downer in 4 weeks - PLEASE SEND KRISTIN A STAFF MESSAGE TO LET HER KNOW WHEN PATIENT'S COUMADIN CHECK IS  We request that you follow-up in: 6 months with an extender and in 1 year with Dr Andria Rhein will receive a reminder letter in the mail two months in advance. If you don't receive a letter, please call our office to schedule the follow-up appointment.

## 2014-09-27 ENCOUNTER — Telehealth: Payer: Self-pay | Admitting: Pharmacist Clinician (PhC)/ Clinical Pharmacy Specialist

## 2014-09-27 NOTE — Telephone Encounter (Signed)
LMOM for patient - planning surgery at [facility] soon, will need lovenox bridge

## 2014-10-16 NOTE — Telephone Encounter (Signed)
Dtr [facility] that surgery has not been scheduled, pt has appt with [facility] on 4.27

## 2014-10-25 ENCOUNTER — Ambulatory Visit (INDEPENDENT_AMBULATORY_CARE_PROVIDER_SITE_OTHER): Payer: Medicare HMO | Admitting: Pharmacist Clinician (PhC)/ Clinical Pharmacy Specialist

## 2014-10-25 DIAGNOSIS — I2699 Other pulmonary embolism without acute cor pulmonale: Secondary | ICD-10-CM

## 2014-10-25 LAB — POCT INR: INR: 3

## 2014-11-04 ENCOUNTER — Telehealth: Payer: Self-pay | Admitting: Cardiovascular Disease

## 2014-11-04 NOTE — Telephone Encounter (Signed)
Spoke with daughter, advised that she or other family member come to his appointment on 5/13 for directions on lovenox bridging for surgery scheduled for 5/23.  Dtr voiced understanding, appt changed to later in the day so that she can come.

## 2014-11-04 NOTE — Telephone Encounter (Signed)
Dr. Odis Luster  Is wanting to speak with Dr. Gwenlyn Found about this pt

## 2014-11-04 NOTE — Telephone Encounter (Signed)
Mr Hoobler is scheduled for surgery on 12/02/14 with Dr Odis Luster.  He would like for [place] to bridge the patient with lovenox.  I will forward this to [place].

## 2014-11-22 ENCOUNTER — Ambulatory Visit: Payer: Medicare HMO | Admitting: Pharmacist Clinician (PhC)/ Clinical Pharmacy Specialist

## 2014-11-22 ENCOUNTER — Ambulatory Visit (INDEPENDENT_AMBULATORY_CARE_PROVIDER_SITE_OTHER): Payer: Medicare HMO | Admitting: Pharmacist Clinician (PhC)/ Clinical Pharmacy Specialist

## 2014-11-22 DIAGNOSIS — Z7901 Long term (current) use of anticoagulants: Secondary | ICD-10-CM

## 2014-11-22 DIAGNOSIS — I2699 Other pulmonary embolism without acute cor pulmonale: Secondary | ICD-10-CM | POA: Diagnosis not present

## 2014-11-22 LAB — POCT INR: INR: 3.3

## 2014-11-22 NOTE — Patient Instructions (Addendum)
Enoxaprin Dosing Schedule  Enoxparin dose: 80 mg  Date  Warfarin Dose (evenings) Enoxaprin Dose  5-17 6 1/2 tab (2.5 mg)   5-18 5 0   5-19 4 0 8am    8pm  5-20 3 0 8am    8pm  5-21 2 0 8am    8pm  5-22 1 0 8am  5-23 Procedure    5-24 1 1.5 tabs (7.5 mg)            8pm  5-25 2 1.5 tabs (7.5 mg) 8am    8pm  5-26 3 1.5 tabs (7.5 mg) 8am    8pm  5-27 4 Repeat INR  8am    5-28 5    5-29 6

## 2014-11-25 ENCOUNTER — Other Ambulatory Visit: Payer: Self-pay | Admitting: Pharmacist Clinician (PhC)/ Clinical Pharmacy Specialist

## 2014-11-25 MED ORDER — ENOXAPARIN SODIUM 80 MG/0.8ML ~~LOC~~ SOLN: SUBCUTANEOUS | Status: DC

## 2014-12-06 ENCOUNTER — Ambulatory Visit (INDEPENDENT_AMBULATORY_CARE_PROVIDER_SITE_OTHER): Payer: Medicare HMO | Admitting: Pharmacist Clinician (PhC)/ Clinical Pharmacy Specialist

## 2014-12-06 DIAGNOSIS — I2699 Other pulmonary embolism without acute cor pulmonale: Secondary | ICD-10-CM

## 2014-12-06 DIAGNOSIS — Z7901 Long term (current) use of anticoagulants: Secondary | ICD-10-CM | POA: Diagnosis not present

## 2014-12-06 LAB — POCT INR: INR: 1.4

## 2014-12-10 ENCOUNTER — Emergency Department (HOSPITAL_COMMUNITY)
Admission: EM | Admit: 2014-12-10 | Discharge: 2014-12-10 | Disposition: A | Discharge status: Home / Self Care | Payer: Medicare HMO | Attending: Emergency Medicine | Admitting: Emergency Medicine

## 2014-12-10 DIAGNOSIS — Z9861 Coronary angioplasty status: Secondary | ICD-10-CM | POA: Diagnosis not present

## 2014-12-10 DIAGNOSIS — E780 Pure hypercholesterolemia: Secondary | ICD-10-CM | POA: Insufficient documentation

## 2014-12-10 DIAGNOSIS — G4730 Sleep apnea, unspecified: Secondary | ICD-10-CM | POA: Insufficient documentation

## 2014-12-10 DIAGNOSIS — I2510 Atherosclerotic heart disease of native coronary artery without angina pectoris: Secondary | ICD-10-CM | POA: Diagnosis not present

## 2014-12-10 DIAGNOSIS — Z7901 Long term (current) use of anticoagulants: Secondary | ICD-10-CM | POA: Insufficient documentation

## 2014-12-10 DIAGNOSIS — I10 Essential (primary) hypertension: Secondary | ICD-10-CM | POA: Diagnosis not present

## 2014-12-10 DIAGNOSIS — K219 Gastro-esophageal reflux disease without esophagitis: Secondary | ICD-10-CM | POA: Diagnosis not present

## 2014-12-10 DIAGNOSIS — H9190 Unspecified hearing loss, unspecified ear: Secondary | ICD-10-CM | POA: Diagnosis not present

## 2014-12-10 DIAGNOSIS — Z7951 Long term (current) use of inhaled steroids: Secondary | ICD-10-CM | POA: Diagnosis not present

## 2014-12-10 DIAGNOSIS — Z9889 Other specified postprocedural states: Secondary | ICD-10-CM | POA: Insufficient documentation

## 2014-12-10 DIAGNOSIS — M1990 Unspecified osteoarthritis, unspecified site: Secondary | ICD-10-CM | POA: Diagnosis not present

## 2014-12-10 DIAGNOSIS — I739 Peripheral vascular disease, unspecified: Secondary | ICD-10-CM | POA: Insufficient documentation

## 2014-12-10 DIAGNOSIS — B029 Zoster without complications: Secondary | ICD-10-CM

## 2014-12-10 DIAGNOSIS — R220 Localized swelling, mass and lump, head: Secondary | ICD-10-CM | POA: Diagnosis present

## 2014-12-10 DIAGNOSIS — B0239 Other herpes zoster eye disease: Secondary | ICD-10-CM | POA: Insufficient documentation

## 2014-12-10 DIAGNOSIS — Z79899 Other long term (current) drug therapy: Secondary | ICD-10-CM | POA: Insufficient documentation

## 2014-12-10 DIAGNOSIS — Z85828 Personal history of other malignant neoplasm of skin: Secondary | ICD-10-CM | POA: Diagnosis not present

## 2014-12-10 DIAGNOSIS — K5900 Constipation, unspecified: Secondary | ICD-10-CM | POA: Insufficient documentation

## 2014-12-10 DIAGNOSIS — Z87891 Personal history of nicotine dependence: Secondary | ICD-10-CM | POA: Diagnosis not present

## 2014-12-10 DIAGNOSIS — Z7982 Long term (current) use of aspirin: Secondary | ICD-10-CM | POA: Insufficient documentation

## 2014-12-10 MED ORDER — FLUORESCEIN SODIUM 1 MG OP STRP
1.0000 | ORAL_STRIP | Freq: Once | OPHTHALMIC | Status: AC
  Administered 2014-12-10: 1 via OPHTHALMIC
  Filled 2014-12-10: qty 1

## 2014-12-10 MED ORDER — VALACYCLOVIR HCL 500 MG PO TABS: 500.0000 mg | ORAL_TABLET | Freq: Two times a day (BID) | ORAL | Status: DC

## 2014-12-10 MED ORDER — VALACYCLOVIR HCL 500 MG PO TABS
500.0000 mg | ORAL_TABLET | Freq: Two times a day (BID) | ORAL | Status: DC
  Administered 2014-12-10: 500 mg via ORAL
  Filled 2014-12-10 (×2): qty 1

## 2014-12-10 NOTE — Discharge Instructions (Signed)

## 2014-12-10 NOTE — ED Notes (Signed)
To ED via [facility] from home with c/o rash and swelling over left eye with drainage. States started last week with c-pap. Pt is alert/oriented x 4, lives with daughter but is alone in the daytime. Has swollen area and open rash over left eye. Left eye has drainage.

## 2014-12-10 NOTE — ED Notes (Signed)
Pt is in stable condition upon d/c and is escorted from ED via wheelchair. 

## 2014-12-10 NOTE — ED Provider Notes (Signed)
CSN: [id number]     Arrival date & time 12/10/14  1401 History   First MD Initiated Contact with Patient 12/10/14 1402     Chief Complaint  Patient presents with  . Herpes Zoster  . Facial Swelling     HPI  She presents for evaluation of left facial rash. Was seen by primary care physician with severe pain and told it might be just an allergy. Seen 2 days ago at urgent care with some left frontal facial pain placed on anabolic for "sinus infection". Has had redness and some blistering to his left upper face since that time and presents here.  Past Medical History  Diagnosis Date  . Hypertension   . Acid reflux   . High cholesterol   . Coronary artery disease   . Arthritis   . Sleep apnea     CPAP  . Peripheral vascular disease     Remote left carotid endarterectomy  . Cancer hx of skin cancer  . HOH (hard of hearing)     wears hearing aids, but still can not hear  . Incontinence of urine   . Constipation    Past Surgical History  Procedure Laterality Date  . Rotator cuff repair    . Prostate surgery    . Laparotomy    . Coronary stent placement    . Nasal sinus surgery    . Anal fissure repair    . Eye surgery      cataracts  . Irrigation and debridement abscess    . I&d extremity  06/28/2012    Procedure: IRRIGATION AND DEBRIDEMENT EXTREMITY;  Surgeon: Newt Minion, MD;  Location: [facility];  Service: Orthopedics;  Laterality: Right;  Debridement Wound Right Leg, VAC, Antibiotic Beads, Apply A-Cell  . 2-d echocardiogram  03/07/2008    Ejection fraction greater than 55%. Mild concentric left ventricular hypertrophy.LV relaxation. mildly dilated left atrium. Mild mitral regurgitation.   . Cardiac catheterization      X 2 stents  . Colonoscopy w/ polypectomy    . Colon surgery      removed part of the small intestine  . Lumbar laminectomy N/A 02/08/2014    Procedure: L3-4, L4-5 Decompression;  Surgeon: Marybelle Killings, MD;  Location: [facility];  Service: Orthopedics;   Laterality: N/A;  L3-4, L4-5 Decompression   Family History  Problem Relation Age of Onset  . Hypertension Mother    History  Substance Use Topics  . Smoking status: Former Smoker    Types: Cigarettes    Quit date: 07/12/1978  . Smokeless tobacco: Never Used  . Alcohol Use: No    Review of Systems  Constitutional: Negative for fever, chills, diaphoresis, appetite change and fatigue.  HENT: Negative for mouth sores, sore throat and trouble swallowing.   Eyes: Positive for discharge and redness. Negative for visual disturbance.       Redness the left upper for head with some ruptured blisters on the forehead and upper lid  Respiratory: Negative for cough, chest tightness, shortness of breath and wheezing.   Cardiovascular: Negative for chest pain.  Gastrointestinal: Negative for nausea, vomiting, abdominal pain, diarrhea and abdominal distention.  Endocrine: Negative for polydipsia, polyphagia and polyuria.  Genitourinary: Negative for dysuria, frequency and hematuria.  Musculoskeletal: Negative for gait problem.  Skin: Negative for color change, pallor and rash.  Neurological: Negative for dizziness, syncope, light-headedness and headaches.  Hematological: Does not bruise/bleed easily.  Psychiatric/Behavioral: Negative for behavioral problems and confusion.  Allergies  Ciprofloxacin and Dapsone  Home Medications   Prior to Admission medications   Medication Sig Start Date End Date Taking? Authorizing Provider  amLODipine (NORVASC) 10 MG tablet Take 10 mg by mouth daily.    Historical Provider, MD  Ascorbic Acid (VITAMIN C) 1000 MG tablet Take 1,000 mg by mouth daily.    Historical Provider, MD  aspirin EC 81 MG tablet Take 81 mg by mouth daily.    Historical Provider, MD  Calcium Carbonate-Vitamin D (CALCIUM + D PO) Take 1 tablet by mouth daily.    Historical Provider, MD  clopidogrel (PLAVIX) 75 MG tablet Take 1 tablet (75 mg total) by mouth daily. 02/12/14   Lorretta Harp, MD  diphenhydramine-acetaminophen (TYLENOL PM) 25-500 MG TABS Take 1 tablet by mouth at bedtime as needed. For pain    Historical Provider, MD  enoxaparin (LOVENOX) 80 MG/0.8ML injection Inject 1 syringe (80 mg) every 12 hours as directed 11/25/14   Lorretta Harp, MD  ferrous sulfate 325 (65 FE) MG tablet Take 325 mg by mouth daily with breakfast.    Historical Provider, MD  fluticasone (FLONASE) 50 MCG/ACT nasal spray Place 1 spray into both nostrils daily as needed for allergies.     Historical Provider, MD  Loratadine 10 MG CAPS Take 1 tablet daily by mouth, as needed for allergies. 08/01/14   Historical Provider, MD  montelukast (SINGULAIR) 10 MG tablet Take 10 mg by mouth at bedtime.    Historical Provider, MD  Multiple Vitamin (MULTIVITAMIN WITH MINERALS) TABS Take 1 tablet by mouth every morning.     Historical Provider, MD  nitroGLYCERIN (NITROSTAT) 0.4 MG SL tablet Place 1 tablet (0.4 mg total) under the tongue every 5 (five) minutes as needed for chest pain. 08/08/13   Lorretta Harp, MD  Omega-3 Fatty Acids (FISH OIL) 1200 MG CAPS Take 1,200 mg by mouth daily.     Historical Provider, MD  pantoprazole (PROTONIX) 40 MG tablet Take 1 tablet (40 mg total) by mouth daily. 02/12/14   Lorretta Harp, MD  polyethylene glycol [facility] / Floria Raveling) packet Take 17 g by mouth at bedtime.     Historical Provider, MD  PROVENTIL HFA 108 (90 BASE) MCG/ACT inhaler  08/01/14   Historical Provider, MD  Pyridoxine HCl (VITAMIN B6 PO) Take 100 mg by mouth daily.     Historical Provider, MD  simvastatin (ZOCOR) 10 MG tablet Take 1 tablet (10 mg total) by mouth at bedtime. 02/12/14   Lorretta Harp, MD  tolterodine (DETROL LA) 4 MG 24 hr capsule Take 4 mg by mouth daily. 03/12/13   Historical Provider, MD  traMADol (ULTRAM) 50 MG tablet Take by mouth every 6 (six) hours as needed.    Historical Provider, MD  valACYclovir (VALTREX) 500 MG tablet Take 1 tablet (500 mg total) by mouth 2 (two) times daily.  12/10/14   Tanna Furry, MD  warfarin (COUMADIN) 5 MG tablet Take 1 tablet by mouth daily or as directed by coumadin clinic 05/22/14   Lorretta Harp, MD   BP 179/63 mmHg  Pulse 66  Temp(Src) 98.7 F (37.1 C) (Oral)  Resp 18  SpO2 100% Physical Exam  Constitutional: He is oriented to person, place, and time. He appears well-developed and well-nourished. No distress.  HENT:  Head: Normocephalic.    Eyes: Conjunctivae are normal. Pupils are equal, round, and reactive to light. No scleral icterus.  Neck: Normal range of motion. Neck supple. No thyromegaly present.  Cardiovascular: Normal rate and regular rhythm.  Exam reveals no gallop and no friction rub.   No murmur heard. Pulmonary/Chest: Effort normal and breath sounds normal. No respiratory distress. He has no wheezes. He has no rales.  Abdominal: Soft. Bowel sounds are normal. He exhibits no distension. There is no tenderness. There is no rebound.  Musculoskeletal: Normal range of motion.  Neurological: He is alert and oriented to person, place, and time.  Skin: Skin is warm and dry. No rash noted.  Psychiatric: He has a normal mood and affect. His behavior is normal.    ED Course  Procedures (including critical care time) Labs Review Labs Reviewed - No data to display  Imaging Review No results found.   EKG Interpretation None      MDM   Final diagnoses:  Shingles    Given single-dose by mouth prednisone. Given low dose Valtrex with consideration for his age. Last examined to be aware of any sedative effects or confusion. Recheck with primary care if not improving.    Tanna Furry, MD 12/10/14 [phone]

## 2014-12-20 ENCOUNTER — Ambulatory Visit (INDEPENDENT_AMBULATORY_CARE_PROVIDER_SITE_OTHER): Payer: Medicare HMO | Admitting: Pharmacist Clinician (PhC)/ Clinical Pharmacy Specialist

## 2014-12-20 DIAGNOSIS — Z7901 Long term (current) use of anticoagulants: Secondary | ICD-10-CM | POA: Diagnosis not present

## 2014-12-20 DIAGNOSIS — I2699 Other pulmonary embolism without acute cor pulmonale: Secondary | ICD-10-CM

## 2014-12-20 LAB — POCT INR: INR: 3.7

## 2015-01-03 ENCOUNTER — Ambulatory Visit (INDEPENDENT_AMBULATORY_CARE_PROVIDER_SITE_OTHER): Payer: Medicare HMO | Admitting: Pharmacist

## 2015-01-03 DIAGNOSIS — I2699 Other pulmonary embolism without acute cor pulmonale: Secondary | ICD-10-CM

## 2015-01-03 DIAGNOSIS — Z7901 Long term (current) use of anticoagulants: Secondary | ICD-10-CM | POA: Diagnosis not present

## 2015-01-03 LAB — HM DIABETES EYE EXAM

## 2015-01-03 LAB — POCT INR: INR: 4.3

## 2015-01-09 ENCOUNTER — Encounter (HOSPITAL_COMMUNITY): Payer: Self-pay

## 2015-01-09 ENCOUNTER — Emergency Department (HOSPITAL_COMMUNITY)
Admission: EM | Admit: 2015-01-09 | Discharge: 2015-01-10 | Disposition: A | Discharge status: Home / Self Care | Payer: Medicare HMO | Attending: Emergency Medicine | Admitting: Emergency Medicine

## 2015-01-09 DIAGNOSIS — W19XXXA Unspecified fall, initial encounter: Secondary | ICD-10-CM

## 2015-01-09 DIAGNOSIS — S50311A Abrasion of right elbow, initial encounter: Secondary | ICD-10-CM | POA: Diagnosis not present

## 2015-01-09 DIAGNOSIS — Z7982 Long term (current) use of aspirin: Secondary | ICD-10-CM | POA: Insufficient documentation

## 2015-01-09 DIAGNOSIS — Y999 Unspecified external cause status: Secondary | ICD-10-CM | POA: Diagnosis not present

## 2015-01-09 DIAGNOSIS — Z7902 Long term (current) use of antithrombotics/antiplatelets: Secondary | ICD-10-CM | POA: Insufficient documentation

## 2015-01-09 DIAGNOSIS — L089 Local infection of the skin and subcutaneous tissue, unspecified: Secondary | ICD-10-CM | POA: Insufficient documentation

## 2015-01-09 DIAGNOSIS — M1990 Unspecified osteoarthritis, unspecified site: Secondary | ICD-10-CM | POA: Insufficient documentation

## 2015-01-09 DIAGNOSIS — Z7901 Long term (current) use of anticoagulants: Secondary | ICD-10-CM | POA: Diagnosis not present

## 2015-01-09 DIAGNOSIS — Z859 Personal history of malignant neoplasm, unspecified: Secondary | ICD-10-CM | POA: Insufficient documentation

## 2015-01-09 DIAGNOSIS — W1839XA Other fall on same level, initial encounter: Secondary | ICD-10-CM | POA: Insufficient documentation

## 2015-01-09 DIAGNOSIS — Z23 Encounter for immunization: Secondary | ICD-10-CM | POA: Diagnosis not present

## 2015-01-09 DIAGNOSIS — S5001XA Contusion of right elbow, initial encounter: Secondary | ICD-10-CM | POA: Diagnosis not present

## 2015-01-09 DIAGNOSIS — Z87891 Personal history of nicotine dependence: Secondary | ICD-10-CM | POA: Insufficient documentation

## 2015-01-09 DIAGNOSIS — G4730 Sleep apnea, unspecified: Secondary | ICD-10-CM | POA: Insufficient documentation

## 2015-01-09 DIAGNOSIS — H9190 Unspecified hearing loss, unspecified ear: Secondary | ICD-10-CM | POA: Diagnosis not present

## 2015-01-09 DIAGNOSIS — I10 Essential (primary) hypertension: Secondary | ICD-10-CM | POA: Insufficient documentation

## 2015-01-09 DIAGNOSIS — K219 Gastro-esophageal reflux disease without esophagitis: Secondary | ICD-10-CM | POA: Diagnosis not present

## 2015-01-09 DIAGNOSIS — K5900 Constipation, unspecified: Secondary | ICD-10-CM | POA: Insufficient documentation

## 2015-01-09 DIAGNOSIS — S59901A Unspecified injury of right elbow, initial encounter: Secondary | ICD-10-CM | POA: Diagnosis present

## 2015-01-09 DIAGNOSIS — I2510 Atherosclerotic heart disease of native coronary artery without angina pectoris: Secondary | ICD-10-CM | POA: Diagnosis not present

## 2015-01-09 DIAGNOSIS — Y9289 Other specified places as the place of occurrence of the external cause: Secondary | ICD-10-CM | POA: Insufficient documentation

## 2015-01-09 DIAGNOSIS — Z9981 Dependence on supplemental oxygen: Secondary | ICD-10-CM | POA: Diagnosis not present

## 2015-01-09 DIAGNOSIS — E780 Pure hypercholesterolemia: Secondary | ICD-10-CM | POA: Insufficient documentation

## 2015-01-09 DIAGNOSIS — Y939 Activity, unspecified: Secondary | ICD-10-CM | POA: Insufficient documentation

## 2015-01-09 DIAGNOSIS — Z79899 Other long term (current) drug therapy: Secondary | ICD-10-CM | POA: Insufficient documentation

## 2015-01-09 MED ORDER — TETANUS-DIPHTH-ACELL PERTUSSIS 5-2.5-18.5 LF-MCG/0.5 IM SUSP
0.5000 mL | Freq: Once | INTRAMUSCULAR | Status: AC
  Administered 2015-01-10: 0.5 mL via INTRAMUSCULAR
  Filled 2015-01-09: qty 0.5

## 2015-01-09 NOTE — ED Notes (Addendum)
PER EMS: pt from home, pt fell tonight outside and then he was able to get himself back inside. His daughter came to visit and noticed blood and found out that the patient had fallen so she called EMS. Unsure what time he fell. Pt reports right side rib pain, back pain but denies neck pain. Poor historian. Bruising to right middle abdomen and three small skin tears to right elbow. Pt on warfarin. A&Ox4. C-collar and KED in place.

## 2015-01-09 NOTE — ED Provider Notes (Signed)
CSN: [id number]     Arrival date & time 01/09/15  2144 History   First MD Initiated Contact with Patient 01/09/15 2155     Chief Complaint  Patient presents with  . Fall     (Consider location/radiation/quality/duration/timing/severity/associated sxs/prior Treatment) The history is provided by the patient and a relative.     Larry Huffman is a 81 y.o. male who is here for evaluation of injury from fall. He fell along the outside. He is not sure why he fell. He apparently made it back into his home, and when his daughter home later decided to bring him here for evaluation. The only injury, which has been noted is the right elbow. He is otherwise acting normal. There've been no other recent problems such as fever, chills, nausea, vomiting, cough, shortness of breath or chest pain. He is here with his daughter gives the majority of the history. There are no other known modifying factors.   Past Medical History  Diagnosis Date  . Hypertension   . Acid reflux   . High cholesterol   . Coronary artery disease   . Arthritis   . Sleep apnea     CPAP  . Peripheral vascular disease     Remote left carotid endarterectomy  . Cancer hx of skin cancer  . HOH (hard of hearing)     wears hearing aids, but still can not hear  . Incontinence of urine   . Constipation    Past Surgical History  Procedure Laterality Date  . Rotator cuff repair    . Prostate surgery    . Laparotomy    . Coronary stent placement    . Nasal sinus surgery    . Anal fissure repair    . Eye surgery      cataracts  . Irrigation and debridement abscess    . I&d extremity  06/28/2012    Procedure: IRRIGATION AND DEBRIDEMENT EXTREMITY;  Surgeon: Newt Minion, MD;  Location: [facility];  Service: Orthopedics;  Laterality: Right;  Debridement Wound Right Leg, VAC, Antibiotic Beads, Apply A-Cell  . 2-d echocardiogram  03/07/2008    Ejection fraction greater than 55%. Mild concentric left ventricular hypertrophy.LV relaxation.  mildly dilated left atrium. Mild mitral regurgitation.   . Cardiac catheterization      X 2 stents  . Colonoscopy w/ polypectomy    . Colon surgery      removed part of the small intestine  . Lumbar laminectomy N/A 02/08/2014    Procedure: L3-4, L4-5 Decompression;  Surgeon: Marybelle Killings, MD;  Location: [facility];  Service: Orthopedics;  Laterality: N/A;  L3-4, L4-5 Decompression   Family History  Problem Relation Age of Onset  . Hypertension Mother    History  Substance Use Topics  . Smoking status: Former Smoker    Types: Cigarettes    Quit date: 07/12/1978  . Smokeless tobacco: Never Used  . Alcohol Use: No    Review of Systems  All other systems reviewed and are negative.     Allergies  Ciprofloxacin and Dapsone  Home Medications   Prior to Admission medications   Medication Sig Start Date End Date Taking? Authorizing Provider  amLODipine (NORVASC) 10 MG tablet Take 10 mg by mouth daily.   Yes Historical Provider, MD  aspirin EC 81 MG tablet Take 81 mg by mouth daily.   Yes Historical Provider, MD  Calcium Carbonate-Vitamin D (CALCIUM + D PO) Take 1 tablet by mouth daily.   Yes  Historical Provider, MD  clopidogrel (PLAVIX) 75 MG tablet Take 1 tablet (75 mg total) by mouth daily. 02/12/14  Yes Lorretta Harp, MD  diphenhydramine-acetaminophen (TYLENOL PM) 25-500 MG TABS Take 1 tablet by mouth at bedtime as needed. For pain   Yes Historical Provider, MD  ferrous sulfate 325 (65 FE) MG tablet Take 325 mg by mouth daily with breakfast.   Yes Historical Provider, MD  montelukast (SINGULAIR) 10 MG tablet Take 10 mg by mouth at bedtime.   Yes Historical Provider, MD  Multiple Vitamin (MULTIVITAMIN WITH MINERALS) TABS Take 1 tablet by mouth every morning.    Yes Historical Provider, MD  nitroGLYCERIN (NITROSTAT) 0.4 MG SL tablet Place 1 tablet (0.4 mg total) under the tongue every 5 (five) minutes as needed for chest pain. 08/08/13  Yes Lorretta Harp, MD  Omega-3 Fatty Acids  (FISH OIL) 1200 MG CAPS Take 1,200 mg by mouth daily.    Yes Historical Provider, MD  pantoprazole (PROTONIX) 40 MG tablet Take 1 tablet (40 mg total) by mouth daily. 02/12/14  Yes Lorretta Harp, MD  polyethylene glycol [facility] / GLYCOLAX) packet Take 17 g by mouth at bedtime.    Yes Historical Provider, MD  PROVENTIL HFA 108 (90 BASE) MCG/ACT inhaler Inhale 1-2 puffs into the lungs every 6 (six) hours as needed for wheezing or shortness of breath.  08/01/14  Yes Historical Provider, MD  Pyridoxine HCl (VITAMIN B6 PO) Take 100 mg by mouth daily.    Yes Historical Provider, MD  simvastatin (ZOCOR) 10 MG tablet Take 1 tablet (10 mg total) by mouth at bedtime. 02/12/14  Yes Lorretta Harp, MD  sulfamethoxazole-trimethoprim (BACTRIM DS,SEPTRA DS) 800-160 MG per tablet Take 1 tablet by mouth 2 (two) times daily. 12/27/14  Yes Historical Provider, MD  tolterodine (DETROL LA) 4 MG 24 hr capsule Take 4 mg by mouth daily. 03/12/13  Yes Historical Provider, MD  traMADol (ULTRAM) 50 MG tablet Take by mouth every 6 (six) hours as needed.   Yes Historical Provider, MD  valACYclovir (VALTREX) 500 MG tablet Take 1 tablet (500 mg total) by mouth 2 (two) times daily. 12/10/14  Yes Tanna Furry, MD  warfarin (COUMADIN) 5 MG tablet Take 1 tablet by mouth daily or as directed by coumadin clinic Patient taking differently: Take 2.5-5 mg by mouth daily. Take 2.5 on Wed / Sun Take 5 mg on ALL other days 05/22/14  Yes Lorretta Harp, MD   BP 136/54 mmHg  Pulse 71  Resp 18  SpO2 96% Physical Exam  Constitutional: He is oriented to person, place, and time. He appears well-developed. No distress.  Elderly, frail  HENT:  Head: Normocephalic and atraumatic.  Right Ear: External ear normal.  Left Ear: External ear normal.  Eyes: Conjunctivae and EOM are normal. Pupils are equal, round, and reactive to light.  Neck: Normal range of motion and phonation normal. Neck supple.  Cardiovascular: Normal rate, regular rhythm and  normal heart sounds.   Pulmonary/Chest: Effort normal and breath sounds normal. He exhibits tenderness (Mild tenderness of right rib #12 without associated crepitation or deformity. There is no bruising in this area.). He exhibits no bony tenderness.  Abdominal: Soft. There is no tenderness.  Musculoskeletal: Normal range of motion.  Normal range of motion, arms and legs bilaterally  Neurological: He is alert and oriented to person, place, and time. No cranial nerve deficit or sensory deficit. He exhibits normal muscle tone. Coordination normal.  Skin: Skin is warm, dry  and intact.  Abrasions right posterior elbow.  Psychiatric: He has a normal mood and affect. His behavior is normal.  Nursing note and vitals reviewed.   ED Course  Procedures (including critical care time) Medications  Tdap (BOOSTRIX) injection 0.5 mL (not administered)    Patient Vitals for the past 24 hrs:  BP Pulse Resp SpO2  01/09/15 2315 (!) 136/54 mmHg 71 - 96 %  01/09/15 2300 129/56 mmHg 73 - 96 %  01/09/15 2245 142/61 mmHg 72 - 97 %  01/09/15 2230 132/64 mmHg 73 - 98 %  01/09/15 2215 143/60 mmHg 70 - 99 %  01/09/15 2154 149/59 mmHg 75 18 100 %    12:15 AM Reevaluation with update and discussion. After initial assessment and treatment, an updated evaluation reveals his wife states he is doing better now. He is comfortable. Findings discussed with patient and wife, all questions answered.Daleen Bo L     Labs Review Labs Reviewed  URINE CULTURE  URINALYSIS, ROUTINE W REFLEX MICROSCOPIC (NOT AT [facility])    Imaging Review No results found.   EKG Interpretation None      MDM   Final diagnoses:  Fall, initial encounter  Abrasion, elbow with infection, right, initial encounter  Contusion, elbow, right, initial encounter    Fall, without serious injuries. Doubt fracture, metabolic instability or serious bacterial infection.  Nursing Notes Reviewed/ Care Coordinated, and agree without  changes. Applicable Imaging Reviewed.  Interpretation of Laboratory Data incorporated into ED treatment  Plan: Anticipate disposition home with wound care, and follow-up with PCP in one week if no complicated issues found on urinalysis.  Care to Dr. Roxanne Mins- 45:40    Daleen Bo, MD 01/11/15 [phone]

## 2015-01-10 LAB — URINALYSIS, ROUTINE W REFLEX MICROSCOPIC
GLUCOSE, UA: NEGATIVE mg/dL
HGB URINE DIPSTICK: NEGATIVE
Ketones, ur: NEGATIVE mg/dL
Leukocytes, UA: NEGATIVE
Nitrite: NEGATIVE
PROTEIN: 30 mg/dL — AB
Specific Gravity, Urine: 1.022 (ref 1.005–1.030)
Urobilinogen, UA: 0.2 mg/dL (ref 0.0–1.0)
pH: 6 (ref 5.0–8.0)

## 2015-01-10 LAB — URINE MICROSCOPIC-ADD ON

## 2015-01-10 MED ORDER — ONDANSETRON 4 MG PO TBDP
ORAL_TABLET | ORAL | Status: AC
  Filled 2015-01-10: qty 2

## 2015-01-10 MED ORDER — ONDANSETRON 4 MG PO TBDP
8.0000 mg | ORAL_TABLET | Freq: Once | ORAL | Status: AC
  Administered 2015-01-10: 8 mg via ORAL

## 2015-01-10 MED ORDER — "THROMBI-PAD 3""X3"" EX PADS"
1.0000 | MEDICATED_PAD | Freq: Once | CUTANEOUS | Status: AC
  Administered 2015-01-10: 1 via TOPICAL
  Filled 2015-01-10: qty 3

## 2015-01-10 NOTE — ED Provider Notes (Signed)
Patient initially seen and evaluated by Dr. Eulis Foster having fallen at home and came in with abrasions of his right elbow. He was signed out to me to evaluate results of urinalysis to major he did not have an occult urinary tract infection. Urinalysis is come back showing no signs of infection. While waiting for urinalysis to come back, is abrasions had bled through the dressing. I remove the dressing and found very slow oozing. It is noted that he is on warfarin. Thrombin pad was applied to the wounds and they were redressed. He is discharged with instructions to follow-up with PCP.  Delora Fuel, MD 54/56/25 [id number]

## 2015-01-10 NOTE — Discharge Instructions (Signed)
Contusion °A contusion is a deep bruise. Contusions are the result of an injury that caused bleeding under the skin. The contusion may turn blue, purple, or yellow. Minor injuries will give you a painless contusion, but more severe contusions may stay painful and swollen for a few weeks.  °CAUSES  °A contusion is usually caused by a blow, trauma, or direct force to an area of the body. °SYMPTOMS  °· Swelling and redness of the injured area. °· Bruising of the injured area. °· Tenderness and soreness of the injured area. °· Pain. °DIAGNOSIS  °The diagnosis can be made by taking a history and physical exam. An X-ray, CT scan, or MRI may be needed to determine if there were any associated injuries, such as fractures. °TREATMENT  °Specific treatment will depend on what area of the body was injured. In general, the best treatment for a contusion is resting, icing, elevating, and applying cold compresses to the injured area. Over-the-counter medicines may also be recommended for pain control. Ask your caregiver what the best treatment is for your contusion. °HOME CARE INSTRUCTIONS  °· Put ice on the injured area. °· Put ice in a plastic bag. °· Place a towel between your skin and the bag. °· Leave the ice on for 15-20 minutes, 3-4 times a day, or as directed by your health care provider. °· Only take over-the-counter or prescription medicines for pain, discomfort, or fever as directed by your caregiver. Your caregiver may recommend avoiding anti-inflammatory medicines (aspirin, ibuprofen, and naproxen) for 48 hours because these medicines may increase bruising. °· Rest the injured area. °· If possible, elevate the injured area to reduce swelling. °SEEK IMMEDIATE MEDICAL CARE IF:  °· You have increased bruising or swelling. °· You have pain that is getting worse. °· Your swelling or pain is not relieved with medicines. °MAKE SURE YOU:  °· Understand these instructions. °· Will watch your condition. °· Will get help right  away if you are not doing well or get worse. °Document Released: 04/07/2005 Document Revised: 07/03/2013 Document Reviewed: 05/03/2011 °Exit[employer]® Patient Information ©2015 [employer]. This information is not intended to replace advice given to you by your health care provider. Make sure you discuss any questions you have with your health care provider. ° °Abrasion °An abrasion is a cut or scrape of the skin. Abrasions do not extend through all layers of the skin and most heal within 10 days. It is important to care for your abrasion properly to prevent infection. °CAUSES  °Most abrasions are caused by falling on, or gliding across, the ground or other surface. When your skin rubs on something, the outer and inner layer of skin rubs off, causing an abrasion. °DIAGNOSIS  °Your caregiver will be able to diagnose an abrasion during a physical exam.  °TREATMENT  °Your treatment depends on how large and deep the abrasion is. Generally, your abrasion will be cleaned with water and a mild soap to remove any dirt or debris. An antibiotic ointment may be put over the abrasion to prevent an infection. A bandage (dressing) may be wrapped around the abrasion to keep it from getting dirty.  °You may need a tetanus shot if: °· You cannot remember when you had your last tetanus shot. °· You have never had a tetanus shot. °· The injury broke your skin. °If you get a tetanus shot, your arm may swell, get red, and feel warm to the touch. This is common and not a problem. If you need a   tetanus shot and you choose not to have one, there is a rare chance of getting tetanus. Sickness from tetanus can be serious.  °HOME CARE INSTRUCTIONS  °· If a dressing was applied, change it at least once a day or as directed by your caregiver. If the bandage sticks, soak it off with warm water.   °· Wash the area with water and a mild soap to remove all the ointment 2 times a day. Rinse off the soap and pat the area dry with a clean towel.    °· Reapply any ointment as directed by your caregiver. This will help prevent infection and keep the bandage from sticking. Use gauze over the wound and under the dressing to help keep the bandage from sticking.   °· Change your dressing right away if it becomes wet or dirty.   °· Only take over-the-counter or prescription medicines for pain, discomfort, or fever as directed by your caregiver.   °· Follow up with your caregiver within 24-48 hours for a wound check, or as directed. If you were not given a wound-check appointment, look closely at your abrasion for redness, swelling, or pus. These are signs of infection. °SEEK IMMEDIATE MEDICAL CARE IF:  °· You have increasing pain in the wound.   °· You have redness, swelling, or tenderness around the wound.   °· You have pus coming from the wound.   °· You have a fever or persistent symptoms for more than 2-3 days. °· You have a fever and your symptoms suddenly get worse. °· You have a bad smell coming from the wound or dressing.   °MAKE SURE YOU:  °· Understand these instructions. °· Will watch your condition. °· Will get help right away if you are not doing well or get worse. °Document Released: 04/07/2005 Document Revised: 06/14/2012 Document Reviewed: 06/01/2011 °ExitCare® Patient Information ©2015 [employer]. This information is not intended to replace advice given to you by your health care provider. Make sure you discuss any questions you have with your health care provider. ° °

## 2015-01-10 NOTE — ED Notes (Signed)
Upon d/c pt began to feel nauseous. Dr Roxanne Mins informed and 8mg  zofran ODT to be ordered.

## 2015-01-12 LAB — URINE CULTURE: Special Requests: NORMAL

## 2015-01-14 ENCOUNTER — Telehealth (HOSPITAL_COMMUNITY): Payer: Self-pay

## 2015-01-14 NOTE — Telephone Encounter (Signed)
Post ED Visit - Positive Culture Follow-up  Culture report reviewed by antimicrobial stewardship pharmacist: []  Wes [place], Pharm.D., BCPS [x]  Heide Guile, Pharm.D., BCPS []  Alycia Rossetti, Pharm.D., BCPS []  [place], Pharm.D., BCPS, AAHIVP []  Legrand Como, Pharm.D., BCPS, AAHIVP []  Isac Sarna, Pharm.D., BCPS  Positive Urine culture, 20,000 colonies -> Enterococcus Species Treated with Sulfa Trimeth(on PTA) Chart reviewed by R. [place] PA "No treatment"  Dortha Kern 01/14/2015, 6:27 AM

## 2015-01-15 ENCOUNTER — Emergency Department (HOSPITAL_COMMUNITY): Payer: Medicare HMO

## 2015-01-15 ENCOUNTER — Encounter (HOSPITAL_COMMUNITY): Payer: Self-pay | Admitting: *Deleted

## 2015-01-15 ENCOUNTER — Inpatient Hospital Stay (HOSPITAL_COMMUNITY)
Admission: EM | Admit: 2015-01-15 | Discharge: 2015-01-21 | DRG: 377 | Disposition: A | Discharge status: Home Health | Payer: Medicare HMO | Attending: Internal Medicine | Admitting: Internal Medicine

## 2015-01-15 DIAGNOSIS — Z7901 Long term (current) use of anticoagulants: Secondary | ICD-10-CM

## 2015-01-15 DIAGNOSIS — E872 Acidosis: Secondary | ICD-10-CM | POA: Diagnosis present

## 2015-01-15 DIAGNOSIS — Z7982 Long term (current) use of aspirin: Secondary | ICD-10-CM

## 2015-01-15 DIAGNOSIS — D72829 Elevated white blood cell count, unspecified: Secondary | ICD-10-CM | POA: Diagnosis present

## 2015-01-15 DIAGNOSIS — I739 Peripheral vascular disease, unspecified: Secondary | ICD-10-CM | POA: Diagnosis present

## 2015-01-15 DIAGNOSIS — G4733 Obstructive sleep apnea (adult) (pediatric): Secondary | ICD-10-CM | POA: Diagnosis present

## 2015-01-15 DIAGNOSIS — Z8249 Family history of ischemic heart disease and other diseases of the circulatory system: Secondary | ICD-10-CM | POA: Diagnosis not present

## 2015-01-15 DIAGNOSIS — Z87891 Personal history of nicotine dependence: Secondary | ICD-10-CM | POA: Diagnosis not present

## 2015-01-15 DIAGNOSIS — Z955 Presence of coronary angioplasty implant and graft: Secondary | ICD-10-CM | POA: Diagnosis not present

## 2015-01-15 DIAGNOSIS — M1990 Unspecified osteoarthritis, unspecified site: Secondary | ICD-10-CM | POA: Diagnosis present

## 2015-01-15 DIAGNOSIS — D689 Coagulation defect, unspecified: Secondary | ICD-10-CM | POA: Diagnosis not present

## 2015-01-15 DIAGNOSIS — Z86718 Personal history of other venous thrombosis and embolism: Secondary | ICD-10-CM | POA: Diagnosis not present

## 2015-01-15 DIAGNOSIS — R32 Unspecified urinary incontinence: Secondary | ICD-10-CM | POA: Diagnosis present

## 2015-01-15 DIAGNOSIS — D62 Acute posthemorrhagic anemia: Secondary | ICD-10-CM | POA: Diagnosis present

## 2015-01-15 DIAGNOSIS — I2699 Other pulmonary embolism without acute cor pulmonale: Secondary | ICD-10-CM | POA: Diagnosis present

## 2015-01-15 DIAGNOSIS — I129 Hypertensive chronic kidney disease with stage 1 through stage 4 chronic kidney disease, or unspecified chronic kidney disease: Secondary | ICD-10-CM | POA: Diagnosis present

## 2015-01-15 DIAGNOSIS — Z86010 Personal history of colonic polyps: Secondary | ICD-10-CM | POA: Diagnosis not present

## 2015-01-15 DIAGNOSIS — Z7902 Long term (current) use of antithrombotics/antiplatelets: Secondary | ICD-10-CM | POA: Diagnosis not present

## 2015-01-15 DIAGNOSIS — T45515A Adverse effect of anticoagulants, initial encounter: Secondary | ICD-10-CM | POA: Diagnosis present

## 2015-01-15 DIAGNOSIS — E780 Pure hypercholesterolemia: Secondary | ICD-10-CM | POA: Diagnosis present

## 2015-01-15 DIAGNOSIS — K219 Gastro-esophageal reflux disease without esophagitis: Secondary | ICD-10-CM | POA: Diagnosis present

## 2015-01-15 DIAGNOSIS — H9190 Unspecified hearing loss, unspecified ear: Secondary | ICD-10-CM | POA: Diagnosis present

## 2015-01-15 DIAGNOSIS — Z66 Do not resuscitate: Secondary | ICD-10-CM | POA: Diagnosis present

## 2015-01-15 DIAGNOSIS — N183 Chronic kidney disease, stage 3 unspecified: Secondary | ICD-10-CM | POA: Diagnosis present

## 2015-01-15 DIAGNOSIS — E440 Moderate protein-calorie malnutrition: Secondary | ICD-10-CM | POA: Diagnosis present

## 2015-01-15 DIAGNOSIS — Z86711 Personal history of pulmonary embolism: Secondary | ICD-10-CM | POA: Diagnosis not present

## 2015-01-15 DIAGNOSIS — G9340 Encephalopathy, unspecified: Secondary | ICD-10-CM | POA: Diagnosis present

## 2015-01-15 DIAGNOSIS — K921 Melena: Secondary | ICD-10-CM | POA: Diagnosis present

## 2015-01-15 DIAGNOSIS — N179 Acute kidney failure, unspecified: Secondary | ICD-10-CM | POA: Diagnosis present

## 2015-01-15 DIAGNOSIS — R627 Adult failure to thrive: Secondary | ICD-10-CM | POA: Diagnosis present

## 2015-01-15 DIAGNOSIS — Z85828 Personal history of other malignant neoplasm of skin: Secondary | ICD-10-CM | POA: Diagnosis not present

## 2015-01-15 DIAGNOSIS — W19XXXA Unspecified fall, initial encounter: Secondary | ICD-10-CM

## 2015-01-15 DIAGNOSIS — K5521 Angiodysplasia of colon with hemorrhage: Principal | ICD-10-CM | POA: Diagnosis present

## 2015-01-15 DIAGNOSIS — Z792 Long term (current) use of antibiotics: Secondary | ICD-10-CM | POA: Diagnosis not present

## 2015-01-15 DIAGNOSIS — I2510 Atherosclerotic heart disease of native coronary artery without angina pectoris: Secondary | ICD-10-CM | POA: Diagnosis present

## 2015-01-15 DIAGNOSIS — Z7952 Long term (current) use of systemic steroids: Secondary | ICD-10-CM | POA: Diagnosis not present

## 2015-01-15 DIAGNOSIS — D699 Hemorrhagic condition, unspecified: Secondary | ICD-10-CM | POA: Diagnosis not present

## 2015-01-15 DIAGNOSIS — D649 Anemia, unspecified: Secondary | ICD-10-CM | POA: Diagnosis present

## 2015-01-15 DIAGNOSIS — K922 Gastrointestinal hemorrhage, unspecified: Secondary | ICD-10-CM | POA: Diagnosis present

## 2015-01-15 DIAGNOSIS — I340 Nonrheumatic mitral (valve) insufficiency: Secondary | ICD-10-CM | POA: Diagnosis present

## 2015-01-15 DIAGNOSIS — D6832 Hemorrhagic disorder due to extrinsic circulating anticoagulants: Secondary | ICD-10-CM | POA: Diagnosis present

## 2015-01-15 LAB — ABO/RH: ABO/RH(D): O POS

## 2015-01-15 LAB — CBC
HCT: 18.9 % — ABNORMAL LOW (ref 39.0–52.0)
Hemoglobin: 6.3 g/dL — CL (ref 13.0–17.0)
MCH: 32 pg (ref 26.0–34.0)
MCHC: 33.3 g/dL (ref 30.0–36.0)
MCV: 95.9 fL (ref 78.0–100.0)
Platelets: 296 10*3/uL (ref 150–400)
RBC: 1.97 MIL/uL — ABNORMAL LOW (ref 4.22–5.81)
RDW: 20.4 % — AB (ref 11.5–15.5)
WBC: 15.3 10*3/uL — ABNORMAL HIGH (ref 4.0–10.5)

## 2015-01-15 LAB — COMPREHENSIVE METABOLIC PANEL
ALT: 19 U/L (ref 17–63)
ANION GAP: 10 (ref 5–15)
AST: 31 U/L (ref 15–41)
Albumin: 2.8 g/dL — ABNORMAL LOW (ref 3.5–5.0)
Alkaline Phosphatase: 58 U/L (ref 38–126)
BUN: 53 mg/dL — AB (ref 6–20)
CALCIUM: 8.3 mg/dL — AB (ref 8.9–10.3)
CHLORIDE: 109 mmol/L (ref 101–111)
CO2: 16 mmol/L — AB (ref 22–32)
CREATININE: 1.49 mg/dL — AB (ref 0.61–1.24)
GFR calc Af Amer: 46 mL/min — ABNORMAL LOW (ref >60)
GFR, EST NON AFRICAN AMERICAN: 40 mL/min — AB (ref >60)
Glucose, Bld: 170 mg/dL — ABNORMAL HIGH (ref 65–99)
Potassium: 4.5 mmol/L (ref 3.5–5.1)
Sodium: 135 mmol/L (ref 135–145)
Total Bilirubin: 0.6 mg/dL (ref 0.3–1.2)
Total Protein: 6.3 g/dL — ABNORMAL LOW (ref 6.5–8.1)

## 2015-01-15 LAB — PROTIME-INR
INR: 3.14 — ABNORMAL HIGH (ref 0.00–1.49)
Prothrombin Time: 31.6 seconds — ABNORMAL HIGH (ref 11.6–15.2)

## 2015-01-15 LAB — PREPARE RBC (CROSSMATCH)

## 2015-01-15 LAB — MRSA PCR SCREENING: MRSA BY PCR: NEGATIVE

## 2015-01-15 LAB — POC OCCULT BLOOD, ED: Fecal Occult Bld: POSITIVE — AB

## 2015-01-15 MED ORDER — ONDANSETRON HCL 4 MG PO TABS
4.0000 mg | ORAL_TABLET | Freq: Four times a day (QID) | ORAL | Status: DC | PRN
  Administered 2015-01-19: 4 mg via ORAL
  Filled 2015-01-15 (×2): qty 1

## 2015-01-15 MED ORDER — PANTOPRAZOLE SODIUM 40 MG IV SOLR
80.0000 mg | Freq: Once | INTRAVENOUS | Status: AC
  Administered 2015-01-15: 80 mg via INTRAVENOUS
  Filled 2015-01-15 (×2): qty 80

## 2015-01-15 MED ORDER — VALACYCLOVIR HCL 500 MG PO TABS
500.0000 mg | ORAL_TABLET | Freq: Two times a day (BID) | ORAL | Status: DC
Start: 2015-01-15 — End: 2015-01-21
  Administered 2015-01-16 – 2015-01-21 (×12): 500 mg via ORAL
  Filled 2015-01-15 (×14): qty 1

## 2015-01-15 MED ORDER — DIPHENHYDRAMINE-APAP (SLEEP) 25-500 MG PO TABS: 1.0000 | ORAL_TABLET | Freq: Every evening | ORAL | Status: DC | PRN

## 2015-01-15 MED ORDER — ACETAMINOPHEN 325 MG PO TABS
650.0000 mg | ORAL_TABLET | Freq: Four times a day (QID) | ORAL | Status: DC | PRN
  Administered 2015-01-16: 650 mg via ORAL
  Filled 2015-01-15: qty 2

## 2015-01-15 MED ORDER — PANTOPRAZOLE SODIUM 40 MG IV SOLR
40.0000 mg | Freq: Two times a day (BID) | INTRAVENOUS | Status: DC
Start: 2015-01-15 — End: 2015-01-16
  Administered 2015-01-15 – 2015-01-16 (×2): 40 mg via INTRAVENOUS
  Filled 2015-01-15 (×3): qty 40

## 2015-01-15 MED ORDER — HYDROCODONE-ACETAMINOPHEN 5-325 MG PO TABS
1.0000 | ORAL_TABLET | ORAL | Status: DC | PRN
  Administered 2015-01-19: 2 via ORAL
  Filled 2015-01-15: qty 2

## 2015-01-15 MED ORDER — TRAMADOL HCL 50 MG PO TABS: 50.0000 mg | ORAL_TABLET | Freq: Four times a day (QID) | ORAL | Status: DC | PRN

## 2015-01-15 MED ORDER — SIMVASTATIN 10 MG PO TABS
10.0000 mg | ORAL_TABLET | Freq: Every day | ORAL | Status: DC
  Administered 2015-01-15 – 2015-01-20 (×6): 10 mg via ORAL
  Filled 2015-01-15 (×7): qty 1

## 2015-01-15 MED ORDER — VITAMIN K1 10 MG/ML IJ SOLN
5.0000 mg | Freq: Once | INTRAVENOUS | Status: AC
  Administered 2015-01-15: 5 mg via INTRAVENOUS
  Filled 2015-01-15: qty 0.5

## 2015-01-15 MED ORDER — FESOTERODINE FUMARATE ER 8 MG PO TB24
8.0000 mg | ORAL_TABLET | Freq: Every day | ORAL | Status: DC
  Administered 2015-01-16 – 2015-01-21 (×6): 8 mg via ORAL
  Filled 2015-01-15 (×7): qty 1

## 2015-01-15 MED ORDER — ACETAMINOPHEN 650 MG RE SUPP
650.0000 mg | Freq: Four times a day (QID) | RECTAL | Status: DC | PRN
Start: 2015-01-15 — End: 2015-01-21

## 2015-01-15 MED ORDER — SODIUM CHLORIDE 0.9 % IV SOLN
INTRAVENOUS | Status: DC
  Administered 2015-01-15 – 2015-01-18 (×4): via INTRAVENOUS

## 2015-01-15 MED ORDER — SODIUM CHLORIDE 0.9 % IV SOLN: Freq: Once | INTRAVENOUS | Status: DC

## 2015-01-15 MED ORDER — MONTELUKAST SODIUM 10 MG PO TABS
10.0000 mg | ORAL_TABLET | Freq: Every day | ORAL | Status: DC
  Administered 2015-01-15 – 2015-01-20 (×6): 10 mg via ORAL
  Filled 2015-01-15 (×7): qty 1

## 2015-01-15 MED ORDER — SODIUM CHLORIDE 0.9 % IV SOLN: INTRAVENOUS | Status: DC

## 2015-01-15 MED ORDER — SODIUM CHLORIDE 0.9 % IV SOLN
Freq: Once | INTRAVENOUS | Status: DC
Start: 2015-01-15 — End: 2015-01-16

## 2015-01-15 MED ORDER — ALBUTEROL SULFATE (2.5 MG/3ML) 0.083% IN NEBU: 2.5000 mg | INHALATION_SOLUTION | Freq: Four times a day (QID) | RESPIRATORY_TRACT | Status: DC | PRN

## 2015-01-15 MED ORDER — NITROGLYCERIN 0.4 MG SL SUBL: 0.4000 mg | SUBLINGUAL_TABLET | SUBLINGUAL | Status: DC | PRN

## 2015-01-15 MED ORDER — BOOST / RESOURCE BREEZE PO LIQD
1.0000 | Freq: Three times a day (TID) | ORAL | Status: DC
  Administered 2015-01-15 – 2015-01-21 (×15): 1 via ORAL

## 2015-01-15 MED ORDER — METRONIDAZOLE IN NACL 5-0.79 MG/ML-% IV SOLN
500.0000 mg | Freq: Three times a day (TID) | INTRAVENOUS | Status: DC
  Administered 2015-01-15 – 2015-01-17 (×5): 500 mg via INTRAVENOUS
  Filled 2015-01-15 (×7): qty 100

## 2015-01-15 MED ORDER — ONDANSETRON HCL 4 MG/2ML IJ SOLN: 4.0000 mg | Freq: Four times a day (QID) | INTRAMUSCULAR | Status: DC | PRN

## 2015-01-15 MED ORDER — FUROSEMIDE 10 MG/ML IJ SOLN: 20.0000 mg | Freq: Once | INTRAMUSCULAR | Status: DC

## 2015-01-15 MED ORDER — ADULT MULTIVITAMIN W/MINERALS CH
1.0000 | ORAL_TABLET | Freq: Every morning | ORAL | Status: DC
  Administered 2015-01-16 – 2015-01-21 (×6): 1 via ORAL
  Filled 2015-01-15 (×7): qty 1

## 2015-01-15 NOTE — ED Notes (Signed)
Blood bank notified this nurse that blood is ready. Waiting for daughter to arrive to sign consent form.

## 2015-01-15 NOTE — H&P (Signed)
[facility] History and Physical  Larry Huffman IWL:[id number] DOB: 06-26-1924 DOA: 01/15/2015  Referring physician: Dr Coralyn Pear.  PCP: Florina Ou, MD   Chief Complaint: fall, confusion.   HPI: Larry Huffman is a 81 y.o. male with PMH significant for PE on coumadin diagnosed Oct 2015, CAD LAD DES 2008, CKD stage III Cr baseline 1.1, who presents after a fall. He was trying to get out of bed and rolled out of bed. He has been feeling weak and tired. Has had decrease appetite. Relates nausea, having diarrhea. He was notice to have melena in the ED. He denies chest pain or dyspnea, or abdominal pain. He has not been eating ok for last few days. He has been mildly confusion, intermittent per family.  He fell on thursday also.   Evaluation in the ED; patient found to have melena, Hb at 6, WBC at 15. CT head negative for intracranial bleed. EKG sinus rhythm. Cr at 1.4, lactic acid at 16.   Review of Systems: Negative except as per HPI.    Past Medical History  Diagnosis Date  . Hypertension   . Acid reflux   . High cholesterol   . Coronary artery disease   . Arthritis   . Sleep apnea     CPAP  . Peripheral vascular disease     Remote left carotid endarterectomy  . Cancer hx of skin cancer  . HOH (hard of hearing)     wears hearing aids, but still can not hear  . Incontinence of urine   . Constipation    Past Surgical History  Procedure Laterality Date  . Rotator cuff repair    . Prostate surgery    . Laparotomy    . Coronary stent placement    . Nasal sinus surgery    . Anal fissure repair    . Eye surgery      cataracts  . Irrigation and debridement abscess    . I&d extremity  06/28/2012    Procedure: IRRIGATION AND DEBRIDEMENT EXTREMITY;  Surgeon: Newt Minion, MD;  Location: [place];  Service: Orthopedics;  Laterality: Right;  Debridement Wound Right Leg, VAC, Antibiotic Beads, Apply A-Cell  . 2-d echocardiogram  03/07/2008    Ejection fraction greater than 55%. Mild  concentric left ventricular hypertrophy.LV relaxation. mildly dilated left atrium. Mild mitral regurgitation.   . Cardiac catheterization      X 2 stents  . Colonoscopy w/ polypectomy    . Colon surgery      removed part of the small intestine  . Lumbar laminectomy N/A 02/08/2014    Procedure: L3-4, L4-5 Decompression;  Surgeon: Marybelle Killings, MD;  Location: [place];  Service: Orthopedics;  Laterality: N/A;  L3-4, L4-5 Decompression   Social History:  reports that he quit smoking about 36 years ago. His smoking use included Cigarettes. He has never used smokeless tobacco. He reports that he does not drink alcohol or use illicit drugs.  Allergies  Allergen Reactions  . Ciprofloxacin Rash  . Dapsone Other (See Comments)    Lowers WBC count?? "made me very sick"    Family History  Problem Relation Age of Onset  . Hypertension Mother     Prior to Admission medications   Medication Sig Start Date End Date Taking? Authorizing Provider  amLODipine (NORVASC) 10 MG tablet Take 10 mg by mouth daily.    Historical Provider, MD  aspirin EC 81 MG tablet Take 81 mg by mouth daily.  Historical Provider, MD  Calcium Carbonate-Vitamin D (CALCIUM + D PO) Take 1 tablet by mouth daily.    Historical Provider, MD  clopidogrel (PLAVIX) 75 MG tablet Take 1 tablet (75 mg total) by mouth daily. 02/12/14   Lorretta Harp, MD  diphenhydramine-acetaminophen (TYLENOL PM) 25-500 MG TABS Take 1 tablet by mouth at bedtime as needed. For pain    Historical Provider, MD  ferrous sulfate 325 (65 FE) MG tablet Take 325 mg by mouth daily with breakfast.    Historical Provider, MD  montelukast (SINGULAIR) 10 MG tablet Take 10 mg by mouth at bedtime.    Historical Provider, MD  Multiple Vitamin (MULTIVITAMIN WITH MINERALS) TABS Take 1 tablet by mouth every morning.     Historical Provider, MD  nitroGLYCERIN (NITROSTAT) 0.4 MG SL tablet Place 1 tablet (0.4 mg total) under the tongue every 5 (five) minutes as needed for chest  pain. 08/08/13   Lorretta Harp, MD  Omega-3 Fatty Acids (FISH OIL) 1200 MG CAPS Take 1,200 mg by mouth daily.     Historical Provider, MD  pantoprazole (PROTONIX) 40 MG tablet Take 1 tablet (40 mg total) by mouth daily. 02/12/14   Lorretta Harp, MD  polyethylene glycol [facility] / Floria Raveling) packet Take 17 g by mouth at bedtime.     Historical Provider, MD  PROVENTIL HFA 108 (90 BASE) MCG/ACT inhaler Inhale 1-2 puffs into the lungs every 6 (six) hours as needed for wheezing or shortness of breath.  08/01/14   Historical Provider, MD  Pyridoxine HCl (VITAMIN B6 PO) Take 100 mg by mouth daily.     Historical Provider, MD  simvastatin (ZOCOR) 10 MG tablet Take 1 tablet (10 mg total) by mouth at bedtime. 02/12/14   Lorretta Harp, MD  sulfamethoxazole-trimethoprim (BACTRIM DS,SEPTRA DS) 800-160 MG per tablet Take 1 tablet by mouth 2 (two) times daily. 12/27/14   Historical Provider, MD  tolterodine (DETROL LA) 4 MG 24 hr capsule Take 4 mg by mouth daily. 03/12/13   Historical Provider, MD  traMADol (ULTRAM) 50 MG tablet Take by mouth every 6 (six) hours as needed.    Historical Provider, MD  valACYclovir (VALTREX) 500 MG tablet Take 1 tablet (500 mg total) by mouth 2 (two) times daily. 12/10/14   Tanna Furry, MD  warfarin (COUMADIN) 5 MG tablet Take 1 tablet by mouth daily or as directed by coumadin clinic Patient taking differently: Take 2.5-5 mg by mouth daily. Take 2.5 on Wed / Sun Take 5 mg on ALL other days 05/22/14   Lorretta Harp, MD   Physical Exam: Filed Vitals:   01/15/15 1700 01/15/15 1735 01/15/15 1736 01/15/15 1745  BP: 137/52 121/53  138/58  Pulse: 81  79 76  Temp:      TempSrc:      Resp:   19 17  SpO2: 96%  98% 98%    Wt Readings from Last 3 Encounters:  09/24/14 82.555 kg (182 lb)  08/08/14 82.555 kg (182 lb)  07/29/14 82.963 kg (182 lb 14.4 oz)    General:  Appears calm and comfortable, eyes close, answer questions.  Eyes: PERRL, normal lids, irises & conjunctiva ENT:  grossly normal hearing, lips & tongue Neck: no LAD, masses or thyromegaly Cardiovascular: RRR, no m/r/g. No LE edema. Respiratory: CTA bilaterally, no w/r/r. Normal respiratory effort. Abdomen: soft, ntnd Skin: multiple skin tears, right arm.  Musculoskeletal: grossly normal tone BUE/BLE Neurologic: grossly non-focal.          Labs  on Admission:  Basic Metabolic Panel:  Recent Labs Lab 01/15/15 1528  NA 135  K 4.5  CL 109  CO2 16*  GLUCOSE 170*  BUN 53*  CREATININE 1.49*  CALCIUM 8.3*   Liver Function Tests:  Recent Labs Lab 01/15/15 1528  AST 31  ALT 19  ALKPHOS 58  BILITOT 0.6  PROT 6.3*  ALBUMIN 2.8*   No results for input(s): LIPASE, AMYLASE in the last 168 hours. No results for input(s): AMMONIA in the last 168 hours. CBC:  Recent Labs Lab 01/15/15 1528  WBC 15.3*  HGB 6.3*  HCT 18.9*  MCV 95.9  PLT 296   Cardiac Enzymes: No results for input(s): CKTOTAL, CKMB, CKMBINDEX, TROPONINI in the last 168 hours.  BNP (last 3 results) No results for input(s): BNP in the last 8760 hours.  ProBNP (last 3 results)  Recent Labs  04/15/14 1450  PROBNP 729.0*    CBG: No results for input(s): GLUCAP in the last 168 hours.  Radiological Exams on Admission: Ct Head Wo Contrast  01/15/2015   CLINICAL DATA:  81 year old male status post fall with increasing confusion.  EXAM: CT HEAD WITHOUT CONTRAST  TECHNIQUE: Contiguous axial images were obtained from the base of the skull through the vertex without intravenous contrast.  COMPARISON:  MRI dated 08/05/2005  FINDINGS: The ventricles are dilated and the sulci are prominent compatible with age-related atrophy. Periventricular and deep white matter hypodensities represent chronic microvascular ischemic changes. Focal hypodensity in the right occipital lobe medially represent chronic ischemia. There is no intracranial hemorrhage. No mass effect or midline shift identified.  The visualized paranasal sinuses and  mastoid air cells are well aerated. The calvarium is intact.  IMPRESSION: No acute intracranial pathology.  Age-related atrophy and chronic microvascular ischemic disease.   Electronically Signed   By: Anner Crete M.D.   On: 01/15/2015 17:26    EKG: Independently reviewed. Normal sinus rhythm, non specific T wave abnormality.   Assessment/Plan Active Problems:   CAD - CFX DES 7/07, LAD DES 10/08, cath 11/11- medical Rx   Pulmonary embolism   Chronic kidney disease (CKD), stage III (moderate)   Chronic anticoagulation   Severe anemia   GI bleed   Acute blood loss anemia   1-GI bleed, melena;  Agree with [facility]FP. Patient PE was more than 6 moths ago.  Patient to received 2 units of PRBC. I have order IV lasix in between blood transfusion.  IV Protonix. Hold coumadin, and plavix.  GI Consulted.  Cycle Hb. Further transfusion as needed.   2-Anemia, acute blood loss; in setting of GI bleed.  Blood transfusion.  See problem number one.   3-Acute on chronic renal failure. Stage III. Last cr per records at 1.1. On admission at 1.4.  Supportive care, IV fluids, blood transfusion.   4-Diarrhea:  Check for C diff.   5- Metabolic acidosis; suspect multifactorial: decrease perfusion, renal failure. IV fluids.   6- History PE, Submassive diagnosed 04-2014. Hold coumadin in setting GI bleed.  FFP and Coumadin. Follow GI recommendation for anticoagulation.   7-Fall; fall precaution. In setting of anemia. CT head negative for acute bleed.   8-Encephalopathy , Confusion; intermittent per family. Supportive care. Rule out infectious process. Suspect related to acute illness. CT head negative for acute bleed.   9-Leukocytosis; check for C diff. UA. Will start empirically flagyl for C diff. If C diff negative ok to DC flagyl.      Code Status: DNR DVT Prophylaxis:SCD. No anticoagulation  in setting of acute bleeding.  Family Communication: care discussed with daughter over phone, grandson  was at bedside.  Disposition Plan: expect 3 to 4 days inpatient.   Time spent: 75 minutes.   Niel Hummer A [facility] Pager [phone]

## 2015-01-15 NOTE — ED Notes (Addendum)
Family reports that pt fell on Thursday and fell again today. States that he fell getting out of bed. States that he hit his face when he fell. Also reports that pt has been increasingly confused over the past few days. Also report decreased appetite, weakness and lethargy. Pt alert and oriented at this time.

## 2015-01-15 NOTE — ED Notes (Signed)
Patients daughter phsyucially signed consent form.

## 2015-01-15 NOTE — ED Provider Notes (Addendum)
CSN: [id number]     Arrival date & time 01/15/15  1508 History   First MD Initiated Contact with Patient 01/15/15 1553     Chief Complaint  Patient presents with  . Fall     (Consider location/radiation/quality/duration/timing/severity/associated sxs/prior Treatment) Patient is a 81 y.o. male presenting with fall. The history is provided by the patient. The history is limited by the condition of the patient.  Fall Pertinent negatives include no chest pain, no abdominal pain, no headaches and no shortness of breath.  Patient s/p fall at home this AM. Was trying to get out of bed and states rolled out of bed to floor. ?hit head. No loc. States had also fell from bed yesterday, and there is report of increased weakness and confusion in the past couple days.  Pt notes poor po intake, poor appetite. Denies abd pain. No nvd. Denies dysuria or gu c/o. No fever or chills. No headache. No cp.  No neck or back pain. Pt limited historian-  Level 5 caveat.      Past Medical History  Diagnosis Date  . Hypertension   . Acid reflux   . High cholesterol   . Coronary artery disease   . Arthritis   . Sleep apnea     CPAP  . Peripheral vascular disease     Remote left carotid endarterectomy  . Cancer hx of skin cancer  . HOH (hard of hearing)     wears hearing aids, but still can not hear  . Incontinence of urine   . Constipation    Past Surgical History  Procedure Laterality Date  . Rotator cuff repair    . Prostate surgery    . Laparotomy    . Coronary stent placement    . Nasal sinus surgery    . Anal fissure repair    . Eye surgery      cataracts  . Irrigation and debridement abscess    . I&d extremity  06/28/2012    Procedure: IRRIGATION AND DEBRIDEMENT EXTREMITY;  Surgeon: Newt Minion, MD;  Location: [place];  Service: Orthopedics;  Laterality: Right;  Debridement Wound Right Leg, VAC, Antibiotic Beads, Apply A-Cell  . 2-d echocardiogram  03/07/2008    Ejection fraction greater  than 55%. Mild concentric left ventricular hypertrophy.LV relaxation. mildly dilated left atrium. Mild mitral regurgitation.   . Cardiac catheterization      X 2 stents  . Colonoscopy w/ polypectomy    . Colon surgery      removed part of the small intestine  . Lumbar laminectomy N/A 02/08/2014    Procedure: L3-4, L4-5 Decompression;  Surgeon: Marybelle Killings, MD;  Location: [place];  Service: Orthopedics;  Laterality: N/A;  L3-4, L4-5 Decompression   Family History  Problem Relation Age of Onset  . Hypertension Mother    History  Substance Use Topics  . Smoking status: Former Smoker    Types: Cigarettes    Quit date: 07/12/1978  . Smokeless tobacco: Never Used  . Alcohol Use: No    Review of Systems  Constitutional: Negative for fever and chills.  HENT: Negative for nosebleeds.   Eyes: Negative for visual disturbance.  Respiratory: Negative for shortness of breath.   Cardiovascular: Negative for chest pain.  Gastrointestinal: Negative for vomiting, abdominal pain and blood in stool.  Genitourinary: Negative for dysuria and flank pain.  Musculoskeletal: Negative for back pain and neck pain.  Skin: Negative for rash.  Neurological: Negative for numbness and headaches.  Hematological: Does not bruise/bleed easily.  Psychiatric/Behavioral: Negative for confusion.      Allergies  Ciprofloxacin and Dapsone  Home Medications   Prior to Admission medications   Medication Sig Start Date End Date Taking? Authorizing Provider  amLODipine (NORVASC) 10 MG tablet Take 10 mg by mouth daily.    Historical Provider, MD  aspirin EC 81 MG tablet Take 81 mg by mouth daily.    Historical Provider, MD  Calcium Carbonate-Vitamin D (CALCIUM + D PO) Take 1 tablet by mouth daily.    Historical Provider, MD  clopidogrel (PLAVIX) 75 MG tablet Take 1 tablet (75 mg total) by mouth daily. 02/12/14   Lorretta Harp, MD  diphenhydramine-acetaminophen (TYLENOL PM) 25-500 MG TABS Take 1 tablet by mouth at  bedtime as needed. For pain    Historical Provider, MD  ferrous sulfate 325 (65 FE) MG tablet Take 325 mg by mouth daily with breakfast.    Historical Provider, MD  montelukast (SINGULAIR) 10 MG tablet Take 10 mg by mouth at bedtime.    Historical Provider, MD  Multiple Vitamin (MULTIVITAMIN WITH MINERALS) TABS Take 1 tablet by mouth every morning.     Historical Provider, MD  nitroGLYCERIN (NITROSTAT) 0.4 MG SL tablet Place 1 tablet (0.4 mg total) under the tongue every 5 (five) minutes as needed for chest pain. 08/08/13   Lorretta Harp, MD  Omega-3 Fatty Acids (FISH OIL) 1200 MG CAPS Take 1,200 mg by mouth daily.     Historical Provider, MD  pantoprazole (PROTONIX) 40 MG tablet Take 1 tablet (40 mg total) by mouth daily. 02/12/14   Lorretta Harp, MD  polyethylene glycol [facility] / Floria Raveling) packet Take 17 g by mouth at bedtime.     Historical Provider, MD  PROVENTIL HFA 108 (90 BASE) MCG/ACT inhaler Inhale 1-2 puffs into the lungs every 6 (six) hours as needed for wheezing or shortness of breath.  08/01/14   Historical Provider, MD  Pyridoxine HCl (VITAMIN B6 PO) Take 100 mg by mouth daily.     Historical Provider, MD  simvastatin (ZOCOR) 10 MG tablet Take 1 tablet (10 mg total) by mouth at bedtime. 02/12/14   Lorretta Harp, MD  sulfamethoxazole-trimethoprim (BACTRIM DS,SEPTRA DS) 800-160 MG per tablet Take 1 tablet by mouth 2 (two) times daily. 12/27/14   Historical Provider, MD  tolterodine (DETROL LA) 4 MG 24 hr capsule Take 4 mg by mouth daily. 03/12/13   Historical Provider, MD  traMADol (ULTRAM) 50 MG tablet Take by mouth every 6 (six) hours as needed.    Historical Provider, MD  valACYclovir (VALTREX) 500 MG tablet Take 1 tablet (500 mg total) by mouth 2 (two) times daily. 12/10/14   Tanna Furry, MD  warfarin (COUMADIN) 5 MG tablet Take 1 tablet by mouth daily or as directed by coumadin clinic Patient taking differently: Take 2.5-5 mg by mouth daily. Take 2.5 on Wed / Sun Take 5 mg on ALL  other days 05/22/14   Lorretta Harp, MD   BP 116/54 mmHg  Pulse 81  Temp(Src) 97.5 F (36.4 C) (Oral)  Resp 18  SpO2 100% Physical Exam  Constitutional: He appears well-developed and well-nourished. No distress.  HENT:  Mouth/Throat: Oropharynx is clear and moist.  Tenderness post scalp, ?contusion  Eyes: Conjunctivae are normal. Pupils are equal, round, and reactive to light. No scleral icterus.  Neck: Neck supple. No tracheal deviation present.  Cardiovascular: Normal rate, regular rhythm, normal heart sounds and intact distal pulses.  Pulmonary/Chest: Effort normal and breath sounds normal. No accessory muscle usage. No respiratory distress. He exhibits no tenderness.  Abdominal: Soft. Bowel sounds are normal. He exhibits no distension and no mass. There is no tenderness. There is no rebound and no guarding.  Genitourinary:  Dark maroon stool/melena, heme pos  Musculoskeletal: Normal range of motion. He exhibits no edema or tenderness.  CTLS spine, non tender, aligned, no step off. Good rom bil ext without pain or focal bony tenderness.   Neurological: He is alert. No cranial nerve deficit.  Alert, hoh, speech quiet but not grossly dysarthric. Motor intact bil, st 5/5. sens grossly intact.   Skin: Skin is warm and dry. He is not diaphoretic. There is pallor.  Psychiatric: He has a normal mood and affect.  Nursing note and vitals reviewed.   ED Course  Procedures (including critical care time) Labs Review  Results for orders placed or performed during the hospital encounter of 01/15/15  CBC  Result Value Ref Range   WBC 15.3 (H) 4.0 - 10.5 K/uL   RBC 1.97 (L) 4.22 - 5.81 MIL/uL   Hemoglobin 6.3 (LL) 13.0 - 17.0 g/dL   HCT 18.9 (L) 39.0 - 52.0 %   MCV 95.9 78.0 - 100.0 fL   MCH 32.0 26.0 - 34.0 pg   MCHC 33.3 30.0 - 36.0 g/dL   RDW 20.4 (H) 11.5 - 15.5 %   Platelets 296 150 - 400 K/uL  Comprehensive metabolic panel  Result Value Ref Range   Sodium 135 135 - 145  mmol/L   Potassium 4.5 3.5 - 5.1 mmol/L   Chloride 109 101 - 111 mmol/L   CO2 16 (L) 22 - 32 mmol/L   Glucose, Bld 170 (H) 65 - 99 mg/dL   BUN 53 (H) 6 - 20 mg/dL   Creatinine, Ser 1.49 (H) 0.61 - 1.24 mg/dL   Calcium 8.3 (L) 8.9 - 10.3 mg/dL   Total Protein 6.3 (L) 6.5 - 8.1 g/dL   Albumin 2.8 (L) 3.5 - 5.0 g/dL   AST 31 15 - 41 U/L   ALT 19 17 - 63 U/L   Alkaline Phosphatase 58 38 - 126 U/L   Total Bilirubin 0.6 0.3 - 1.2 mg/dL   GFR calc non Af Amer 40 (L) >60 mL/min   GFR calc Af Amer 46 (L) >60 mL/min   Anion gap 10 5 - 15  Protime-INR  Result Value Ref Range   Prothrombin Time 31.6 (H) 11.6 - 15.2 seconds   INR 3.14 (H) 0.00 - 1.49  POC occult blood, ED Provider will collect  Result Value Ref Range   Fecal Occult Bld POSITIVE (A) NEGATIVE  Type and screen  Result Value Ref Range   ABO/RH(D) O POS    Antibody Screen PENDING    Sample Expiration 01/18/2015   Prepare RBC  Result Value Ref Range   Order Confirmation ORDER PROCESSED BY BLOOD BANK       EKG Interpretation   Date/Time:  Wednesday January 15 2015 15:29:04 EDT Ventricular Rate:  79 PR Interval:  192 QRS Duration: 84 QT Interval:  370 QTC Calculation: 424 R Axis:   -11 Text Interpretation:  Normal sinus rhythm with sinus arrhythmia Left  ventricular hypertrophy Nonspecific ST abnormality No significant change  since last tracing Confirmed by Alexsandria Kivett  MD, Lennette Bihari ([id number]) on 01/15/2015  4:16:51 PM      MDM   Iv ns. Labs. Continuous pulse ox and monitor.  Reviewed nursing notes and prior charts  for additional history.   Pt on coumadin, fall, weak, will get ct head.  Pt pale. hgb lower than prior, melena on exam - will transfuse prbc, inr pending.  protonix iv. Transfuse 2 units prbc. Awaiting additional labs.   inr 3.   Recheck abd soft nt.  tranfusion prbc as hgb very low.   Medical service contacted for admission.  Gi consulted.   CRITICAL CARE  RE: severe anemia, acute gi bleeding/melena  requiring emergent transfusion Performed by: Mirna Mires Total critical care time: 35 Critical care time was exclusive of separately billable procedures and treating other patients. Critical care was necessary to treat or prevent imminent or life-threatening deterioration. Critical care was time spent personally by me on the following activities: development of treatment plan with patient and/or surrogate as well as nursing, discussions with consultants, evaluation of patient's response to treatment, examination of patient, obtaining history from patient or surrogate, ordering and performing treatments and interventions, ordering and review of laboratory studies, ordering and review of radiographic studies, pulse oximetry and re-evaluation of patient's condition.  Discussed pt, drop in hgb, melena, coumadin, etc with Dr Ardis Hughs, on call for GI - he will consult on pt, indicates agrees w current tx plan.      Lajean Saver, MD 01/15/15 [phone]

## 2015-01-15 NOTE — ED Notes (Signed)
[name] is the patients daughter, pt is too confused to consent for blood transfusion. Pt daughter gave permission to start the blood transfusion by telephone to Ocie Bob RN and Rolene Arbour RN.

## 2015-01-16 ENCOUNTER — Encounter (HOSPITAL_COMMUNITY)
Admission: EM | Disposition: A | Discharge status: Home Health | Payer: Self-pay | Source: Home / Self Care | Attending: Internal Medicine

## 2015-01-16 ENCOUNTER — Encounter (HOSPITAL_COMMUNITY): Payer: Self-pay | Admitting: *Deleted

## 2015-01-16 DIAGNOSIS — Z7901 Long term (current) use of anticoagulants: Secondary | ICD-10-CM

## 2015-01-16 DIAGNOSIS — D689 Coagulation defect, unspecified: Secondary | ICD-10-CM

## 2015-01-16 DIAGNOSIS — E440 Moderate protein-calorie malnutrition: Secondary | ICD-10-CM

## 2015-01-16 DIAGNOSIS — T45515A Adverse effect of anticoagulants, initial encounter: Secondary | ICD-10-CM

## 2015-01-16 DIAGNOSIS — D6832 Hemorrhagic disorder due to extrinsic circulating anticoagulants: Secondary | ICD-10-CM | POA: Diagnosis present

## 2015-01-16 DIAGNOSIS — D62 Acute posthemorrhagic anemia: Secondary | ICD-10-CM

## 2015-01-16 DIAGNOSIS — I2510 Atherosclerotic heart disease of native coronary artery without angina pectoris: Secondary | ICD-10-CM

## 2015-01-16 DIAGNOSIS — N183 Chronic kidney disease, stage 3 (moderate): Secondary | ICD-10-CM

## 2015-01-16 DIAGNOSIS — D649 Anemia, unspecified: Secondary | ICD-10-CM

## 2015-01-16 DIAGNOSIS — K921 Melena: Secondary | ICD-10-CM

## 2015-01-16 DIAGNOSIS — I2699 Other pulmonary embolism without acute cor pulmonale: Secondary | ICD-10-CM

## 2015-01-16 HISTORY — PX: ESOPHAGOGASTRODUODENOSCOPY: SHX5428

## 2015-01-16 LAB — BASIC METABOLIC PANEL
Anion gap: 7 (ref 5–15)
BUN: 52 mg/dL — ABNORMAL HIGH (ref 6–20)
CHLORIDE: 114 mmol/L — AB (ref 101–111)
CO2: 18 mmol/L — ABNORMAL LOW (ref 22–32)
Calcium: 8.1 mg/dL — ABNORMAL LOW (ref 8.9–10.3)
Creatinine, Ser: 1.36 mg/dL — ABNORMAL HIGH (ref 0.61–1.24)
GFR calc non Af Amer: 45 mL/min — ABNORMAL LOW (ref >60)
GFR, EST AFRICAN AMERICAN: 52 mL/min — AB (ref >60)
Glucose, Bld: 161 mg/dL — ABNORMAL HIGH (ref 65–99)
Potassium: 3.9 mmol/L (ref 3.5–5.1)
Sodium: 139 mmol/L (ref 135–145)

## 2015-01-16 LAB — URINALYSIS, ROUTINE W REFLEX MICROSCOPIC
BILIRUBIN URINE: NEGATIVE
Glucose, UA: NEGATIVE mg/dL
Hgb urine dipstick: NEGATIVE
Ketones, ur: NEGATIVE mg/dL
LEUKOCYTES UA: NEGATIVE
NITRITE: NEGATIVE
PH: 5 (ref 5.0–8.0)
Protein, ur: NEGATIVE mg/dL
SPECIFIC GRAVITY, URINE: 1.022 (ref 1.005–1.030)
Urobilinogen, UA: 0.2 mg/dL (ref 0.0–1.0)

## 2015-01-16 LAB — CBC
HCT: 21.9 % — ABNORMAL LOW (ref 39.0–52.0)
HCT: 20.8 % — ABNORMAL LOW (ref 39.0–52.0)
HEMATOCRIT: 21.2 % — AB (ref 39.0–52.0)
HEMOGLOBIN: 7.6 g/dL — AB (ref 13.0–17.0)
Hemoglobin: 7.3 g/dL — ABNORMAL LOW (ref 13.0–17.0)
Hemoglobin: 7.3 g/dL — ABNORMAL LOW (ref 13.0–17.0)
MCH: 29.9 pg (ref 26.0–34.0)
MCH: 30.9 pg (ref 26.0–34.0)
MCH: 30.5 pg (ref 26.0–34.0)
MCHC: 34.7 g/dL (ref 30.0–36.0)
MCHC: 35.1 g/dL (ref 30.0–36.0)
MCHC: 34.4 g/dL (ref 30.0–36.0)
MCV: 86.2 fL (ref 78.0–100.0)
MCV: 88.1 fL (ref 78.0–100.0)
MCV: 88.7 fL (ref 78.0–100.0)
PLATELETS: 191 10*3/uL (ref 150–400)
PLATELETS: 194 10*3/uL (ref 150–400)
Platelets: 182 10*3/uL (ref 150–400)
RBC: 2.54 MIL/uL — AB (ref 4.22–5.81)
RBC: 2.36 MIL/uL — ABNORMAL LOW (ref 4.22–5.81)
RBC: 2.39 MIL/uL — AB (ref 4.22–5.81)
RDW: 19.8 % — ABNORMAL HIGH (ref 11.5–15.5)
RDW: 20 % — ABNORMAL HIGH (ref 11.5–15.5)
RDW: 21.2 % — ABNORMAL HIGH (ref 11.5–15.5)
WBC: 11.3 10*3/uL — ABNORMAL HIGH (ref 4.0–10.5)
WBC: 10.9 10*3/uL — ABNORMAL HIGH (ref 4.0–10.5)
WBC: 11.4 10*3/uL — AB (ref 4.0–10.5)

## 2015-01-16 LAB — PROTIME-INR
INR: 1.46 (ref 0.00–1.49)
PROTHROMBIN TIME: 17.8 s — AB (ref 11.6–15.2)

## 2015-01-16 SURGERY — EGD (ESOPHAGOGASTRODUODENOSCOPY)
Anesthesia: Moderate Sedation

## 2015-01-16 MED ORDER — MIDAZOLAM HCL 5 MG/ML IJ SOLN
INTRAMUSCULAR | Status: AC
  Filled 2015-01-16: qty 2

## 2015-01-16 MED ORDER — MIDAZOLAM HCL 10 MG/2ML IJ SOLN
INTRAMUSCULAR | Status: DC | PRN
  Administered 2015-01-16: .5 mg via INTRAVENOUS
  Administered 2015-01-16 (×2): 1 mg via INTRAVENOUS

## 2015-01-16 MED ORDER — FENTANYL CITRATE (PF) 100 MCG/2ML IJ SOLN
INTRAMUSCULAR | Status: AC
  Filled 2015-01-16: qty 2

## 2015-01-16 MED ORDER — SODIUM CHLORIDE 0.9 % IV SOLN
INTRAVENOUS | Status: DC
  Administered 2015-01-16: 500 mL via INTRAVENOUS

## 2015-01-16 MED ORDER — FENTANYL CITRATE (PF) 100 MCG/2ML IJ SOLN
INTRAMUSCULAR | Status: DC | PRN
  Administered 2015-01-16 (×2): 12.5 ug via INTRAVENOUS

## 2015-01-16 NOTE — Op Note (Signed)
[facility] [place], [id number]   ENDOSCOPY PROCEDURE REPORT  PATIENT: Larry, Huffman  MR#: [id number] BIRTHDATE: 08-13-1923 , 89  yrs. old GENDER: male ENDOSCOPIST: Eustace Quail, MD REFERRED BY:  [facility] PROCEDURE DATE:  01/16/2015 PROCEDURE:  EGD, diagnostic ASA CLASS:     Class III INDICATIONS:  melena. MEDICATIONS: Fentanyl 25 mcg IV and Versed 2 mg IV TOPICAL ANESTHETIC: Cetacaine Spray  DESCRIPTION OF PROCEDURE: After the risks benefits and alternatives of the procedure were thoroughly explained, informed consent was obtained.  The Pentax Gastroscope [id number] endoscope was introduced through the mouth and advanced to the second portion of the duodenum , Without limitations.  The instrument was slowly withdrawn as the mucosa was fully examined.  EXAM: The esophagus and gastroesophageal junction were completely normal in appearance.  The stomach was entered and closely examined.The antrum, angularis, and lesser curvature were well visualized, including a retroflexed view of the cardia and fundus. The stomach wall was normally distensable.  The scope passed easily through the pylorus into the duodenum.  Retroflexed views revealed no abnormalities.     The scope was then withdrawn from the patient and the procedure completed.  COMPLICATIONS: There were no immediate complications.  ENDOSCOPIC IMPRESSION: 1. Normal EGD. No blood, bleeding, or mucosal abnormalities 2. Conceivable that he had bleeding from small AVM in the small intestine or colon. Could also have a colonic lesion. However, given her advanced age, frail state, and comorbidities would not pursue further workup. It may be that coming off Coumadin alone (greater than 6 months since DVT) will suffice. He can stay on Plavix and aspirin  RECOMMENDATIONS: 1. Continue daily oral PPI. He takes at home. 2. Okay for aspirin and Plavix as noted above 3. Would keep  off Coumadin as noted above 4. Monitor blood counts in stools. Transfuse as needed. Advance diet. Send home when clear that bleeding has ceased  REPEAT EXAM:  eSigned:  Eustace Quail, MD 01/16/2015 3:40 PM    CC:The Patient  ; [facility]

## 2015-01-16 NOTE — Consult Note (Signed)
[facility] Consult: 8:30 AM 01/16/2015  LOS: 1 day    Referring Provider: Dr Hartford Poli Primary Care Physician:  Florina Ou, MD Primary Gastroenterologist:  Dr. Earle Gell, last procedure 2003.     Reason for Consultation:  GI Bleed.    HPI: Larry Huffman is a 81 y.o. male.  Hx CAD, s/p  CFX and LAD DEStents: on chronic Plavix and 81 ASA . S/p left CEA.  On CPAP for OSA.  Chronic Coumadin for LE DVT in 04/2014. Grade 1 diastolic dysfxn and EF 60 to 65% 04/2014. CKD.  Remote HP colon polyps.  Remote repair of anal fissure and some sort of intestinal surgery but on colonoscopy of 2003, no post-surgical changes noted.  The intestinal surgery may have been related to complication of his prostate surgery.  Lumbar spine surgery 2015 for stenosis/neurologic claudication.  02/2002 colonoscopy: hx of HP polyp in 1987 colonoscopy, 2 subsequent Flex sigs both normal.  ASSESSMENT: Normal screening proctocolonoscopy to the cecum. No endoscopic evidence for the presence of colorectal neoplasia.   Pt underwent implantation of artificial urinary shincter to control TURP related urinary incontinence (bladder perforation and "blocked bowel" complication) at [facility] late May 2016.  Treated with extended course of Bactrim. The device was due to be activated on 7/15.  About 1 week later, 5/31,  he developed shingles on left side of face, eye, mouth.  This was treated with Valtrex, prednisone 20 mg. .  Became progressively weaker, anorexic but not vomiting. 6/30 he fell, sustaining injuries to right upper extremity.  After that really declined, unable to walk or do ADLs without max assist.  PMD office did not return message left by family earlier this week, he fell out of bed yesterday and family finally brought pt to ED yesterday.     Stool described as melenic and FOBT +, though family, nor pt did not note this at home.  Hgb 6.3, c/w baseline of 9.5 in 04/2014, 10.5 on 12/10/14. Hgb after PRBCs is 7.6.  MCV normal.   Coags 31/3.1, now 17.8/1.4 after FFP and Vit K.  Also has AKI: 53/1.4.   CT head shows age related atrophy and vascular ischemia.    Home meds include the ASA, coumadin, plavix, prednisone, Protonix 40 mg daily.  Ibuprofen on some of his med lists, but not believed to be taking this recently.     Past Medical History  Diagnosis Date  . Hypertension   . Acid reflux   . High cholesterol   . Coronary artery disease   . Arthritis   . Sleep apnea     CPAP  . Peripheral vascular disease     Remote left carotid endarterectomy  . Cancer hx of skin cancer  . HOH (hard of hearing)     wears hearing aids, but still can not hear  . Incontinence of urine   . Constipation     Past Surgical History  Procedure Laterality Date  . Rotator cuff repair    . Prostate surgery    . Laparotomy    .  Coronary stent placement    . Nasal sinus surgery    . Anal fissure repair    . Eye surgery      cataracts  . Irrigation and debridement abscess    . I&d extremity  06/28/2012    Procedure: IRRIGATION AND DEBRIDEMENT EXTREMITY;  Surgeon: Newt Minion, MD;  Location: [facility];  Service: Orthopedics;  Laterality: Right;  Debridement Wound Right Leg, VAC, Antibiotic Beads, Apply A-Cell  . 2-d echocardiogram  03/07/2008    Ejection fraction greater than 55%. Mild concentric left ventricular hypertrophy.LV relaxation. mildly dilated left atrium. Mild mitral regurgitation.   . Cardiac catheterization      X 2 stents  . Colonoscopy w/ polypectomy    . Colon surgery      removed part of the small intestine  . Lumbar laminectomy N/A 02/08/2014    Procedure: L3-4, L4-5 Decompression;  Surgeon: Marybelle Killings, MD;  Location: [facility];  Service: Orthopedics;  Laterality: N/A;  L3-4, L4-5 Decompression    Prior to Admission  medications   Medication Sig Start Date End Date Taking? Authorizing Provider  predniSONE (DELTASONE) 20 MG tablet Take 20 mg by mouth. 12/17/14  Yes Historical Provider, MD  sennosides-docusate sodium (SENOKOT-S) 8.6-50 MG tablet Take 1 tablet by mouth. 12/03/14  Yes Historical Provider, MD  valACYclovir (VALTREX) 1000 MG tablet Take 1 g by mouth daily. 12/16/14  Yes Historical Provider, MD  warfarin (COUMADIN) 5 MG tablet Take 2.5-5 mg by mouth. Take 2.5 mg on Sunday.  Take 5 mg all other days. 12/05/14  Yes Historical Provider, MD  amLODipine (NORVASC) 10 MG tablet Take 10 mg by mouth daily.    Historical Provider, MD  aspirin EC 81 MG tablet Take 81 mg by mouth daily.    Historical Provider, MD  atropine 1 % ophthalmic solution  12/16/14   Historical Provider, MD  brimonidine (ALPHAGAN) 0.2 % ophthalmic solution  12/16/14   Historical Provider, MD  Calcium Carbonate-Vitamin D (CALCIUM + D PO) Take 1 tablet by mouth daily.    Historical Provider, MD  clopidogrel (PLAVIX) 75 MG tablet Take 1 tablet (75 mg total) by mouth daily. 02/12/14   Lorretta Harp, MD  diphenhydramine-acetaminophen (TYLENOL PM) 25-500 MG TABS Take 1 tablet by mouth at bedtime as needed. For pain    Historical Provider, MD  ferrous sulfate 325 (65 FE) MG tablet Take 325 mg by mouth daily with breakfast.    Historical Provider, MD  fluticasone (FLONASE) 50 MCG/ACT nasal spray Place 1 spray into both nostrils daily.  12/06/14   Historical Provider, MD  ibuprofen (ADVIL,MOTRIN) 400 MG tablet Take 400 mg by mouth. 12/06/14   Historical Provider, MD  montelukast (SINGULAIR) 10 MG tablet Take 10 mg by mouth at bedtime.    Historical Provider, MD  Multiple Vitamin (MULTIVITAMIN WITH MINERALS) TABS Take 1 tablet by mouth every morning.     Historical Provider, MD  nitroGLYCERIN (NITROSTAT) 0.4 MG SL tablet Place 1 tablet (0.4 mg total) under the tongue every 5 (five) minutes as needed for chest pain. 08/08/13   Lorretta Harp, MD  Omega-3  1000 MG CAPS Take 1 g by mouth daily.    Historical Provider, MD  Omega-3 Fatty Acids (FISH OIL) 1200 MG CAPS Take 1,200 mg by mouth daily.     Historical Provider, MD  pantoprazole (PROTONIX) 40 MG tablet Take 1 tablet (40 mg total) by mouth daily. 02/12/14   Lorretta Harp, MD  polyethylene glycol [facility] /  GLYCOLAX) packet Take 17 g by mouth at bedtime.     Historical Provider, MD  PROVENTIL HFA 108 (90 BASE) MCG/ACT inhaler Inhale 1-2 puffs into the lungs every 6 (six) hours as needed for wheezing or shortness of breath.  08/01/14   Historical Provider, MD  Pyridoxine HCl (VITAMIN B6 PO) Take 100 mg by mouth daily.     Historical Provider, MD  simvastatin (ZOCOR) 10 MG tablet Take 1 tablet (10 mg total) by mouth at bedtime. 02/12/14   Lorretta Harp, MD  sulfamethoxazole-trimethoprim (BACTRIM DS,SEPTRA DS) 800-160 MG per tablet Take 1 tablet by mouth 2 (two) times daily. 12/27/14   Historical Provider, MD  tolterodine (DETROL LA) 4 MG 24 hr capsule Take 4 mg by mouth daily. 03/12/13   Historical Provider, MD  traMADol (ULTRAM) 50 MG tablet Take by mouth every 6 (six) hours as needed for moderate pain.     Historical Provider, MD  valACYclovir (VALTREX) 1000 MG tablet Take 1 g by mouth 3 (three) times daily. For 10 days 01/04/15   Historical Provider, MD  valACYclovir (VALTREX) 500 MG tablet Take 1 tablet (500 mg total) by mouth 2 (two) times daily. 12/10/14   Tanna Furry, MD  warfarin (COUMADIN) 5 MG tablet Take 1 tablet by mouth daily or as directed by coumadin clinic Patient taking differently: Take 2.5-5 mg by mouth daily. Take 2.5 on Wed / Sun Take 5 mg on ALL other days 05/22/14   Lorretta Harp, MD    Scheduled Meds: . sodium chloride   Intravenous Once  . sodium chloride   Intravenous Once  . feeding supplement (RESOURCE BREEZE)  1 Container Oral TID BM  . fesoterodine  8 mg Oral Daily  . furosemide  20 mg Intravenous Once  . metronidazole  500 mg Intravenous Q8H  . montelukast  10 mg  Oral QHS  . multivitamin with minerals  1 tablet Oral q morning - 10a  . pantoprazole (PROTONIX) IV  40 mg Intravenous Q12H  . simvastatin  10 mg Oral QHS  . valACYclovir  500 mg Oral BID   Infusions: . sodium chloride 50 mL/hr at 01/16/15 0700   PRN Meds: acetaminophen **OR** acetaminophen, albuterol, HYDROcodone-acetaminophen, nitroGLYCERIN, ondansetron **OR** ondansetron (ZOFRAN) IV, traMADol   Allergies as of 01/15/2015 - Review Complete 01/15/2015  Allergen Reaction Noted  . Ciprofloxacin Rash 02/08/2014  . Dapsone Other (See Comments) 07/28/2012    Family History  Problem Relation Age of Onset  . Hypertension Mother     History   Social History  . Marital Status: Widowed    Spouse Name: N/A  . Number of Children: N/A  . Years of Education: N/A   Occupational History  . Not on file.   Social History Main Topics  . Smoking status: Former Smoker    Types: Cigarettes    Quit date: 07/12/1978  . Smokeless tobacco: Never Used  . Alcohol Use: No  . Drug Use: No  . Sexual Activity: Not on file   Other Topics Concern  . Not on file   Social History Narrative    REVIEW OF SYSTEMS: Constitutional:  Weight drop of 28 # in last 4 months.  ENT:  No nose bleeds.  HOH but mostly does not wear his hearing aids.  Pulm:  No SOB, come cough CV:  No palpitations, commonly has LE edema though this recently has been better. .  GU:  No hematuria, no frequency GI:  Per HPI.  No heartburn, no  nausea.  No dysphagia or choking on food Heme:  No excessive bleeding but did sustain bruising on right arm after fall on 6/30   Transfusions:  Recalls remote transfusion for unknown reason Neuro:  No headaches, no peripheral tingling or numbness Derm:  No itching, no rash or sores.  Endocrine:  No sweats or chills.  No polyuria or dysuria Immunization:  Not queried.  Travel:  None beyond local counties in last few months.    PHYSICAL EXAM: Vital signs in last 24 hours: Filed  Vitals:   01/16/15 0800  BP: 128/42  Pulse: 59  Temp: 98.3 F (36.8 C)  Resp: 16   Wt Readings from Last 3 Encounters:  01/16/15 154 lb 8.7 oz (70.1 kg)  09/24/14 182 lb (82.555 kg)  08/08/14 182 lb (82.555 kg)   General: pleasant, HOH, frail elderly WM.  Head:  No signs of trauma or of lingering shingles.  No asymmetry  Eyes:  No icterus or pallor. Right eyelid partially closed Ears:  HOH  Nose:  No discharge  Mouth:  Dry but clear oral mm. Neck:  No mass or JVD.  Old CEA scar on left Lungs:  Clear bil.  No labored breathing or cough.  Heart: RRR.  No mrg Abdomen:  Soft, NT, ND.  No mass or HSM. Healed low midline scar and fresher/pink 2 inch incision at groin.   Rectal: black/maroon melenic stool, no mass.    Musc/Skeltl: no joint swelling or redness Extremities:  Minimal non-pitting pedal edema  Neurologic:  Oriented to place, situation, go the year wrong at 2020.  Appropriate, follows commands.  Does not strike me that he has advanced dementia.  Skin:  Skin tears and abrasions on UE covered with bandage Tattoos:  none Nodes:  No cervical or inguinal adenopathy.    Psych:  Pleasant, in fact cracking jokes.  Not agitated.   Intake/Output from previous day: 07/06 0701 - 07/07 0700 In: 1791.3 [I.V.:483.3; Blood:1208; IV Piggyback:100] Out: 300 [Urine:300] Intake/Output this shift: Total I/O In: 50 [I.V.:50] Out: -   LAB RESULTS:  Recent Labs  01/15/15 1528 01/16/15 0548  WBC 15.3* 11.3*  HGB 6.3* 7.6*  HCT 18.9* 21.9*  PLT 296 182   BMET Lab Results  Component Value Date   NA 139 01/16/2015   NA 135 01/15/2015   NA 141 04/14/2014   K 3.9 01/16/2015   K 4.5 01/15/2015   K 4.3 04/14/2014   CL 114* 01/16/2015   CL 109 01/15/2015   CL 107 04/14/2014   CO2 18* 01/16/2015   CO2 16* 01/15/2015   CO2 22 04/14/2014   GLUCOSE 161* 01/16/2015   GLUCOSE 170* 01/15/2015   GLUCOSE 120* 04/14/2014   BUN 52* 01/16/2015   BUN 53* 01/15/2015   BUN 28* 04/14/2014     CREATININE 1.36* 01/16/2015   CREATININE 1.49* 01/15/2015   CREATININE 1.17 04/14/2014   CALCIUM 8.1* 01/16/2015   CALCIUM 8.3* 01/15/2015   CALCIUM 8.5 04/14/2014   LFT  Recent Labs  01/15/15 1528  PROT 6.3*  ALBUMIN 2.8*  AST 31  ALT 19  ALKPHOS 58  BILITOT 0.6   PT/INR Lab Results  Component Value Date   INR 1.46 01/16/2015   INR 3.14* 01/15/2015   INR 4.3 01/03/2015   Hepatitis Panel No results for input(s): HEPBSAG, HCVAB, HEPAIGM, HEPBIGM in the last 72 hours. C-Diff No components found for: CDIFF Lipase     Component Value Date/Time   LIPASE 18 04/06/2008 1426  RADIOLOGY STUDIES: Ct Head Wo Contrast  01/15/2015   CLINICAL DATA:  81 year old male status post fall with increasing confusion.  EXAM: CT HEAD WITHOUT CONTRAST  TECHNIQUE: Contiguous axial images were obtained from the base of the skull through the vertex without intravenous contrast.  COMPARISON:  MRI dated 08/05/2005  FINDINGS: The ventricles are dilated and the sulci are prominent compatible with age-related atrophy. Periventricular and deep white matter hypodensities represent chronic microvascular ischemic changes. Focal hypodensity in the right occipital lobe medially represent chronic ischemia. There is no intracranial hemorrhage. No mass effect or midline shift identified.  The visualized paranasal sinuses and mastoid air cells are well aerated. The calvarium is intact.  IMPRESSION: No acute intracranial pathology.  Age-related atrophy and chronic microvascular ischemic disease.   Electronically Signed   By: Anner Crete M.D.   On: 01/15/2015 17:26    ENDOSCOPIC STUDIES: None known  IMPRESSION:   *  Symptomatic acute on chronic anemia.  FOBT +.  In setting of multiple blood thinners, prednisone for last 4 weeks,  Chronic po Iron.  Melena, and recent anorexia/FTT.  Suspect ulcer vs AVM vs neoplasia  *  Chronic Coumadin, plavix and ASA.    *  Previous intestinal surgery, details hazy  and due to blockage occurring at time of his prostate surgery (date of this not found).  No post surgical changes of colon noted on 2003 colonoscopy.  S/P remote surgical repair of anal fissure.   *  Urinary incontinence post TURP, bladder perf.  S/p AUS 11/2014.    PLAN:     *  EGD.  2:45 today.  D/w Dr Henrene Pastor, pt and his dtr.   Azucena Freed  01/16/2015, 8:30 AM Pager: [phone]  GI ATTENDING  History, laboratories, x-rays reviewed. Patient personally seen and examined. Very complicated elderly male with multiple medical problems on multiple blood thinning agents. Presents now with what appears to be an upper GI bleed. Coagulopathy has been corrected. Still with Plavix and aspirin in system. Plan upper GI endoscopy to help assess his risk and possible diagnosis with prognostic information.The nature of the procedure, as well as the risks, benefits, and alternatives were carefully and thoroughly reviewed with the patient. Ample time for discussion and questions allowed. The patient understood, was satisfied, and agreed to proceed. The patient is very high risk given his age and comorbidities  Docia Chuck. Geri Seminole., M.D. [facility] Division of Gastroenterology

## 2015-01-16 NOTE — Progress Notes (Signed)
Utilization review completed. Kalid Ghan, RN, BSN. 

## 2015-01-16 NOTE — Progress Notes (Signed)
[facility] PROGRESS NOTE   Huffman Larry LZJ:[id number] DOB: [birth year] DOA: 01/15/2015 PCP: Florina Ou, MD  HPI/Subjective: Patient reported he is tired, no pain, denies any other complaints. Per nursing staff no bloody bowel movement while he was in the hospital.  Assessment/Plan: Active Problems:   CAD - CFX DES 7/07, LAD DES 10/08, cath 11/11- medical Rx   Pulmonary embolism   Chronic kidney disease (CKD), stage III (moderate)   Chronic anticoagulation   Severe anemia   GI bleed   Acute blood loss anemia   Malnutrition of moderate degree    GI bleed, melena;  Presented the hospital with melanotic stools, patient is on Coumadin. His INR 3.1 and the time of admission. Coumadin and Plavix held, started on IV Protonix. GI Consulted, for further evaluation.  Anemia, acute blood loss; in setting of GI bleed.  Presented with hemoglobin of 6.3, hemoglobin baseline is 9.8 from 04/20/2014. Status post transfusion of 2 units of packed RBCs, hemoglobin only improved to 7.6. Check hemoglobin every 8 hours, transfuse if hemoglobin less than 7.5 or the hemoglobin is trending down.  Acute on chronic renal failure. Stage III.  Creatinine baseline is 1.17, presented with creatinine of 1.49. Supportive care, IV fluids, blood transfusion, check BMP in a.m.  Diarrhea:  Check for C diff.   Metabolic acidosis; suspect multifactorial: decrease perfusion, renal failure. IV fluids.   History PE, Submassive diagnosed 04-2014. Hold coumadin in setting GI bleed.  FFP and Coumadin. Follow GI recommendation for anticoagulation.  We will reevaluate the need for anticoagulation as this PE was provoked by surgery, had anticoagulation for more than 6 months.  Fall; fall precaution. In setting of anemia. CT head negative for acute bleed.   Encephalopathy , Confusion; intermittent per family. Supportive care. Rule out infectious process. Suspect related to acute illness. CT head negative  for acute bleed.   Leukocytosis; check for C diff. UA. Will start empirically flagyl for C diff. If C diff negative ok to DC flagyl.   Code Status: DNR Family Communication: Plan discussed with the patient. Disposition Plan: Remains inpatient Diet: Diet clear liquid Room service appropriate?: Yes; Fluid consistency:: Thin  Consultants:  GI  Procedures:  Transfusion of 2 units packed RBCs on 01/15/15  Transfusion of 2 units of FFP's on 01/15/2015  Antibiotics:  None   Objective: Filed Vitals:   01/16/15 0800  BP: 128/42  Pulse: 59  Temp: 98.3 F (36.8 C)  Resp: 16    Intake/Output Summary (Last 24 hours) at 01/16/15 1120 Last data filed at 01/16/15 1000  Gross per 24 hour  Intake 2061.33 ml  Output    550 ml  Net 1511.33 ml   Filed Weights   01/15/15 1900 01/16/15 0500  Weight: 69.1 kg (152 lb 5.4 oz) 70.1 kg (154 lb 8.7 oz)    Exam: General: Alert and awake, oriented x3, not in any acute distress. HEENT: anicteric sclera, pupils reactive to light and accommodation, EOMI CVS: S1-S2 clear, no murmur rubs or gallops Chest: clear to auscultation bilaterally, no wheezing, rales or rhonchi Abdomen: soft nontender, nondistended, normal bowel sounds, no organomegaly Extremities: no cyanosis, clubbing or edema noted bilaterally Neuro: Cranial nerves II-XII intact, no focal neurological deficits  Data Reviewed: Basic Metabolic Panel:  Recent Labs Lab 01/15/15 1528 01/16/15 0307  NA 135 139  K 4.5 3.9  CL 109 114*  CO2 16* 18*  GLUCOSE 170* 161*  BUN 53* 52*  CREATININE 1.49* 1.36*  CALCIUM 8.3* 8.1*  Liver Function Tests:  Recent Labs Lab 01/15/15 1528  AST 31  ALT 19  ALKPHOS 58  BILITOT 0.6  PROT 6.3*  ALBUMIN 2.8*   No results for input(s): LIPASE, AMYLASE in the last 168 hours. No results for input(s): AMMONIA in the last 168 hours. CBC:  Recent Labs Lab 01/15/15 1528 01/16/15 0548  WBC 15.3* 11.3*  HGB 6.3* 7.6*  HCT 18.9* 21.9*    MCV 95.9 86.2  PLT 296 182   Cardiac Enzymes: No results for input(s): CKTOTAL, CKMB, CKMBINDEX, TROPONINI in the last 168 hours. BNP (last 3 results) No results for input(s): BNP in the last 8760 hours.  ProBNP (last 3 results)  Recent Labs  04/15/14 1450  PROBNP 729.0*    CBG: No results for input(s): GLUCAP in the last 168 hours.  Micro Recent Results (from the past 240 hour(s))  Urine culture     Status: None   Collection Time: 01/09/15 11:39 PM  Result Value Ref Range Status   Specimen Description URINE, CLEAN CATCH  Final   Special Requests Normal  Final   Culture 20,000 COLONIES/mL ENTEROCOCCUS SPECIES  Final   Report Status 01/12/2015 FINAL  Final   Organism ID, Bacteria ENTEROCOCCUS SPECIES  Final      Susceptibility   Enterococcus species - MIC*    AMPICILLIN <=2 SENSITIVE Sensitive     LEVOFLOXACIN 1 SENSITIVE Sensitive     NITROFURANTOIN <=16 SENSITIVE Sensitive     VANCOMYCIN 1 SENSITIVE Sensitive     LINEZOLID 2 SENSITIVE Sensitive     * 20,000 COLONIES/mL ENTEROCOCCUS SPECIES  MRSA PCR Screening     Status: None   Collection Time: 01/15/15  7:56 PM  Result Value Ref Range Status   MRSA by PCR NEGATIVE NEGATIVE Final    Comment:        The GeneXpert MRSA Assay (FDA approved for NASAL specimens only), is one component of a comprehensive MRSA colonization surveillance program. It is not intended to diagnose MRSA infection nor to guide or monitor treatment for MRSA infections.      Studies: Ct Head Wo Contrast  01/15/2015   CLINICAL DATA:  81 year old male status post fall with increasing confusion.  EXAM: CT HEAD WITHOUT CONTRAST  TECHNIQUE: Contiguous axial images were obtained from the base of the skull through the vertex without intravenous contrast.  COMPARISON:  MRI dated 08/05/2005  FINDINGS: The ventricles are dilated and the sulci are prominent compatible with age-related atrophy. Periventricular and deep white matter hypodensities  represent chronic microvascular ischemic changes. Focal hypodensity in the right occipital lobe medially represent chronic ischemia. There is no intracranial hemorrhage. No mass effect or midline shift identified.  The visualized paranasal sinuses and mastoid air cells are well aerated. The calvarium is intact.  IMPRESSION: No acute intracranial pathology.  Age-related atrophy and chronic microvascular ischemic disease.   Electronically Signed   By: Anner Crete M.D.   On: 01/15/2015 17:26    Scheduled Meds: . sodium chloride   Intravenous Once  . sodium chloride   Intravenous Once  . feeding supplement (RESOURCE BREEZE)  1 Container Oral TID BM  . fesoterodine  8 mg Oral Daily  . furosemide  20 mg Intravenous Once  . metronidazole  500 mg Intravenous Q8H  . montelukast  10 mg Oral QHS  . multivitamin with minerals  1 tablet Oral q morning - 10a  . pantoprazole (PROTONIX) IV  40 mg Intravenous Q12H  . simvastatin  10 mg Oral QHS  .  valACYclovir  500 mg Oral BID   Continuous Infusions: . sodium chloride 50 mL/hr at 01/16/15 0700       Time spent: 35 minutes    [facility]  [facility] Pager [phone] If 7PM-7AM, please contact night-coverage at [email or web address], password [email or web address] 01/16/2015, 11:20 AM  LOS: 1 day

## 2015-01-16 NOTE — Progress Notes (Signed)
Initial Nutrition Assessment  DOCUMENTATION CODES:  Non-severe (moderate) malnutrition in context of acute illness/injury  INTERVENTION:  -Resource Breeze po TID, each supplement provides 250 kcal and 9 grams of protein -MVI daily  NUTRITION DIAGNOSIS:  Malnutrition related to acute illness as evidenced by meal completion < 25%, mild depletion of body fat, mild depletion of muscle mass.  GOAL:  Patient will meet greater than or equal to 90% of their needs  MONITOR:  PO intake, Supplement acceptance, Diet advancement, Labs, Weight trends, Skin, I & O's  REASON FOR ASSESSMENT:  Malnutrition Screening Tool    ASSESSMENT: Larry Huffman is a 81 y.o. male with PMH significant for PE on coumadin diagnosed Oct 2015, CAD LAD DES 2008, CKD stage III Cr baseline 1.1, who presents after a fall. He was trying to get out of bed and rolled out of bed. He has been feeling weak and tired. Has had decrease appetite. Relates nausea, having diarrhea. He was notice to have melena in the ED. He denies chest pain or dyspnea, or abdominal pain. He has not been eating ok for last few days. He has been mildly confusion, intermittent per family.   Pt admitted with GIB and melena. He just received a blood transfusion last night.   Pt very lethargic at time of visit; did not arouse to this RD's voice or during examination.   Pt is currently on a clear liquid diet; noted poor meal completion (PO: 0%). Noted two half empty cups of clear liquids at bedside. RN provided [employer] supplement this morning.   Noted wt loss trend over the past 4 months; pt has experienced a 28# (15%) wt loss over the past 6 months, per wt hx.   Nutrition-Focused physical exam completed. Findings are mild to moderate fat depletion, mild to moderate muscle depletion, and no edema.   Height:  Ht Readings from Last 1 Encounters:  01/15/15 5\' 8"  (1.727 m)    Weight:  Wt Readings from Last 1 Encounters:  01/16/15 154 lb  8.7 oz (70.1 kg)    Ideal Body Weight:  74.5 kg  Wt Readings from Last 10 Encounters:  01/16/15 154 lb 8.7 oz (70.1 kg)  09/24/14 182 lb (82.555 kg)  08/08/14 182 lb (82.555 kg)  07/29/14 182 lb 14.4 oz (82.963 kg)  06/04/14 180 lb (81.647 kg)  04/29/14 175 lb (79.379 kg)  04/13/14 185 lb (83.915 kg)  02/08/14 184 lb (83.462 kg)  02/04/14 184 lb 4.8 oz (83.598 kg)  09/14/13 184 lb 4.8 oz (83.598 kg)    BMI:  Body mass index is 23.5 kg/(m^2).  Estimated Nutritional Needs:  Kcal:  1750-1850  Protein:  85-95 grams  Fluid:  1.7-1.9 L  Skin:  Reviewed, no issues  Diet Order:  Diet clear liquid Room service appropriate?: Yes; Fluid consistency:: Thin  EDUCATION NEEDS:  No education needs identified at this time   Intake/Output Summary (Last 24 hours) at 01/16/15 1001 Last data filed at 01/16/15 1000  Gross per 24 hour  Intake 2061.33 ml  Output    550 ml  Net 1511.33 ml    Last BM:  01/15/15  Dickey Caamano A. Jimmye Norman, RD, LDN, CDE Pager: [phone] After hours Pager: [phone]

## 2015-01-17 ENCOUNTER — Ambulatory Visit: Payer: Medicare HMO | Admitting: Pharmacist Clinician (PhC)/ Clinical Pharmacy Specialist

## 2015-01-17 ENCOUNTER — Encounter (HOSPITAL_COMMUNITY): Payer: Self-pay | Admitting: Internal Medicine

## 2015-01-17 DIAGNOSIS — D699 Hemorrhagic condition, unspecified: Secondary | ICD-10-CM

## 2015-01-17 DIAGNOSIS — T45515A Adverse effect of anticoagulants, initial encounter: Secondary | ICD-10-CM

## 2015-01-17 LAB — GLUCOSE, CAPILLARY: Glucose-Capillary: 166 mg/dL — ABNORMAL HIGH (ref 65–99)

## 2015-01-17 LAB — BASIC METABOLIC PANEL
ANION GAP: 7 (ref 5–15)
BUN: 44 mg/dL — ABNORMAL HIGH (ref 6–20)
CALCIUM: 7.7 mg/dL — AB (ref 8.9–10.3)
CO2: 16 mmol/L — ABNORMAL LOW (ref 22–32)
Chloride: 114 mmol/L — ABNORMAL HIGH (ref 101–111)
Creatinine, Ser: 1.17 mg/dL (ref 0.61–1.24)
GFR, EST NON AFRICAN AMERICAN: 54 mL/min — AB (ref >60)
Glucose, Bld: 152 mg/dL — ABNORMAL HIGH (ref 65–99)
POTASSIUM: 3.5 mmol/L (ref 3.5–5.1)
SODIUM: 137 mmol/L (ref 135–145)

## 2015-01-17 LAB — CBC
HCT: 26.3 % — ABNORMAL LOW (ref 39.0–52.0)
HEMATOCRIT: 18.5 % — AB (ref 39.0–52.0)
HEMATOCRIT: 25.2 % — AB (ref 39.0–52.0)
HEMOGLOBIN: 9 g/dL — AB (ref 13.0–17.0)
Hemoglobin: 6.3 g/dL — CL (ref 13.0–17.0)
Hemoglobin: 8.8 g/dL — ABNORMAL LOW (ref 13.0–17.0)
MCH: 30.9 pg (ref 26.0–34.0)
MCH: 29.6 pg (ref 26.0–34.0)
MCH: 29.9 pg (ref 26.0–34.0)
MCHC: 34.1 g/dL (ref 30.0–36.0)
MCHC: 34.2 g/dL (ref 30.0–36.0)
MCHC: 34.9 g/dL (ref 30.0–36.0)
MCV: 90.7 fL (ref 78.0–100.0)
MCV: 86.5 fL (ref 78.0–100.0)
MCV: 85.7 fL (ref 78.0–100.0)
Platelets: 196 10*3/uL (ref 150–400)
Platelets: 158 10*3/uL (ref 150–400)
Platelets: 164 10*3/uL (ref 150–400)
RBC: 2.04 MIL/uL — AB (ref 4.22–5.81)
RBC: 3.04 MIL/uL — AB (ref 4.22–5.81)
RBC: 2.94 MIL/uL — ABNORMAL LOW (ref 4.22–5.81)
RDW: 21.3 % — ABNORMAL HIGH (ref 11.5–15.5)
RDW: 18.5 % — ABNORMAL HIGH (ref 11.5–15.5)
RDW: 19.4 % — AB (ref 11.5–15.5)
WBC: 11.4 10*3/uL — ABNORMAL HIGH (ref 4.0–10.5)
WBC: 11.9 10*3/uL — ABNORMAL HIGH (ref 4.0–10.5)
WBC: 12.4 10*3/uL — ABNORMAL HIGH (ref 4.0–10.5)

## 2015-01-17 LAB — PREPARE FRESH FROZEN PLASMA
UNIT DIVISION: 0
Unit division: 0

## 2015-01-17 LAB — PROTIME-INR
INR: 1.36 (ref 0.00–1.49)
INR: 1.25 (ref 0.00–1.49)
PROTHROMBIN TIME: 15.9 s — AB (ref 11.6–15.2)
Prothrombin Time: 16.9 seconds — ABNORMAL HIGH (ref 11.6–15.2)

## 2015-01-17 LAB — PREPARE RBC (CROSSMATCH)

## 2015-01-17 MED ORDER — SODIUM CHLORIDE 0.9 % IV SOLN: Freq: Once | INTRAVENOUS | Status: DC

## 2015-01-17 MED ORDER — PANTOPRAZOLE SODIUM 40 MG PO TBEC
40.0000 mg | DELAYED_RELEASE_TABLET | Freq: Every day | ORAL | Status: DC
  Administered 2015-01-17 – 2015-01-21 (×5): 40 mg via ORAL
  Filled 2015-01-17 (×6): qty 1

## 2015-01-17 NOTE — Progress Notes (Signed)
CRITICAL VALUE ALERT  Critical value received: Hgb 6.3  Date of notification:  01/17/15  Time of notification:  0350  Critical value read back:Yes.    Nurse who received alert:  Thurmond Butts   MD notified (1st page):  C. Withrow   Time of first page:  [phone]  Responding MD:  C. Withrow   Time MD responded:  0400

## 2015-01-17 NOTE — Progress Notes (Signed)
[facility] PROGRESS NOTE   Larry Huffman CZY:[id number] DOB: 08/05/1923 DOA: 01/15/2015 PCP: Florina Ou, MD  HPI/Subjective: Per nursing staff no bowel movements since yesterday, hemoglobin dropped again to 6.3. Transfused 2 more units of packed RBCs, await hemoglobin follow-up.  Assessment/Plan: Active Problems:   CAD - CFX DES 7/07, LAD DES 10/08, cath 11/11- medical Rx   Pulmonary embolism   Chronic kidney disease (CKD), stage III (moderate)   Chronic anticoagulation   Severe anemia   GI bleed   Acute blood loss anemia   Malnutrition of moderate degree   Warfarin-induced coagulopathy   Melena    GI bleed, melena;  Presented the hospital with melanotic stools, patient is on Coumadin. His INR 3.1 and the time of admission, given 2 units of FFP. Coumadin and Plavix held, started on IV Protonix. GI Consulted, for further evaluation.  Anemia, acute blood loss; in setting of GI bleed.  Presented with hemoglobin of 6.3, hemoglobin baseline is 9.8 from 04/20/2014. Status post transfusion of 2 units of packed RBCs, hemoglobin only improved to 7.6. Hemoglobin is 6.3 earlier today, patient received 2 units of packed RBCs already. Continue to check hemoglobin every 8 hours and transfuse if hemoglobin <7.5.  Acute on chronic renal failure. Stage III.  Creatinine baseline is 1.17, presented with creatinine of 1.49. Supportive care, IV fluids, blood transfusion, check BMP in a.m.  Diarrhea:  Check for C diff.   Metabolic acidosis; suspect multifactorial: decrease perfusion, renal failure. IV fluids.   History PE, Submassive diagnosed 04-2014. Hold coumadin in setting GI bleed.  FFP and Coumadin. Follow GI recommendation for anticoagulation.  Patient has had PE about a month ago, will discontinue Coumadin and other anticoagulation.  Fall; fall precaution. In setting of anemia. CT head negative for acute bleed.   Encephalopathy , Confusion; intermittent per family.  Supportive care. Rule out infectious process. Suspect related to acute illness. CT head negative for acute bleed.   Leukocytosis; check for C diff. UA. Will start empirically flagyl for C diff. If C diff negative ok to DC flagyl.   Code Status: DNR Family Communication: Plan discussed with the patient. Disposition Plan: Remains inpatient Diet: DIET SOFT Room service appropriate?: Yes; Fluid consistency:: Thin  Consultants:  GI  Procedures:  Transfusion of 2 units packed RBCs on 01/15/15  Transfusion of 2 units of FFP's on 01/15/2015  Transfusion of 2 units of packed RBCs on 01/17/2015  Antibiotics:  None   Objective: Filed Vitals:   01/17/15 1059  BP: 144/46  Pulse: 66  Temp: 98 F (36.7 C)  Resp: 18    Intake/Output Summary (Last 24 hours) at 01/17/15 1202 Last data filed at 01/17/15 0926  Gross per 24 hour  Intake   2636 ml  Output   1050 ml  Net   1586 ml   Filed Weights   01/15/15 1900 01/16/15 0500 01/17/15 0227  Weight: 69.1 kg (152 lb 5.4 oz) 70.1 kg (154 lb 8.7 oz) 71.1 kg (156 lb 12 oz)    Exam: General: Alert and awake, oriented x3, not in any acute distress. HEENT: anicteric sclera, pupils reactive to light and accommodation, EOMI CVS: S1-S2 clear, no murmur rubs or gallops Chest: clear to auscultation bilaterally, no wheezing, rales or rhonchi Abdomen: soft nontender, nondistended, normal bowel sounds, no organomegaly Extremities: no cyanosis, clubbing or edema noted bilaterally Neuro: Cranial nerves II-XII intact, no focal neurological deficits  Data Reviewed: Basic Metabolic Panel:  Recent Labs Lab 01/15/15 1528 01/16/15 0307 01/17/15  0248  NA 135 139 137  K 4.5 3.9 3.5  CL 109 114* 114*  CO2 16* 18* 16*  GLUCOSE 170* 161* 152*  BUN 53* 52* 44*  CREATININE 1.49* 1.36* 1.17  CALCIUM 8.3* 8.1* 7.7*   Liver Function Tests:  Recent Labs Lab 01/15/15 1528  AST 31  ALT 19  ALKPHOS 58  BILITOT 0.6  PROT 6.3*  ALBUMIN 2.8*   No  results for input(s): LIPASE, AMYLASE in the last 168 hours. No results for input(s): AMMONIA in the last 168 hours. CBC:  Recent Labs Lab 01/15/15 1528 01/16/15 0548 01/16/15 1025 01/16/15 1821 01/17/15 0248  WBC 15.3* 11.3* 10.9* 11.4* 11.4*  HGB 6.3* 7.6* 7.3* 7.3* 6.3*  HCT 18.9* 21.9* 20.8* 21.2* 18.5*  MCV 95.9 86.2 88.1 88.7 90.7  PLT 296 182 191 194 196   Cardiac Enzymes: No results for input(s): CKTOTAL, CKMB, CKMBINDEX, TROPONINI in the last 168 hours. BNP (last 3 results) No results for input(s): BNP in the last 8760 hours.  ProBNP (last 3 results)  Recent Labs  04/15/14 1450  PROBNP 729.0*    CBG: No results for input(s): GLUCAP in the last 168 hours.  Micro Recent Results (from the past 240 hour(s))  Urine culture     Status: None   Collection Time: 01/09/15 11:39 PM  Result Value Ref Range Status   Specimen Description URINE, CLEAN CATCH  Final   Special Requests Normal  Final   Culture 20,000 COLONIES/mL ENTEROCOCCUS SPECIES  Final   Report Status 01/12/2015 FINAL  Final   Organism ID, Bacteria ENTEROCOCCUS SPECIES  Final      Susceptibility   Enterococcus species - MIC*    AMPICILLIN <=2 SENSITIVE Sensitive     LEVOFLOXACIN 1 SENSITIVE Sensitive     NITROFURANTOIN <=16 SENSITIVE Sensitive     VANCOMYCIN 1 SENSITIVE Sensitive     LINEZOLID 2 SENSITIVE Sensitive     * 20,000 COLONIES/mL ENTEROCOCCUS SPECIES  MRSA PCR Screening     Status: None   Collection Time: 01/15/15  7:56 PM  Result Value Ref Range Status   MRSA by PCR NEGATIVE NEGATIVE Final    Comment:        The GeneXpert MRSA Assay (FDA approved for NASAL specimens only), is one component of a comprehensive MRSA colonization surveillance program. It is not intended to diagnose MRSA infection nor to guide or monitor treatment for MRSA infections.      Studies: Ct Head Wo Contrast  01/15/2015   CLINICAL DATA:  81 year old male status post fall with increasing confusion.  EXAM:  CT HEAD WITHOUT CONTRAST  TECHNIQUE: Contiguous axial images were obtained from the base of the skull through the vertex without intravenous contrast.  COMPARISON:  MRI dated 08/05/2005  FINDINGS: The ventricles are dilated and the sulci are prominent compatible with age-related atrophy. Periventricular and deep white matter hypodensities represent chronic microvascular ischemic changes. Focal hypodensity in the right occipital lobe medially represent chronic ischemia. There is no intracranial hemorrhage. No mass effect or midline shift identified.  The visualized paranasal sinuses and mastoid air cells are well aerated. The calvarium is intact.  IMPRESSION: No acute intracranial pathology.  Age-related atrophy and chronic microvascular ischemic disease.   Electronically Signed   By: Anner Crete M.D.   On: 01/15/2015 17:26    Scheduled Meds: . sodium chloride   Intravenous Once  . feeding supplement (RESOURCE BREEZE)  1 Container Oral TID BM  . fesoterodine  8 mg Oral Daily  .  furosemide  20 mg Intravenous Once  . montelukast  10 mg Oral QHS  . multivitamin with minerals  1 tablet Oral q morning - 10a  . pantoprazole  40 mg Oral Q0600  . simvastatin  10 mg Oral QHS  . valACYclovir  500 mg Oral BID   Continuous Infusions: . sodium chloride 50 mL/hr at 01/16/15 1600       Time spent: 35 minutes    [facility] A  [facility] Pager [phone] If 7PM-7AM, please contact night-coverage at [email or web address], password [facility] 01/17/2015, 12:02 PM  LOS: 2 days

## 2015-01-17 NOTE — Progress Notes (Signed)
RN called from unit to discuss possible transfer out of unit to telemetry floor so ED can move some patients. Unfortunately, given pt's labs, workup, and multiple workups in progress that could become emergent, pt must stay. Additionally, pt's Hgb has dropped by a full point down to 6.3. Will give 2 units of RBC's and recheck CBC.  Benjamine Mola, [place] 01/17/2015

## 2015-01-17 NOTE — Progress Notes (Signed)
Daily Rounding Note  01/17/2015, 11:19 AM  LOS: 2 days   SUBJECTIVE:      Not hungry.  Minor smears of tarry stool, passing melenic smelling gas.  No abd pain, no nausea.  Just feels tired and weak.   OBJECTIVE:         Vital signs in last 24 hours:    Temp:  [97.4 F (36.3 C)-98.6 F (37 C)] 98 F (36.7 C) (07/08 1059) Pulse Rate:  [58-79] 66 (07/08 1059) Resp:  [13-21] 18 (07/08 1059) BP: (109-182)/(39-57) 144/46 mmHg (07/08 1059) SpO2:  [92 %-100 %] 100 % (07/08 1059) Weight:  [156 lb 12 oz (71.1 kg)] 156 lb 12 oz (71.1 kg) (07/08 0227) Last BM Date: 01/15/15 Filed Weights   01/15/15 1900 01/16/15 0500 01/17/15 0227  Weight: 152 lb 5.4 oz (69.1 kg) 154 lb 8.7 oz (70.1 kg) 156 lb 12 oz (71.1 kg)   General: frail, alert, comfortable   Heart:  Monitor with sinus rhythm with pauses. Chest: clear bil.  No cough, wet vocals or dyspnea Abdomen: soft, NT, active BS, ND  Extremities: no CCE Neuro/Psych:  Pleasant, cooperative, aroused easily.  Follows commands.   Intake/Output from previous day: 07/07 0701 - 07/08 0700 In: 2522 [P.O.:840; I.V.:1050; Blood:332; IV Piggyback:300] Out: 1000 [Urine:1000]  Intake/Output this shift: Total I/O In: 484 [I.V.:150; Blood:334] Out: 300 [Urine:300]  Lab Results:  Recent Labs  01/16/15 1025 01/16/15 1821 01/17/15 0248  WBC 10.9* 11.4* 11.4*  HGB 7.3* 7.3* 6.3*  HCT 20.8* 21.2* 18.5*  PLT 191 194 196   BMET  Recent Labs  01/15/15 1528 01/16/15 0307 01/17/15 0248  NA 135 139 137  K 4.5 3.9 3.5  CL 109 114* 114*  CO2 16* 18* 16*  GLUCOSE 170* 161* 152*  BUN 53* 52* 44*  CREATININE 1.49* 1.36* 1.17  CALCIUM 8.3* 8.1* 7.7*   LFT  Recent Labs  01/15/15 1528  PROT 6.3*  ALBUMIN 2.8*  AST 31  ALT 19  ALKPHOS 58  BILITOT 0.6   PT/INR  Recent Labs  01/17/15 0248 01/17/15 1009  LABPROT 16.9* 15.9*  INR 1.36 1.25   Hepatitis Panel No results for  input(s): HEPBSAG, HCVAB, HEPAIGM, HEPBIGM in the last 72 hours.  Studies/Results: Ct Head Wo Contrast  01/15/2015   CLINICAL DATA:  81 year old male status post fall with increasing confusion.  EXAM: CT HEAD WITHOUT CONTRAST  TECHNIQUE: Contiguous axial images were obtained from the base of the skull through the vertex without intravenous contrast.  COMPARISON:  MRI dated 08/05/2005  FINDINGS: The ventricles are dilated and the sulci are prominent compatible with age-related atrophy. Periventricular and deep white matter hypodensities represent chronic microvascular ischemic changes. Focal hypodensity in the right occipital lobe medially represent chronic ischemia. There is no intracranial hemorrhage. No mass effect or midline shift identified.  The visualized paranasal sinuses and mastoid air cells are well aerated. The calvarium is intact.  IMPRESSION: No acute intracranial pathology.  Age-related atrophy and chronic microvascular ischemic disease.   Electronically Signed   By: Anner Crete M.D.   On: 01/15/2015 17:26   Scheduled Meds: . sodium chloride   Intravenous Once  . feeding supplement (RESOURCE BREEZE)  1 Container Oral TID BM  . fesoterodine  8 mg Oral Daily  . furosemide  20 mg Intravenous Once  . montelukast  10 mg Oral QHS  . multivitamin with minerals  1 tablet Oral q morning - 10a  .  pantoprazole  40 mg Oral Q0600  . simvastatin  10 mg Oral QHS  . valACYclovir  500 mg Oral BID   Continuous Infusions: . sodium chloride 50 mL/hr at 01/16/15 1600   PRN Meds:.acetaminophen **OR** acetaminophen, albuterol, HYDROcodone-acetaminophen, nitroGLYCERIN, ondansetron **OR** ondansetron (ZOFRAN) IV, traMADol  ASSESMENT:   *  GI bleed.  In setting coumadin, plavix, ASA 7/7 EGD normal. ? Colonic or distal SB lesion? Too frail for colonoscopy.  Observing off Coumadin, reversed with FFP/vit K.  OK to resume ASA and Plavix, neither yet restarted.   *  ABL on top of chronic anemia.  S/p  PRBC x 2 but Hgb back down to 6.3, 2 more units given and repeat Hgb pending.     PLAN   *  Daily Protonix.  q 12 hour CBC.     Azucena Freed  01/17/2015, 11:19 AM Pager: [phone]  GI ATTENDING  No significant GI bleeding. Seems to be passing old blood. Continue observation off Coumadin indefinitely. No significant upper GI lesions. Impression and plans as outlined in my procedure note yesterday. No further GI interventions planned. Agree with transfusing to maintain hemoglobin around 8 or greater. Continue PPI. Oral this point. Diet of choice. We will sign off. Available if needed for questions or problems.  Docia Chuck. Geri Seminole., M.D. [facility] Division of Gastroenterology

## 2015-01-18 LAB — TYPE AND SCREEN
ABO/RH(D): O POS
ANTIBODY SCREEN: NEGATIVE
UNIT DIVISION: 0
UNIT DIVISION: 0
UNIT DIVISION: 0
Unit division: 0

## 2015-01-18 LAB — CBC
HCT: 24.2 % — ABNORMAL LOW (ref 39.0–52.0)
HCT: 25.6 % — ABNORMAL LOW (ref 39.0–52.0)
Hemoglobin: 8.4 g/dL — ABNORMAL LOW (ref 13.0–17.0)
Hemoglobin: 8.8 g/dL — ABNORMAL LOW (ref 13.0–17.0)
MCH: 30.2 pg (ref 26.0–34.0)
MCH: 30.8 pg (ref 26.0–34.0)
MCHC: 34.7 g/dL (ref 30.0–36.0)
MCHC: 34.4 g/dL (ref 30.0–36.0)
MCV: 87.1 fL (ref 78.0–100.0)
MCV: 89.5 fL (ref 78.0–100.0)
Platelets: 168 K/uL (ref 150–400)
Platelets: 189 10*3/uL (ref 150–400)
RBC: 2.78 MIL/uL — ABNORMAL LOW (ref 4.22–5.81)
RBC: 2.86 MIL/uL — ABNORMAL LOW (ref 4.22–5.81)
RDW: 19.9 % — ABNORMAL HIGH (ref 11.5–15.5)
RDW: 20.8 % — AB (ref 11.5–15.5)
WBC: 9.8 K/uL (ref 4.0–10.5)
WBC: 11.5 10*3/uL — AB (ref 4.0–10.5)

## 2015-01-18 LAB — PROTIME-INR
INR: 1.32 (ref 0.00–1.49)
Prothrombin Time: 16.5 s — ABNORMAL HIGH (ref 11.6–15.2)

## 2015-01-18 NOTE — Progress Notes (Signed)
[facility] PROGRESS NOTE   Larry Huffman:[id number] DOB: [birth year] DOA: 01/15/2015 PCP: Florina Ou, MD  HPI/Subjective: Hemoglobin holding at 8.4, status post transfusion of total 4 units of packed RBCs. Patient is somewhat confused but stated he is feeling better.  Assessment/Plan: Active Problems:   CAD - CFX DES 7/07, LAD DES 10/08, cath 11/11- medical Rx   Pulmonary embolism   Chronic kidney disease (CKD), stage III (moderate)   Chronic anticoagulation   Severe anemia   GI bleed   Acute blood loss anemia   Malnutrition of moderate degree   Warfarin-induced coagulopathy   Melena    GI bleed, melena;  Presented the hospital with melanotic stools, patient is on Coumadin. His INR 3.1 and the time of admission, given 2 units of FFP. Coumadin and Plavix held, started on IV Protonix. EGD done and showed no evidence of bleeding, started on diet.  Anemia, acute blood loss; in setting of GI bleed.  Presented with hemoglobin of 6.3, hemoglobin baseline is 9.8 from 04/20/2014. Status post transfusion of 2 units of packed RBCs, hemoglobin only improved to 7.6. Hemoglobin is 6.3 earlier today, patient received 2 units of packed RBCs already. Continue to check hemoglobin every 12 hours, GI recommended to keep hemoglobin above 8.0.  Acute on chronic renal failure. Stage III.  Creatinine baseline is 1.17, presented with creatinine of 1.49. Supportive care, IV fluids, creatinine was down to 1.1. Check BMP in a.m.  Diarrhea:  C. difficile ordered, patient is not having diarrhea since admission we'll discontinue.  Metabolic acidosis; suspect multifactorial: decrease perfusion, renal failure. IV fluids.   History PE, Submassive diagnosed 04-2014. Hold coumadin in setting GI bleed.  FFP and Coumadin. Follow GI recommendation for anticoagulation.  Patient has had PE about a month ago, will discontinue Coumadin and other anticoagulation.  Fall; fall precaution. In  setting of anemia. CT head negative for acute bleed.   Encephalopathy , Confusion; intermittent per family. Supportive care. Rule out infectious process. Suspect related to acute illness. CT head negative for acute bleed.   Leukocytosis; check for C diff. UA. Will start empirically flagyl for C diff, patient is not having diarrhea will discontinue Flagyl.  Code Status: DNR Family Communication: Plan discussed with the patient. Disposition Plan: Remains inpatient Diet: DIET SOFT Room service appropriate?: Yes; Fluid consistency:: Thin  Consultants:  GI  Procedures:  Transfusion of 2 units packed RBCs on 01/15/15  Transfusion of 2 units of FFP's on 01/15/2015  Transfusion of 2 units of packed RBCs on 01/17/2015  Antibiotics:  None   Objective: Filed Vitals:   01/18/15 0747  BP:   Pulse: 58  Temp: 97.6 F (36.4 C)  Resp: 14    Intake/Output Summary (Last 24 hours) at 01/18/15 1001 Last data filed at 01/18/15 0600  Gross per 24 hour  Intake   1605 ml  Output    900 ml  Net    705 ml   Filed Weights   01/16/15 0500 01/17/15 0227 01/18/15 0500  Weight: 70.1 kg (154 lb 8.7 oz) 71.1 kg (156 lb 12 oz) 71.6 kg (157 lb 13.6 oz)    Exam: General: Alert and awake, oriented x3, not in any acute distress. HEENT: anicteric sclera, pupils reactive to light and accommodation, EOMI CVS: S1-S2 clear, no murmur rubs or gallops Chest: clear to auscultation bilaterally, no wheezing, rales or rhonchi Abdomen: soft nontender, nondistended, normal bowel sounds, no organomegaly Extremities: no cyanosis, clubbing or edema noted bilaterally Neuro: Cranial nerves II-XII  intact, no focal neurological deficits  Data Reviewed: Basic Metabolic Panel:  Recent Labs Lab 01/15/15 1528 01/16/15 0307 01/17/15 0248  NA 135 139 137  K 4.5 3.9 3.5  CL 109 114* 114*  CO2 16* 18* 16*  GLUCOSE 170* 161* 152*  BUN 53* 52* 44*  CREATININE 1.49* 1.36* 1.17  CALCIUM 8.3* 8.1* 7.7*   Liver  Function Tests:  Recent Labs Lab 01/15/15 1528  AST 31  ALT 19  ALKPHOS 58  BILITOT 0.6  PROT 6.3*  ALBUMIN 2.8*   No results for input(s): LIPASE, AMYLASE in the last 168 hours. No results for input(s): AMMONIA in the last 168 hours. CBC:  Recent Labs Lab 01/16/15 1821 01/17/15 0248 01/17/15 1132 01/17/15 1843 01/18/15 0250  WBC 11.4* 11.4* 11.9* 12.4* 9.8  HGB 7.3* 6.3* 9.0* 8.8* 8.4*  HCT 21.2* 18.5* 26.3* 25.2* 24.2*  MCV 88.7 90.7 86.5 85.7 87.1  PLT 194 196 158 164 168   Cardiac Enzymes: No results for input(s): CKTOTAL, CKMB, CKMBINDEX, TROPONINI in the last 168 hours. BNP (last 3 results) No results for input(s): BNP in the last 8760 hours.  ProBNP (last 3 results)  Recent Labs  04/15/14 1450  PROBNP 729.0*    CBG:  Recent Labs Lab 01/17/15 1057  GLUCAP 166*    Micro Recent Results (from the past 240 hour(s))  Urine culture     Status: None   Collection Time: 01/09/15 11:39 PM  Result Value Ref Range Status   Specimen Description URINE, CLEAN CATCH  Final   Special Requests Normal  Final   Culture 20,000 COLONIES/mL ENTEROCOCCUS SPECIES  Final   Report Status 01/12/2015 FINAL  Final   Organism ID, Bacteria ENTEROCOCCUS SPECIES  Final      Susceptibility   Enterococcus species - MIC*    AMPICILLIN <=2 SENSITIVE Sensitive     LEVOFLOXACIN 1 SENSITIVE Sensitive     NITROFURANTOIN <=16 SENSITIVE Sensitive     VANCOMYCIN 1 SENSITIVE Sensitive     LINEZOLID 2 SENSITIVE Sensitive     * 20,000 COLONIES/mL ENTEROCOCCUS SPECIES  MRSA PCR Screening     Status: None   Collection Time: 01/15/15  7:56 PM  Result Value Ref Range Status   MRSA by PCR NEGATIVE NEGATIVE Final    Comment:        The GeneXpert MRSA Assay (FDA approved for NASAL specimens only), is one component of a comprehensive MRSA colonization surveillance program. It is not intended to diagnose MRSA infection nor to guide or monitor treatment for MRSA infections.       Studies: No results found.  Scheduled Meds: . sodium chloride   Intravenous Once  . feeding supplement (RESOURCE BREEZE)  1 Container Oral TID BM  . fesoterodine  8 mg Oral Daily  . furosemide  20 mg Intravenous Once  . montelukast  10 mg Oral QHS  . multivitamin with minerals  1 tablet Oral q morning - 10a  . pantoprazole  40 mg Oral Q0600  . simvastatin  10 mg Oral QHS  . valACYclovir  500 mg Oral BID   Continuous Infusions: . sodium chloride 50 mL/hr at 01/18/15 0200       Time spent: 35 minutes    [facility] A  [facility] Pager [phone] If 7PM-7AM, please contact night-coverage at [email or web address], password [facility] 01/18/2015, 10:01 AM  LOS: 3 days

## 2015-01-19 LAB — BASIC METABOLIC PANEL
Anion gap: 6 (ref 5–15)
BUN: 26 mg/dL — ABNORMAL HIGH (ref 6–20)
CALCIUM: 7.7 mg/dL — AB (ref 8.9–10.3)
CO2: 16 mmol/L — AB (ref 22–32)
Chloride: 117 mmol/L — ABNORMAL HIGH (ref 101–111)
Creatinine, Ser: 0.92 mg/dL (ref 0.61–1.24)
GFR calc Af Amer: >60 mL/min (ref >60)
GFR calc non Af Amer: >60 mL/min (ref >60)
GLUCOSE: 131 mg/dL — AB (ref 65–99)
Potassium: 3.4 mmol/L — ABNORMAL LOW (ref 3.5–5.1)
Sodium: 139 mmol/L (ref 135–145)

## 2015-01-19 LAB — CBC
HEMATOCRIT: 24.6 % — AB (ref 39.0–52.0)
Hemoglobin: 8.4 g/dL — ABNORMAL LOW (ref 13.0–17.0)
MCH: 30.7 pg (ref 26.0–34.0)
MCHC: 34.1 g/dL (ref 30.0–36.0)
MCV: 89.8 fL (ref 78.0–100.0)
Platelets: 174 10*3/uL (ref 150–400)
RBC: 2.74 MIL/uL — ABNORMAL LOW (ref 4.22–5.81)
RDW: 21.4 % — ABNORMAL HIGH (ref 11.5–15.5)
WBC: 10.9 10*3/uL — ABNORMAL HIGH (ref 4.0–10.5)

## 2015-01-19 LAB — PROTIME-INR
INR: 1.35 (ref 0.00–1.49)
Prothrombin Time: 16.8 seconds — ABNORMAL HIGH (ref 11.6–15.2)

## 2015-01-19 MED ORDER — POTASSIUM CHLORIDE CRYS ER 20 MEQ PO TBCR
40.0000 meq | EXTENDED_RELEASE_TABLET | Freq: Once | ORAL | Status: AC
  Administered 2015-01-19: 40 meq via ORAL

## 2015-01-19 NOTE — Evaluation (Signed)
Physical Therapy Evaluation Patient Details Name: Larry Huffman MRN: [id number] DOB: 1923-12-01 Today's Date: 01/19/2015   History of Present Illness  Pt is an 81 y/o male with a PMH signficant for PE, CAD LAD DES 2008, CKD III, who presents after a fall. He was trying to get out of bed and rolled out of the bed. He states he has been feeling weak and tired. Intermittent mild confusion per family. Pt was admitted for GI bleed.  Clinical Impression  Pt admitted with above diagnosis. Pt currently with functional limitations due to the deficits listed below (see PT Problem List). At the time of PT eval pt was able to perform transfers with +2 min assist for balance and safety. As pt has had multiple falls PTA, and currently unsure if family will be available 24 hours at d/c, recommending SNF for continued skilled PT services at d/c. Feel pt could benefit from increased frequency of therapy to improve strength, balance, and tolerance for functional activity prior to return home. Pt will benefit from skilled PT to increase their independence and safety with mobility to allow discharge to the venue listed below.       Follow Up Recommendations SNF;Supervision/Assistance - 24 hour    Equipment Recommendations  Rolling walker with 5" wheels    Recommendations for Other Services       Precautions / Restrictions Precautions Precautions: Fall Precaution Comments: 2 falls PTA Restrictions Weight Bearing Restrictions: No      Mobility  Bed Mobility Overal bed mobility: Needs Assistance Bed Mobility: Supine to Sit     Supine to sit: Min assist     General bed mobility comments: Assist to transition to EOB. Increased cueing to initiate LE movement, and physical assistance required for trunk elevation to full sitting.   Transfers Overall transfer level: Needs assistance Equipment used: Rolling walker (2 wheeled) Transfers: Sit to/from [facility] Sit to Stand: Min assist;+2  physical assistance Stand pivot transfers: Min assist;+2 safety/equipment       General transfer comment: Assist to power-up to full standing position. Pt was able to take a few pivotal steps around to the recliner. +2 for physical assist initially, and then for safety after pt gained his standing balance.   Ambulation/Gait             General Gait Details: Further mobility deferred at this time due to pt fatigue and confusion.   Stairs            Wheelchair Mobility    Modified Rankin (Stroke Patients Only)       Balance Overall balance assessment: Needs assistance Sitting-balance support: Feet supported;No upper extremity supported Sitting balance-Leahy Scale: Fair     Standing balance support: Bilateral upper extremity supported;During functional activity Standing balance-Leahy Scale: Poor Standing balance comment: Pt required UE support and/or hands-on assist for dynamic standing balance.                              Pertinent Vitals/Pain Pain Assessment: No/denies pain    Home Living Family/patient expects to be discharged to:: Private residence Living Arrangements: Children               Additional Comments: Pt confused at the time of eval and states he lives alone. Unsure details of home environment or if he has 24 hour assistance available.     Prior Function Level of Independence: Independent  Comments: Pt reports he was independent with ADL's, driving, and not using an AD PTA. Again, pt was confused and unsure how accurate this info is.      Hand Dominance   Dominant Hand: Right    Extremity/Trunk Assessment   Upper Extremity Assessment: Defer to OT evaluation           Lower Extremity Assessment: Generalized weakness      Cervical / Trunk Assessment: Kyphotic;Other exceptions  Communication   Communication: HOH  Cognition Arousal/Alertness: Awake/alert Behavior During Therapy: WFL for tasks  assessed/performed Overall Cognitive Status: Impaired/Different from baseline Area of Impairment: Orientation;Memory Orientation Level: Disoriented to;Time;Situation   Memory: Decreased short-term memory              General Comments      Exercises        Assessment/Plan    PT Assessment Patient needs continued PT services  PT Diagnosis Difficulty walking;Generalized weakness   PT Problem List Decreased strength;Decreased range of motion;Decreased activity tolerance;Decreased balance;Decreased mobility;Decreased knowledge of use of DME;Decreased safety awareness;Decreased knowledge of precautions  PT Treatment Interventions DME instruction;Gait training;Stair training;Functional mobility training;Therapeutic activities;Therapeutic exercise;Neuromuscular re-education;Patient/family education   PT Goals (Current goals can be found in the Care Plan section) Acute Rehab PT Goals Patient Stated Goal: Pt did not state goals during session.  PT Goal Formulation: With patient Time For Goal Achievement: 01/26/15 Potential to Achieve Goals: Good    Frequency Min 3X/week   Barriers to discharge Decreased caregiver support Unsure if family is available 24 hours    Co-evaluation               End of Session Equipment Utilized During Treatment: Gait belt Activity Tolerance: Patient limited by fatigue Patient left: in chair;with chair alarm set;with call bell/phone within reach;with nursing/sitter in room Nurse Communication: Mobility status         Time: 0912-0933 PT Time Calculation (min) (ACUTE ONLY): 21 min   Charges:   PT Evaluation $Initial PT Evaluation Tier I: 1 Procedure     PT G CodesRolinda Roan 06-Feb-2015, 10:20 AM   Rolinda Roan, PT, DPT Acute Rehabilitation Services Pager: [phone]

## 2015-01-19 NOTE — Progress Notes (Signed)
Pt has had constant incontinence with severely irritated skin around genitals.  Pt consistently pulling off condom cath and states he is unable to urinate everything in his bladder.  MD was notified and order was placed for foley catheter insertion.  Pt initially produced 400 ml of urine following F/C insertion.  Pt had just voided prior to insertion.  Will continue to monitor.

## 2015-01-19 NOTE — Progress Notes (Signed)
[facility] PROGRESS NOTE   Larry Huffman YKD:[id number] DOB: Mar 25, 1924 DOA: 01/15/2015 PCP: Florina Ou, MD  HPI/Subjective: Hemoglobin is stable at 8.4, denies any bleeding. Sitting at bedside, ready to eat his lunch.  Assessment/Plan: Active Problems:   CAD - CFX DES 7/07, LAD DES 10/08, cath 11/11- medical Rx   Pulmonary embolism   Chronic kidney disease (CKD), stage III (moderate)   Chronic anticoagulation   Severe anemia   GI bleed   Acute blood loss anemia   Malnutrition of moderate degree   Warfarin-induced coagulopathy   Melena    GI bleed, melena;  Presented the hospital with melanotic stools, patient is on Coumadin. His INR 3.1 and the time of admission, given 2 units of FFP. Coumadin and Plavix held, started on IV Protonix. EGD done and showed no evidence of bleeding, started on diet.  Anemia, acute blood loss; in setting of GI bleed.  Presented with hemoglobin of 6.3, hemoglobin baseline is 9.8 from 04/20/2014. Status post transfusion of 2 units of packed RBCs, hemoglobin only improved to 7.6. Hemoglobin is 6.3 earlier today, patient received 2 units of packed RBCs already. Check hemoglobin in the morning, transfuse if hemoglobin less than 8.0.  Acute on chronic renal failure. Stage III.  Creatinine baseline is 1.17, presented with creatinine of 1.49. Supportive care, IV fluids, creatinine was down to 1.1. Check BMP in a.m.  Diarrhea:  C. difficile ordered, patient is not having diarrhea since admission we'll discontinue.  Metabolic acidosis; suspect multifactorial: decrease perfusion, renal failure. IV fluids.   History PE, Submassive diagnosed 04-2014. Hold coumadin in setting GI bleed.  FFP and Coumadin. Follow GI recommendation for anticoagulation.  Patient has had PE about a month ago, will discontinue Coumadin and other anticoagulation.  Fall; fall precaution. In setting of anemia. CT head negative for acute bleed.   Encephalopathy ,  Confusion; intermittent per family. Supportive care. Rule out infectious process. Suspect related to acute illness. CT head negative for acute bleed.   Leukocytosis; check for C diff. UA. Will start empirically flagyl for C diff, patient is not having diarrhea will discontinue Flagyl.  Code Status: DNR Family Communication: Plan discussed with the patient. Disposition Plan: Remains inpatient Diet: DIET SOFT Room service appropriate?: Yes; Fluid consistency:: Thin  Consultants:  GI  Procedures:  Transfusion of 2 units packed RBCs on 01/15/15  Transfusion of 2 units of FFP's on 01/15/2015  Transfusion of 2 units of packed RBCs on 01/17/2015  Antibiotics:  None   Objective: Filed Vitals:   01/19/15 0500  BP: 125/47  Pulse: 68  Temp: 98.9 F (37.2 C)  Resp: 18    Intake/Output Summary (Last 24 hours) at 01/19/15 1208 Last data filed at 01/19/15 0501  Gross per 24 hour  Intake      0 ml  Output    950 ml  Net   -950 ml   Filed Weights   01/16/15 0500 01/17/15 0227 01/18/15 0500  Weight: 70.1 kg (154 lb 8.7 oz) 71.1 kg (156 lb 12 oz) 71.6 kg (157 lb 13.6 oz)    Exam: General: Alert and awake, oriented x3, not in any acute distress. HEENT: anicteric sclera, pupils reactive to light and accommodation, EOMI CVS: S1-S2 clear, no murmur rubs or gallops Chest: clear to auscultation bilaterally, no wheezing, rales or rhonchi Abdomen: soft nontender, nondistended, normal bowel sounds, no organomegaly Extremities: no cyanosis, clubbing or edema noted bilaterally Neuro: Cranial nerves II-XII intact, no focal neurological deficits  Data Reviewed: Basic  Metabolic Panel:  Recent Labs Lab 01/15/15 1528 01/16/15 0307 01/17/15 0248 01/19/15 0322  NA 135 139 137 139  K 4.5 3.9 3.5 3.4*  CL 109 114* 114* 117*  CO2 16* 18* 16* 16*  GLUCOSE 170* 161* 152* 131*  BUN 53* 52* 44* 26*  CREATININE 1.49* 1.36* 1.17 0.92  CALCIUM 8.3* 8.1* 7.7* 7.7*   Liver Function  Tests:  Recent Labs Lab 01/15/15 1528  AST 31  ALT 19  ALKPHOS 58  BILITOT 0.6  PROT 6.3*  ALBUMIN 2.8*   No results for input(s): LIPASE, AMYLASE in the last 168 hours. No results for input(s): AMMONIA in the last 168 hours. CBC:  Recent Labs Lab 01/17/15 1132 01/17/15 1843 01/18/15 0250 01/18/15 1725 01/19/15 0322  WBC 11.9* 12.4* 9.8 11.5* 10.9*  HGB 9.0* 8.8* 8.4* 8.8* 8.4*  HCT 26.3* 25.2* 24.2* 25.6* 24.6*  MCV 86.5 85.7 87.1 89.5 89.8  PLT 158 164 168 189 174   Cardiac Enzymes: No results for input(s): CKTOTAL, CKMB, CKMBINDEX, TROPONINI in the last 168 hours. BNP (last 3 results) No results for input(s): BNP in the last 8760 hours.  ProBNP (last 3 results)  Recent Labs  04/15/14 1450  PROBNP 729.0*    CBG:  Recent Labs Lab 01/17/15 1057  GLUCAP 166*    Micro Recent Results (from the past 240 hour(s))  Urine culture     Status: None   Collection Time: 01/09/15 11:39 PM  Result Value Ref Range Status   Specimen Description URINE, CLEAN CATCH  Final   Special Requests Normal  Final   Culture 20,000 COLONIES/mL ENTEROCOCCUS SPECIES  Final   Report Status 01/12/2015 FINAL  Final   Organism ID, Bacteria ENTEROCOCCUS SPECIES  Final      Susceptibility   Enterococcus species - MIC*    AMPICILLIN <=2 SENSITIVE Sensitive     LEVOFLOXACIN 1 SENSITIVE Sensitive     NITROFURANTOIN <=16 SENSITIVE Sensitive     VANCOMYCIN 1 SENSITIVE Sensitive     LINEZOLID 2 SENSITIVE Sensitive     * 20,000 COLONIES/mL ENTEROCOCCUS SPECIES  MRSA PCR Screening     Status: None   Collection Time: 01/15/15  7:56 PM  Result Value Ref Range Status   MRSA by PCR NEGATIVE NEGATIVE Final    Comment:        The GeneXpert MRSA Assay (FDA approved for NASAL specimens only), is one component of a comprehensive MRSA colonization surveillance program. It is not intended to diagnose MRSA infection nor to guide or monitor treatment for MRSA infections.      Studies: No  results found.  Scheduled Meds: . feeding supplement (RESOURCE BREEZE)  1 Container Oral TID BM  . fesoterodine  8 mg Oral Daily  . furosemide  20 mg Intravenous Once  . montelukast  10 mg Oral QHS  . multivitamin with minerals  1 tablet Oral q morning - 10a  . pantoprazole  40 mg Oral Q0600  . simvastatin  10 mg Oral QHS  . valACYclovir  500 mg Oral BID   Continuous Infusions:       Time spent: 35 minutes    [facility] A  [facility] Pager [phone] If 7PM-7AM, please contact night-coverage at [email or web address], password [facility] 01/19/2015, 12:08 PM  LOS: 4 days

## 2015-01-20 LAB — BASIC METABOLIC PANEL
ANION GAP: 7 (ref 5–15)
BUN: 26 mg/dL — ABNORMAL HIGH (ref 6–20)
CHLORIDE: 117 mmol/L — AB (ref 101–111)
CO2: 16 mmol/L — AB (ref 22–32)
CREATININE: 0.97 mg/dL (ref 0.61–1.24)
Calcium: 7.8 mg/dL — ABNORMAL LOW (ref 8.9–10.3)
GFR calc non Af Amer: >60 mL/min (ref >60)
GLUCOSE: 104 mg/dL — AB (ref 65–99)
POTASSIUM: 3.7 mmol/L (ref 3.5–5.1)
SODIUM: 140 mmol/L (ref 135–145)

## 2015-01-20 LAB — CBC
HEMATOCRIT: 25.3 % — AB (ref 39.0–52.0)
HEMOGLOBIN: 8.5 g/dL — AB (ref 13.0–17.0)
MCH: 31 pg (ref 26.0–34.0)
MCHC: 33.6 g/dL (ref 30.0–36.0)
MCV: 92.3 fL (ref 78.0–100.0)
PLATELETS: 191 10*3/uL (ref 150–400)
RBC: 2.74 MIL/uL — ABNORMAL LOW (ref 4.22–5.81)
RDW: 22.1 % — AB (ref 11.5–15.5)
WBC: 10.6 10*3/uL — ABNORMAL HIGH (ref 4.0–10.5)

## 2015-01-20 LAB — PROTIME-INR
INR: 1.27 (ref 0.00–1.49)
PROTHROMBIN TIME: 16.1 s — AB (ref 11.6–15.2)

## 2015-01-20 NOTE — Care Management (Signed)
Important Message  Patient Details  Name: Larry Huffman MRN: [id number] Date of Birth: September 10, 1923   Medicare Important Message Given:  Yes-second notification given    Nathen May 01/20/2015, 2:11 PM

## 2015-01-20 NOTE — Clinical Social Work Placement (Signed)
   CLINICAL SOCIAL WORK PLACEMENT  NOTE  Date:  01/20/2015  Patient Details  Name: CAELLUM MANCIL MRN: [id number] Date of Birth: 1923-11-10  Clinical Social Work is seeking post-discharge placement for this patient at the [place] level of care (*CSW will initial, date and re-position this form in  chart as items are completed):  Yes   Patient/family provided with [place] Work Department's list of facilities offering this level of care within the geographic area requested by the patient (or if unable, by the patient's family).  Yes   Patient/family informed of their freedom to choose among providers that offer the needed level of care, that participate in Medicare, Medicaid or managed care program needed by the patient, have an available bed and are willing to accept the patient.  Yes   Patient/family informed of [facility]'s ownership interest in [facility] and [facility], as well as of the fact that they are under no obligation to receive care at these facilities.  PASRR submitted to EDS on 01/20/15     PASRR number received on 01/20/15     Existing PASRR number confirmed on       FL2 transmitted to all facilities in geographic area requested by pt/family on 01/20/15     FL2 transmitted to all facilities within larger geographic area on       Patient informed that his/her managed care company has contracts with or will negotiate with certain facilities, including the following:            Patient/family informed of bed offers received.  Patient chooses bed at       Physician recommends and patient chooses bed at      Patient to be transferred to   on  .  Patient to be transferred to facility by       Patient family notified on   of transfer.  Name of family member notified:        PHYSICIAN Please sign DNR, Please sign FL2     Additional Comment:    _______________________________________________ Cranford Mon,  LCSW 01/20/2015, 11:54 AM

## 2015-01-20 NOTE — Evaluation (Addendum)
Occupational Therapy Evaluation Patient Details Name: Larry Huffman MRN: [id number] DOB: 02-09-1924 Today's Date: 01/20/2015    History of Present Illness Pt is an 81 y.o. male with a PMH signficant for PE, CAD LAD DES 2008, CKD III, who presents after a fall. He was trying to get out of bed and rolled out of the bed. He states he has been feeling weak and tired. Intermittent mild confusion per family. Pt was admitted for GI bleed.   Clinical Impression   Pt admitted with above. Unsure of pt's PLOF, as family not present in session. Feel pt will benefit from acute OT to increase independence prior to d/c. Recommending SNF for rehab.     Follow Up Recommendations  SNF;Supervision/Assistance - 24 hour    Equipment Recommendations  Other (comment) (defer to next venue)    Recommendations for Other Services       Precautions / Restrictions Precautions Precautions: Fall Precaution Comments: 2 falls PTA Restrictions Weight Bearing Restrictions: No      Mobility Bed Mobility Overal bed mobility: Needs Assistance Bed Mobility: Supine to Sit;Sit to Supine     Supine to sit: Min assist Sit to supine: Supervision   General bed mobility comments: assist with trunk to come to sitting position. Cues for technique for bed mobility. Trendelenburg position used to scoot HOB and cues/ assist given.   Transfers Overall transfer level: Needs assistance Equipment used: Rolling walker (2 wheeled) Transfers: Sit to/from Stand Sit to Stand: Min guard         General transfer comment: cues for hand placement. Pt reported dizziness upon standing.    Balance  No LOB sitting EOB and no LOB standing with RW. History of falls.                                          ADL Overall ADL's : Needs assistance/impaired     Grooming: Wash/dry face;Oral care;Applying deodorant;Minimal assistance;Sitting Grooming Details (indicate cue type and reason): OT moved gown so pt  could apply deodorant. Upper Body Bathing: Set up;Supervision/ safety;Sitting   Lower Body Bathing: Minimal assistance;Sit to/from stand;Moderate assistance   Upper Body Dressing : Minimal assistance;Sitting   Lower Body Dressing: Minimal assistance;Sit to/from stand;Moderate assistance   Toilet Transfer: Min guard;RW (sit to stand from bed)             General ADL Comments: Pt able to don/doff socks with Min assist sitting EOB. Pt lethargic in session.     Vision     Perception     Praxis      Pertinent Vitals/Pain Pain Assessment: Faces Faces Pain Scale: Hurts even more Pain Location: RUE Pain Descriptors / Indicators: Grimacing;Other (Comment) (reported pain when OT touched it) Pain Intervention(s): Repositioned;Monitored during session     Hand Dominance Right   Extremity/Trunk Assessment Upper Extremity Assessment Upper Extremity Assessment: RUE deficits/detail;Generalized weakness RUE Deficits / Details: edema in right hand   Lower Extremity Assessment Lower Extremity Assessment: Defer to PT evaluation       Communication Communication Communication: HOH   Cognition Arousal/Alertness: Lethargic;Suspect due to medications Behavior During Therapy: [facility] for tasks assessed/performed Overall Cognitive Status: No family/caregiver present to determine baseline cognitive functioning Area of Impairment: Orientation;Problem solving Orientation Level: Disoriented to;Time;Situation           Problem Solving: Slow processing;Requires verbal cues;Requires tactile cues;Difficulty sequencing  General Comments       Exercises       Shoulder Instructions      Home Living Family/patient expects to be discharged to:: Private residence Living Arrangements: Children   Type of Home: House Home Access: Ramped entrance     [place]: Two level;Able to live on main level with bedroom/bathroom     Bathroom Shower/Tub: Occupational psychologist:  Standard     Home Equipment: Clinical cytogeneticist - 2 wheels;Bedside commode   Additional Comments: Some information taken from previous admission in chair      Prior Functioning/Environment Level of Independence: Independent        Comments: Per PT eval, Pt reports he was independent with ADL's, driving, and not using an AD PTA. Again, pt was confused and unsure how accurate this info is.     OT Diagnosis: Acute pain;Altered mental status;Generalized weakness   OT Problem List: Decreased strength;Decreased activity tolerance;Decreased cognition;Decreased knowledge of use of DME or AE;Decreased knowledge of precautions;Decreased safety awareness;Pain;Increased edema   OT Treatment/Interventions: Self-care/ADL training;DME and/or AE instruction;Therapeutic activities;Patient/family education;Balance training;Cognitive remediation/compensation;Therapeutic exercise    OT Goals(Current goals can be found in the care plan section) Acute Rehab OT Goals Patient Stated Goal: not stated; wanted to brush his teeth OT Goal Formulation: With patient Time For Goal Achievement: 01/27/15 Potential to Achieve Goals: Good ADL Goals Pt Will Perform Lower Body Bathing: with set-up;sit to/from stand;with supervision Pt Will Perform Lower Body Dressing: with set-up;with supervision;sit to/from stand Pt Will Transfer to Toilet: with min guard assist;bedside commode;ambulating;stand pivot transfer Pt Will Perform Toileting - Clothing Manipulation and hygiene: with supervision;with set-up;sit to/from stand  OT Frequency: Min 2X/week   Barriers to D/C:            Co-evaluation              End of Session Equipment Utilized During Treatment: Gait belt;Rolling walker Nurse Communication: Mobility status  Activity Tolerance: Patient limited by lethargy Patient left: in bed;with call bell/phone within reach;with nursing/sitter in room;with bed alarm set; SCDs reapplied   Time: 0786-7544 OT  Time Calculation (min): 15 min Charges:  OT General Charges $OT Visit: 1 Procedure OT Evaluation $Initial OT Evaluation Tier I: 1 Procedure G-CodesBenito Mccreedy OTR/L [id number] 01/20/2015, 10:29 AM

## 2015-01-20 NOTE — Progress Notes (Signed)
CSW consulted for SNF placement- spoke with pt daughter who is agreeable to SNF for short term rehab.  CSW initiated bed search.  Full assessment to follow.  Domenica Reamer, [place] Social Worker [phone]

## 2015-01-20 NOTE — Clinical Social Work Note (Signed)
Clinical Social Work Assessment  Patient Details  Name: Larry Huffman MRN: [id number] Date of Birth: 1923-09-14  Date of referral:  01/20/15               Reason for consult:  Facility Placement                Permission sought to share information with:  Family Supports, Chartered certified accountant granted to share information::  Yes, Hospital doctor (verbal permission from pt daughter, Helene Kelp)  Name::     Helene Kelp  Agency::  [facility]  Relationship::  daughter  Sport and exercise psychologist Information:     Housing/Transportation Living arrangements for the past 2 months:  Single Family Home Source of Information:  Adult Children Patient Interpreter Needed:  None Criminal Activity/Legal Involvement Pertinent to Current Situation/Hospitalization:    Significant Relationships:  Adult Children Lives with:  Self Do you feel safe going back to the place where you live?  No Need for family participation in patient care:  Yes (Comment)  Care giving concerns:  Pt lives at home alone- has a daughter who works and has a Actuary 4 hours a day who helps pt with cooking/cleaning etc but does not provide much physical assistance   Facilities manager / plan:  CSW spoke with pt daughter, Helene Kelp, about short term rehab until pt is strong enough to return home alone  Employment status:  Retired Forensic scientist:  Programmer, applications PT Recommendations:  [place], [place] / Referral to community resources:  [place]  Patient/Family's Response to care:  Pt daughter does not think that pt can be at home alone right now and states that there is not 24 hour assistance/supervision available for the patient at home  Patient/Family's Understanding of and Emotional Response to Diagnosis, Current Treatment, and Prognosis:  Patients daughter did not express any questions or concerns about the pt current treatment prognosis and is  hopeful that patient will recover quickly and be able to return home  Emotional Assessment Appearance:  Appears stated age Attitude/Demeanor/Rapport:  Unable to Assess Affect (typically observed):  Unable to Assess Orientation:  Oriented to Self, Oriented to Place, Oriented to Situation Alcohol / Substance use:  Not Applicable Psych involvement (Current and /or in the community):  No (Comment)  Discharge Needs  Concerns to be addressed:  Home Safety Concerns Readmission within the last 30 days:  No Current discharge risk:  Physical Impairment Barriers to Discharge:  Continued Medical Work up, No SNF bed, [place], Ladajah Soltys M, [place] 01/20/2015, 11:51 AM

## 2015-01-20 NOTE — Progress Notes (Signed)
[facility] PROGRESS NOTE   Larry Huffman WYO:[id number] DOB: Jun 07, 1924 DOA: 01/15/2015 PCP: Florina Ou, MD  HPI/Subjective: Patient is sleepy, denies any major complaints. Seen by PT/OT and recommended SNF placement.  Assessment/Plan: Active Problems:   CAD - CFX DES 7/07, LAD DES 10/08, cath 11/11- medical Rx   Pulmonary embolism   Chronic kidney disease (CKD), stage III (moderate)   Chronic anticoagulation   Severe anemia   GI bleed   Acute blood loss anemia   Malnutrition of moderate degree   Warfarin-induced coagulopathy   Melena    GI bleed, melena;  Presented the hospital with melanotic stools, patient is on Coumadin. His INR 3.1 and the time of admission, given 2 units of FFP. Coumadin and Plavix held, started on IV Protonix. Per GI if needed okay for aspirin and Plavix. We'll continue to hold still the day of discharge. EGD done and showed no evidence of bleeding, started on diet, tolerated very well.  Anemia, acute blood loss; in setting of GI bleed.  Presented with hemoglobin of 6.3, hemoglobin baseline is 9.8 from 04/20/2014. Status post transfusion of 2 units of packed RBCs, hemoglobin only improved to 7.6. Hemoglobin was 6.3, on 7/8 morning, ended up with transfusion of 2 more units of packed RBCs. Total transfusion of 4 units of packed RBCs, currently the hemoglobin stable around 8.5.  Acute on chronic renal failure. Stage III.  Creatinine baseline is 1.17, presented with creatinine of 1.49. Supportive care, IV fluids, creatinine is down to 0.9.  Diarrhea:  C. difficile ordered, patient is not having diarrhea since admission we'll discontinue.  Metabolic acidosis; suspect multifactorial: decrease perfusion, renal failure, was on IV fluids.  History PE, Submassive diagnosed 04-2014. Hold coumadin in setting GI bleed.  FFP and Coumadin. Follow GI recommendation for anticoagulation.  Patient has had PE about a month ago, will discontinue  Coumadin and other anticoagulation.  Fall; fall precaution. In setting of anemia. CT head negative for acute bleed.   Encephalopathy , Confusion; intermittent per family. Supportive care.  Likely acute delirium, no UTI or other infection, patient is awake, alert and oriented 2.  Leukocytosis; check for C diff. UA. Will start empirically flagyl for C diff, patient is not having diarrhea will discontinue Flagyl.  Code Status: DNR Family Communication: Plan discussed with the patient. Disposition Plan: Remains inpatient Diet: DIET SOFT Room service appropriate?: Yes; Fluid consistency:: Thin  Consultants:  GI  Procedures:  Transfusion of 2 units packed RBCs on 01/15/15  Transfusion of 2 units of FFP's on 01/15/2015  Transfusion of 2 units of packed RBCs on 01/17/2015  Antibiotics:  None   Objective: Filed Vitals:   01/20/15 0515  BP: 136/46  Pulse: 64  Temp: 98 F (36.7 C)  Resp: 17    Intake/Output Summary (Last 24 hours) at 01/20/15 1113 Last data filed at 01/20/15 0900  Gross per 24 hour  Intake    120 ml  Output    625 ml  Net   -505 ml   Filed Weights   01/17/15 0227 01/18/15 0500 01/20/15 0525  Weight: 71.1 kg (156 lb 12 oz) 71.6 kg (157 lb 13.6 oz) 71.7 kg (158 lb 1.1 oz)    Exam: General: Alert and awake, oriented x3, not in any acute distress. HEENT: anicteric sclera, pupils reactive to light and accommodation, EOMI CVS: S1-S2 clear, no murmur rubs or gallops Chest: clear to auscultation bilaterally, no wheezing, rales or rhonchi Abdomen: soft nontender, nondistended, normal bowel sounds, no organomegaly  Extremities: no cyanosis, clubbing or edema noted bilaterally Neuro: Cranial nerves II-XII intact, no focal neurological deficits  Data Reviewed: Basic Metabolic Panel:  Recent Labs Lab 01/15/15 1528 01/16/15 0307 01/17/15 0248 01/19/15 0322 01/20/15 0414  NA 135 139 137 139 140  K 4.5 3.9 3.5 3.4* 3.7  CL 109 114* 114* 117* 117*  CO2  16* 18* 16* 16* 16*  GLUCOSE 170* 161* 152* 131* 104*  BUN 53* 52* 44* 26* 26*  CREATININE 1.49* 1.36* 1.17 0.92 0.97  CALCIUM 8.3* 8.1* 7.7* 7.7* 7.8*   Liver Function Tests:  Recent Labs Lab 01/15/15 1528  AST 31  ALT 19  ALKPHOS 58  BILITOT 0.6  PROT 6.3*  ALBUMIN 2.8*   No results for input(s): LIPASE, AMYLASE in the last 168 hours. No results for input(s): AMMONIA in the last 168 hours. CBC:  Recent Labs Lab 01/17/15 1843 01/18/15 0250 01/18/15 1725 01/19/15 0322 01/20/15 0414  WBC 12.4* 9.8 11.5* 10.9* 10.6*  HGB 8.8* 8.4* 8.8* 8.4* 8.5*  HCT 25.2* 24.2* 25.6* 24.6* 25.3*  MCV 85.7 87.1 89.5 89.8 92.3  PLT 164 168 189 174 191   Cardiac Enzymes: No results for input(s): CKTOTAL, CKMB, CKMBINDEX, TROPONINI in the last 168 hours. BNP (last 3 results) No results for input(s): BNP in the last 8760 hours.  ProBNP (last 3 results)  Recent Labs  04/15/14 1450  PROBNP 729.0*    CBG:  Recent Labs Lab 01/17/15 1057  GLUCAP 166*    Micro Recent Results (from the past 240 hour(s))  MRSA PCR Screening     Status: None   Collection Time: 01/15/15  7:56 PM  Result Value Ref Range Status   MRSA by PCR NEGATIVE NEGATIVE Final    Comment:        The GeneXpert MRSA Assay (FDA approved for NASAL specimens only), is one component of a comprehensive MRSA colonization surveillance program. It is not intended to diagnose MRSA infection nor to guide or monitor treatment for MRSA infections.      Studies: No results found.  Scheduled Meds: . feeding supplement (RESOURCE BREEZE)  1 Container Oral TID BM  . fesoterodine  8 mg Oral Daily  . furosemide  20 mg Intravenous Once  . montelukast  10 mg Oral QHS  . multivitamin with minerals  1 tablet Oral q morning - 10a  . pantoprazole  40 mg Oral Q0600  . simvastatin  10 mg Oral QHS  . valACYclovir  500 mg Oral BID   Continuous Infusions:       Time spent: 35 minutes    [facility]  [facility] Pager [phone] If 7PM-7AM, please contact night-coverage at [email or web address], password [facility] 01/20/2015, 11:13 AM  LOS: 5 days

## 2015-01-21 LAB — CBC
HCT: 25 % — ABNORMAL LOW (ref 39.0–52.0)
Hemoglobin: 8.5 g/dL — ABNORMAL LOW (ref 13.0–17.0)
MCH: 31 pg (ref 26.0–34.0)
MCHC: 34 g/dL (ref 30.0–36.0)
MCV: 91.2 fL (ref 78.0–100.0)
Platelets: 214 10*3/uL (ref 150–400)
RBC: 2.74 MIL/uL — ABNORMAL LOW (ref 4.22–5.81)
RDW: 21.9 % — ABNORMAL HIGH (ref 11.5–15.5)
WBC: 11.9 10*3/uL — ABNORMAL HIGH (ref 4.0–10.5)

## 2015-01-21 LAB — BASIC METABOLIC PANEL
Anion gap: 8 (ref 5–15)
BUN: 19 mg/dL (ref 6–20)
CALCIUM: 7.7 mg/dL — AB (ref 8.9–10.3)
CO2: 16 mmol/L — ABNORMAL LOW (ref 22–32)
CREATININE: 0.91 mg/dL (ref 0.61–1.24)
Chloride: 113 mmol/L — ABNORMAL HIGH (ref 101–111)
GFR calc Af Amer: >60 mL/min (ref >60)
GFR calc non Af Amer: >60 mL/min (ref >60)
Glucose, Bld: 93 mg/dL (ref 65–99)
Potassium: 3.4 mmol/L — ABNORMAL LOW (ref 3.5–5.1)
SODIUM: 137 mmol/L (ref 135–145)

## 2015-01-21 MED ORDER — POTASSIUM CHLORIDE CRYS ER 20 MEQ PO TBCR
60.0000 meq | EXTENDED_RELEASE_TABLET | Freq: Once | ORAL | Status: AC
  Administered 2015-01-21: 60 meq via ORAL
  Filled 2015-01-21: qty 3

## 2015-01-21 NOTE — Discharge Summary (Signed)
Physician Discharge Summary  DANIL WEDGE DJM:[id number] DOB: Nov 26, 1923 DOA: 01/15/2015  PCP: Florina Ou, MD  Admit date: 01/15/2015 Discharge date: 01/21/2015  Time spent: 40 minutes  Recommendations for Outpatient Follow-up:  1. Patient to follow-up with primary care physician in one week. 2. Check CBC and BMP in 1 week. 3. Discontinued ibuprofen and warfarin. 4. Initially patient was supposed to go to nursing home, as family did not find the bed of their choice they elected to take him home and try to place him in SNF from home.  Discharge Diagnoses:  Active Problems:   CAD - CFX DES 7/07, LAD DES 10/08, cath 11/11- medical Rx   Pulmonary embolism   Chronic kidney disease (CKD), stage III (moderate)   Chronic anticoagulation   Severe anemia   GI bleed   Acute blood loss anemia   Malnutrition of moderate degree   Warfarin-induced coagulopathy   Melena   Discharge Condition: Stable  Diet recommendation: Heart healthy  Filed Weights   01/17/15 0227 01/18/15 0500 01/20/15 0525  Weight: 71.1 kg (156 lb 12 oz) 71.6 kg (157 lb 13.6 oz) 71.7 kg (158 lb 1.1 oz)    History of present illness:  LAYMAN GULLY is a 81 y.o. male with PMH significant for PE on coumadin diagnosed Oct 2015, CAD LAD DES 2008, CKD stage III Cr baseline 1.1, who presents after a fall. He was trying to get out of bed and rolled out of bed. He has been feeling weak and tired. Has had decrease appetite. Relates nausea, having diarrhea. He was notice to have melena in the ED. He denies chest pain or dyspnea, or abdominal pain. He has not been eating ok for last few days. He has been mildly confusion, intermittent per family.  He fell on thursday also.   Evaluation in the ED; patient found to have melena, Hb at 6, WBC at 15. CT head negative for intracranial bleed. EKG sinus rhythm. Cr at 1.4, lactic acid at 16.   Hospital Course:    GI bleed, melena;  Presented the hospital with melanotic stools, patient is  on Coumadin. His INR 3.1 and the time of admission, given 2 units of FFP. Coumadin and Plavix held, started on IV Protonix. Per GI if needed okay for aspirin and Plavix. We'll continue to hold still the day of discharge. EGD done and showed no evidence of bleeding, started on diet, tolerated very well.  Anemia, acute blood loss; in setting of GI bleed.  Presented with hemoglobin of 6.3, hemoglobin baseline is 9.8 from 04/20/2014. Status post transfusion of 2 units of packed RBCs, hemoglobin only improved to 7.6. Hemoglobin was 6.3, on 7/8 morning, ended up with transfusion of 2 more units of packed RBCs. Total transfusion of 4 units of packed RBCs, currently the hemoglobin stable around 8.5.  Acute on chronic renal failure. Stage III.  Creatinine baseline is 1.17, presented with creatinine of 1.49. Supportive care, IV fluids, creatinine is down to 0.9, on day of discharge.  Diarrhea:  C. difficile ordered initially, patient did not have diarrhea since admission, this was discontinued.  Metabolic acidosis; suspect multifactorial: decrease perfusion, renal failure, was on IV fluids. Follow-up as outpatient with BMP in 1 week.  History PE, Submassive diagnosed 04-2014. Hold coumadin in setting GI bleed.  FFP and Coumadin. Follow GI recommendation for anticoagulation.  Patient has had PE about 9 months ago, Coumadin discontinued.  Fall; fall precaution. In setting of anemia. CT head negative for acute bleed.  Encephalopathy , Confusion; intermittent per family. Supportive care.  Likely acute delirium, no UTI or other infection, patient is awake, alert and oriented 2. This was transient on admission, resolved now.  Leukocytosis; check for C diff. UA. Will start empirically flagyl for C diff, patient is not having diarrhea will discontinue Flagyl.   Procedures:  Transfusion of 2 units packed RBCs on 01/15/15  Transfusion of 2 units of FFP's on 01/15/2015  Transfusion of 2  units of packed RBCs on 01/17/2015.  EGD done by Dr. Henrene Pastor on 01/16/2015 showed ENDOSCOPIC IMPRESSION: 1. Normal EGD. No blood, bleeding, or mucosal abnormalities 2. Conceivable that he had bleeding from small AVM in the small intestine or colon. Could also have a colonic lesion. However, given her advanced age, frail state, and comorbidities would not pursue further workup. It may be that coming off Coumadin alone (greater than 6 months since DVT) will suffice. He can stay on Plavix and aspirin  RECOMMENDATIONS: 1. Continue daily oral PPI. He takes at home. 2. Okay for aspirin and Plavix as noted above 3. Would keep off Coumadin as noted above 4. Monitor blood counts in stools. Transfuse as needed. Advance diet. Send home when clear that bleeding has ceased  Consultations:  GI  Discharge Exam: Filed Vitals:   01/21/15 0644  BP: 147/49  Pulse: 74  Temp: 97.8 F (36.6 C)  Resp: 18   General: Alert and awake, oriented 2. HEENT: anicteric sclera, pupils reactive to light and accommodation, EOMI CVS: S1-S2 clear, no murmur rubs or gallops Chest: clear to auscultation bilaterally, no wheezing, rales or rhonchi Abdomen: soft nontender, nondistended, normal bowel sounds, no organomegaly Extremities: no cyanosis, clubbing or edema noted bilaterally Neuro: Cranial nerves II-XII intact, no focal neurological deficits   Discharge Instructions   Discharge Instructions    Diet - low sodium heart healthy    Complete by:  As directed      Increase activity slowly    Complete by:  As directed           Current Discharge Medication List    CONTINUE these medications which have NOT CHANGED   Details  cetaphil (CETAPHIL) lotion Apply 1 application topically daily as needed for dry skin. Compounded 1% lotion (CRM)    Multiple Vitamins-Minerals (OCUVITE ADULT 50+ PO) Take by mouth.    prednisoLONE acetate (PRED FORTE) 1 % ophthalmic suspension Place 1 drop into the left eye 3  (three) times daily.     sennosides-docusate sodium (SENOKOT-S) 8.6-50 MG tablet Take 1 tablet by mouth daily.     valACYclovir (VALTREX) 1000 MG tablet Take 1 g by mouth daily.    amLODipine (NORVASC) 10 MG tablet Take 10 mg by mouth daily.    aspirin EC 81 MG tablet Take 81 mg by mouth at bedtime.     atropine 1 % ophthalmic solution Place 1 drop into the left eye 2 (two) times daily.     brimonidine (ALPHAGAN) 0.2 % ophthalmic solution Place 1 drop into the left eye 2 (two) times daily.     clopidogrel (PLAVIX) 75 MG tablet Take 1 tablet (75 mg total) by mouth daily. Qty: 90 tablet, Refills: 2    diphenhydramine-acetaminophen (TYLENOL PM) 25-500 MG TABS Take 1 tablet by mouth at bedtime as needed. For pain    ferrous sulfate 325 (65 FE) MG tablet Take 325 mg by mouth daily with breakfast.    montelukast (SINGULAIR) 10 MG tablet Take 10 mg by mouth at bedtime.  Multiple Vitamin (MULTIVITAMIN WITH MINERALS) TABS Take 1 tablet by mouth every morning.     nitroGLYCERIN (NITROSTAT) 0.4 MG SL tablet Place 1 tablet (0.4 mg total) under the tongue every 5 (five) minutes as needed for chest pain. Qty: 25 tablet, Refills: 2    Omega-3 Fatty Acids (FISH OIL) 1200 MG CAPS Take 1,200 mg by mouth daily.     pantoprazole (PROTONIX) 40 MG tablet Take 1 tablet (40 mg total) by mouth daily. Qty: 90 tablet, Refills: 2    polyethylene glycol (MIRALAX / GLYCOLAX) packet Take 17 g by mouth at bedtime.     PROVENTIL HFA 108 (90 BASE) MCG/ACT inhaler Inhale 1-2 puffs into the lungs every 6 (six) hours as needed for wheezing or shortness of breath.     Pyridoxine HCl (VITAMIN B6 PO) Take 100 mg by mouth daily.     simvastatin (ZOCOR) 10 MG tablet Take 1 tablet (10 mg total) by mouth at bedtime. Qty: 90 tablet, Refills: 2    tolterodine (DETROL LA) 4 MG 24 hr capsule Take 4 mg by mouth daily.    traMADol (ULTRAM) 50 MG tablet Take 50 mg by mouth every 6 (six) hours as needed for moderate pain.        STOP taking these medications     ibuprofen (ADVIL,MOTRIN) 400 MG tablet      warfarin (COUMADIN) 5 MG tablet        Allergies  Allergen Reactions  . Ciprofloxacin Rash  . Tape Other (See Comments)    Pulls skin off.  Please use "paper tape" only.   . Dapsone Other (See Comments)    Lowers WBC count?? "made me very sick"   Follow-up Information    Follow up with [facility].   Why:  walker   Contact information:   [address] [place] [address] [phone]       Follow up with [facility].   Why:  Registered Nurse, Occupational Therapist, Physical Therapist, Aide, Social Work   Contact information:   [address] [place] [address] [phone]       Follow up with Florina Ou, MD In 1 week.   Specialty:  Family Medicine   Contact information:   [address] [place] [address] [phone]        The results of significant diagnostics from this hospitalization (including imaging, microbiology, ancillary and laboratory) are listed below for reference.    Significant Diagnostic Studies: Ct Head Wo Contrast  01/15/2015   CLINICAL DATA:  81 year old male status post fall with increasing confusion.  EXAM: CT HEAD WITHOUT CONTRAST  TECHNIQUE: Contiguous axial images were obtained from the base of the skull through the vertex without intravenous contrast.  COMPARISON:  MRI dated 08/05/2005  FINDINGS: The ventricles are dilated and the sulci are prominent compatible with age-related atrophy. Periventricular and deep white matter hypodensities represent chronic microvascular ischemic changes. Focal hypodensity in the right occipital lobe medially represent chronic ischemia. There is no intracranial hemorrhage. No mass effect or midline shift identified.  The visualized paranasal sinuses and mastoid air cells are well aerated. The calvarium is intact.  IMPRESSION: No acute  intracranial pathology.  Age-related atrophy and chronic microvascular ischemic disease.   Electronically Signed   By: Anner Crete M.D.   On: 01/15/2015 17:26    Microbiology: Recent Results (from the past 240 hour(s))  MRSA PCR Screening  Status: None   Collection Time: 01/15/15  7:56 PM  Result Value Ref Range Status   MRSA by PCR NEGATIVE NEGATIVE Final    Comment:        The GeneXpert MRSA Assay (FDA approved for NASAL specimens only), is one component of a comprehensive MRSA colonization surveillance program. It is not intended to diagnose MRSA infection nor to guide or monitor treatment for MRSA infections.      Labs: Basic Metabolic Panel:  Recent Labs Lab 01/16/15 0307 01/17/15 0248 01/19/15 0322 01/20/15 0414 01/21/15 0336  NA 139 137 139 140 137  K 3.9 3.5 3.4* 3.7 3.4*  CL 114* 114* 117* 117* 113*  CO2 18* 16* 16* 16* 16*  GLUCOSE 161* 152* 131* 104* 93  BUN 52* 44* 26* 26* 19  CREATININE 1.36* 1.17 0.92 0.97 0.91  CALCIUM 8.1* 7.7* 7.7* 7.8* 7.7*   Liver Function Tests:  Recent Labs Lab 01/15/15 1528  AST 31  ALT 19  ALKPHOS 58  BILITOT 0.6  PROT 6.3*  ALBUMIN 2.8*   No results for input(s): LIPASE, AMYLASE in the last 168 hours. No results for input(s): AMMONIA in the last 168 hours. CBC:  Recent Labs Lab 01/18/15 0250 01/18/15 1725 01/19/15 0322 01/20/15 0414 01/21/15 0336  WBC 9.8 11.5* 10.9* 10.6* 11.9*  HGB 8.4* 8.8* 8.4* 8.5* 8.5*  HCT 24.2* 25.6* 24.6* 25.3* 25.0*  MCV 87.1 89.5 89.8 92.3 91.2  PLT 168 189 174 191 214   Cardiac Enzymes: No results for input(s): CKTOTAL, CKMB, CKMBINDEX, TROPONINI in the last 168 hours. BNP: BNP (last 3 results) No results for input(s): BNP in the last 8760 hours.  ProBNP (last 3 results)  Recent Labs  04/15/14 1450  PROBNP 729.0*    CBG:  Recent Labs Lab 01/17/15 1057  GLUCAP 166*       Signed:  Maisee Vollman A  [facility] 01/21/2015, 11:41 AM

## 2015-01-21 NOTE — Progress Notes (Addendum)
Physical Therapy Treatment Patient Details Name: Larry Huffman MRN: [id number] DOB: [birth year] Today's Date: 01/21/2015    History of Present Illness Pt is an 81 y.o. male with a PMH signficant for PE, CAD LAD DES 2008, CKD III, who presents after a fall. He was trying to get out of bed and rolled out of the bed. He states he has been feeling weak and tired. Intermittent mild confusion per family. Pt was admitted for GI bleed.    PT Comments    Notified by Case Manager of change in d/c plan (due to family refusing first SNF bed offer). Per Case Manager, family was present earlier and plan is for 2 male family members to transport pt to daughter's home and will stay with her until a bed opens at their preferred SNF. Pt was able to stand and take small steps with RW with 1 person assist (would need 2 people for safety to ambulate any farther). Noted there is a ramped entrance, however no mention of wheelchair or transport chair (would recommend wheelchair for car to home transport). No family members present at this time. Communicated status to RN. Agree with HHPT in case pt remains at daughter's indefinitely awaiting a bed opening.  Addendum 15:50- Social worker confirmed that family is bringing a wheelchair for transport. Also confirmed family has a 3n1. RW only need (already ordered and delivered).   Follow Up Recommendations  SNF;Supervision/Assistance - 24 hour (family choosing to take home until bed opens in desired SNF)     Equipment Recommendations  Rolling walker with 5" wheels (already in pt's room)   Recommendations for Other Services       Precautions / Restrictions Precautions Precautions: Fall Precaution Comments: 2 falls PTA    Mobility  Bed Mobility Overal bed mobility: Needs Assistance Bed Mobility: Rolling;Sidelying to Sit;Sit to Supine Rolling: Min assist Sidelying to sit: Mod assist;HOB elevated   Sit to supine: Min assist   General bed mobility comments: vc  for sequencing to roll (+rail) and to progress to sit EOB; assist to raise torso; assist to raise legs on return  Transfers Overall transfer level: Needs assistance Equipment used: Rolling walker (2 wheeled) Transfers: Sit to/from Stand Sit to Stand: Mod assist         General transfer comment: vc for hand placement, foot placement, and sequencing; assist to initiate lift off to mid-stand  Ambulation/Gait Ambulation/Gait assistance: Min assist Ambulation Distance (Feet): 2 Feet Assistive device: Rolling walker (2 wheeled) Gait Pattern/deviations: Step-to pattern;Decreased stride length;Shuffle (leans Rt)     General Gait Details: steps forward, backward, sideways (at most 1 ft from bed due to weak, shakey legs)   Stairs            Wheelchair Mobility    Modified Rankin (Stroke Patients Only)       Balance   Sitting-balance support: Feet supported;No upper extremity supported Sitting balance-Leahy Scale: Fair     Standing balance support: Bilateral upper extremity supported Standing balance-Leahy Scale: Poor                      Cognition Arousal/Alertness: Lethargic (initially and better once sitting) Behavior During Therapy: Flat affect Overall Cognitive Status: No family/caregiver present to determine baseline cognitive functioning Area of Impairment: Orientation;Memory;Problem solving Orientation Level: Time;Situation   Memory: Decreased short-term memory       Problem Solving: Slow processing;Decreased initiation;Difficulty sequencing;Requires verbal cues;Requires tactile cues General Comments: oriented to self, DOB; unaware of plan  to go to daughter's home, although per RN multiple family members here earlier to discuss plan (in pt's room)    Exercises General Exercises - Upper Extremity Shoulder Flexion: AAROM;Both;Other reps (comment) (resisted extension back down) Elbow Extension: AROM;Both;Other reps (comment) (resisted  extension) General Exercises - Lower Extremity Heel Slides: AAROM;Both;10 reps    General Comments        Pertinent Vitals/Pain Pain Assessment: Faces Faces Pain Scale: Hurts even more Pain Location: Rt hand Pain Descriptors / Indicators: Aching Pain Intervention(s): Limited activity within patient's tolerance;Monitored during session;Repositioned (elevated due to edema)    Home Living                      Prior Function            PT Goals (current goals can now be found in the care plan section) Acute Rehab PT Goals Patient Stated Goal: agrees needs to get stronger Time For Goal Achievement: 01/26/15 Progress towards PT goals: Progressing toward goals    Frequency  Min 3X/week    PT Plan Current plan remains appropriate;Equipment recommendations need to be updated    Co-evaluation             End of Session Equipment Utilized During Treatment: Gait belt Activity Tolerance: Patient limited by fatigue Patient left: in bed;with call bell/phone within reach;with bed alarm set     Time: 4709-2957 PT Time Calculation (min) (ACUTE ONLY): 29 min  Charges:  $Gait Training: 8-22 mins $Therapeutic Activity: 8-22 mins                    G Codes:      Larry Huffman 26-Jan-2015, 3:55 PM Pager [phone]

## 2015-01-21 NOTE — Care Management Note (Addendum)
Case Management Note  Patient Details  Name: Larry Huffman MRN: [id number] Date of Birth: 09-04-1923  Subjective/Objective:  Pt admitted with severe anemia                 Action/Plan:  Pt is from home with daughter.  I hours therapy evaluated pt and recommended SNF.  CSW consulted and offered pt LOG bed because approval has not been obtained and pt is clinically stable for discharge.  However,  pts daughter Larry Huffman refused bed, daughter wishes to take pt home with [facility] and wait for insurance approval for bed at preferred [facility].  CSW is communicating with facility and  providing all necessary documents to [facility] awaiting on bed approval while pt goes home during the interim.  MD is aware; [facility] services will be ordered.   Expected Discharge Date:                  Expected Discharge Plan:  Skilled Nursing Facility  In-House Referral:  Clinical Social Work  Discharge planning Services  CM Consult  Post Acute Care Choice:    Choice offered to:  Daughter, Larry Huffman  DME Arranged:  [facility]ford Rile rolling DME Agency:  [place] Arranged:  RN, PT, OT, Nurse's Aide, Social Work [employer] Agency:  [place]  Status of Service:  Completed, signed off  Medicare Important Message Given:  Yes-second notification given Date Medicare IM Given:    Medicare IM give by:    Date Additional Medicare IM Given:    Additional Medicare Important Message give by:     If discussed at [place] of Stay Meetings, dates discussed:  01/21/15  Additional Comments: PT evaluated pt prior to discharge per CM request; recommendations are consist ant with [place] orders already written.  Pt's nephew will bring wheelchair from home for pt to transport from hospital.  NO CM needs  CM contacted daughter Larry Huffman via work phone listed in epic and verified plan listed above, offered choice for [facility] and DME, daughter chose [facility], both DME and [facility] agencies contacted and referral was accepted.  CM  verified address and daughters phone number against epic.  CM asked daughter if she needed DME;  the only need was for a walker, pt already has access to bedside commode, no other equipment deemed necessary. CM contacted PT for pre discharge evaluation to determine any additional needs with the change in disposition plan.  Per daughter,  pts nephew will pick up pt from hospital at approximately 3-4pm today.    Pt's nephew and niece will be will pt during the day and pt's daughter in addition to niece and nephew will provide care in the evenings for 24 total supervision.  MD contacted; MD in agreement with plan.  MD does not want lab draws for pt at home per Physician Advisor request.  MD recommends that pt remain on coumadin at this time. CSW will continue to work SNF disposition when bed approval is obtained.  No additional CM needs, Maryclare Labrador, RN 01/21/2015, 10:38 AM

## 2015-01-21 NOTE — Progress Notes (Addendum)
[facility] has initiated insurance auth but unable to obtain today (7/12) CSW found LOG bed offer for patient- patient daughter is not agreeable to LOG placement and would like to take patient home until Josem Kaufmann is received.  CSW confirmed plan with facility and they are agreeable  CSW informed RNCM of plan for home with home health until we get approval.  Pt family will pick up patient between 3-4pm and take home  Faxed signed fl2 and DC summary to facility  CSW signing off.  Domenica Reamer, [place] Social Worker [phone]

## 2015-01-23 ENCOUNTER — Ambulatory Visit: Payer: Self-pay | Admitting: Pharmacist Clinician (PhC)/ Clinical Pharmacy Specialist

## 2015-01-23 DIAGNOSIS — I2699 Other pulmonary embolism without acute cor pulmonale: Secondary | ICD-10-CM

## 2015-01-24 ENCOUNTER — Non-Acute Institutional Stay: Payer: Medicare HMO | Admitting: Adult Health

## 2015-01-24 ENCOUNTER — Encounter: Payer: Self-pay | Admitting: Adult Health

## 2015-01-24 DIAGNOSIS — I2510 Atherosclerotic heart disease of native coronary artery without angina pectoris: Secondary | ICD-10-CM | POA: Diagnosis not present

## 2015-01-24 DIAGNOSIS — K921 Melena: Secondary | ICD-10-CM

## 2015-01-24 DIAGNOSIS — K5900 Constipation, unspecified: Secondary | ICD-10-CM

## 2015-01-24 DIAGNOSIS — E785 Hyperlipidemia, unspecified: Secondary | ICD-10-CM

## 2015-01-24 DIAGNOSIS — G4733 Obstructive sleep apnea (adult) (pediatric): Secondary | ICD-10-CM

## 2015-01-24 DIAGNOSIS — E43 Unspecified severe protein-calorie malnutrition: Secondary | ICD-10-CM | POA: Diagnosis not present

## 2015-01-24 DIAGNOSIS — R531 Weakness: Secondary | ICD-10-CM

## 2015-01-24 DIAGNOSIS — N183 Chronic kidney disease, stage 3 unspecified: Secondary | ICD-10-CM

## 2015-01-24 DIAGNOSIS — I2699 Other pulmonary embolism without acute cor pulmonale: Secondary | ICD-10-CM

## 2015-01-24 DIAGNOSIS — Z8619 Personal history of other infectious and parasitic diseases: Secondary | ICD-10-CM | POA: Diagnosis not present

## 2015-01-24 DIAGNOSIS — D62 Acute posthemorrhagic anemia: Secondary | ICD-10-CM | POA: Diagnosis not present

## 2015-01-24 DIAGNOSIS — D72829 Elevated white blood cell count, unspecified: Secondary | ICD-10-CM | POA: Diagnosis not present

## 2015-01-24 DIAGNOSIS — I10 Essential (primary) hypertension: Secondary | ICD-10-CM | POA: Diagnosis not present

## 2015-01-24 DIAGNOSIS — J309 Allergic rhinitis, unspecified: Secondary | ICD-10-CM | POA: Insufficient documentation

## 2015-01-24 DIAGNOSIS — R32 Unspecified urinary incontinence: Secondary | ICD-10-CM

## 2015-01-24 NOTE — Progress Notes (Signed)
Patient ID: Larry Huffman, male   DOB: 09/24/1923, 81 y.o.   MRN: [id number]    DATE:  01/24/2015 MRN:  [id number]  BIRTHDAY: May 06, 1924  Facility:  Nursing Home Location:  [place] Room Number: 063-0  LEVEL OF CARE:  SNF [id number]  Contact Information    Name Relation Home Work [place] Daughter [phone]   [place], Moree   [phone]       Chief Complaint  Patient presents with  . Hospitalization Follow-up    Generalized weakness, GI Bleed, Anemia, Chronic renal failure stage III, PE history, Leukocytosis, OSA and history of shingles    HISTORY OF PRESENT ILLNESS:   This is an 81 year old male who has been admitted to [facility] on 01/23/15 from home. He was discharged home from the hospital on 01/21/15. He fell out of bed. He has been weak, tired, decreased appetite, nausea and diarrhea. He was noticed to have melena in the ED. He was taking Coumadin for PE which was diagnosed in 10/15. Work up in the ED shows hgb 6, wbc 15, EKG SR, CT head negative for intracranial bleed, creatinine 1.4, lactic acid 16 and INR 3.1 He was given 2 units FFP. EGD done showed no evidence of bleeding. Hgb @ discharge is 8.5 .  He has been admitted for a short-term rehabilitation.  PAST MEDICAL HISTORY:  Past Medical History  Diagnosis Date  . Hypertension   . Acid reflux   . High cholesterol   . Coronary artery disease   . Arthritis   . Sleep apnea     CPAP  . Peripheral vascular disease     Remote left carotid endarterectomy  . Cancer hx of skin cancer  . HOH (hard of hearing)     wears hearing aids, but still can not hear  . Incontinence of urine   . Constipation      CURRENT MEDICATIONS: Reviewed  Patient's Medications  New Prescriptions   No medications on file  Previous Medications   AMLODIPINE (NORVASC) 10 MG TABLET    Take 10 mg by mouth daily.   ASPIRIN EC 81 MG TABLET    Take 81 mg by mouth at  bedtime.    ATROPINE 1 % OPHTHALMIC SOLUTION    Place 1 drop into the left eye 2 (two) times daily.    BRIMONIDINE (ALPHAGAN) 0.2 % OPHTHALMIC SOLUTION    Place 1 drop into the left eye 2 (two) times daily.    CETAPHIL (CETAPHIL) LOTION    Apply 1 application topically daily as needed for dry skin. Compounded 1% lotion (CRM)   CLOPIDOGREL (PLAVIX) 75 MG TABLET    Take 1 tablet (75 mg total) by mouth daily.   DIPHENHYDRAMINE-ACETAMINOPHEN (TYLENOL PM) 25-500 MG TABS    Take 1 tablet by mouth at bedtime as needed. For pain   FERROUS SULFATE 325 (65 FE) MG TABLET    Take 325 mg by mouth daily with breakfast.   MONTELUKAST (SINGULAIR) 10 MG TABLET    Take 10 mg by mouth at bedtime.   MULTIPLE VITAMIN (MULTIVITAMIN WITH MINERALS) TABS    Take 1 tablet by mouth every morning.    MULTIPLE VITAMINS-MINERALS (OCUVITE ADULT 50+ PO)    Take by mouth.   NITROGLYCERIN (NITROSTAT) 0.4 MG SL TABLET    Place 1 tablet (0.4 mg total) under the tongue every 5 (five) minutes as needed for chest pain.   OMEGA-3 FATTY ACIDS (FISH  OIL) 1200 MG CAPS    Take 1,200 mg by mouth daily.    PANTOPRAZOLE (PROTONIX) 40 MG TABLET    Take 1 tablet (40 mg total) by mouth daily.   POLYETHYLENE GLYCOL (MIRALAX / GLYCOLAX) PACKET    Take 17 g by mouth at bedtime.    PREDNISOLONE ACETATE (PRED FORTE) 1 % OPHTHALMIC SUSPENSION    Place 1 drop into the left eye 3 (three) times daily.    PROVENTIL HFA 108 (90 BASE) MCG/ACT INHALER    Inhale 1-2 puffs into the lungs every 6 (six) hours as needed for wheezing or shortness of breath.    PYRIDOXINE HCL (VITAMIN B6 PO)    Take 100 mg by mouth daily.    SENNOSIDES-DOCUSATE SODIUM (SENOKOT-S) 8.6-50 MG TABLET    Take 1 tablet by mouth daily.    SIMVASTATIN (ZOCOR) 10 MG TABLET    Take 1 tablet (10 mg total) by mouth at bedtime.   TOLTERODINE (DETROL LA) 4 MG 24 HR CAPSULE    Take 4 mg by mouth daily.   TRAMADOL (ULTRAM) 50 MG TABLET    Take 50 mg by mouth every 6 (six) hours as needed for  moderate pain.    VALACYCLOVIR (VALTREX) 1000 MG TABLET    Take 1 g by mouth daily.  Modified Medications   No medications on file  Discontinued Medications   No medications on file     Allergies  Allergen Reactions  . Ciprofloxacin Rash  . Tape Other (See Comments)    Pulls skin off.  Please use "paper tape" only.   . Dapsone Other (See Comments)    Lowers WBC count?? "made me very sick"     REVIEW OF SYSTEMS:  GENERAL:  no fever, chills , +weakness SKIN: Denies rash, itching, wounds, ulcer sores, or nail abnormality EYES: Denies change in vision, dry eyes, eye pain, itching or discharge EARS: Denies change in hearing, ringing in ears, or earache NOSE: Denies nasal congestion or epistaxis MOUTH and THROAT: Denies oral discomfort, gingival pain or bleeding, pain from teeth or hoarseness   RESPIRATORY: no cough, SOB, DOE, wheezing, hemoptysis CARDIAC: no chest pain, edema or palpitations GI: no abdominal pain, diarrhea, constipation, heart burn, nausea or vomiting GU: Denies dysuria, frequency, hematuria, incontinence, or discharge MUSCULOSKELETAL: Denies joit pain, muscle pain, back pain CIRCULATION: Denies claudication, edema of legs, varicosities, or cold extremities NEUROLOGICAL: Denies dizziness, syncope, numbness, or headache PSYCHIATRIC: Denies feeling of depression or anxiety. No report of hallucinations, insomnia, paranoia, or agitation ENDOCRINE: Denies polyphagia, polyuria, polydipsia, heat or cold intolerance HEME/LYMPH: Denies excessive bruising, petechia, enlarged lymph nodes, or bleeding problems   PHYSICAL EXAMINATION  GENERAL APPEARANCE: no acute distress. Normal body habitus SKIN:  Skin is warm and dry. There are no suspicious lesions or rash HEAD: Normal in size and contour. No evidence of trauma EYES: Lids open and close normally. No blepharitis, entropion or ectropion. PERRL. Conjunctivae are clear and sclerae are white. Lenses are without  opacity EARS: Pinnae are normal. Patient hears normal voice tunes of the examiner MOUTH and THROAT: Lips are without lesions. Oral mucosa is moist and without lesions. Tongue is normal in shape, size, and color and without lesions NECK: supple, trachea midline, no neck masses, no thyroid tenderness, no thyromegaly LYMPHATICS: no LAN in the neck, no supraclavicular LAN RESPIRATORY: breathing is even & unlabored, BS CTAB CARDIAC: RRR, no murmur,no extra heart sounds, no edema GI: abdomen soft, normal BS, no masses, no tenderness, no hepatomegaly, no  splenomegaly MUSCULOSKELETAL: No deformities. Movement at each extremity is full and painless. Strength is 5/5 at each extremity. Back is without kyphosis or scoliosis CIRCULATION: pedal pulses are 2+. There is no edema of the legs, ankles and feet NEUROLOGICAL: There is no tremor. Speech is clear PSYCHIATRIC: Alert and oriented X 3. Affect and behavior are appropriate  LABS/RADIOLOGY: Labs reviewed: Basic Metabolic Panel:  Recent Labs  01/19/15 0322 01/20/15 0414 01/21/15 0336  NA 139 140 137  K 3.4* 3.7 3.4*  CL 117* 117* 113*  CO2 16* 16* 16*  GLUCOSE 131* 104* 93  BUN 26* 26* 19  CREATININE 0.92 0.97 0.91  CALCIUM 7.7* 7.8* 7.7*   Liver Function Tests:  Recent Labs  02/04/14 1145 01/15/15 1528  AST 24 31  ALT 17 19  ALKPHOS 76 58  BILITOT 0.3 0.6  PROT 7.2 6.3*  ALBUMIN 3.5 2.8*   CBC:  Recent Labs  04/13/14 1023  01/19/15 0322 01/20/15 0414 01/21/15 0336  WBC 15.3*  < > 10.9* 10.6* 11.9*  NEUTROABS 9.9*  --   --   --   --   HGB 10.8*  < > 8.4* 8.5* 8.5*  HCT 32.2*  < > 24.6* 25.3* 25.0*  MCV 92.0  < > 89.8 92.3 91.2  PLT 253  < > 174 191 214  < > = values in this interval not displayed.  Cardiac Enzymes:  Recent Labs  04/13/14 1414 04/13/14 1935 04/14/14 0335  TROPONINI <0.30 <0.30 <0.30   CBG:  Recent Labs  01/17/15 1057  GLUCAP 166*      Ct Head Wo Contrast  01/15/2015   CLINICAL DATA:   81 year old male status post fall with increasing confusion.  EXAM: CT HEAD WITHOUT CONTRAST  TECHNIQUE: Contiguous axial images were obtained from the base of the skull through the vertex without intravenous contrast.  COMPARISON:  MRI dated 08/05/2005  FINDINGS: The ventricles are dilated and the sulci are prominent compatible with age-related atrophy. Periventricular and deep white matter hypodensities represent chronic microvascular ischemic changes. Focal hypodensity in the right occipital lobe medially represent chronic ischemia. There is no intracranial hemorrhage. No mass effect or midline shift identified.  The visualized paranasal sinuses and mastoid air cells are well aerated. The calvarium is intact.  IMPRESSION: No acute intracranial pathology.  Age-related atrophy and chronic microvascular ischemic disease.   Electronically Signed   By: Anner Crete M.D.   On: 01/15/2015 17:26    ASSESSMENT/PLAN:   Generalized weakness - for rehabilitation  GI Bleed - S/P transfusion of 2 units FFP; Coumadin was discontinued; continue Protonix 40 mg 1 tab PO Q D  Anemia, acute blood loss - hgb 8.5; S/P transfusion of 4 units PRBC; continue FeSO4 325 mg 1 tab PO Q D; check CBC  Chronic renal failure, stage III - creatinine baseline 1.7; presented with creatinine 1.49; IV fluids given; creatinine @ discharge is o.97; check BMP  PE - diagnosed 10/15; Coumadin on hold  Leukocytosis - wbc 11.9; will monitor  OSA - CPAP @ night  Hx of shingles - continue Valtrex 1,000 mg daily  Constipation - continue Senna-S 1 tab PO Q D and Miralax 17 gm PO Q HS  Protein-calorie malnutrition, severe - albumin 2.8; continue supplementation  Hypertension - continue Norvasc 10 mg 1 tab PO Q D  CAD - stable; continue ASA 81 mg 1 tab PO Q D, Plavix 75 mg PO Q D and NTG PRN  Allergic rhinitis - continue Singulair 10 mg  1 tab PO Q D  Hyperlipidemia - continue Zocor 10 mg 1 tab PO Q D  Urinary Incontinence -  continue Detrol LA 4 mg 24 HR 1 capsule PO Q D     Goals of care:  Short-term rehabilitation    [facility], NP [place]

## 2015-01-27 ENCOUNTER — Non-Acute Institutional Stay (SKILLED_NURSING_FACILITY): Payer: Medicare HMO | Admitting: Internal Medicine

## 2015-01-27 ENCOUNTER — Encounter: Payer: Self-pay | Admitting: Internal Medicine

## 2015-01-27 DIAGNOSIS — E876 Hypokalemia: Secondary | ICD-10-CM

## 2015-01-27 DIAGNOSIS — N183 Chronic kidney disease, stage 3 (moderate): Secondary | ICD-10-CM

## 2015-01-27 DIAGNOSIS — E46 Unspecified protein-calorie malnutrition: Secondary | ICD-10-CM | POA: Diagnosis not present

## 2015-01-27 DIAGNOSIS — K5900 Constipation, unspecified: Secondary | ICD-10-CM | POA: Diagnosis not present

## 2015-01-27 DIAGNOSIS — R5381 Other malaise: Secondary | ICD-10-CM

## 2015-01-27 DIAGNOSIS — R791 Abnormal coagulation profile: Secondary | ICD-10-CM | POA: Diagnosis not present

## 2015-01-27 DIAGNOSIS — K921 Melena: Secondary | ICD-10-CM | POA: Diagnosis not present

## 2015-01-27 DIAGNOSIS — D62 Acute posthemorrhagic anemia: Secondary | ICD-10-CM

## 2015-01-27 DIAGNOSIS — D72829 Elevated white blood cell count, unspecified: Secondary | ICD-10-CM

## 2015-01-27 DIAGNOSIS — I2699 Other pulmonary embolism without acute cor pulmonale: Secondary | ICD-10-CM | POA: Diagnosis not present

## 2015-01-27 DIAGNOSIS — I2510 Atherosclerotic heart disease of native coronary artery without angina pectoris: Secondary | ICD-10-CM | POA: Diagnosis not present

## 2015-01-27 DIAGNOSIS — I10 Essential (primary) hypertension: Secondary | ICD-10-CM | POA: Diagnosis not present

## 2015-01-27 LAB — CBC AND DIFFERENTIAL
HEMATOCRIT: 21 % — AB (ref 41–53)
Hemoglobin: 7.4 g/dL — AB (ref 13.5–17.5)
PLATELETS: 322 10*3/uL (ref 150–399)
WBC: 9.8 10^3/mL

## 2015-01-27 LAB — BASIC METABOLIC PANEL
BUN: 16 mg/dL (ref 4–21)
CREATININE: 0.9 mg/dL (ref 0.6–1.3)
Glucose: 98 mg/dL
Potassium: 3.8 mmol/L (ref 3.4–5.3)
Sodium: 134 mmol/L — AB (ref 137–147)

## 2015-01-27 NOTE — Progress Notes (Signed)
Patient ID: Larry Huffman, male   DOB: [birth year], 81 y.o.   MRN: [id number]     [place]  PCP: Florina Ou, MD  Code Status: DNR   Allergies  Allergen Reactions  . Ciprofloxacin Rash  . Tape Other (See Comments)    Pulls skin off.  Please use "paper tape" only.   . Dapsone Other (See Comments)    Lowers WBC count?? "made me very sick"    Chief Complaint  Patient presents with  . New Admit To SNF    New Admission      HPI:  81 y.o. patient is here for short term rehabilitation post hospital admission from 01/15/15-01/21/15 with fall and melena. His INR was 3.1 on admission and he had blood loss anemia. He received 2 u FFP to reverse this. He was started on iv protonix and gi was consulted. EGD showed no evidence of bleeding. He received a total of 4 u prbc this admission with hb 8.5 at discharge. He had acute renal failure and responded well to iv fluids. His coumadin is now discontinued and he is back on aspirin and plavix after clearance from GI. He has PMH of PE, CAD, CKD stage 3. He is seen in his room today. He is on o2, feels weak and tired. He has occasional dizziness with change of position. He has not had a bowel movement in a week. No other concerns.  Review of Systems:  Constitutional: Negative for fever, chills, diaphoresis.  HENT: Negative for headache, congestion, nasal discharge Eyes: Negative for eye pain, blurred vision, double vision and discharge.  Respiratory: Negative for cough, shortness of breath and wheezing.   Cardiovascular: Negative for chest pain, palpitations, leg swelling.  Gastrointestinal: Negative for heartburn, nausea, vomiting, abdominal pain Genitourinary: Negative for dysuria Musculoskeletal: Negative for back pain, falls Skin: Negative for itching, rash.  Neurological: Negative for tingling, focal weakness Psychiatric/Behavioral: Negative for depression   Past Medical History  Diagnosis Date  . Hypertension   . Acid  reflux   . High cholesterol   . Coronary artery disease   . Arthritis   . Sleep apnea     CPAP  . Peripheral vascular disease     Remote left carotid endarterectomy  . Cancer hx of skin cancer  . HOH (hard of hearing)     wears hearing aids, but still can not hear  . Incontinence of urine   . Constipation    Past Surgical History  Procedure Laterality Date  . Rotator cuff repair    . Prostate surgery    . Laparotomy    . Coronary stent placement    . Nasal sinus surgery    . Anal fissure repair    . Eye surgery      cataracts  . Irrigation and debridement abscess    . I&d extremity  06/28/2012    Procedure: IRRIGATION AND DEBRIDEMENT EXTREMITY;  Surgeon: Newt Minion, MD;  Location: [facility];  Service: Orthopedics;  Laterality: Right;  Debridement Wound Right Leg, VAC, Antibiotic Beads, Apply A-Cell  . 2-d echocardiogram  03/07/2008    Ejection fraction greater than 55%. Mild concentric left ventricular hypertrophy.LV relaxation. mildly dilated left atrium. Mild mitral regurgitation.   . Cardiac catheterization      X 2 stents  . Colonoscopy w/ polypectomy    . Colon surgery      removed part of the small intestine  . Lumbar laminectomy N/A 02/08/2014  Procedure: L3-4, L4-5 Decompression;  Surgeon: Marybelle Killings, MD;  Location: [facility];  Service: Orthopedics;  Laterality: N/A;  L3-4, L4-5 Decompression  . Esophagogastroduodenoscopy N/A 01/16/2015    Procedure: ESOPHAGOGASTRODUODENOSCOPY (EGD);  Surgeon: Irene Shipper, MD;  Location: [facility] ENDOSCOPY;  Service: Endoscopy;  Laterality: N/A;   Social History:   reports that he quit smoking about 36 years ago. His smoking use included Cigarettes. He has never used smokeless tobacco. He reports that he does not drink alcohol or use illicit drugs.  Family History  Problem Relation Age of Onset  . Hypertension Mother     Medications:   Medication List       This list is accurate as of: 01/27/15 10:21 AM.  Always use your most recent  med list.               amLODipine 10 MG tablet  Commonly known as:  NORVASC  Take 10 mg by mouth daily.     aspirin EC 81 MG tablet  Take 81 mg by mouth at bedtime.     atropine 1 % ophthalmic solution  Place 1 drop into the left eye 2 (two) times daily.     brimonidine 0.2 % ophthalmic solution  Commonly known as:  ALPHAGAN  Place 1 drop into the left eye 2 (two) times daily.     clopidogrel 75 MG tablet  Commonly known as:  PLAVIX  Take 1 tablet (75 mg total) by mouth daily.     ferrous sulfate 325 (65 FE) MG tablet  Take 325 mg by mouth daily with breakfast.     Fish Oil 1200 MG Caps  Take 1,200 mg by mouth daily.     montelukast 10 MG tablet  Commonly known as:  SINGULAIR  Take 10 mg by mouth at bedtime.     multivitamin with minerals Tabs tablet  Take 1 tablet by mouth every morning.     nitroGLYCERIN 0.4 MG SL tablet  Commonly known as:  NITROSTAT  Place 1 tablet (0.4 mg total) under the tongue every 5 (five) minutes as needed for chest pain.     OCUVITE ADULT 50+ PO  Take by mouth.     pantoprazole 40 MG tablet  Commonly known as:  PROTONIX  Take 1 tablet (40 mg total) by mouth daily.     polyethylene glycol packet  Commonly known as:  MIRALAX / GLYCOLAX  Take 17 g by mouth at bedtime.     prednisoLONE acetate 1 % ophthalmic suspension  Commonly known as:  PRED FORTE  Place 1 drop into the left eye 3 (three) times daily.     PROCEL 100 Powd  Take 1 scoop by mouth 2 (two) times daily. For nutritional support and to increase protein     PROVENTIL HFA 108 (90 BASE) MCG/ACT inhaler  Generic drug:  albuterol  Inhale 1-2 puffs into the lungs every 6 (six) hours as needed for wheezing or shortness of breath.     sennosides-docusate sodium 8.6-50 MG tablet  Commonly known as:  SENOKOT-S  Take 1 tablet by mouth daily.     simvastatin 10 MG tablet  Commonly known as:  ZOCOR  Take 1 tablet (10 mg total) by mouth at bedtime.     tolterodine 4 MG 24  hr capsule  Commonly known as:  DETROL LA  Take 4 mg by mouth daily.     valACYclovir 1000 MG tablet  Commonly known as:  VALTREX  Take 1  g by mouth daily.     VITAMIN B6 PO  Take 100 mg by mouth daily.         Physical Exam: Filed Vitals:   01/27/15 1011  BP: 139/59  Pulse: 61  Temp: 97.6 F (36.4 C)  TempSrc: Oral  Resp: 17  SpO2: 99%    General- elderly male, in no acute distress Head- normocephalic, atraumatic Nose- no maxillary or frontal sinus tenderness, no nasal discharge Throat- moist mucus membrane Eyes- no pallor, no icterus, no discharge, normal conjunctiva, normal sclera Neck- no cervical lymphadenopathy Cardiovascular- normal s1,s2, no murmurs, palpable dorsalis pedis, trace leg edema Respiratory- bilateral clear to auscultation, no wheeze, no rhonchi, no crackles, no use of accessory muscles, on o2 Abdomen- bowel sounds present, soft, non tender Musculoskeletal- able to move all 4 extremities, generalized weakness Neurological- no focal deficit, alert and oriented  Skin- warm and dry Psychiatry- normal mood and affect    Labs reviewed: Basic Metabolic Panel:  Recent Labs  01/19/15 0322 01/20/15 0414 01/21/15 0336  NA 139 140 137  K 3.4* 3.7 3.4*  CL 117* 117* 113*  CO2 16* 16* 16*  GLUCOSE 131* 104* 93  BUN 26* 26* 19  CREATININE 0.92 0.97 0.91  CALCIUM 7.7* 7.8* 7.7*   Liver Function Tests:  Recent Labs  02/04/14 1145 01/15/15 1528  AST 24 31  ALT 17 19  ALKPHOS 76 58  BILITOT 0.3 0.6  PROT 7.2 6.3*  ALBUMIN 3.5 2.8*   No results for input(s): LIPASE, AMYLASE in the last 8760 hours. No results for input(s): AMMONIA in the last 8760 hours. CBC:  Recent Labs  04/13/14 1023  01/19/15 0322 01/20/15 0414 01/21/15 0336  WBC 15.3*  < > 10.9* 10.6* 11.9*  NEUTROABS 9.9*  --   --   --   --   HGB 10.8*  < > 8.4* 8.5* 8.5*  HCT 32.2*  < > 24.6* 25.3* 25.0*  MCV 92.0  < > 89.8 92.3 91.2  PLT 253  < > 174 191 214  < > =  values in this interval not displayed. Cardiac Enzymes:  Recent Labs  04/13/14 1414 04/13/14 1935 04/14/14 0335  TROPONINI <0.30 <0.30 <0.30   BNP: Invalid input(s): POCBNP CBG:  Recent Labs  01/17/15 1057  GLUCAP 166*    Radiological Exams: Ct Head Wo Contrast  01/15/2015   CLINICAL DATA:  81 year old male status post fall with increasing confusion.  EXAM: CT HEAD WITHOUT CONTRAST  TECHNIQUE: Contiguous axial images were obtained from the base of the skull through the vertex without intravenous contrast.  COMPARISON:  MRI dated 08/05/2005  FINDINGS: The ventricles are dilated and the sulci are prominent compatible with age-related atrophy. Periventricular and deep white matter hypodensities represent chronic microvascular ischemic changes. Focal hypodensity in the right occipital lobe medially represent chronic ischemia. There is no intracranial hemorrhage. No mass effect or midline shift identified.  The visualized paranasal sinuses and mastoid air cells are well aerated. The calvarium is intact.  IMPRESSION: No acute intracranial pathology.  Age-related atrophy and chronic microvascular ischemic disease.   Electronically Signed   By: Anner Crete M.D.   On: 01/15/2015 17:26   01/16/15 ENDOSCOPIC IMPRESSION: 1. Normal EGD. No blood, bleeding, or mucosal abnormalities 2. Conceivable that he had bleeding from small AVM in the small intestine or colon. Could also have a colonic lesion. However, given her advanced age, frail state, and comorbidities would not pursue further workup. It may be that coming off  Coumadin alone (greater than 6 months since DVT) will suffice. He can stay on Plavix and aspirin    Assessment/Plan  Physical deconditioning Will have him work with physical therapy and occupational therapy team to help with gait training and muscle strengthening exercises.fall precautions. Skin care. Encourage to be out of bed.   GI bleed With anticoagulant, s/p blood and  FFP transfusion. Monitor clinically. EGD was negative. Monitor clinically. Continue Protonix 40 mg daily for now  supratherapeutic inr S/p 2 u FFP, stable, off coumadin now  Blood loss anemia Post melena and blood loss. S/p 4 u prbc and 2 u FFP transfusion. Continue ferrous sulfate daily, Check cbc  Leukocytosis No infection. Likely reactive leukocytosis. Monitor clinically  Hypokalemia Monitor bmp  Chronic renal failure, stage III Monitor renal function, s/p iv fluids in hospital  Protein calorie malnutrition Continue procel, encourage po intake, monitor weight  PE Breathing stable, off coumadin with gi bleed, monitor clinically, continue 02  Constipation continue Senna-S daily and miralax daily for now, no signs of acute abdomen on exam, hydration encouraged  CAD Remains chest pain free. continue ASA 81 mg daily with palvix 75 mg daily and prn NTG  And zocor 10 mg daily for now  Hypertension  Stable bp reading, continue Norvasc 10 mg daily and monitor   Goals of care: short term rehabilitation   Labs/tests ordered: cbc, bmp   Family/ staff Communication: reviewed care plan with patient and nursing supervisor    Blanchie Serve, MD  [facility] [phone] (Monday-Friday 8 am - 5 pm) [phone] (afterhours)

## 2015-01-31 ENCOUNTER — Non-Acute Institutional Stay (SKILLED_NURSING_FACILITY): Payer: Medicare HMO | Admitting: Internal Medicine

## 2015-01-31 ENCOUNTER — Encounter: Payer: Self-pay | Admitting: Internal Medicine

## 2015-01-31 DIAGNOSIS — K922 Gastrointestinal hemorrhage, unspecified: Secondary | ICD-10-CM | POA: Diagnosis not present

## 2015-01-31 DIAGNOSIS — D62 Acute posthemorrhagic anemia: Secondary | ICD-10-CM

## 2015-01-31 DIAGNOSIS — I951 Orthostatic hypotension: Secondary | ICD-10-CM | POA: Diagnosis not present

## 2015-01-31 NOTE — Progress Notes (Signed)
Patient ID: Larry Huffman, male   DOB: Dec 17, 1923, 81 y.o.   MRN: [id number]   [place]  Code Status: DNR  Allergies  Allergen Reactions  . Ciprofloxacin Rash  . Tape Other (See Comments)    Pulls skin off.  Please use "paper tape" only.   . Dapsone Other (See Comments)    Lowers WBC count?? "made me very sick"    Chief Complaint  Patient presents with  . Acute Visit    Drop in hemoglobin    HPI:  81 y.o. patient is here for short term rehabilitation post melena and supratherapeutic inr. EGD showed no evidence of bleed. He received 4 u PRBC an 2 u FFP in the hospital. he is currently on aspirin and plavix after clearance from GI. He has PMH of PE, CAD, CKD stage 3. He is seen in his room today. He is on o2, feels weak and tired. He had blood work few days back showing hb of 7.4. He has occasional dizziness with change of position.   Review of Systems:  Constitutional: Negative for fever, chills, diaphoresis.  HENT: Negative for headache, congestion, nasal discharge Eyes: Negative for eye pain, blurred vision, double vision and discharge.  Respiratory: Negative for cough, shortness of breath and wheezing.   Cardiovascular: Negative for chest pain, palpitations, leg swelling.  Gastrointestinal: Negative for heartburn, nausea, vomiting, abdominal pain, melena Genitourinary: Negative for dysuria. Has not paid attention to his urine for hematuria Musculoskeletal: Negative for back pain, falls Skin: Negative for itching, rash.  Neurological: Negative for tingling, focal weakness Psychiatric/Behavioral: Negative for depression  Past Medical History  Diagnosis Date  . Hypertension   . Acid reflux   . High cholesterol   . Coronary artery disease   . Arthritis   . Sleep apnea     CPAP  . Peripheral vascular disease     Remote left carotid endarterectomy  . Cancer hx of skin cancer  . HOH (hard of hearing)     wears hearing aids, but still can not hear  .  Incontinence of urine   . Constipation        Medication List       This list is accurate as of: 01/31/15 10:58 AM.  Always use your most recent med list.               amLODipine 10 MG tablet  Commonly known as:  NORVASC  Take 10 mg by mouth daily.     aspirin EC 81 MG tablet  Take 81 mg by mouth at bedtime.     atropine 1 % ophthalmic solution  Place 1 drop into the left eye 2 (two) times daily.     brimonidine 0.2 % ophthalmic solution  Commonly known as:  ALPHAGAN  Place 1 drop into the left eye 2 (two) times daily.     clopidogrel 75 MG tablet  Commonly known as:  PLAVIX  Take 1 tablet (75 mg total) by mouth daily.     ferrous sulfate 325 (65 FE) MG tablet  Take 325 mg by mouth daily with breakfast.     Fish Oil 1200 MG Caps  Take 1,200 mg by mouth daily.     montelukast 10 MG tablet  Commonly known as:  SINGULAIR  Take 10 mg by mouth at bedtime.     multivitamin with minerals Tabs tablet  Take 1 tablet by mouth every morning.     nitroGLYCERIN 0.4 MG SL  tablet  Commonly known as:  NITROSTAT  Place 1 tablet (0.4 mg total) under the tongue every 5 (five) minutes as needed for chest pain.     OCUVITE ADULT 50+ PO  Take by mouth.     pantoprazole 40 MG tablet  Commonly known as:  PROTONIX  Take 1 tablet (40 mg total) by mouth 2 (two) times daily.     polyethylene glycol packet  Commonly known as:  MIRALAX / GLYCOLAX  Take 17 g by mouth at bedtime.     prednisoLONE acetate 1 % ophthalmic suspension  Commonly known as:  PRED FORTE  Place 1 drop into the left eye 3 (three) times daily.     PROCEL 100 Powd  Take 1 scoop by mouth 2 (two) times daily. For nutritional support and to increase protein     PROVENTIL HFA 108 (90 BASE) MCG/ACT inhaler  Generic drug:  albuterol  Inhale 1-2 puffs into the lungs every 6 (six) hours as needed for wheezing or shortness of breath.     sennosides-docusate sodium 8.6-50 MG tablet  Commonly known as:  SENOKOT-S    Take 1 tablet by mouth daily.     simvastatin 10 MG tablet  Commonly known as:  ZOCOR  Take 1 tablet (10 mg total) by mouth at bedtime.     tolterodine 4 MG 24 hr capsule  Commonly known as:  DETROL LA  Take 4 mg by mouth daily.     valACYclovir 1000 MG tablet  Commonly known as:  VALTREX  Take 1 g by mouth daily.     VITAMIN B6 PO  Take 100 mg by mouth daily.        Physical exam BP 126/57 mmHg  Pulse 60  Temp(Src) 98.6 F (37 C) (Oral)  Resp 18  SpO2 94%  Lying down 175/77, 67/min Sitting 146/52, 59/ min Standing 123/53, 71/min   General- elderly male, in no acute distress Head- normocephalic, atraumatic Nose- no maxillary or frontal sinus tenderness, no nasal discharge Throat- moist mucus membrane Eyes- pallor present, no icterus, no discharge, normal conjunctiva, normal sclera Neck- no cervical lymphadenopathy Cardiovascular- normal s1,s2, no murmurs, palpable dorsalis pedis, trace leg edema Respiratory- bilateral clear to auscultation, no wheeze, no rhonchi, no crackles, no use of accessory muscles, on o2 Abdomen- bowel sounds present, soft, non tender Musculoskeletal- able to move all 4 extremities, generalized weakness Neurological- no focal deficit, alert and oriented  Skin- warm and dry Psychiatry- normal mood and affect  Labs reviewed  CBC Latest Ref Rng 01/27/2015 01/21/2015 01/20/2015  WBC - 9.8 11.9(H) 10.6(H)  Hemoglobin 13.5 - 17.5 g/dL 7.4(A) 8.5(L) 8.5(L)  Hematocrit 41 - 53 % 21(A) 25.0(L) 25.3(L)  Platelets 150 - 399 K/L 322 214 191   Lab Results  Component Value Date   CREATININE 0.9 01/27/2015   CREATININE 0.91 01/21/2015   CREATININE 0.97 01/20/2015   CMP Latest Ref Rng 01/27/2015 01/21/2015 01/20/2015  Glucose 65 - 99 mg/dL - 93 104(H)  BUN 4 - 21 mg/dL 16 19 26(H)  Creatinine 0.6 - 1.3 mg/dL 0.9 0.91 0.97  Sodium 137 - 147 mmol/L 134(A) 137 140  Potassium 3.4 - 5.3 mmol/L 3.8 3.4(L) 3.7  Chloride 101 - 111 mmol/L - 113(H) 117(H)   CO2 22 - 32 mmol/L - 16(L) 16(L)  Calcium 8.9 - 10.3 mg/dL - 7.7(L) 7.8(L)  Total Protein 6.5 - 8.1 g/dL - - -  Total Bilirubin 0.3 - 1.2 mg/dL - - -  Alkaline Phos 38 -  126 U/L - - -  AST 15 - 41 U/L - - -  ALT 17 - 63 U/L - - -     Assessment/plan  Acute anemia Acute drop in blood count with Hb of 7.4. Recent episode of gi bleed in hospital and required 4 u prbc transfusion. Concern of ongoing bleed. Hold aspirin and plavix for now. Stat cbc to assess further. Will need blood transfusion if Hb < 7  Gi bleed Recent episode of gi bleed requiring hospitalization. Checked guaiac stool and is positive. Hold aspirin and plavix and have him on protonix 40 mg bid for now. Stat cbc ordered with cmp  orthostasis Both on blood pressure and heart rate review. Patient denies any dizziness or lightheadedness. Will start iv fluids NS @ 100 cc/hr until cbc is resulted. Monitor vitals for now. If Hb < 7 will have him transferred to the ED for blood transfusion and GI workup  Reviewed care plan with patient, nursing staff and DON.  Total time: 45 minutes, >50% of total time this visit spent on review of chart and co-ordination of care   Blanchie Serve, MD  [facility] [phone] (Monday-Friday 8 am - 5 pm) [phone] (afterhours)

## 2015-02-14 ENCOUNTER — Non-Acute Institutional Stay (SKILLED_NURSING_FACILITY): Payer: Medicare HMO | Admitting: Adult Health

## 2015-02-14 ENCOUNTER — Encounter: Payer: Self-pay | Admitting: Adult Health

## 2015-02-14 DIAGNOSIS — K922 Gastrointestinal hemorrhage, unspecified: Secondary | ICD-10-CM | POA: Diagnosis not present

## 2015-02-14 DIAGNOSIS — I2510 Atherosclerotic heart disease of native coronary artery without angina pectoris: Secondary | ICD-10-CM | POA: Diagnosis not present

## 2015-02-14 DIAGNOSIS — I2699 Other pulmonary embolism without acute cor pulmonale: Secondary | ICD-10-CM | POA: Diagnosis not present

## 2015-02-14 DIAGNOSIS — I10 Essential (primary) hypertension: Secondary | ICD-10-CM

## 2015-02-14 DIAGNOSIS — E46 Unspecified protein-calorie malnutrition: Secondary | ICD-10-CM

## 2015-02-14 DIAGNOSIS — D62 Acute posthemorrhagic anemia: Secondary | ICD-10-CM | POA: Diagnosis not present

## 2015-02-14 DIAGNOSIS — R32 Unspecified urinary incontinence: Secondary | ICD-10-CM

## 2015-02-14 DIAGNOSIS — N183 Chronic kidney disease, stage 3 (moderate): Secondary | ICD-10-CM | POA: Diagnosis not present

## 2015-02-14 DIAGNOSIS — J309 Allergic rhinitis, unspecified: Secondary | ICD-10-CM | POA: Diagnosis not present

## 2015-02-14 DIAGNOSIS — R531 Weakness: Secondary | ICD-10-CM | POA: Diagnosis not present

## 2015-02-14 DIAGNOSIS — Z8619 Personal history of other infectious and parasitic diseases: Secondary | ICD-10-CM | POA: Diagnosis not present

## 2015-02-14 DIAGNOSIS — G4733 Obstructive sleep apnea (adult) (pediatric): Secondary | ICD-10-CM | POA: Diagnosis not present

## 2015-02-14 DIAGNOSIS — K5900 Constipation, unspecified: Secondary | ICD-10-CM | POA: Diagnosis not present

## 2015-02-14 DIAGNOSIS — E785 Hyperlipidemia, unspecified: Secondary | ICD-10-CM

## 2015-02-21 ENCOUNTER — Ambulatory Visit (INDEPENDENT_AMBULATORY_CARE_PROVIDER_SITE_OTHER): Payer: Medicare HMO | Admitting: Pharmacist Clinician (PhC)/ Clinical Pharmacy Specialist

## 2015-02-21 DIAGNOSIS — Z7901 Long term (current) use of anticoagulants: Secondary | ICD-10-CM

## 2015-02-21 DIAGNOSIS — I2699 Other pulmonary embolism without acute cor pulmonale: Secondary | ICD-10-CM

## 2015-02-21 NOTE — Progress Notes (Signed)
Pt had warfarin d/c after hospital stay brought on by 2 falls within 1 week, second of which caused excessive bleeding in arm.  When at ER pt was also noted to have melena.  At discharge warfarin was discontinued.  Per records, pt had previous PE in October 2015.  Daughter states that shortly before this they were told he had a DVT, but I see no record of this.  PE was in setting of recent back surgery.  Will review with Dr. Gwenlyn Found, but I don't think he is a good candidate for anticoagulation at this time.

## 2015-02-21 NOTE — Patient Instructions (Signed)
For primary care MD try [facility] [phone].   [facility] and [facility] both have offices in [place]

## 2015-03-15 ENCOUNTER — Emergency Department (HOSPITAL_COMMUNITY): Payer: Medicare HMO

## 2015-03-15 ENCOUNTER — Encounter (HOSPITAL_COMMUNITY): Payer: Self-pay | Admitting: Emergency Medicine

## 2015-03-15 ENCOUNTER — Emergency Department (HOSPITAL_COMMUNITY)
Admission: EM | Admit: 2015-03-15 | Discharge: 2015-03-15 | Payer: Medicare HMO | Source: Home / Self Care | Attending: Family Medicine | Admitting: Family Medicine

## 2015-03-15 ENCOUNTER — Emergency Department (HOSPITAL_COMMUNITY)
Admission: EM | Admit: 2015-03-15 | Discharge: 2015-03-15 | Disposition: A | Discharge status: Home / Self Care | Payer: Medicare HMO | Attending: Emergency Medicine | Admitting: Emergency Medicine

## 2015-03-15 DIAGNOSIS — Z859 Personal history of malignant neoplasm, unspecified: Secondary | ICD-10-CM | POA: Insufficient documentation

## 2015-03-15 DIAGNOSIS — Z7982 Long term (current) use of aspirin: Secondary | ICD-10-CM | POA: Diagnosis not present

## 2015-03-15 DIAGNOSIS — E782 Mixed hyperlipidemia: Secondary | ICD-10-CM | POA: Diagnosis not present

## 2015-03-15 DIAGNOSIS — K5901 Slow transit constipation: Secondary | ICD-10-CM | POA: Insufficient documentation

## 2015-03-15 DIAGNOSIS — Z87891 Personal history of nicotine dependence: Secondary | ICD-10-CM | POA: Insufficient documentation

## 2015-03-15 DIAGNOSIS — K6289 Other specified diseases of anus and rectum: Secondary | ICD-10-CM | POA: Diagnosis present

## 2015-03-15 DIAGNOSIS — I2510 Atherosclerotic heart disease of native coronary artery without angina pectoris: Secondary | ICD-10-CM | POA: Insufficient documentation

## 2015-03-15 DIAGNOSIS — I10 Essential (primary) hypertension: Secondary | ICD-10-CM | POA: Diagnosis not present

## 2015-03-15 DIAGNOSIS — Z79899 Other long term (current) drug therapy: Secondary | ICD-10-CM | POA: Diagnosis not present

## 2015-03-15 DIAGNOSIS — K5909 Other constipation: Secondary | ICD-10-CM

## 2015-03-15 DIAGNOSIS — Z7902 Long term (current) use of antithrombotics/antiplatelets: Secondary | ICD-10-CM | POA: Diagnosis not present

## 2015-03-15 NOTE — ED Notes (Signed)
Pt verbalized understanding of d/c instructions and has no further questions. Pt stable and NAD.  

## 2015-03-15 NOTE — ED Notes (Signed)
Pt called out and stated that he had to use the bathroom. Pt assisted to bedside commode.

## 2015-03-15 NOTE — ED Notes (Addendum)
Pt given soap suds enema and instructed to hold and call out when he had to use the bathroom. Bedside commode at bedside.

## 2015-03-15 NOTE — ED Provider Notes (Signed)
CSN: [id number]     Arrival date & time 03/15/15  1835 History   First MD Initiated Contact with Patient 03/15/15 1856     Chief Complaint  Patient presents with  . Constipation   (Consider location/radiation/quality/duration/timing/severity/associated sxs/prior Treatment) HPI  Constipation. No bowel movement for 3 days. Patient states he has significant abdominal discomfort but denies actual pain per patient. Patient states he typically has 3-4 bowel movements per week. Patient now becoming nauseous. Typically firm. Senokot without improvement. Still able to tolerate oral intake. Denies any fevers, chest pain, shortness of breath, palpitations, dysuria, frequency, back pain.  Past Medical History  Diagnosis Date  . Hypertension   . Acid reflux   . High cholesterol   . Coronary artery disease   . Arthritis   . Sleep apnea     CPAP  . Peripheral vascular disease     Remote left carotid endarterectomy  . Cancer hx of skin cancer  . HOH (hard of hearing)     wears hearing aids, but still can not hear  . Incontinence of urine   . Constipation    Past Surgical History  Procedure Laterality Date  . Rotator cuff repair    . Prostate surgery    . Laparotomy    . Coronary stent placement    . Nasal sinus surgery    . Anal fissure repair    . Eye surgery      cataracts  . Irrigation and debridement abscess    . I&d extremity  06/28/2012    Procedure: IRRIGATION AND DEBRIDEMENT EXTREMITY;  Surgeon: Newt Minion, MD;  Location: [place];  Service: Orthopedics;  Laterality: Right;  Debridement Wound Right Leg, VAC, Antibiotic Beads, Apply A-Cell  . 2-d echocardiogram  03/07/2008    Ejection fraction greater than 55%. Mild concentric left ventricular hypertrophy.LV relaxation. mildly dilated left atrium. Mild mitral regurgitation.   . Cardiac catheterization      X 2 stents  . Colonoscopy w/ polypectomy    . Colon surgery      removed part of the small intestine  . Lumbar laminectomy  N/A 02/08/2014    Procedure: L3-4, L4-5 Decompression;  Surgeon: Marybelle Killings, MD;  Location: [place];  Service: Orthopedics;  Laterality: N/A;  L3-4, L4-5 Decompression  . Esophagogastroduodenoscopy N/A 01/16/2015    Procedure: ESOPHAGOGASTRODUODENOSCOPY (EGD);  Surgeon: Irene Shipper, MD;  Location: [facility] ENDOSCOPY;  Service: Endoscopy;  Laterality: N/A;   Family History  Problem Relation Age of Onset  . Hypertension Mother    Social History  Substance Use Topics  . Smoking status: Former Smoker    Types: Cigarettes    Quit date: 07/12/1978  . Smokeless tobacco: Never Used  . Alcohol Use: No    Review of Systems Per HPI with all other pertinent systems negative.   Allergies  Ciprofloxacin; Tape; and Dapsone  Home Medications   Prior to Admission medications   Medication Sig Start Date End Date Taking? Authorizing Provider  amLODipine (NORVASC) 10 MG tablet Take 10 mg by mouth daily.    Historical Provider, MD  aspirin EC 81 MG tablet Take 81 mg by mouth at bedtime.     Historical Provider, MD  atropine 1 % ophthalmic solution Place 1 drop into the left eye 2 (two) times daily.  12/16/14   Historical Provider, MD  brimonidine (ALPHAGAN) 0.2 % ophthalmic solution Place 1 drop into the left eye 2 (two) times daily.  12/16/14   Historical Provider, MD  clopidogrel (PLAVIX) 75 MG tablet Take 1 tablet (75 mg total) by mouth daily. Patient not taking: Reported on 01/31/2015 02/12/14   Lorretta Harp, MD  ferrous sulfate 325 (65 FE) MG tablet Take 325 mg by mouth daily with breakfast.    Historical Provider, MD  montelukast (SINGULAIR) 10 MG tablet Take 10 mg by mouth at bedtime.    Historical Provider, MD  Multiple Vitamin (MULTIVITAMIN WITH MINERALS) TABS Take 1 tablet by mouth every morning.     Historical Provider, MD  Multiple Vitamins-Minerals (OCUVITE ADULT 50+ PO) Take by mouth.    Historical Provider, MD  nitroGLYCERIN (NITROSTAT) 0.4 MG SL tablet Place 1 tablet (0.4 mg total) under the  tongue every 5 (five) minutes as needed for chest pain. 08/08/13   Lorretta Harp, MD  Omega-3 Fatty Acids (FISH OIL) 1200 MG CAPS Take 1,200 mg by mouth daily.     Historical Provider, MD  pantoprazole (PROTONIX) 40 MG tablet Take 1 tablet (40 mg total) by mouth 2 (two) times daily. 01/31/15   Blanchie Serve, MD  polyethylene glycol (MIRALAX / GLYCOLAX) packet Take 17 g by mouth at bedtime.     Historical Provider, MD  prednisoLONE acetate (PRED FORTE) 1 % ophthalmic suspension Place 1 drop into the left eye 3 (three) times daily.     Historical Provider, MD  Protein (PROCEL 100) POWD Take 1 scoop by mouth 2 (two) times daily. For nutritional support and to increase protein    Historical Provider, MD  PROVENTIL HFA 108 (90 BASE) MCG/ACT inhaler Inhale 1-2 puffs into the lungs every 6 (six) hours as needed for wheezing or shortness of breath.  08/01/14   Historical Provider, MD  Pyridoxine HCl (VITAMIN B6 PO) Take 100 mg by mouth daily.     Historical Provider, MD  sennosides-docusate sodium (SENOKOT-S) 8.6-50 MG tablet Take 1 tablet by mouth daily.  12/03/14   Historical Provider, MD  simvastatin (ZOCOR) 10 MG tablet Take 1 tablet (10 mg total) by mouth at bedtime. 02/12/14   Lorretta Harp, MD  tolterodine (DETROL LA) 4 MG 24 hr capsule Take 4 mg by mouth daily. 03/12/13   Historical Provider, MD  valACYclovir (VALTREX) 1000 MG tablet Take 1 g by mouth daily. 12/16/14   Historical Provider, MD   Meds Ordered and Administered this Visit  Medications - No data to display  BP 159/73 mmHg  Pulse 77  Temp(Src) 97.7 F (36.5 C) (Oral)  Resp 22  SpO2 96% No data found.   Physical Exam Physical Exam  Constitutional: Patient moaning in pain. HENT:  Head: Normocephalic and atraumatic.  Eyes: EOMI. PERRL.  Neck: Normal range of motion.  Cardiovascular: RRR, no m/r/g, 2+ distal pulses,  Pulmonary/Chest: Effort normal and breath sounds normal. No respiratory distress.  Abdominal: Mild abdominal  distention, active bowel sounds. Discomfort with palpation.  Musculoskeletal: Normal range of motion. Non ttp, no effusion.  Neurological: alert and oriented to person, place, and time.  Skin: Skin is warm. No rash noted. non diaphoretic.  Psychiatric: normal mood and affect. behavior is normal. Judgment and thought content normal.   ED Course  Procedures (including critical care time)  Labs Review Labs Reviewed - No data to display  Imaging Review No results found.   Visual Acuity Review  Right Eye Distance:   Left Eye Distance:   Bilateral Distance:    Right Eye Near:   Left Eye Near:    Bilateral Near:  MDM   1. Other constipation     Patient moaning in discomfort. It unwilling to sit in exam room due to symptoms. Tried having a bowel movement 3-4 times while here the urgent care unsuccessfully. Patient unable to sit long enough or hold still long enough to get a KUB. While I do not think the patient has an acute abdomen is likely very severely constipated and will likely need a cleanout and more thorough workup for etiology of symptoms. Given his chronic comorbidities and current status I feel that this is best done in the ED. Will send patient by transport.   Waldemar Dickens, MD 03/15/15 [phone]

## 2015-03-15 NOTE — ED Provider Notes (Signed)
CSN: [id number]     Arrival date & time 03/15/15  1954 History   First MD Initiated Contact with Patient 03/15/15 2033     Chief Complaint  Patient presents with  . Rectal Pain  . Fecal Impaction     (Consider location/radiation/quality/duration/timing/severity/associated sxs/prior Treatment) HPI Comments: Patient presents transferred from urgent care with 3 days of no bowel movement and abdominal discomfort, crampy in nature.  His daughter is his primary caregiver gave him a Senokot this afternoon.  He also has a prescription from your laxity uses as needed, but has not taken any in several days.  He's been eating normally and urinating without difficulty  The history is provided by the patient.    Past Medical History  Diagnosis Date  . Hypertension   . Acid reflux   . High cholesterol   . Coronary artery disease   . Arthritis   . Sleep apnea     CPAP  . Peripheral vascular disease     Remote left carotid endarterectomy  . Cancer hx of skin cancer  . HOH (hard of hearing)     wears hearing aids, but still can not hear  . Incontinence of urine   . Constipation    Past Surgical History  Procedure Laterality Date  . Rotator cuff repair    . Prostate surgery    . Laparotomy    . Coronary stent placement    . Nasal sinus surgery    . Anal fissure repair    . Eye surgery      cataracts  . Irrigation and debridement abscess    . I&d extremity  06/28/2012    Procedure: IRRIGATION AND DEBRIDEMENT EXTREMITY;  Surgeon: Newt Minion, MD;  Location: [place];  Service: Orthopedics;  Laterality: Right;  Debridement Wound Right Leg, VAC, Antibiotic Beads, Apply A-Cell  . 2-d echocardiogram  03/07/2008    Ejection fraction greater than 55%. Mild concentric left ventricular hypertrophy.LV relaxation. mildly dilated left atrium. Mild mitral regurgitation.   . Cardiac catheterization      X 2 stents  . Colonoscopy w/ polypectomy    . Colon surgery      removed part of the small  intestine  . Lumbar laminectomy N/A 02/08/2014    Procedure: L3-4, L4-5 Decompression;  Surgeon: Marybelle Killings, MD;  Location: [place];  Service: Orthopedics;  Laterality: N/A;  L3-4, L4-5 Decompression  . Esophagogastroduodenoscopy N/A 01/16/2015    Procedure: ESOPHAGOGASTRODUODENOSCOPY (EGD);  Surgeon: Irene Shipper, MD;  Location[facility] ENDOSCOPY;  Service: Endoscopy;  Laterality: N/A;   Family History  Problem Relation Age of Onset  . Hypertension Mother    Social History  Substance Use Topics  . Smoking status: Former Smoker    Types: Cigarettes    Quit date: 07/12/1978  . Smokeless tobacco: Never Used  . Alcohol Use: No    Review of Systems  Constitutional: Negative for fever and chills.  Respiratory: Negative for shortness of breath.   Cardiovascular: Negative for chest pain.  Gastrointestinal: Positive for abdominal pain and constipation. Negative for nausea, vomiting and anal bleeding.  Genitourinary: Negative for dysuria.  Skin: Positive for pallor. Negative for wound.  All other systems reviewed and are negative.     Allergies  Ciprofloxacin; Tape; and Dapsone  Home Medications   Prior to Admission medications   Medication Sig Start Date End Date Taking? Authorizing Provider  acetaminophen (TYLENOL) 500 MG tablet Take 500 mg by mouth every 6 (six) hours as  needed (pain).   Yes Historical Provider, MD  albuterol (PROVENTIL HFA;VENTOLIN HFA) 108 (90 BASE) MCG/ACT inhaler Inhale 1 puff into the lungs every 6 (six) hours as needed for wheezing or shortness of breath.   Yes Historical Provider, MD  amLODipine (NORVASC) 10 MG tablet Take 10 mg by mouth daily.   Yes Historical Provider, MD  aspirin EC 81 MG tablet Take 81 mg by mouth at bedtime.    Yes Historical Provider, MD  clopidogrel (PLAVIX) 75 MG tablet Take 1 tablet (75 mg total) by mouth daily. 02/12/14  Yes Lorretta Harp, MD  diphenhydrAMINE (BENADRYL) 25 MG tablet Take 25 mg by mouth 2 (two) times daily as needed for  itching.   Yes Historical Provider, MD  ferrous sulfate 325 (65 FE) MG tablet Take 325 mg by mouth daily with breakfast.   Yes Historical Provider, MD  montelukast (SINGULAIR) 10 MG tablet Take 10 mg by mouth at bedtime.   Yes Historical Provider, MD  Multiple Vitamin (MULTIVITAMIN WITH MINERALS) TABS Take 1 tablet by mouth daily. [employer] brand multivitamin   Yes Historical Provider, MD  Multiple Vitamins-Minerals (OCUVITE ADULT 50+ PO) Take 1 tablet by mouth daily.    Yes Historical Provider, MD  nitroGLYCERIN (NITROSTAT) 0.4 MG SL tablet Place 1 tablet (0.4 mg total) under the tongue every 5 (five) minutes as needed for chest pain. 08/08/13  Yes Lorretta Harp, MD  Omega-3 Fatty Acids (FISH OIL) 1200 MG CAPS Take 1,200 mg by mouth daily.    Yes Historical Provider, MD  OVER THE COUNTER MEDICATION Place 1 drop into both eyes daily as needed (itching). Over the counter allergy eye drops   Yes Historical Provider, MD  pantoprazole (PROTONIX) 40 MG tablet Take 40 mg by mouth daily.  01/31/15  Yes Mahima Pandey, MD  polyethylene glycol (MIRALAX / GLYCOLAX) packet Take 17 g by mouth daily as needed (constipation).    Yes Historical Provider, MD  prednisoLONE acetate (PRED FORTE) 1 % ophthalmic suspension Place 1 drop into the left eye 2 (two) times daily.    Yes Historical Provider, MD  pyridOXINE (VITAMIN B-6) 50 MG tablet Take 50 mg by mouth daily.   Yes Historical Provider, MD  sennosides-docusate sodium (SENOKOT-S) 8.6-50 MG tablet Take 1 tablet by mouth daily as needed for constipation.  12/03/14  Yes Historical Provider, MD  simvastatin (ZOCOR) 10 MG tablet Take 1 tablet (10 mg total) by mouth at bedtime. 02/12/14  Yes Lorretta Harp, MD  tolterodine (DETROL LA) 4 MG 24 hr capsule Take 4 mg by mouth daily. 03/12/13  Yes Historical Provider, MD   BP 149/57 mmHg  Pulse 76  Temp(Src) 98.2 F (36.8 C) (Oral)  Resp 16  Ht 5\' 8"  (1.727 m)  Wt 164 lb (74.39 kg)  BMI 24.94 kg/m2  SpO2 100% Physical Exam   Constitutional: He appears well-developed.  HENT:  Head: Normocephalic.  Eyes: Pupils are equal, round, and reactive to light.  Neck: Normal range of motion.  Cardiovascular: Normal rate.   Pulmonary/Chest: Effort normal.  Abdominal: Soft. Bowel sounds are normal. He exhibits distension. There is generalized tenderness.  Genitourinary: Rectal exam shows anal tone abnormal. Rectal exam shows no external hemorrhoid, no internal hemorrhoid, no fissure and no tenderness.  Soft stool in vault   Neurological: He is alert.  Skin: Skin is warm. There is pallor.    ED Course  Procedures (including critical care time) Labs Review Labs Reviewed - No data to display  Imaging  Review No results found. I have personally reviewed and evaluated these images and lab results as part of my medical decision-making.   EKG Interpretation None     Patient had large soft formed stool.  Feels much better after soapsuds enema will be discharged home with recommendation to use Metamucil/Mira lax on a regular basis.  Follow-up with his doctor MDM   Final diagnoses:  Slow transit constipation         Junius Creamer, NP 03/15/15 2220  Milton Ferguson, MD 03/15/15 2330

## 2015-03-15 NOTE — ED Notes (Signed)
Pt here with c/o constipation x 3 days Took Senna- S without relief  Abdomen firm  Pt ambulated with walker

## 2015-03-15 NOTE — ED Notes (Signed)
Patient here with constipation and rectal pain. States that he has had some discomfort there for about 2 days, but family member states that he began to complain of pain today about 1500. Patient in severe pain in triage and cannot sit still. Reports surgical repair of anal fissures.

## 2015-03-15 NOTE — Discharge Instructions (Signed)
Constipation Constipation is when a person:  Poops (has a bowel movement) less than 3 times a week.  Has a hard time pooping.  Has poop that is dry, hard, or bigger than normal. HOME CARE   Eat foods with a lot of fiber in them. This includes fruits, vegetables, beans, and whole grains such as brown rice.  Avoid fatty foods and foods with a lot of sugar. This includes french fries, hamburgers, cookies, candy, and soda.  If you are not getting enough fiber from food, take products with added fiber in them (supplements).  Drink enough fluid to keep your pee (urine) clear or pale yellow.  Exercise on a regular basis, or as told by your doctor.  Go to the restroom when you feel like you need to poop. Do not hold it.  Only take medicine as told by your doctor. Do not take medicines that help you poop (laxatives) without talking to your doctor first. GET HELP RIGHT AWAY IF:   You have bright red blood in your poop (stool).  Your constipation lasts more than 4 days or gets worse.  You have belly (abdominal) or butt (rectal) pain.  You have thin poop (as thin as a pencil).  You lose weight, and it cannot be explained. MAKE SURE YOU:   Understand these instructions.  Will watch your condition.  Will get help right away if you are not doing well or get worse. Document Released: 12/15/2007 Document Revised: 07/03/2013 Document Reviewed: 04/09/2013 [facility] Patient Information 2015 [place], [place]. This information is not intended to replace advice given to you by your health care provider. Make sure you discuss any questions you have with your health care provider. The next week or so.  Please use your lax a regular basis.  If you find that stools become too frequent, or watery,  cut back to every other day

## 2015-03-17 ENCOUNTER — Other Ambulatory Visit: Payer: Self-pay | Admitting: Cardiovascular Disease

## 2015-03-19 ENCOUNTER — Encounter: Payer: Self-pay | Admitting: Cardiovascular Disease

## 2015-03-19 ENCOUNTER — Ambulatory Visit (INDEPENDENT_AMBULATORY_CARE_PROVIDER_SITE_OTHER): Payer: Medicare HMO | Admitting: Cardiovascular Disease

## 2015-03-19 VITALS — BP 150/58 | HR 69 | Ht 67.0 in | Wt 165.4 lb

## 2015-03-19 DIAGNOSIS — I739 Peripheral vascular disease, unspecified: Secondary | ICD-10-CM | POA: Diagnosis not present

## 2015-03-19 DIAGNOSIS — I2510 Atherosclerotic heart disease of native coronary artery without angina pectoris: Secondary | ICD-10-CM

## 2015-03-19 DIAGNOSIS — E785 Hyperlipidemia, unspecified: Secondary | ICD-10-CM

## 2015-03-19 DIAGNOSIS — I2699 Other pulmonary embolism without acute cor pulmonale: Secondary | ICD-10-CM

## 2015-03-19 DIAGNOSIS — I10 Essential (primary) hypertension: Secondary | ICD-10-CM

## 2015-03-19 DIAGNOSIS — I2583 Coronary atherosclerosis due to lipid rich plaque: Secondary | ICD-10-CM

## 2015-03-19 MED ORDER — PANTOPRAZOLE SODIUM 40 MG PO TBEC: 40.0000 mg | DELAYED_RELEASE_TABLET | Freq: Every day | ORAL | Status: DC

## 2015-03-19 MED ORDER — CLOPIDOGREL BISULFATE 75 MG PO TABS: 75.0000 mg | ORAL_TABLET | Freq: Every day | ORAL | Status: DC

## 2015-03-19 MED ORDER — SIMVASTATIN 10 MG PO TABS: 10.0000 mg | ORAL_TABLET | Freq: Every day | ORAL | Status: DC

## 2015-03-19 NOTE — Assessment & Plan Note (Signed)
Larry Huffman was on Coumadin for pulmonary embolus the past which has since been discontinued

## 2015-03-19 NOTE — Assessment & Plan Note (Signed)
History of carotid artery disease status post left carotid endarterectomy in the past. His last carotid Doppler in our office was performed 10/02/13 revealing a widely patent endarterectomy site with moderate bilateral ICA stenosis.

## 2015-03-19 NOTE — Assessment & Plan Note (Signed)
History of hyperlipidemia on simvastatin 10 mg a day followed by his PCP 

## 2015-03-19 NOTE — Assessment & Plan Note (Signed)
History of hypertension with blood pressure measured today at 150/58. He is on amlodipine. Continue current meds at current dosing

## 2015-03-19 NOTE — Patient Instructions (Signed)
We request that you follow-up in: 6 months with an extender and in 1 year with Dr Berry  You will receive a reminder letter in the mail two months in advance. If you don't receive a letter, please call our office to schedule the follow-up appointment.   

## 2015-03-19 NOTE — Progress Notes (Signed)
03/19/2015 Larry Huffman   25-Dec-1923  [id number]  Primary Physician Larry Ou, MD Primary Cardiologist: Larry Harp MD Larry Huffman   HPI:  The patient returns today for followup. He is an 81 year old mildly overweight, married Caucasian male, father of 2, grandfather to 8 grandchildren who I saw in the office 09/24/14. He is accompanied by his eldest son Larry Huffman today. He has a history of CAD status post LAD and circumflex stenting in the past with drug-eluting stents and subsequent negative Myoview as recently as August 17, 2012. He has had left carotid endarterectomy remotely as well which we follow by duplex ultrasound. This was most recently done this past March of last year and was widely patent. He has normal lower extremity arterial Dopplers and venous Dopplers suggesting venous insufficiency. He has obstructive sleep apnea on CPAP followed by Larry Huffman. He saw Dr. Mali Huffman who thought he was a good candidate for endovenous ablation. He had admission in October of last year for acute pulmonary embolism and has been on Coumadin at coagulation. He really needs a urologic procedure and had a pharmacologic Myoview stress test performed in January of this year which was low with a nonischemic. Since I saw him back he has fallen and injured his head with excessive bleeding. He also developed shingles. His Coumadin has since been discontinued. He denies chest pain or shortness of breath.   Current Outpatient Prescriptions  Medication Sig Dispense Refill  . acetaminophen (TYLENOL) 500 MG tablet Take 500 mg by mouth every 6 (six) hours as needed (pain).    Marland Kitchen albuterol (PROVENTIL HFA;VENTOLIN HFA) 108 (90 BASE) MCG/ACT inhaler Inhale 1 puff into the lungs every 6 (six) hours as needed for wheezing or shortness of breath.    Marland Kitchen amLODipine (NORVASC) 10 MG tablet Take 10 mg by mouth daily.    Marland Kitchen aspirin EC 81 MG tablet Take 81 mg by mouth at bedtime.     . clopidogrel (PLAVIX)  75 MG tablet TAKE 1 TABLET EVERY DAY 90 tablet 0  . diphenhydrAMINE (BENADRYL) 25 MG tablet Take 25 mg by mouth 2 (two) times daily as needed for itching.    . ferrous sulfate 325 (65 FE) MG tablet Take 325 mg by mouth daily with breakfast.    . montelukast (SINGULAIR) 10 MG tablet Take 10 mg by mouth at bedtime.    . Multiple Vitamin (MULTIVITAMIN WITH MINERALS) TABS Take 1 tablet by mouth daily. [employer] brand multivitamin    . Multiple Vitamins-Minerals (OCUVITE ADULT 50+ PO) Take 1 tablet by mouth daily.     . nitroGLYCERIN (NITROSTAT) 0.4 MG SL tablet Place 1 tablet (0.4 mg total) under the tongue every 5 (five) minutes as needed for chest pain. 25 tablet 2  . Omega-3 Fatty Acids (FISH OIL) 1200 MG CAPS Take 1,200 mg by mouth daily.     Marland Kitchen OVER THE COUNTER MEDICATION Place 1 drop into both eyes daily as needed (itching). Over the counter allergy eye drops    . pantoprazole (PROTONIX) 40 MG tablet TAKE 1 TABLET EVERY DAY 90 tablet 0  . polyethylene glycol (MIRALAX / GLYCOLAX) packet Take 17 g by mouth daily as needed (constipation).     . prednisoLONE acetate (PRED FORTE) 1 % ophthalmic suspension Place 1 drop into the left eye 2 (two) times daily.     Marland Kitchen pyridOXINE (VITAMIN B-6) 50 MG tablet Take 50 mg by mouth daily.    . sennosides-docusate sodium (SENOKOT-S) 8.6-50 MG  tablet Take 1 tablet by mouth daily as needed for constipation.     . simvastatin (ZOCOR) 10 MG tablet TAKE 1 TABLET AT BEDTIME 90 tablet 0  . tolterodine (DETROL LA) 4 MG 24 hr capsule Take 4 mg by mouth daily.     No current facility-administered medications for this visit.    Allergies  Allergen Reactions  . Ciprofloxacin Rash  . Tape Other (See Comments)    Pulls skin off.  Please use "paper tape" only.   . Dapsone Other (See Comments)    Lowers WBC count?? "made me very sick"    Social History   Social History  . Marital Status: Widowed    Spouse Name: N/A  . Number of Children: N/A  . Years of Education: N/A     Occupational History  . Not on file.   Social History Main Topics  . Smoking status: Former Smoker    Types: Cigarettes    Quit date: 07/12/1978  . Smokeless tobacco: Never Used  . Alcohol Use: No  . Drug Use: No  . Sexual Activity: Not on file   Other Topics Concern  . Not on file   Social History Narrative     Review of Systems: General: negative for chills, fever, night sweats or weight changes.  Cardiovascular: negative for chest pain, dyspnea on exertion, edema, orthopnea, palpitations, paroxysmal nocturnal dyspnea or shortness of breath Dermatological: negative for rash Respiratory: negative for cough or wheezing Urologic: negative for hematuria Abdominal: negative for nausea, vomiting, diarrhea, bright red blood per rectum, melena, or hematemesis Neurologic: negative for visual changes, syncope, or dizziness All other systems reviewed and are otherwise negative except as noted above.    Blood pressure 150/58, pulse 69, height 5\' 7"  (1.702 m), weight 165 lb 6.4 oz (75.025 kg).  General appearance: alert Neck: no adenopathy, no carotid bruit, no JVD, supple, symmetrical, trachea midline and thyroid not enlarged, symmetric, no tenderness/mass/nodules Lungs: clear to auscultation bilaterally Heart: regular rate and rhythm, S1, S2 normal, no murmur, click, rub or gallop Extremities: extremities normal, atraumatic, no cyanosis or edema Skin: Skin color, texture, turgor normal. No rashes or lesions Neurologic: Grossly normal  EKG not performed today  ASSESSMENT AND PLAN:   Pulmonary embolism Larry Huffman was on Coumadin for pulmonary embolus the past which has since been discontinued  Peripheral arterial disease: History of left carotid endarterectomy History of carotid artery disease status post left carotid endarterectomy in the past. His last carotid Doppler in our office was performed 10/02/13 revealing a widely patent endarterectomy site with moderate bilateral ICA  stenosis.  HLD (hyperlipidemia) History of hyperlipidemia on simvastatin 10 mg a day followed by his PCP  Essential hypertension History of hypertension with blood pressure measured today at 150/58. He is on amlodipine. Continue current meds at current dosing  CAD - CFX DES 7/07, LAD DES 10/08, cath 11/11- medical Rx History of CAD status post circumflex stenting with DES stent July 2007 and LAD stenting with DES stent October 2008. His last Myoview performed 08/06/14's showed a small inferolateral scar without ischemia. He denies chest pain or shortness of breath.      Larry Harp MD FACP,FACC,FAHA, [facility] 03/19/2015 11:29 AM

## 2015-03-19 NOTE — Assessment & Plan Note (Signed)
History of CAD status post circumflex stenting with DES stent July 2007 and LAD stenting with DES stent October 2008. His last Myoview performed 08/06/14's showed a small inferolateral scar without ischemia. He denies chest pain or shortness of breath.

## 2015-03-23 NOTE — Progress Notes (Signed)
Patient ID: Larry Huffman, male   DOB: 1923-10-07, 81 y.o.   MRN: [id number]    DATE:  02/14/15 MRN:  [id number]  BIRTHDAY: 13-Jun-1924  Facility:  [facility]urs[facility] Home Location:  [place] Room Number: 962-8  LEVEL OF CARE:  SNF [phone])  Contact Information    Name Relation Home Work Crowder Daughter [phone]   Anzel, Kearse   [phone]       Chief Complaint  Patient presents with  . Discharge Note    Generalized weakness, GI Bleed, Anemia, Chronic renal failure stage III, PE history, OSA and history of shingles    HISTORY OF PRESENT ILLNESS:   This is an 81 year old male who is for discharge home with home health PT for endurance, OT for ADLs and CNA for showers. He has been [facility] on 01/23/15 from home. He was discharged home from the hospital on 01/21/15. He fell out of bed. He has been weak, tired, decreased appetite, nausea and diarrhea. He was noticed to have melena in the ED. He was taking Coumadin for PE which was diagnosed in 10/15. Work up in the ED shows hgb 6, wbc 15, EKG SR, CT head negative for intracranial bleed, creatinine 1.4, lactic acid 16 and INR 3.1 He was given 2 units FFP. EGD done showed no evidence of bleeding.   Patient was admitted to this facility for short-term rehabilitation after the patient's recent hospitalization.  Patient has completed SNF rehabilitation and therapy has cleared the patient for discharge.   PAST MEDICAL HISTORY:  Past Medical History  Diagnosis Date  . Hypertension   . Acid reflux   . High cholesterol   . Coronary artery disease   . Arthritis   . Sleep apnea     CPAP  . Peripheral vascular disease     Remote left carotid endarterectomy  . Cancer hx of skin cancer  . HOH (hard of hearing)     wears hearing aids, but still can not hear  . Incontinence of urine   . Constipation      CURRENT MEDICATIONS: Reviewed  Patient's  Medications  New Prescriptions   No medications on file  Previous Medications   ACETAMINOPHEN (TYLENOL) 500 MG TABLET    Take 500 mg by mouth every 6 (six) hours as needed (pain).   ALBUTEROL (PROVENTIL HFA;VENTOLIN HFA) 108 (90 BASE) MCG/ACT INHALER    Inhale 1 puff into the lungs every 6 (six) hours as needed for wheezing or shortness of breath.   AMLODIPINE (NORVASC) 10 MG TABLET    Take 10 mg by mouth daily.   ASPIRIN EC 81 MG TABLET    Take 81 mg by mouth at bedtime.    DIPHENHYDRAMINE (BENADRYL) 25 MG TABLET    Take 25 mg by mouth 2 (two) times daily as needed for itching.   FERROUS SULFATE 325 (65 FE) MG TABLET    Take 325 mg by mouth daily with breakfast.   MONTELUKAST (SINGULAIR) 10 MG TABLET    Take 10 mg by mouth at bedtime.   MULTIPLE VITAMIN (MULTIVITAMIN WITH MINERALS) TABS    Take 1 tablet by mouth daily. [employer]J's brand multivitamin   MULTIPLE VITAMINS-MINERALS (OCUVITE ADULT 50+ PO)    Take 1 tablet by mouth daily.    NITROGLYCERIN (NITROSTAT) 0.4 MG SL TABLET    Place 1 tablet (0.4 mg total) under the tongue every 5 (five) minutes as needed for chest  pain.   OMEGA-3 FATTY ACIDS (FISH OIL) 1200 MG CAPS    Take 1,200 mg by mouth daily.    OVER THE COUNTER MEDICATION    Place 1 drop into both eyes daily as needed (itching). Over the counter allergy eye drops   POLYETHYLENE GLYCOL (MIRALAX / GLYCOLAX) PACKET    Take 17 g by mouth daily as needed (constipation).    PREDNISOLONE ACETATE (PRED FORTE) 1 % OPHTHALMIC SUSPENSION    Place 1 drop into the left eye 2 (two) times daily.    PYRIDOXINE (VITAMIN B-6) 50 MG TABLET    Take 50 mg by mouth daily.   SENNOSIDES-DOCUSATE SODIUM (SENOKOT-S) 8.6-50 MG TABLET    Take 1 tablet by mouth daily as needed for constipation.    TOLTERODINE (DETROL LA) 4 MG 24 HR CAPSULE    Take 4 mg by mouth daily.  Modified Medications   Modified Medication Previous Medication   CLOPIDOGREL (PLAVIX) 75 MG TABLET clopidogrel (PLAVIX) 75 MG tablet      Take 1  tablet (75 mg total) by mouth daily.    TAKE 1 TABLET EVERY DAY   PANTOPRAZOLE (PROTONIX) 40 MG TABLET pantoprazole (PROTONIX) 40 MG tablet      Take 1 tablet (40 mg total) by mouth daily.    TAKE 1 TABLET EVERY DAY   SIMVASTATIN (ZOCOR) 10 MG TABLET simvastatin (ZOCOR) 10 MG tablet      Take 1 tablet (10 mg total) by mouth at bedtime.    TAKE 1 TABLET AT BEDTIME  Discontinued Medications   ATROPINE 1 % OPHTHALMIC SOLUTION    Place 1 drop into the left eye 2 (two) times daily.    BRIMONIDINE (ALPHAGAN) 0.2 % OPHTHALMIC SOLUTION    Place 1 drop into the left eye 2 (two) times daily.    CLOPIDOGREL (PLAVIX) 75 MG TABLET    Take 1 tablet (75 mg total) by mouth daily.   PANTOPRAZOLE (PROTONIX) 40 MG TABLET    Take 40 mg by mouth daily.    PROTEIN (PROCEL 100) POWD    Take 1 scoop by mouth 2 (two) times daily. For nutritional support and to increase protein   PROVENTIL HFA 108 (90 BASE) MCG/ACT INHALER    Inhale 1-2 puffs into the lungs every 6 (six) hours as needed for wheezing or shortness of breath.    PYRIDOXINE HCL (VITAMIN B6 PO)    Take 100 mg by mouth daily.    SIMVASTATIN (ZOCOR) 10 MG TABLET    Take 1 tablet (10 mg total) by mouth at bedtime.   VALACYCLOVIR (VALTREX) 1000 MG TABLET    Take 1 g by mouth daily.     Allergies  Allergen Reactions  . Ciprofloxacin Rash  . Tape Other (See Comments)    Pulls skin off.  Please use "paper tape" only.   . Dapsone Other (See Comments)    Lowers WBC count?? "made me very sick"     REVIEW OF SYSTEMS:  GENERAL:  no fever, chills  EYES: Denies change in vision, dry eyes, eye pain, itching or discharge EARS: Denies change in hearing, ringing in ears, or earache NOSE: Denies nasal congestion or epistaxis MOUTH and THROAT: Denies oral discomfort, gingival pain or bleeding, pain from teeth or hoarseness   RESPIRATORY: no cough, SOB, DOE, wheezing, hemoptysis CARDIAC: no chest pain, edema or palpitations GI: no abdominal pain, diarrhea,  constipation, heart burn, nausea or vomiting GU: Denies dysuria, frequency, hematuria, incontinence, or discharge PSYCHIATRIC: Denies feeling of  depression or anxiety. No report of hallucinations, insomnia, paranoia, or agitation   PHYSICAL EXAMINATION  GENERAL APPEARANCE: no acute distress. Normal body habitus HEAD: Normal in size and contour. No evidence of trauma EYES: Lids open and close normally. No blepharitis, entropion or ectropion. PERRL. Conjunctivae are clear and sclerae are white. Lenses are without opacity EARS: Pinnae are normal. Patient hears normal voice tunes of the examiner MOUTH and THROAT: Lips are without lesions. Oral mucosa is moist and without lesions. Tongue is normal in shape, size, and color and without lesions NECK: supple, trachea midline, no neck masses, no thyroid tenderness, no thyromegaly LYMPHATICS: no LAN in the neck, no supraclavicular LAN RESPIRATORY: breathing is even & unlabored, BS CTAB CARDIAC: RRR, no murmur,no extra heart sounds, no edema GI: abdomen soft, normal BS, no masses, no tenderness, no hepatomegaly, no splenomegaly EXTREMITIES:  Able to move 4 extremities PSYCHIATRIC: Alert and oriented X 3. Affect and behavior are appropriate  LABS/RADIOLOGY: Labs reviewed: 02/06/15  WBC 7.5 hemoglobin 8.2 hematocrit and 4.4 MCV 91.7 platelet count 457 02/03/15  WBC 8.5 hemoglobin 8.0 hematocrit 24.3 MCV 92.0 platelet count 516 01/31/15  WBC 7.9 hemoglobin 7.7 hematocrit 23.2 MCV 92.1 platelet count 476 sodium 137 potassium 4.4 glucose 111 BUN 14 creatinine 0.93 total bilirubin 0.3 alkaline phosphatase 83 SGOT 25 SGPT 28 total protein 5.3 albumin 2.4 calcium 7.9 Basic Metabolic Panel:  Recent Labs  01/19/15 0322 01/20/15 0414 01/21/15 0336 01/27/15  NA 139 140 137 134*  K 3.4* 3.7 3.4* 3.8  CL 117* 117* 113*  --   CO2 16* 16* 16*  --   GLUCOSE 131* 104* 93  --   BUN 26* 26* 19 16  CREATININE 0.92 0.97 0.91 0.9  CALCIUM 7.7* 7.8* 7.7*  --     Liver Function Tests:  Recent Labs  01/15/15 1528  AST 31  ALT 19  ALKPHOS 58  BILITOT 0.6  PROT 6.3*  ALBUMIN 2.8*   CBC:  Recent Labs  04/13/14 1023  01/19/15 0322 01/20/15 0414 01/21/15 0336 01/27/15  WBC 15.3*  < > 10.9* 10.6* 11.9* 9.8  NEUTROABS 9.9*  --   --   --   --   --   HGB 10.8*  < > 8.4* 8.5* 8.5* 7.4*  HCT 32.2*  < > 24.6* 25.3* 25.0* 21*  MCV 92.0  < > 89.8 92.3 91.2  --   PLT 253  < > 174 191 214 322  < > = values in this interval not displayed.  Cardiac Enzymes:  Recent Labs  04/13/14 1414 04/13/14 1935 04/14/14 0335  TROPONINI <0.30 <0.30 <0.30   CBG:  Recent Labs  01/17/15 1057  GLUCAP 166*      No results found.  ASSESSMENT/PLAN:   Generalized weakness - for home health PT, OT and CNA   GI Bleed - S/P transfusion of 2 units FFP; Coumadin was discontinued; continue Protonix 40 mg 1 tab PO Q D  Anemia, acute blood loss - hgb 8.2; S/P transfusion of 4 units PRBC; continue FeSO4 325 mg 1 tab PO TID  Chronic renal failure, stage III - creatinine 0.93; improved  PE - diagnosed 10/15; Coumadin on hold  Leukocytosis - wbc 7.5;resolved  OSA - CPAP @ night  Hx of shingles - continue Valtrex 1,000 mg daily  Constipation - continue Senna-S 1 tab PO Q D and Miralax 17 gm PO Q HS  Protein-calorie malnutrition, severe - albumin 2.8; continue supplementation  Hypertension - continue Norvasc 10 mg  1 tab PO Q D  CAD - stable; continue ASA 81 mg 1 tab PO Q D, Plavix 75 mg PO Q D and NTG PRN  Allergic rhinitis - continue Singulair 10 mg 1 tab PO Q D  Hyperlipidemia - continue Zocor 10 mg 1 tab PO Q D  Urinary Incontinence - continue Detrol LA 4 mg 24 HR 1 capsule PO Q D      I have filled out patient's discharge paperwork and written prescriptions.  Patient will receive home health PT, OT and CNA.  Total discharge time: Less than 30 minutes  Discharge time involved coordination of the discharge process with Education officer, museum,  nursing staff and therapy department. Medical justification for home health services verified.   [facility], NP [employer] [phone]

## 2015-05-20 DIAGNOSIS — N5234 Erectile dysfunction following simple prostatectomy: Secondary | ICD-10-CM | POA: Diagnosis not present

## 2015-05-20 DIAGNOSIS — N393 Stress incontinence (female) (male): Secondary | ICD-10-CM | POA: Diagnosis not present

## 2015-05-21 DIAGNOSIS — X32XXXD Exposure to sunlight, subsequent encounter: Secondary | ICD-10-CM | POA: Diagnosis not present

## 2015-05-21 DIAGNOSIS — Z1283 Encounter for screening for malignant neoplasm of skin: Secondary | ICD-10-CM | POA: Diagnosis not present

## 2015-05-21 DIAGNOSIS — L309 Dermatitis, unspecified: Secondary | ICD-10-CM | POA: Diagnosis not present

## 2015-05-23 DIAGNOSIS — G4733 Obstructive sleep apnea (adult) (pediatric): Secondary | ICD-10-CM | POA: Diagnosis not present

## 2015-05-28 DIAGNOSIS — K921 Melena: Secondary | ICD-10-CM | POA: Diagnosis not present

## 2015-06-14 DIAGNOSIS — T1490 Injury, unspecified: Secondary | ICD-10-CM | POA: Diagnosis not present

## 2015-06-14 DIAGNOSIS — S3991XA Unspecified injury of abdomen, initial encounter: Secondary | ICD-10-CM | POA: Diagnosis not present

## 2015-06-14 DIAGNOSIS — S199XXA Unspecified injury of neck, initial encounter: Secondary | ICD-10-CM | POA: Diagnosis not present

## 2015-06-14 DIAGNOSIS — S20219A Contusion of unspecified front wall of thorax, initial encounter: Secondary | ICD-10-CM | POA: Diagnosis not present

## 2015-06-14 DIAGNOSIS — Z881 Allergy status to other antibiotic agents status: Secondary | ICD-10-CM | POA: Diagnosis not present

## 2015-06-14 DIAGNOSIS — S0990XA Unspecified injury of head, initial encounter: Secondary | ICD-10-CM | POA: Diagnosis not present

## 2015-06-14 DIAGNOSIS — S8991XA Unspecified injury of right lower leg, initial encounter: Secondary | ICD-10-CM | POA: Diagnosis not present

## 2015-06-14 DIAGNOSIS — I10 Essential (primary) hypertension: Secondary | ICD-10-CM | POA: Diagnosis not present

## 2015-06-14 DIAGNOSIS — M545 Low back pain: Secondary | ICD-10-CM | POA: Diagnosis not present

## 2015-06-14 DIAGNOSIS — R109 Unspecified abdominal pain: Secondary | ICD-10-CM | POA: Diagnosis not present

## 2015-06-14 DIAGNOSIS — M542 Cervicalgia: Secondary | ICD-10-CM | POA: Diagnosis not present

## 2015-06-14 DIAGNOSIS — S299XXA Unspecified injury of thorax, initial encounter: Secondary | ICD-10-CM | POA: Diagnosis not present

## 2015-06-14 DIAGNOSIS — S40011A Contusion of right shoulder, initial encounter: Secondary | ICD-10-CM | POA: Diagnosis not present

## 2015-06-14 DIAGNOSIS — T148 Other injury of unspecified body region: Secondary | ICD-10-CM | POA: Diagnosis not present

## 2015-06-14 DIAGNOSIS — Z87891 Personal history of nicotine dependence: Secondary | ICD-10-CM | POA: Diagnosis not present

## 2015-06-19 DIAGNOSIS — S3992XD Unspecified injury of lower back, subsequent encounter: Secondary | ICD-10-CM | POA: Diagnosis not present

## 2015-07-16 DIAGNOSIS — D649 Anemia, unspecified: Secondary | ICD-10-CM | POA: Diagnosis not present

## 2015-07-16 DIAGNOSIS — R195 Other fecal abnormalities: Secondary | ICD-10-CM | POA: Diagnosis not present

## 2015-07-28 DIAGNOSIS — M25552 Pain in left hip: Secondary | ICD-10-CM | POA: Diagnosis not present

## 2015-07-30 DIAGNOSIS — L309 Dermatitis, unspecified: Secondary | ICD-10-CM | POA: Diagnosis not present

## 2015-08-12 DIAGNOSIS — M25552 Pain in left hip: Secondary | ICD-10-CM | POA: Diagnosis not present

## 2015-08-20 DIAGNOSIS — L820 Inflamed seborrheic keratosis: Secondary | ICD-10-CM | POA: Diagnosis not present

## 2015-08-20 DIAGNOSIS — L309 Dermatitis, unspecified: Secondary | ICD-10-CM | POA: Diagnosis not present

## 2015-08-20 DIAGNOSIS — R05 Cough: Secondary | ICD-10-CM | POA: Diagnosis not present

## 2015-08-20 DIAGNOSIS — J209 Acute bronchitis, unspecified: Secondary | ICD-10-CM | POA: Diagnosis not present

## 2015-08-21 DIAGNOSIS — N528 Other male erectile dysfunction: Secondary | ICD-10-CM | POA: Diagnosis not present

## 2015-08-21 DIAGNOSIS — N393 Stress incontinence (female) (male): Secondary | ICD-10-CM | POA: Diagnosis not present

## 2015-09-12 DIAGNOSIS — J069 Acute upper respiratory infection, unspecified: Secondary | ICD-10-CM | POA: Diagnosis not present

## 2015-09-25 ENCOUNTER — Telehealth: Payer: Self-pay | Admitting: Cardiovascular Disease

## 2015-09-25 MED ORDER — NITROGLYCERIN 0.4 MG SL SUBL: 0.4000 mg | SUBLINGUAL_TABLET | SUBLINGUAL | Status: AC | PRN

## 2015-09-25 NOTE — Telephone Encounter (Signed)
LEFT MESSAGE MEDICATION  FILLED ON VOICE MAIL WAS E-SENT REFILLED X 3

## 2015-09-25 NOTE — Telephone Encounter (Signed)
New message      *STAT* If patient is at the pharmacy, call can be transferred to refill team.   1. Which medications need to be refilled? (please list name of each medication and dose if known) nitro 2. Which pharmacy/location (including street and city if local pharmacy) is medication to be sent to? [email or web address]  3. Do they need a 30 day or 90 day supply? 30 day

## 2015-10-01 DIAGNOSIS — Z961 Presence of intraocular lens: Secondary | ICD-10-CM | POA: Diagnosis not present

## 2015-10-01 DIAGNOSIS — H353120 Nonexudative age-related macular degeneration, left eye, stage unspecified: Secondary | ICD-10-CM | POA: Diagnosis not present

## 2015-10-01 DIAGNOSIS — H353110 Nonexudative age-related macular degeneration, right eye, stage unspecified: Secondary | ICD-10-CM | POA: Diagnosis not present

## 2015-10-01 DIAGNOSIS — H35032 Hypertensive retinopathy, left eye: Secondary | ICD-10-CM | POA: Diagnosis not present

## 2015-10-07 DIAGNOSIS — R32 Unspecified urinary incontinence: Secondary | ICD-10-CM | POA: Diagnosis not present

## 2015-10-07 DIAGNOSIS — N5234 Erectile dysfunction following simple prostatectomy: Secondary | ICD-10-CM | POA: Diagnosis not present

## 2015-10-07 DIAGNOSIS — N3642 Intrinsic sphincter deficiency (ISD): Secondary | ICD-10-CM | POA: Diagnosis not present

## 2015-10-08 IMAGING — CT CT HEAD W/O CM
1 series · 16 of 30 positions shown, 20 images · non-contrast
Comparison: MRI dated 08/05/2005

CLINICAL DATA: 89-year-old male status post fall with increasing
confusion.

EXAM:
CT HEAD WITHOUT CONTRAST
TECHNIQUE: Contiguous axial images were obtained from the base of the skull
through the vertex without intravenous contrast.

[Series 2: head 5.0 h30s · axial · 0.47mm/px · z∈[+1506,+1661]mm · 16 of 35 slices shown, 20 images]
[im 2/35  brain]
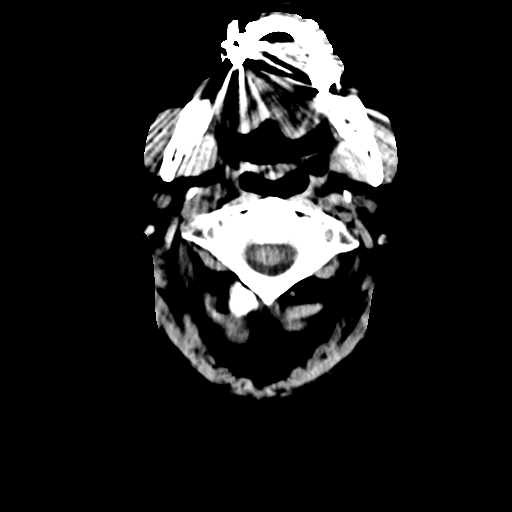
[im 2/35  bone]
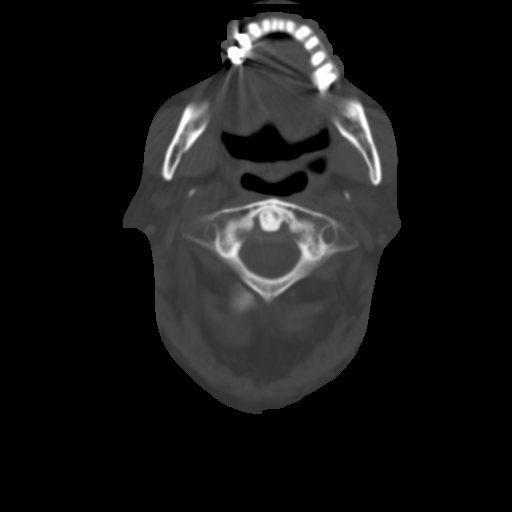
[im 4/35  brain]
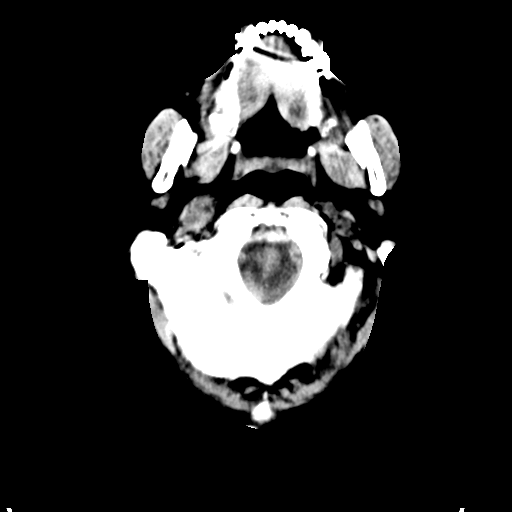
[im 6/35  brain]
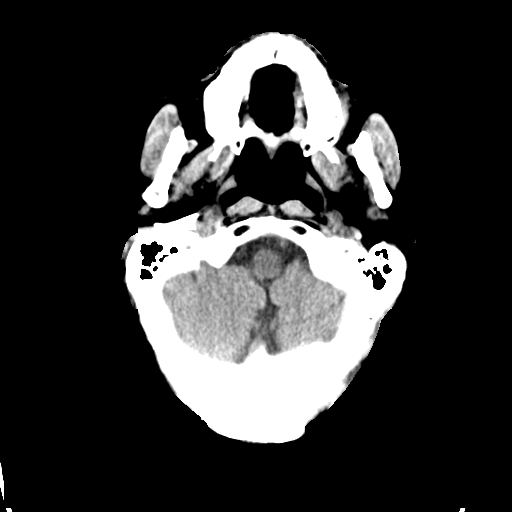
[im 9/35  brain]
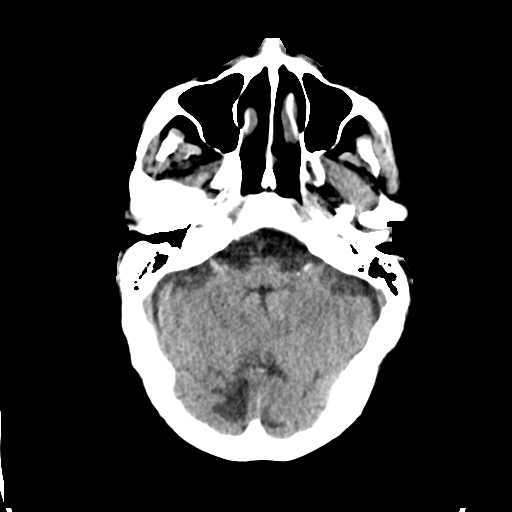
[im 10/35  brain]
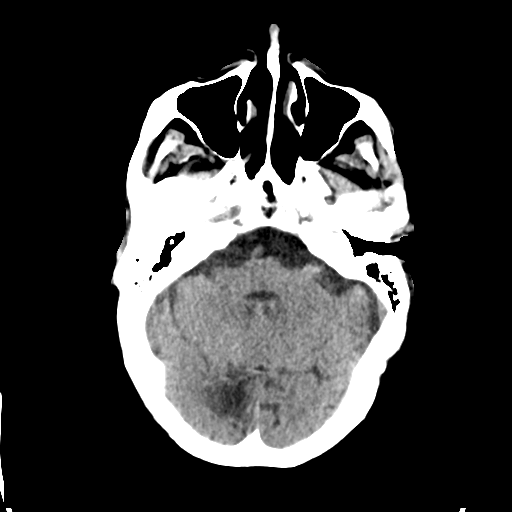
[im 10/35  bone]
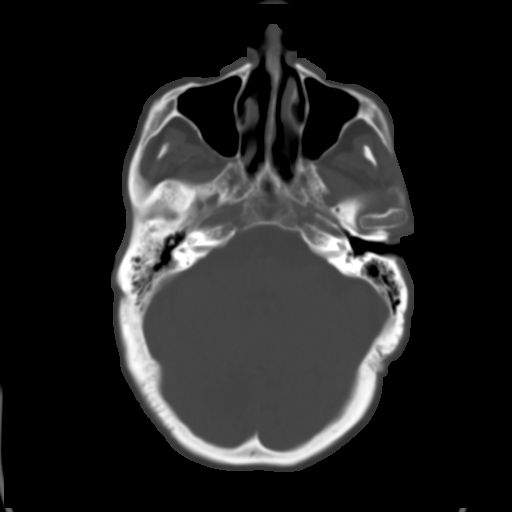
[im 12/35  brain]
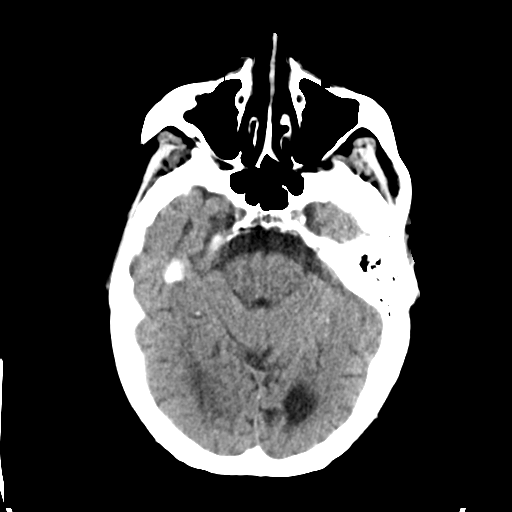
[im 15/35  brain]
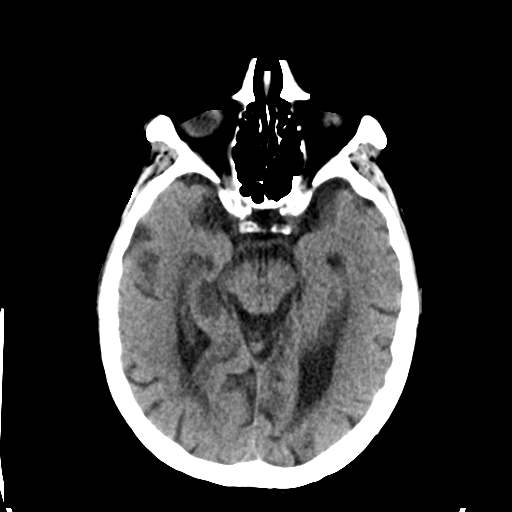
[im 17/35  brain]
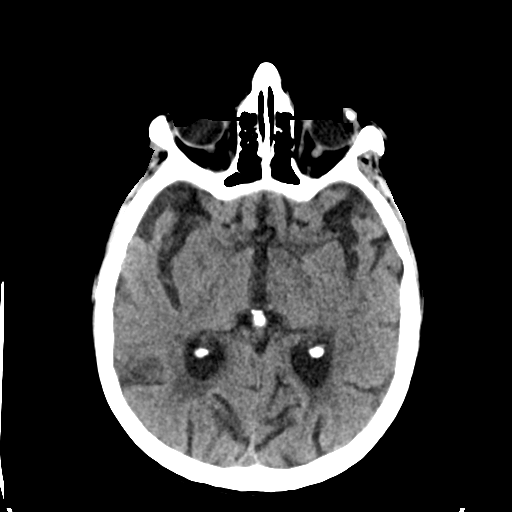
[im 18/35  brain]
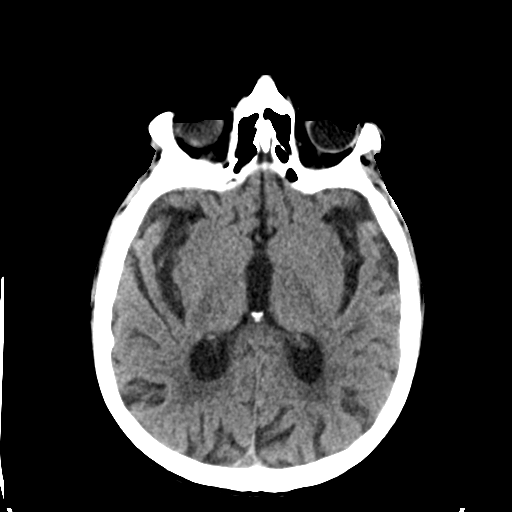
[im 18/35  bone]
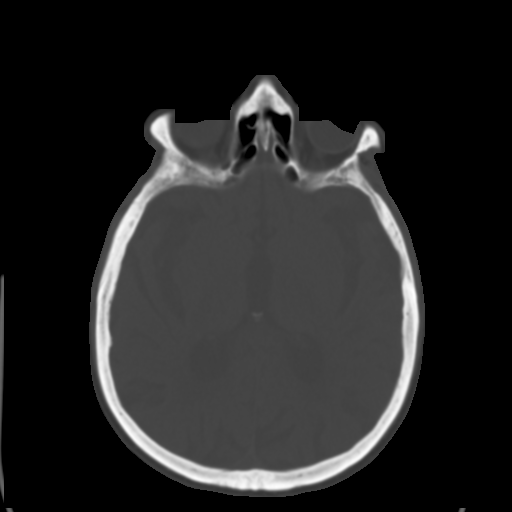
[im 20/35  brain]
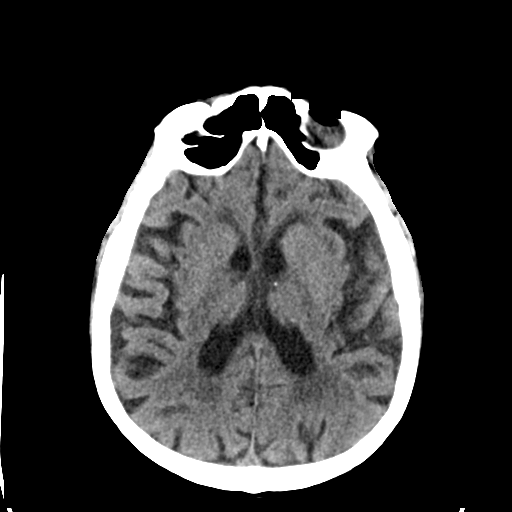
[im 23/35  brain]
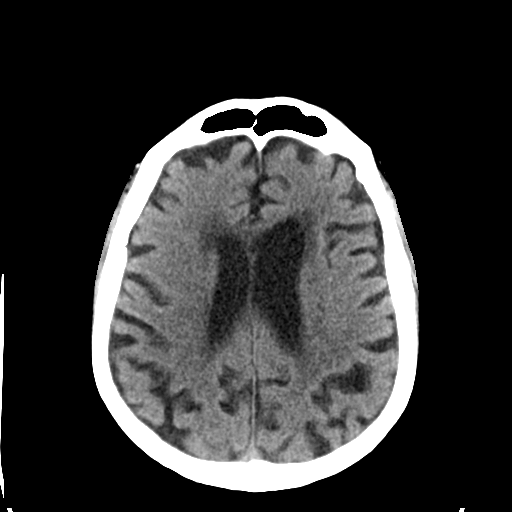
[im 25/35  brain]
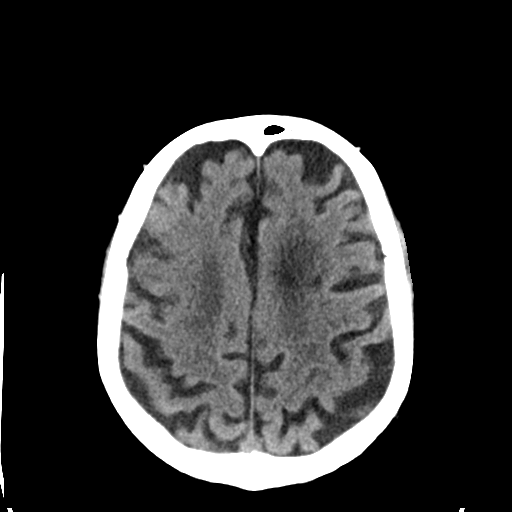
[im 26/35  brain]
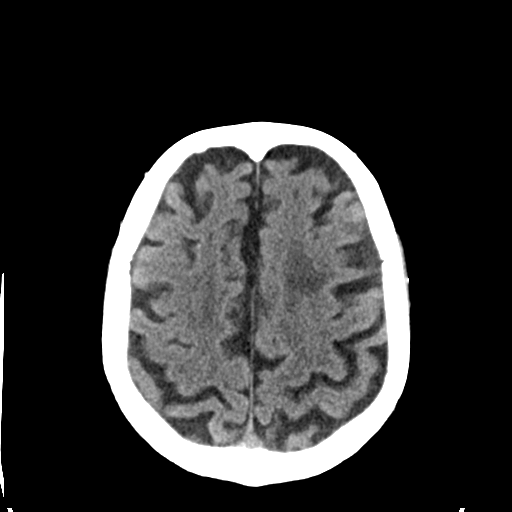
[im 26/35  bone]
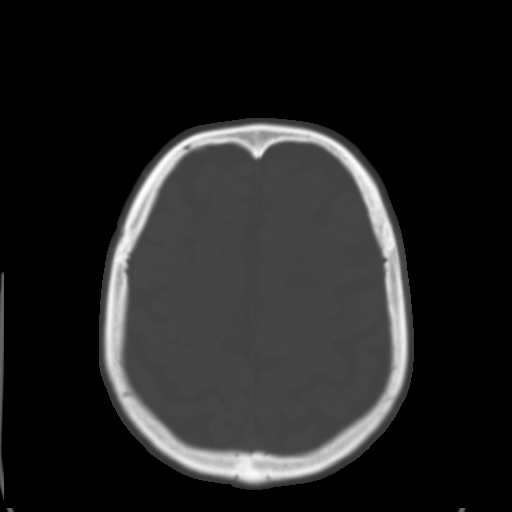
[im 29/35  brain]
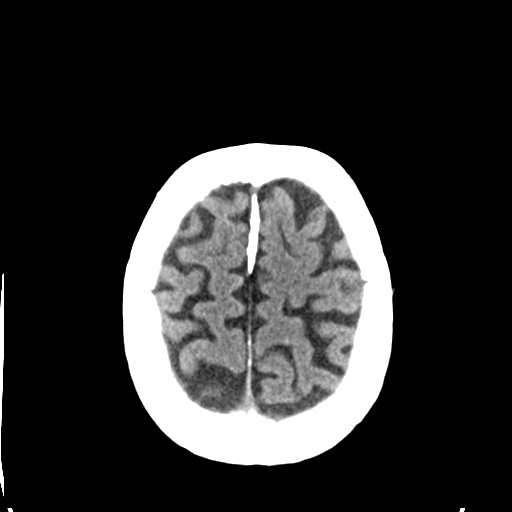
[im 31/35  brain]
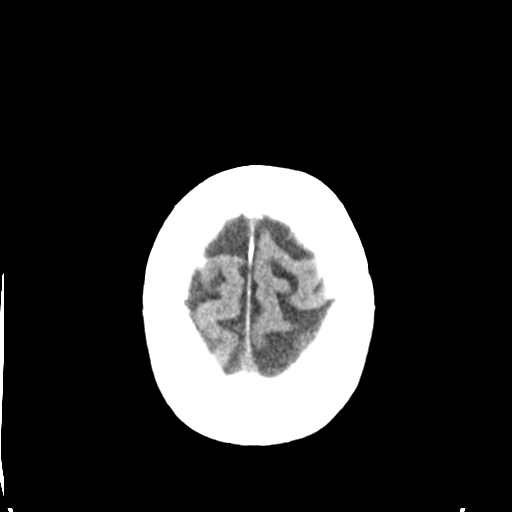
[im 33/35  brain]
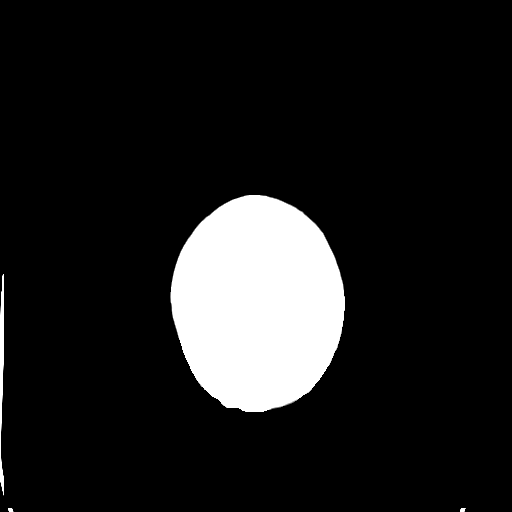

[16 of 30 positions shown; findings below may reference images not displayed]

FINDINGS: The ventricles are dilated and the sulci are prominent compatible
with age-related atrophy. Periventricular and deep white matter
hypodensities represent chronic microvascular ischemic changes.
Focal hypodensity in the right occipital lobe medially represent
chronic ischemia. There is no intracranial hemorrhage. No mass
effect or midline shift identified.

The visualized paranasal sinuses and mastoid air cells are well
aerated. The calvarium is intact.
IMPRESSION: No acute intracranial pathology.

Age-related atrophy and chronic microvascular ischemic disease.

## 2015-10-29 ENCOUNTER — Ambulatory Visit: Payer: Commercial Managed Care - HMO | Admitting: Cardiovascular Disease

## 2015-11-03 DIAGNOSIS — L308 Other specified dermatitis: Secondary | ICD-10-CM | POA: Diagnosis not present

## 2015-11-03 DIAGNOSIS — L820 Inflamed seborrheic keratosis: Secondary | ICD-10-CM | POA: Diagnosis not present

## 2015-11-05 ENCOUNTER — Ambulatory Visit: Payer: Commercial Managed Care - HMO | Admitting: Cardiovascular Disease

## 2015-11-11 ENCOUNTER — Emergency Department (HOSPITAL_COMMUNITY)
Admission: EM | Admit: 2015-11-11 | Discharge: 2015-11-12 | Disposition: A | Discharge status: Home / Self Care | Payer: Commercial Managed Care - HMO | Attending: Emergency Medicine | Admitting: Emergency Medicine

## 2015-11-11 ENCOUNTER — Emergency Department (HOSPITAL_COMMUNITY): Payer: Commercial Managed Care - HMO

## 2015-11-11 ENCOUNTER — Encounter (HOSPITAL_COMMUNITY): Payer: Self-pay | Admitting: Emergency Medicine

## 2015-11-11 DIAGNOSIS — Z7951 Long term (current) use of inhaled steroids: Secondary | ICD-10-CM | POA: Insufficient documentation

## 2015-11-11 DIAGNOSIS — W01198A Fall on same level from slipping, tripping and stumbling with subsequent striking against other object, initial encounter: Secondary | ICD-10-CM | POA: Insufficient documentation

## 2015-11-11 DIAGNOSIS — Y9389 Activity, other specified: Secondary | ICD-10-CM | POA: Diagnosis not present

## 2015-11-11 DIAGNOSIS — I2510 Atherosclerotic heart disease of native coronary artery without angina pectoris: Secondary | ICD-10-CM | POA: Diagnosis not present

## 2015-11-11 DIAGNOSIS — Z9861 Coronary angioplasty status: Secondary | ICD-10-CM | POA: Diagnosis not present

## 2015-11-11 DIAGNOSIS — S5012XA Contusion of left forearm, initial encounter: Secondary | ICD-10-CM | POA: Diagnosis not present

## 2015-11-11 DIAGNOSIS — Z9889 Other specified postprocedural states: Secondary | ICD-10-CM | POA: Diagnosis not present

## 2015-11-11 DIAGNOSIS — Y990 Civilian activity done for income or pay: Secondary | ICD-10-CM | POA: Diagnosis not present

## 2015-11-11 DIAGNOSIS — Y92007 Garden or yard of unspecified non-institutional (private) residence as the place of occurrence of the external cause: Secondary | ICD-10-CM | POA: Diagnosis not present

## 2015-11-11 DIAGNOSIS — S0990XA Unspecified injury of head, initial encounter: Secondary | ICD-10-CM | POA: Insufficient documentation

## 2015-11-11 DIAGNOSIS — I10 Essential (primary) hypertension: Secondary | ICD-10-CM | POA: Diagnosis not present

## 2015-11-11 DIAGNOSIS — M1990 Unspecified osteoarthritis, unspecified site: Secondary | ICD-10-CM | POA: Diagnosis not present

## 2015-11-11 DIAGNOSIS — R51 Headache: Secondary | ICD-10-CM | POA: Diagnosis not present

## 2015-11-11 DIAGNOSIS — S299XXA Unspecified injury of thorax, initial encounter: Secondary | ICD-10-CM | POA: Diagnosis not present

## 2015-11-11 DIAGNOSIS — S20212A Contusion of left front wall of thorax, initial encounter: Secondary | ICD-10-CM | POA: Diagnosis not present

## 2015-11-11 DIAGNOSIS — K5900 Constipation, unspecified: Secondary | ICD-10-CM | POA: Diagnosis not present

## 2015-11-11 DIAGNOSIS — Z9981 Dependence on supplemental oxygen: Secondary | ICD-10-CM | POA: Insufficient documentation

## 2015-11-11 DIAGNOSIS — S6992XA Unspecified injury of left wrist, hand and finger(s), initial encounter: Secondary | ICD-10-CM | POA: Diagnosis not present

## 2015-11-11 DIAGNOSIS — Y93H2 Activity, gardening and landscaping: Secondary | ICD-10-CM | POA: Diagnosis not present

## 2015-11-11 DIAGNOSIS — W19XXXA Unspecified fall, initial encounter: Secondary | ICD-10-CM

## 2015-11-11 DIAGNOSIS — K219 Gastro-esophageal reflux disease without esophagitis: Secondary | ICD-10-CM | POA: Insufficient documentation

## 2015-11-11 DIAGNOSIS — S79912A Unspecified injury of left hip, initial encounter: Secondary | ICD-10-CM | POA: Diagnosis not present

## 2015-11-11 DIAGNOSIS — Z87891 Personal history of nicotine dependence: Secondary | ICD-10-CM | POA: Insufficient documentation

## 2015-11-11 DIAGNOSIS — M25552 Pain in left hip: Secondary | ICD-10-CM | POA: Diagnosis not present

## 2015-11-11 DIAGNOSIS — E7800 Pure hypercholesterolemia, unspecified: Secondary | ICD-10-CM | POA: Diagnosis not present

## 2015-11-11 DIAGNOSIS — Z7982 Long term (current) use of aspirin: Secondary | ICD-10-CM | POA: Diagnosis not present

## 2015-11-11 DIAGNOSIS — R0781 Pleurodynia: Secondary | ICD-10-CM | POA: Diagnosis not present

## 2015-11-11 DIAGNOSIS — Z79899 Other long term (current) drug therapy: Secondary | ICD-10-CM | POA: Diagnosis not present

## 2015-11-11 DIAGNOSIS — Z7902 Long term (current) use of antithrombotics/antiplatelets: Secondary | ICD-10-CM | POA: Diagnosis not present

## 2015-11-11 DIAGNOSIS — M25522 Pain in left elbow: Secondary | ICD-10-CM | POA: Diagnosis not present

## 2015-11-11 DIAGNOSIS — S7002XA Contusion of left hip, initial encounter: Secondary | ICD-10-CM | POA: Diagnosis not present

## 2015-11-11 DIAGNOSIS — M79632 Pain in left forearm: Secondary | ICD-10-CM | POA: Diagnosis not present

## 2015-11-11 DIAGNOSIS — M25532 Pain in left wrist: Secondary | ICD-10-CM | POA: Diagnosis not present

## 2015-11-11 DIAGNOSIS — Z85828 Personal history of other malignant neoplasm of skin: Secondary | ICD-10-CM | POA: Insufficient documentation

## 2015-11-11 DIAGNOSIS — S0081XA Abrasion of other part of head, initial encounter: Secondary | ICD-10-CM | POA: Insufficient documentation

## 2015-11-11 DIAGNOSIS — S29001A Unspecified injury of muscle and tendon of front wall of thorax, initial encounter: Secondary | ICD-10-CM | POA: Diagnosis present

## 2015-11-11 DIAGNOSIS — G4730 Sleep apnea, unspecified: Secondary | ICD-10-CM | POA: Diagnosis not present

## 2015-11-11 DIAGNOSIS — S60212A Contusion of left wrist, initial encounter: Secondary | ICD-10-CM | POA: Insufficient documentation

## 2015-11-11 DIAGNOSIS — M79642 Pain in left hand: Secondary | ICD-10-CM | POA: Diagnosis not present

## 2015-11-11 HISTORY — DX: Atherosclerotic heart disease of native coronary artery without angina pectoris: I25.10

## 2015-11-11 NOTE — ED Provider Notes (Signed)
CSN: [id number]     Arrival date & time 11/11/15  2144 History  By signing my name below, I, Larry Huffman, attest that this documentation has been prepared under the direction and in the presence of Larry Speak, MD. Electronically Signed: Eustaquio Huffman, ED Scribe. 11/11/2015. 11:42 PM.   Chief Complaint  Patient presents with  . Fall   Patient is a 82 y.o. male presenting with fall. The history is provided by the patient and a relative. No language interpreter was used.  Fall This is a new problem. The current episode started yesterday. The problem occurs rarely. The problem has not changed since onset.Associated symptoms include headaches. Pertinent negatives include no chest pain, no abdominal pain and no shortness of breath. Nothing aggravates the symptoms. Nothing relieves the symptoms. He has tried nothing for the symptoms. The treatment provided no relief.    HPI Comments: Larry Huffman is a 82 y.o. male who presents to the Emergency Department complaining of gradual onset, constant, left shoulder pain, left hand pain, left rib pain, and left hip pain s/p ground level fall that occurred yesterday. Son reports that pt was working in the yard when he tripped on an Dealer cord and landed onto his left side. Pt reports that his face hit the grass but denies LOC. He was seen at his PCP's office and had a splint placed onto his left wrist. He was sent to the ED to have xrays done. He also complains of headache.Denies neck pain, shortness of breath, abdominal pain, or any other associated symptoms. Family member is unsure if pt is on anticoagulants. Per med list, pt takes 75 mg Plavix.    Past Medical History  Diagnosis Date  . Hypertension   . Acid reflux   . High cholesterol   . Coronary artery disease   . Arthritis   . Sleep apnea     CPAP  . Peripheral vascular disease ([place])     Remote left carotid endarterectomy  . Cancer (HCC) hx of skin cancer  . HOH (hard of hearing)      wears hearing aids, but still can not hear  . Incontinence of urine   . Constipation   . CAD (coronary artery disease)    Past Surgical History  Procedure Laterality Date  . Rotator cuff repair    . Prostate surgery    . Laparotomy    . Coronary stent placement    . Nasal sinus surgery    . Anal fissure repair    . Eye surgery      cataracts  . Irrigation and debridement abscess    . I&d extremity  06/28/2012    Procedure: IRRIGATION AND DEBRIDEMENT EXTREMITY;  Surgeon: Newt Minion, MD;  Location: [facility];  Service: Orthopedics;  Laterality: Right;  Debridement Wound Right Leg, VAC, Antibiotic Beads, Apply A-Cell  . 2-d echocardiogram  03/07/2008    Ejection fraction greater than 55%. Mild concentric left ventricular hypertrophy.LV relaxation. mildly dilated left atrium. Mild mitral regurgitation.   . Cardiac catheterization      X 2 stents  . Colonoscopy w/ polypectomy    . Colon surgery      removed part of the small intestine  . Lumbar laminectomy N/A 02/08/2014    Procedure: L3-4, L4-5 Decompression;  Surgeon: Marybelle Killings, MD;  Location: [place];  Service: Orthopedics;  Laterality: N/A;  L3-4, L4-5 Decompression  . Esophagogastroduodenoscopy N/A 01/16/2015    Procedure: ESOPHAGOGASTRODUODENOSCOPY (EGD);  Surgeon: Docia Chuck  Henrene Pastor, MD;  Location: [facility];  Service: Endoscopy;  Laterality: N/A;   Family History  Problem Relation Age of Onset  . Hypertension Mother    Social History  Substance Use Topics  . Smoking status: Former Smoker    Types: Cigarettes    Quit date: 07/12/1978  . Smokeless tobacco: Never Used  . Alcohol Use: No    Review of Systems  Respiratory: Negative for shortness of breath.   Cardiovascular: Negative for chest pain.  Gastrointestinal: Negative for abdominal pain.  Neurological: Positive for headaches.  All other systems reviewed and are negative.  Allergies  Ciprofloxacin; Tape; and Dapsone  Home Medications   Prior to Admission  medications   Medication Sig Start Date End Date Taking? Authorizing Provider  acetaminophen (TYLENOL) 500 MG tablet Take 500 mg by mouth every 6 (six) hours as needed (pain).    Historical Provider, MD  albuterol (PROVENTIL HFA;VENTOLIN HFA) 108 (90 BASE) MCG/ACT inhaler Inhale 1 puff into the lungs every 6 (six) hours as needed for wheezing or shortness of breath.    Historical Provider, MD  amLODipine (NORVASC) 10 MG tablet Take 10 mg by mouth daily.    Historical Provider, MD  aspirin EC 81 MG tablet Take 81 mg by mouth at bedtime.     Historical Provider, MD  clopidogrel (PLAVIX) 75 MG tablet Take 1 tablet (75 mg total) by mouth daily. 03/19/15   Lorretta Harp, MD  diphenhydrAMINE (BENADRYL) 25 MG tablet Take 25 mg by mouth 2 (two) times daily as needed for itching.    Historical Provider, MD  ferrous sulfate 325 (65 FE) MG tablet Take 325 mg by mouth daily with breakfast.    Historical Provider, MD  montelukast (SINGULAIR) 10 MG tablet Take 10 mg by mouth at bedtime.    Historical Provider, MD  Multiple Vitamin (MULTIVITAMIN WITH MINERALS) TABS Take 1 tablet by mouth daily. [employer] brand multivitamin    Historical Provider, MD  Multiple Vitamins-Minerals (OCUVITE ADULT 50+ PO) Take 1 tablet by mouth daily.     Historical Provider, MD  nitroGLYCERIN (NITROSTAT) 0.4 MG SL tablet Place 1 tablet (0.4 mg total) under the tongue every 5 (five) minutes as needed for chest pain. 09/25/15   Lorretta Harp, MD  Omega-3 Fatty Acids (FISH OIL) 1200 MG CAPS Take 1,200 mg by mouth daily.     Historical Provider, MD  OVER THE COUNTER MEDICATION Place 1 drop into both eyes daily as needed (itching). Over the counter allergy eye drops    Historical Provider, MD  pantoprazole (PROTONIX) 40 MG tablet Take 1 tablet (40 mg total) by mouth daily. 03/19/15   Lorretta Harp, MD  polyethylene glycol [facility] Floria Raveling) packet Take 17 g by mouth daily as needed (constipation).     Historical Provider, MD  prednisoLONE  acetate (PRED FORTE) 1 % ophthalmic suspension Place 1 drop into the left eye 2 (two) times daily.     Historical Provider, MD  pyridOXINE (VITAMIN B-6) 50 MG tablet Take 50 mg by mouth daily.    Historical Provider, MD  sennosides-docusate sodium (SENOKOT-S) 8.6-50 MG tablet Take 1 tablet by mouth daily as needed for constipation.  12/03/14   Historical Provider, MD  simvastatin (ZOCOR) 10 MG tablet Take 1 tablet (10 mg total) by mouth at bedtime. 03/19/15   Lorretta Harp, MD  tolterodine (DETROL LA) 4 MG 24 hr capsule Take 4 mg by mouth daily. 03/12/13   Historical Provider, MD   BP  186/65 mmHg  Pulse 75  Temp(Src) 98.5 F (36.9 C) (Oral)  Resp 16  SpO2 99%   Physical Exam  Constitutional: He is oriented to person, place, and time. He appears well-developed and well-nourished.  HENT:  Head: Normocephalic.  Superficial abrasions to the left side of the face  Eyes: EOM are normal.  Neck: Normal range of motion.  Cardiovascular: Normal rate, regular rhythm, normal heart sounds and intact distal pulses.   Pulmonary/Chest: Effort normal and breath sounds normal. No respiratory distress. He exhibits tenderness.  TTP over the left lateral lower ribs. No crepitus or palpable abnormality.   Abdominal: Soft. He exhibits no distension. There is no tenderness.  Musculoskeletal: Normal range of motion. He exhibits tenderness.  The left wrist has swelling and ecchymosis extending into the dorsum of the hand. Distal PMS intact.   Mild TTP over the left lateral hip. No leg shortening or rotation.   Neurological: He is alert and oriented to person, place, and time.  Skin: Skin is warm and dry.  Psychiatric: He has a normal mood and affect. Judgment normal.  Nursing note and vitals reviewed.   ED Course  Procedures (including critical care time)  DIAGNOSTIC STUDIES: Oxygen Saturation is 99% on RA, normal by my interpretation.    COORDINATION OF CARE: 11:41 PM-Discussed treatment plan with pt  at bedside and pt agreed to plan.   Labs Review Labs Reviewed - No data to display  Imaging Review No results found. I have personally reviewed and evaluated these images and lab results as part of my medical decision-making.   EKG Interpretation None      MDM   Final diagnoses:  None    All x-rays are negative for fracture. Patient will be discharged with a splint for his left wrist and when necessary return for any problems.   I personally performed the services described in this documentation, which was scribed in my presence. The recorded information has been reviewed and is accurate.       Larry Speak, MD 11/12/15 [phone]

## 2015-11-11 NOTE — ED Notes (Signed)
Pt fell at home yesterday and tried to catch himself with his left hand  [facility] and was sent here for xrays  Pt is c/o pain to his left arm, hand, lower ribs, and hip  Pt is ambulatory to triage room  Pt is hard of hearing

## 2015-11-12 ENCOUNTER — Ambulatory Visit (INDEPENDENT_AMBULATORY_CARE_PROVIDER_SITE_OTHER): Payer: Commercial Managed Care - HMO | Admitting: Cardiovascular Disease

## 2015-11-12 ENCOUNTER — Encounter: Payer: Self-pay | Admitting: Cardiovascular Disease

## 2015-11-12 VITALS — BP 164/62 | HR 66 | Ht 67.0 in | Wt 173.0 lb

## 2015-11-12 DIAGNOSIS — I10 Essential (primary) hypertension: Secondary | ICD-10-CM

## 2015-11-12 DIAGNOSIS — I2510 Atherosclerotic heart disease of native coronary artery without angina pectoris: Secondary | ICD-10-CM | POA: Diagnosis not present

## 2015-11-12 DIAGNOSIS — I739 Peripheral vascular disease, unspecified: Secondary | ICD-10-CM | POA: Diagnosis not present

## 2015-11-12 DIAGNOSIS — R0781 Pleurodynia: Secondary | ICD-10-CM | POA: Diagnosis not present

## 2015-11-12 DIAGNOSIS — E785 Hyperlipidemia, unspecified: Secondary | ICD-10-CM | POA: Diagnosis not present

## 2015-11-12 DIAGNOSIS — M79642 Pain in left hand: Secondary | ICD-10-CM | POA: Diagnosis not present

## 2015-11-12 DIAGNOSIS — S79912A Unspecified injury of left hip, initial encounter: Secondary | ICD-10-CM | POA: Diagnosis not present

## 2015-11-12 DIAGNOSIS — M25532 Pain in left wrist: Secondary | ICD-10-CM | POA: Diagnosis not present

## 2015-11-12 DIAGNOSIS — S299XXA Unspecified injury of thorax, initial encounter: Secondary | ICD-10-CM | POA: Diagnosis not present

## 2015-11-12 DIAGNOSIS — S0990XA Unspecified injury of head, initial encounter: Secondary | ICD-10-CM | POA: Diagnosis not present

## 2015-11-12 DIAGNOSIS — I2583 Coronary atherosclerosis due to lipid rich plaque: Secondary | ICD-10-CM

## 2015-11-12 DIAGNOSIS — R51 Headache: Secondary | ICD-10-CM | POA: Diagnosis not present

## 2015-11-12 DIAGNOSIS — M25522 Pain in left elbow: Secondary | ICD-10-CM | POA: Diagnosis not present

## 2015-11-12 DIAGNOSIS — S6992XA Unspecified injury of left wrist, hand and finger(s), initial encounter: Secondary | ICD-10-CM | POA: Diagnosis not present

## 2015-11-12 NOTE — Assessment & Plan Note (Signed)
istory of hyperlipidemia on statin therapy followed by his PCP 

## 2015-11-12 NOTE — Assessment & Plan Note (Signed)
History of CAD status post LAD and circumflex stenting in the past with drug-eluting stents and subsequent negative Myoview stress test 08/17/12. He denies chest pain or shortness of breath.

## 2015-11-12 NOTE — Discharge Instructions (Signed)
Wear wrist splint for comfort for the next several days.  Ice for 20 minutes every 2 hours while awake for the next 2 days.  Return to the emergency department for difficulty breathing, severe chest pain, or other new and concerning symptoms.   Contusion A contusion is a deep bruise. Contusions are the result of a blunt injury to tissues and muscle fibers under the skin. The injury causes bleeding under the skin. The skin overlying the contusion may turn blue, purple, or yellow. Minor injuries will give you a painless contusion, but more severe contusions may stay painful and swollen for a few weeks.  CAUSES  This condition is usually caused by a blow, trauma, or direct force to an area of the body. SYMPTOMS  Symptoms of this condition include:  Swelling of the injured area.  Pain and tenderness in the injured area.  Discoloration. The area may have redness and then turn blue, purple, or yellow. DIAGNOSIS  This condition is diagnosed based on a physical exam and medical history. An X-ray, CT scan, or MRI may be needed to determine if there are any associated injuries, such as broken bones (fractures). TREATMENT  Specific treatment for this condition depends on what area of the body was injured. In general, the best treatment for a contusion is resting, icing, applying pressure to (compression), and elevating the injured area. This is often called the RICE strategy. Over-the-counter anti-inflammatory medicines may also be recommended for pain control.  HOME CARE INSTRUCTIONS   Rest the injured area.  If directed, apply ice to the injured area:  Put ice in a plastic bag.  Place a towel between your skin and the bag.  Leave the ice on for 20 minutes, 2-3 times per day.  If directed, apply light compression to the injured area using an elastic bandage. Make sure the bandage is not wrapped too tightly. Remove and reapply the bandage as directed by your health care provider.  If  possible, raise (elevate) the injured area above the level of your heart while you are sitting or lying down.  Take over-the-counter and prescription medicines only as told by your health care provider. SEEK MEDICAL CARE IF:  Your symptoms do not improve after several days of treatment.  Your symptoms get worse.  You have difficulty moving the injured area. SEEK IMMEDIATE MEDICAL CARE IF:   You have severe pain.  You have numbness in a hand or foot.  Your hand or foot turns pale or cold.   This information is not intended to replace advice given to you by your health care provider. Make sure you discuss any questions you have with your health care provider.   Document Released: 04/07/2005 Document Revised: 03/19/2015 Document Reviewed: 11/13/2014 Elsevier Interactive Patient Education [employer].

## 2015-11-12 NOTE — Assessment & Plan Note (Signed)
History of carotid artery disease status post left carotid endarterectomy in the past His last Doppler study performed 10/02/13 revealed a widely patent endarterectomy site with following mild bilateral ICA stenosis.

## 2015-11-12 NOTE — Assessment & Plan Note (Signed)
History of hypertension blood pressure measured at 164/62. He is on amlodipine. I suspect his blood pressure is elevated because of pain from falling on hisl eft side injuring his wrist and ribs.continue current meds at current dosing

## 2015-11-12 NOTE — ED Notes (Signed)
Pt ambulated to restroom. 

## 2015-11-12 NOTE — Progress Notes (Signed)
11/12/2015 Larry Huffman   29-Jul-1923  [id number]  Primary Physician Michel Harrow, PA-C Primary Cardiologist: Lorretta Harp MD [name]   HPI:  The patient returns today for followup. He is an 82 year old mildly overweight, married Caucasian male, father of 2, grandfather to 8 grandchildren who I saw in the office /7/16. He is accompanied by his eldest son Larry Huffman today. He has a history of CAD status post LAD and circumflex stenting in the past with drug-eluting stents and subsequent negative Myoview as recently as August 17, 2012. He has had left carotid endarterectomy remotely as well which we follow by duplex ultrasound. This was most recently done this past March of last year and was widely patent. He has normal lower extremity arterial Dopplers and venous Dopplers suggesting venous insufficiency. He has obstructive sleep apnea on CPAP followed by Dr. Claiborne Billings. He saw Dr. Mali Hilty who thought he was a good candidate for endovenous ablation. He had admission in October of last year for acute pulmonary embolism and has been on Coumadin at coagulation. He really needs a urologic procedure and had a pharmacologic Myoview stress test performed in January of this year which was low with a nonischemic. Since I saw him back he has fallen and injured his head with excessive bleeding. He also developed shingles. His Coumadin has since been discontinued. He denies chest pain or shortness of breath.he also fell and sprained his left wrist and left sided ribs. He is in some pain today.   Current Outpatient Prescriptions  Medication Sig Dispense Refill  . acetaminophen (TYLENOL) 500 MG tablet Take 500 mg by mouth every 6 (six) hours as needed (pain).    Marland Kitchen albuterol (PROVENTIL HFA;VENTOLIN HFA) 108 (90 BASE) MCG/ACT inhaler Inhale 1 puff into the lungs every 6 (six) hours as needed for wheezing or shortness of breath.    Marland Kitchen amLODipine (NORVASC) 10 MG tablet Take 10 mg by mouth  daily.    Marland Kitchen aspirin EC 81 MG tablet Take 81 mg by mouth at bedtime.     . clobetasol (TEMOVATE) 0.05 % external solution Apply 1 application topically 2 (two) times daily.    . clobetasol cream (TEMOVATE) [id number] % Apply 1 application topically 2 (two) times daily.    . clopidogrel (PLAVIX) 75 MG tablet Take 1 tablet (75 mg total) by mouth daily. 90 tablet 3  . doxepin (SINEQUAN) 25 MG capsule Take 25 mg by mouth 2 (two) times daily.    . ferrous sulfate 325 (65 FE) MG tablet Take 325 mg by mouth daily with breakfast.    . ketoconazole (NIZORAL) 2 % cream Apply 1 application topically daily as needed for irritation.    Marland Kitchen loratadine (CLARITIN) 5 MG chewable tablet Chew 5 mg by mouth daily.    . montelukast (SINGULAIR) 10 MG tablet Take 10 mg by mouth at bedtime.    . Multiple Vitamin (MULTIVITAMIN WITH MINERALS) TABS Take 1 tablet by mouth daily. [employer]'s brand multivitamin    . Multiple Vitamins-Minerals (OCUVITE ADULT 50+ PO) Take 1 tablet by mouth daily.     . nitroGLYCERIN (NITROSTAT) 0.4 MG SL tablet Place 1 tablet (0.4 mg total) under the tongue every 5 (five) minutes as needed for chest pain. 25 tablet 3  . Omega-3 Fatty Acids (FISH OIL) 1200 MG CAPS Take 1,200 mg by mouth daily.     Marland Kitchen OVER THE COUNTER MEDICATION Place 1 drop into both eyes daily as needed (itching). Over the counter  allergy eye drops    . pantoprazole (PROTONIX) 40 MG tablet Take 1 tablet (40 mg total) by mouth daily. 90 tablet 3  . polyethylene glycol (MIRALAX / GLYCOLAX) packet Take 17 g by mouth daily as needed (constipation).     . pyridOXINE (VITAMIN B-6) 50 MG tablet Take 50 mg by mouth daily.    . simvastatin (ZOCOR) 10 MG tablet Take 1 tablet (10 mg total) by mouth at bedtime. 90 tablet 3   No current facility-administered medications for this visit.    Allergies  Allergen Reactions  . Ciprofloxacin Rash  . Tape Other (See Comments)    Pulls skin off.  Please use "paper tape" only.   . Dapsone Other (See  Comments)    Lowers WBC count?? "made me very sick"    Social History   Social History  . Marital Status: Widowed    Spouse Name: N/A  . Number of Children: N/A  . Years of Education: N/A   Occupational History  . Not on file.   Social History Main Topics  . Smoking status: Former Smoker    Types: Cigarettes    Quit date: 07/12/1978  . Smokeless tobacco: Never Used  . Alcohol Use: No  . Drug Use: No  . Sexual Activity: Not on file   Other Topics Concern  . Not on file   Social History Narrative     Review of Systems: General: negative for chills, fever, night sweats or weight changes.  Cardiovascular: negative for chest pain, dyspnea on exertion, edema, orthopnea, palpitations, paroxysmal nocturnal dyspnea or shortness of breath Dermatological: negative for rash Respiratory: negative for cough or wheezing Urologic: negative for hematuria Abdominal: negative for nausea, vomiting, diarrhea, bright red blood per rectum, melena, or hematemesis Neurologic: negative for visual changes, syncope, or dizziness All other systems reviewed and are otherwise negative except as noted above.    Blood pressure 164/62, pulse 66, height 5\' 7"  (1.702 m), weight 173 lb (78.472 kg).  General appearance: alert and no distress Neck: no adenopathy, no carotid bruit, no JVD, supple, symmetrical, trachea midline and thyroid not enlarged, symmetric, no tenderness/mass/nodules Lungs: clear to auscultation bilaterally Heart: regular rate and rhythm, S1, S2 normal, no murmur, click, rub or gallop Extremities: extremities normal, atraumatic, no cyanosis or edema  EKG normal sinus rhythm at 66 with evidence of LVH by voltage criteria. I personally reviewed this EKG  ASSESSMENT AND PLAN:   CAD - CFX DES 7/07, LAD DES 10/08, cath 11/11- medical Rx History of CAD status post LAD and circumflex stenting in the past with drug-eluting stents and subsequent negative Myoview stress test 08/17/12. He  denies chest pain or shortness of breath.  Peripheral arterial disease: History of left carotid endarterectomy History of carotid artery disease status post left carotid endarterectomy in the past His last Doppler study performed 10/02/13 revealed a widely patent endarterectomy site with following mild bilateral ICA stenosis.  Essential hypertension History of hypertension blood pressure measured at 164/62. He is on amlodipine. I suspect his blood pressure is elevated because of pain from falling on hisl eft side injuring his wrist and ribs.continue current meds at current dosing  HLD (hyperlipidemia) istory of hyperlipidemia on statin therapy followed by his PCP      Lorretta Harp MD [facility], [facility] 11/12/2015 1:59 PM

## 2015-11-12 NOTE — Patient Instructions (Signed)
Medication Instructions:  Your physician recommends that you continue on your current medications as directed. Please refer to the Current Medication list given to you today.   Follow-Up: Your physician wants you to follow-up in: [place]. You will receive a reminder letter in the mail two months in advance. If you don't receive a letter, please call our office to schedule the follow-up appointment.   Any Other Special Instructions Will Be Listed Below (If Applicable).     If you need a refill on your cardiac medications before your next appointment, please call your pharmacy.

## 2015-12-31 ENCOUNTER — Emergency Department (HOSPITAL_COMMUNITY): Payer: Commercial Managed Care - HMO

## 2015-12-31 ENCOUNTER — Emergency Department (HOSPITAL_COMMUNITY)
Admission: EM | Admit: 2015-12-31 | Discharge: 2015-12-31 | Disposition: A | Discharge status: Home / Self Care | Payer: Commercial Managed Care - HMO | Attending: Emergency Medicine | Admitting: Emergency Medicine

## 2015-12-31 ENCOUNTER — Encounter (HOSPITAL_COMMUNITY): Payer: Self-pay | Admitting: Emergency Medicine

## 2015-12-31 DIAGNOSIS — S0003XA Contusion of scalp, initial encounter: Secondary | ICD-10-CM | POA: Diagnosis not present

## 2015-12-31 DIAGNOSIS — Y999 Unspecified external cause status: Secondary | ICD-10-CM | POA: Insufficient documentation

## 2015-12-31 DIAGNOSIS — S0990XA Unspecified injury of head, initial encounter: Secondary | ICD-10-CM | POA: Diagnosis not present

## 2015-12-31 DIAGNOSIS — S51812A Laceration without foreign body of left forearm, initial encounter: Secondary | ICD-10-CM | POA: Insufficient documentation

## 2015-12-31 DIAGNOSIS — S59912A Unspecified injury of left forearm, initial encounter: Secondary | ICD-10-CM | POA: Diagnosis present

## 2015-12-31 DIAGNOSIS — Z7902 Long term (current) use of antithrombotics/antiplatelets: Secondary | ICD-10-CM | POA: Insufficient documentation

## 2015-12-31 DIAGNOSIS — T07XXXA Unspecified multiple injuries, initial encounter: Secondary | ICD-10-CM

## 2015-12-31 DIAGNOSIS — W228XXA Striking against or struck by other objects, initial encounter: Secondary | ICD-10-CM | POA: Insufficient documentation

## 2015-12-31 DIAGNOSIS — I10 Essential (primary) hypertension: Secondary | ICD-10-CM | POA: Diagnosis not present

## 2015-12-31 DIAGNOSIS — I2510 Atherosclerotic heart disease of native coronary artery without angina pectoris: Secondary | ICD-10-CM | POA: Insufficient documentation

## 2015-12-31 DIAGNOSIS — Z859 Personal history of malignant neoplasm, unspecified: Secondary | ICD-10-CM | POA: Diagnosis not present

## 2015-12-31 DIAGNOSIS — Y939 Activity, unspecified: Secondary | ICD-10-CM | POA: Insufficient documentation

## 2015-12-31 DIAGNOSIS — S0083XA Contusion of other part of head, initial encounter: Secondary | ICD-10-CM | POA: Insufficient documentation

## 2015-12-31 DIAGNOSIS — T148XXA Other injury of unspecified body region, initial encounter: Secondary | ICD-10-CM

## 2015-12-31 DIAGNOSIS — Y929 Unspecified place or not applicable: Secondary | ICD-10-CM | POA: Diagnosis not present

## 2015-12-31 DIAGNOSIS — Z7982 Long term (current) use of aspirin: Secondary | ICD-10-CM | POA: Insufficient documentation

## 2015-12-31 DIAGNOSIS — S0121XA Laceration without foreign body of nose, initial encounter: Secondary | ICD-10-CM | POA: Diagnosis not present

## 2015-12-31 DIAGNOSIS — Z87891 Personal history of nicotine dependence: Secondary | ICD-10-CM | POA: Diagnosis not present

## 2015-12-31 DIAGNOSIS — W19XXXA Unspecified fall, initial encounter: Secondary | ICD-10-CM

## 2015-12-31 NOTE — ED Provider Notes (Signed)
CSN: [id number]     Arrival date & time 12/31/15  1136 History   First MD Initiated Contact with Patient 12/31/15 1209     Chief Complaint  Patient presents with  . Fall     (Consider location/radiation/quality/duration/timing/severity/associated sxs/prior Treatment) HPI Was bending over to pick something up from his driveway. Patient lost his balance and fell forward striking his left forehead and abrading his arms. Patient did not have loss of consciousness. Son reports his mental status is been the same. Patient denies headache or neck pain. There is been no incoordination. No nausea or vomiting. Past Medical History  Diagnosis Date  . Hypertension   . Acid reflux   . High cholesterol   . Coronary artery disease   . Arthritis   . Sleep apnea     CPAP  . Peripheral vascular disease ([place])     Remote left carotid endarterectomy  . Cancer (HCC) hx of skin cancer  . HOH (hard of hearing)     wears hearing aids, but still can not hear  . Incontinence of urine   . Constipation   . CAD (coronary artery disease)    Past Surgical History  Procedure Laterality Date  . Rotator cuff repair    . Prostate surgery    . Laparotomy    . Coronary stent placement    . Nasal sinus surgery    . Anal fissure repair    . Eye surgery      cataracts  . Irrigation and debridement abscess    . I&d extremity  06/28/2012    Procedure: IRRIGATION AND DEBRIDEMENT EXTREMITY;  Surgeon: Newt Minion, MD;  Location: [place];  Service: Orthopedics;  Laterality: Right;  Debridement Wound Right Leg, VAC, Antibiotic Beads, Apply A-Cell  . 2-d echocardiogram  03/07/2008    Ejection fraction greater than 55%. Mild concentric left ventricular hypertrophy.LV relaxation. mildly dilated left atrium. Mild mitral regurgitation.   . Cardiac catheterization      X 2 stents  . Colonoscopy w/ polypectomy    . Colon surgery      removed part of the small intestine  . Lumbar laminectomy N/A 02/08/2014    Procedure:  L3-4, L4-5 Decompression;  Surgeon: Marybelle Killings, MD;  Location: [place];  Service: Orthopedics;  Laterality: N/A;  L3-4, L4-5 Decompression  . Esophagogastroduodenoscopy N/A 01/16/2015    Procedure: ESOPHAGOGASTRODUODENOSCOPY (EGD);  Surgeon: Irene Shipper, MD;  Location: [facility];  Service: Endoscopy;  Laterality: N/A;   Family History  Problem Relation Age of Onset  . Hypertension Mother    Social History  Substance Use Topics  . Smoking status: Former Smoker    Types: Cigarettes    Quit date: 07/12/1978  . Smokeless tobacco: Never Used  . Alcohol Use: No    Review of Systems 10 Systems reviewed and are negative for acute change except as noted in the HPI.    Allergies  Ciprofloxacin; Tape; and Dapsone  Home Medications   Prior to Admission medications   Medication Sig Start Date End Date Taking? Authorizing Provider  amLODipine (NORVASC) 10 MG tablet Take 10 mg by mouth daily.   Yes Historical Provider, MD  aspirin EC 81 MG tablet Take 81 mg by mouth at bedtime.    Yes Historical Provider, MD  clobetasol (TEMOVATE) 0.05 % external solution Apply 1 application topically 2 (two) times daily. 11/03/15  Yes Historical Provider, MD  clopidogrel (PLAVIX) 75 MG tablet Take 1 tablet (75 mg total) by  mouth daily. 03/19/15  Yes Lorretta Harp, MD  ferrous sulfate 325 (65 FE) MG tablet Take 325 mg by mouth daily with breakfast.   Yes Historical Provider, MD  hydrOXYzine (VISTARIL) 25 MG capsule Take 25 mg by mouth 3 (three) times daily as needed for anxiety or itching.   Yes Historical Provider, MD  ketoconazole (NIZORAL) 2 % cream Apply 1 application topically daily as needed for irritation.   Yes Historical Provider, MD  loratadine (CLARITIN) 5 MG chewable tablet Chew 5 mg by mouth daily.   Yes Historical Provider, MD  montelukast (SINGULAIR) 10 MG tablet Take 10 mg by mouth at bedtime.   Yes Historical Provider, MD  Multiple Vitamin (MULTIVITAMIN WITH MINERALS) TABS Take 1 tablet by  mouth daily. [employer] brand multivitamin   Yes Historical Provider, MD  Multiple Vitamins-Minerals (OCUVITE ADULT 50+ PO) Take 1 tablet by mouth daily.    Yes Historical Provider, MD  nitroGLYCERIN (NITROSTAT) 0.4 MG SL tablet Place 1 tablet (0.4 mg total) under the tongue every 5 (five) minutes as needed for chest pain. 09/25/15  Yes Lorretta Harp, MD  Omega-3 Fatty Acids (FISH OIL) 1200 MG CAPS Take 1,200 mg by mouth daily.    Yes Historical Provider, MD  OVER THE COUNTER MEDICATION Place 1 drop into both eyes daily as needed (itching). Over the counter allergy eye drops   Yes Historical Provider, MD  pantoprazole (PROTONIX) 40 MG tablet Take 1 tablet (40 mg total) by mouth daily. 03/19/15  Yes Lorretta Harp, MD  polyethylene glycol [facility] / Floria Raveling) packet Take 17 g by mouth daily as needed (constipation).    Yes Historical Provider, MD  pyridOXINE (VITAMIN B-6) 50 MG tablet Take 50 mg by mouth daily.   Yes Historical Provider, MD  simvastatin (ZOCOR) 10 MG tablet Take 1 tablet (10 mg total) by mouth at bedtime. 03/19/15  Yes Lorretta Harp, MD  albuterol (PROVENTIL HFA;VENTOLIN HFA) 108 (90 BASE) MCG/ACT inhaler Inhale 1 puff into the lungs every 6 (six) hours as needed for wheezing or shortness of breath.    Historical Provider, MD   BP 171/73 mmHg  Pulse 61  Temp(Src) 97.7 F (36.5 C) (Oral)  Resp 23  Ht 5\' 6"  (1.676 m)  Wt 180 lb (81.647 kg)  BMI 29.07 kg/m2  SpO2 100% Physical Exam  Constitutional: He is oriented to person, place, and time. He appears well-developed and well-nourished.  Patient is alert and nontoxic. GCS of 15. No respiratory distress.  HENT:  The patient has a 4 cm hematoma to the left forehead. There is overlying skin tear. One area is avulsion of approximately 1 cm another approximately 1 cm area has skin tear with flap still present.  Patient has another very minor skin tear to the left zygoma. This is only about 4 mm.  Third very small skin tear to the  bridge of the nose. No nasal bleeding.  Eyes: EOM are normal. Pupils are equal, round, and reactive to light.  Neck: Neck supple.  Cardiovascular: Normal rate, regular rhythm and intact distal pulses.   2/6 SEM  Pulmonary/Chest: Effort normal and breath sounds normal. He exhibits no tenderness.  Abdominal: Soft. Bowel sounds are normal. He exhibits no distension. There is no tenderness.  Musculoskeletal: Normal range of motion. He exhibits no edema.  Skin tear to left upper extremity. 3cm flap to dorsal forearm.   3 cm flap to rt dorsum hand.  ROM intact for all extremities. No deformities.  Neurological:  He is alert and oriented to person, place, and time. He has normal strength. No cranial nerve deficit. He exhibits normal muscle tone. Coordination normal. GCS eye subscore is 4. GCS verbal subscore is 5. GCS motor subscore is 6.  Skin: Skin is warm, dry and intact.  Psychiatric: He has a normal mood and affect.    ED Course  Procedures (including critical care time) Labs Review Labs Reviewed - No data to display  Imaging Review Ct Head Wo Contrast  12/31/2015  CLINICAL DATA:  Fall today with left forehead abrasion and hematoma. No loss of consciousness. EXAM: CT HEAD WITHOUT CONTRAST TECHNIQUE: Contiguous axial images were obtained from the base of the skull through the vertex without intravenous contrast. COMPARISON:  CT head 11/12/2015 and 01/15/2015 FINDINGS: Brain: There is no evidence of acute intracranial hemorrhage, mass lesion, brain edema or extra-axial fluid collection. There is stable generalized atrophy with prominence of the ventricles and subarachnoid spaces. There are chronic small vessel ischemic changes with a stable old infarct in the right occipital lobe. There is no CT evidence of acute cortical infarction. Intracranial vascular calcifications are present. Bones/sinuses/visualized face: The visualized paranasal sinuses, mastoid air cells and middle ears are clear. There  is soft tissue swelling in the left frontal scalp. The calvarium is intact. Previous scleral banding bilaterally. 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 No evidence of calvarial fracture or acute intracranial findings. Electronically Signed   By: Richardean Sale M.D.   On: 12/31/2015 12:31   I have personally reviewed and evaluated these images and lab results as part of my medical decision-making.   EKG Interpretation None      MDM   Final diagnoses:  Fall, initial encounter  Head injury, initial encounter  Hematoma  Abrasion, multiple sites   CT no acute abnormality. Baseline MS, no neurologic dysfunction. Wounds all superficial skin tears, none amenable to suturing. Wound care reviewed with application of Tegaderm for skin tears.    Charlesetta Shanks, MD 12/31/15 [phone]

## 2015-12-31 NOTE — ED Notes (Addendum)
Pt was helping his son pick up limbs and when bending over to pick up limb pt lost his balance and fell onto head and has multiple skin tears. Pt has large hematoma to left side of head. Pt is on Plavix. Skin tear to right hand and left elbow. Bleeding is controlled with gauze. Pt denies any loss of consciousness.

## 2015-12-31 NOTE — ED Notes (Signed)
Per son pt was bleeding from skin tears on rt hand and lefty arm bleeding controlled at this time also has big lump to left forehead no loc pt able to walk as well as before and per  Son his  Mentation has not changed since accident

## 2015-12-31 NOTE — Discharge Instructions (Signed)
Head Injury, Adult You have received a head injury. It does not appear serious at this time. Headaches and vomiting are common following head injury. It should be easy to awaken from sleeping. Sometimes it is necessary for you to stay in the emergency department for a while for observation. Sometimes admission to the hospital may be needed. After injuries such as yours, most problems occur within the first 24 hours, but side effects may occur up to 7-10 days after the injury. It is important for you to carefully monitor your condition and contact your health care provider or seek immediate medical care if there is a change in your condition. WHAT ARE THE TYPES OF HEAD INJURIES? Head injuries can be as minor as a bump. Some head injuries can be more severe. More severe head injuries include:  A jarring injury to the brain (concussion).  A bruise of the brain (contusion). This mean there is bleeding in the brain that can cause swelling.  A cracked skull (skull fracture).  Bleeding in the brain that collects, clots, and forms a bump (hematoma). WHAT CAUSES A HEAD INJURY? A serious head injury is most likely to happen to someone who is in a car wreck and is not wearing a seat belt. Other causes of major head injuries include bicycle or motorcycle accidents, sports injuries, and falls. HOW ARE HEAD INJURIES DIAGNOSED? A complete history of the event leading to the injury and your current symptoms will be helpful in diagnosing head injuries. Many times, pictures of the brain, such as CT or MRI are needed to see the extent of the injury. Often, an overnight hospital stay is necessary for observation.  WHEN SHOULD I SEEK IMMEDIATE MEDICAL CARE?  You should get help right away if:  You have confusion or drowsiness.  You feel sick to your stomach (nauseous) or have continued, forceful vomiting.  You have dizziness or unsteadiness that is getting worse.  You have severe, continued headaches not  relieved by medicine. Only take over-the-counter or prescription medicines for pain, fever, or discomfort as directed by your health care provider.  You do not have normal function of the arms or legs or are unable to walk.  You notice changes in the black spots in the center of the colored part of your eye (pupil).  You have a clear or bloody fluid coming from your nose or ears.  You have a loss of vision. During the next 24 hours after the injury, you must stay with someone who can watch you for the warning signs. This person should contact local emergency services (911 in the [place] if you have seizures, you become unconscious, or you are unable to wake up. HOW CAN I PREVENT A HEAD INJURY IN THE FUTURE? The most important factor for preventing major head injuries is avoiding motor vehicle accidents. To minimize the potential for damage to your head, it is crucial to wear seat belts while riding in motor vehicles. Wearing helmets while bike riding and playing collision sports (like football) is also helpful. Also, avoiding dangerous activities around the house will further help reduce your risk of head injury.  WHEN CAN I RETURN TO NORMAL ACTIVITIES AND ATHLETICS? You should be reevaluated by your health care provider before returning to these activities. If you have any of the following symptoms, you should not return to activities or contact sports until 1 week after the symptoms have stopped:  Persistent headache.  Dizziness or vertigo.  Poor attention and concentration.  Confusion.  Memory problems.  Nausea or vomiting.  Fatigue or tire easily.  Irritability.  Intolerant of bright lights or loud noises.  Anxiety or depression.  Disturbed sleep. MAKE SURE YOU:   Understand these instructions.  Will watch your condition.  Will get help right away if you are not doing well or get worse.   This information is not intended to replace advice given to you by your health  care provider. Make sure you discuss any questions you have with your health care provider.   Document Released: 06/28/2005 Document Revised: 07/19/2014 Document Reviewed: 03/05/2013 Elsevier Interactive Patient Education 2016 [employer] Skin Tear Care A skin tear is a wound in which the top layer of skin has peeled off. This is a common problem with aging because the skin becomes thinner and more fragile as a person gets older. In addition, some medicines, such as oral corticosteroids, can lead to skin thinning if taken for long periods of time.  A skin tear is often repaired with tape or skin adhesive strips. This keeps the skin that has been peeled off in contact with the healthier skin beneath. Depending on the location of the wound, a bandage (dressing) may be applied over the tape or skin adhesive strips. Sometimes, during the healing process, the skin turns black and dies. Even when this happens, the torn skin acts as a good dressing until the skin underneath gets healthier and repairs itself. HOME CARE INSTRUCTIONS   Leave the Tegaderm in place  Keep all follow-up appointments as directed by your caregiver.  SEEK IMMEDIATE MEDICAL CARE IF:   You have redness, swelling, or increasing pain in the skin tear.  You havepus coming from the skin tear.  You have chills.  You have a red streak that goes away from the skin tear.  You have a bad smell coming from the tear or dressing.  You have a fever or persistent symptoms for more than 2-3 days.  You have a fever and your symptoms suddenly get worse. MAKE SURE YOU:  Understand these instructions.  Will watch this condition.  Will get help right away if your child is not doing well or gets worse.   This information is not intended to replace advice given to you by your health care provider. Make sure you discuss any questions you have with your health care provider.   Document Released: 03/23/2001 Document Revised:  03/22/2012 Document Reviewed: 01/10/2012 Elsevier Interactive Patient Education [employer].

## 2015-12-31 NOTE — ED Notes (Signed)
Brought patient back to room via wheelchair with family in tow; patient undressed, in gown, on monitor, continuous pulse oximetry and blood pressure cuff; visitor at bedside

## 2016-01-05 DIAGNOSIS — Z23 Encounter for immunization: Secondary | ICD-10-CM | POA: Diagnosis not present

## 2016-01-05 DIAGNOSIS — L600 Ingrowing nail: Secondary | ICD-10-CM | POA: Diagnosis not present

## 2016-01-05 DIAGNOSIS — S41119A Laceration without foreign body of unspecified upper arm, initial encounter: Secondary | ICD-10-CM | POA: Diagnosis not present

## 2016-01-05 DIAGNOSIS — T148 Other injury of unspecified body region: Secondary | ICD-10-CM | POA: Diagnosis not present

## 2016-01-05 DIAGNOSIS — R600 Localized edema: Secondary | ICD-10-CM | POA: Diagnosis not present

## 2016-01-05 DIAGNOSIS — S61419A Laceration without foreign body of unspecified hand, initial encounter: Secondary | ICD-10-CM | POA: Diagnosis not present

## 2016-01-06 DIAGNOSIS — M79605 Pain in left leg: Secondary | ICD-10-CM | POA: Diagnosis not present

## 2016-01-06 DIAGNOSIS — M7989 Other specified soft tissue disorders: Secondary | ICD-10-CM | POA: Diagnosis not present

## 2016-01-09 DIAGNOSIS — S2242XD Multiple fractures of ribs, left side, subsequent encounter for fracture with routine healing: Secondary | ICD-10-CM | POA: Diagnosis not present

## 2016-01-09 DIAGNOSIS — Z86711 Personal history of pulmonary embolism: Secondary | ICD-10-CM | POA: Diagnosis not present

## 2016-01-09 DIAGNOSIS — L03032 Cellulitis of left toe: Secondary | ICD-10-CM | POA: Diagnosis not present

## 2016-01-09 DIAGNOSIS — M79675 Pain in left toe(s): Secondary | ICD-10-CM | POA: Diagnosis not present

## 2016-01-09 DIAGNOSIS — R7989 Other specified abnormal findings of blood chemistry: Secondary | ICD-10-CM | POA: Diagnosis not present

## 2016-01-21 ENCOUNTER — Telehealth: Payer: Self-pay | Admitting: Cardiovascular Disease

## 2016-01-21 NOTE — Telephone Encounter (Signed)
New message   Pt daughter is calling to speak to rn about her father being tired

## 2016-01-21 NOTE — Telephone Encounter (Signed)
Left message to call back  

## 2016-01-22 NOTE — Telephone Encounter (Signed)
Follow up   Pt daughter is returning missed call   [phone]

## 2016-01-22 NOTE — Telephone Encounter (Signed)
Left message to call back  

## 2016-01-22 NOTE — Telephone Encounter (Signed)
F/u  Pt dtr returning RN phone call. Please call back and discuss.   

## 2016-01-22 NOTE — Telephone Encounter (Signed)
Spoke with patients daughter and he saw his PCP recently regarding increased fatigue and sleeping more PCP did multiple tests including a D-dimer which was elevated. Patient was ruled out for DVT and PE Per daughter patient is sleeping and eating well Does not want to do as many things as before secondary to fatigue  Daughter is concerned coming from cardiac issues Requested that she have records from PCP faxed over

## 2016-01-23 DIAGNOSIS — L600 Ingrowing nail: Secondary | ICD-10-CM | POA: Diagnosis not present

## 2016-01-23 NOTE — Telephone Encounter (Deleted)
No records received, will forward to Dr Gwenlyn Found for review

## 2016-01-23 NOTE — Telephone Encounter (Signed)
Left message to call back  

## 2016-02-04 DIAGNOSIS — S80811A Abrasion, right lower leg, initial encounter: Secondary | ICD-10-CM | POA: Diagnosis not present

## 2016-02-04 DIAGNOSIS — L218 Other seborrheic dermatitis: Secondary | ICD-10-CM | POA: Diagnosis not present

## 2016-02-04 DIAGNOSIS — L0390 Cellulitis, unspecified: Secondary | ICD-10-CM | POA: Diagnosis not present

## 2016-02-04 DIAGNOSIS — S80812A Abrasion, left lower leg, initial encounter: Secondary | ICD-10-CM | POA: Diagnosis not present

## 2016-02-04 DIAGNOSIS — L089 Local infection of the skin and subcutaneous tissue, unspecified: Secondary | ICD-10-CM | POA: Diagnosis not present

## 2016-02-05 DIAGNOSIS — T8189XA Other complications of procedures, not elsewhere classified, initial encounter: Secondary | ICD-10-CM | POA: Diagnosis not present

## 2016-02-09 DIAGNOSIS — S80811A Abrasion, right lower leg, initial encounter: Secondary | ICD-10-CM | POA: Diagnosis not present

## 2016-02-09 DIAGNOSIS — R609 Edema, unspecified: Secondary | ICD-10-CM | POA: Diagnosis not present

## 2016-02-09 DIAGNOSIS — T798XXA Other early complications of trauma, initial encounter: Secondary | ICD-10-CM | POA: Diagnosis not present

## 2016-02-11 DIAGNOSIS — L089 Local infection of the skin and subcutaneous tissue, unspecified: Secondary | ICD-10-CM | POA: Diagnosis not present

## 2016-03-07 ENCOUNTER — Emergency Department (HOSPITAL_COMMUNITY): Payer: Commercial Managed Care - HMO

## 2016-03-07 ENCOUNTER — Emergency Department (HOSPITAL_COMMUNITY)
Admission: EM | Admit: 2016-03-07 | Discharge: 2016-03-07 | Disposition: A | Discharge status: Home / Self Care | Payer: Commercial Managed Care - HMO | Attending: Emergency Medicine | Admitting: Emergency Medicine

## 2016-03-07 ENCOUNTER — Encounter (HOSPITAL_COMMUNITY): Payer: Self-pay | Admitting: Emergency Medicine

## 2016-03-07 DIAGNOSIS — S0990XA Unspecified injury of head, initial encounter: Secondary | ICD-10-CM

## 2016-03-07 DIAGNOSIS — S80211A Abrasion, right knee, initial encounter: Secondary | ICD-10-CM | POA: Diagnosis not present

## 2016-03-07 DIAGNOSIS — I5032 Chronic diastolic (congestive) heart failure: Secondary | ICD-10-CM | POA: Diagnosis not present

## 2016-03-07 DIAGNOSIS — N179 Acute kidney failure, unspecified: Secondary | ICD-10-CM | POA: Insufficient documentation

## 2016-03-07 DIAGNOSIS — Z955 Presence of coronary angioplasty implant and graft: Secondary | ICD-10-CM | POA: Diagnosis not present

## 2016-03-07 DIAGNOSIS — S0083XA Contusion of other part of head, initial encounter: Secondary | ICD-10-CM | POA: Diagnosis not present

## 2016-03-07 DIAGNOSIS — Z7901 Long term (current) use of anticoagulants: Secondary | ICD-10-CM | POA: Insufficient documentation

## 2016-03-07 DIAGNOSIS — W010XXA Fall on same level from slipping, tripping and stumbling without subsequent striking against object, initial encounter: Secondary | ICD-10-CM | POA: Diagnosis not present

## 2016-03-07 DIAGNOSIS — S8991XA Unspecified injury of right lower leg, initial encounter: Secondary | ICD-10-CM | POA: Diagnosis not present

## 2016-03-07 DIAGNOSIS — I2510 Atherosclerotic heart disease of native coronary artery without angina pectoris: Secondary | ICD-10-CM | POA: Insufficient documentation

## 2016-03-07 DIAGNOSIS — S86911A Strain of unspecified muscle(s) and tendon(s) at lower leg level, right leg, initial encounter: Secondary | ICD-10-CM | POA: Insufficient documentation

## 2016-03-07 DIAGNOSIS — Y999 Unspecified external cause status: Secondary | ICD-10-CM | POA: Diagnosis not present

## 2016-03-07 DIAGNOSIS — R51 Headache: Secondary | ICD-10-CM | POA: Diagnosis not present

## 2016-03-07 DIAGNOSIS — Z7982 Long term (current) use of aspirin: Secondary | ICD-10-CM | POA: Insufficient documentation

## 2016-03-07 DIAGNOSIS — I130 Hypertensive heart and chronic kidney disease with heart failure and stage 1 through stage 4 chronic kidney disease, or unspecified chronic kidney disease: Secondary | ICD-10-CM | POA: Diagnosis not present

## 2016-03-07 DIAGNOSIS — S0031XA Abrasion of nose, initial encounter: Secondary | ICD-10-CM | POA: Insufficient documentation

## 2016-03-07 DIAGNOSIS — Y92007 Garden or yard of unspecified non-institutional (private) residence as the place of occurrence of the external cause: Secondary | ICD-10-CM | POA: Insufficient documentation

## 2016-03-07 DIAGNOSIS — N183 Chronic kidney disease, stage 3 (moderate): Secondary | ICD-10-CM | POA: Insufficient documentation

## 2016-03-07 DIAGNOSIS — Z87891 Personal history of nicotine dependence: Secondary | ICD-10-CM | POA: Insufficient documentation

## 2016-03-07 DIAGNOSIS — W19XXXA Unspecified fall, initial encounter: Secondary | ICD-10-CM

## 2016-03-07 DIAGNOSIS — S4992XA Unspecified injury of left shoulder and upper arm, initial encounter: Secondary | ICD-10-CM | POA: Diagnosis not present

## 2016-03-07 DIAGNOSIS — S0033XA Contusion of nose, initial encounter: Secondary | ICD-10-CM | POA: Diagnosis not present

## 2016-03-07 DIAGNOSIS — Y9389 Activity, other specified: Secondary | ICD-10-CM | POA: Insufficient documentation

## 2016-03-07 DIAGNOSIS — M25512 Pain in left shoulder: Secondary | ICD-10-CM | POA: Diagnosis not present

## 2016-03-07 DIAGNOSIS — S0993XA Unspecified injury of face, initial encounter: Secondary | ICD-10-CM | POA: Diagnosis not present

## 2016-03-07 LAB — I-STAT CHEM 8, ED
BUN: 26 mg/dL — ABNORMAL HIGH (ref 6–20)
Calcium, Ion: 0.9 mmol/L — ABNORMAL LOW (ref 1.12–1.23)
Chloride: 111 mmol/L (ref 101–111)
Creatinine, Ser: 1.3 mg/dL — ABNORMAL HIGH (ref 0.61–1.24)
Glucose, Bld: 107 mg/dL — ABNORMAL HIGH (ref 65–99)
HEMATOCRIT: 34 % — AB (ref 39.0–52.0)
HEMOGLOBIN: 11.6 g/dL — AB (ref 13.0–17.0)
POTASSIUM: 4.1 mmol/L (ref 3.5–5.1)
Sodium: 139 mmol/L (ref 135–145)
TCO2: 18 mmol/L (ref 0–100)

## 2016-03-07 MED ORDER — ACETAMINOPHEN 500 MG PO TABS
1000.0000 mg | ORAL_TABLET | Freq: Once | ORAL | Status: AC
  Administered 2016-03-07: 1000 mg via ORAL
  Filled 2016-03-07: qty 2

## 2016-03-07 NOTE — ED Provider Notes (Signed)
[facility] DEPT Provider Note   CSN: [id number] Arrival date & time: 03/07/16  1606     History   Chief Complaint Chief Complaint  Patient presents with  . Fall  . Facial Injury    HPI Larry Huffman is a 82 y.o. male.  Patient with coronary artery disease on Plavix, high blood pressure, hearing difficulty, cholesterol presents after likely mechanical fall. Patient was working in the yard and tripped and fell forward hitting his face. Patient has other aches and pains have her primary injuries facial bones pain with palpation and mild abrasion and bleeding. No loss of consciousness. No recent infectious or fevers.      Past Medical History:  Diagnosis Date  . Acid reflux   . Arthritis   . CAD (coronary artery disease)   . Cancer (HCC) hx of skin cancer  . Constipation   . Coronary artery disease   . High cholesterol   . HOH (hard of hearing)    wears hearing aids, but still can not hear  . Hypertension   . Incontinence of urine   . Peripheral vascular disease ([place])    Remote left carotid endarterectomy  . Sleep apnea    CPAP    Patient Active Problem List   Diagnosis Date Noted  . Long-term (current) use of anticoagulants 02/21/2015  . Allergic rhinitis 01/24/2015  . Malnutrition of moderate degree ([place]) 01/16/2015  . Warfarin-induced coagulopathy ([place])   . Melena   . Severe anemia 01/15/2015  . GI bleed 01/15/2015  . Acute blood loss anemia 01/15/2015  . Chronic anticoagulation 06/04/2014  . Dementia 04/19/2014  . Chronic diastolic heart failure ([place]) 04/15/2014  . Chronic kidney disease (CKD), stage III (moderate) 04/15/2014  . Pulmonary embolism ([place]) 04/13/2014  . Spinal stenosis, lumbar region, with neurogenic claudication 02/08/2014    Class: Diagnosis of  . Peripheral arterial disease: History of left carotid endarterectomy 03/15/2013  . Obstructive sleep apnea: Uses CPAP 03/15/2013  . Venous insufficiency 03/15/2013  . Essential hypertension  03/15/2013  . HLD (hyperlipidemia) 03/15/2013  . Chest pain 07/28/2012  . CAD - CFX DES 7/07, LAD DES 10/08, cath 11/11- medical Rx 07/28/2012    Past Surgical History:  Procedure Laterality Date  . 2-D echocardiogram  03/07/2008   Ejection fraction greater than 55%. Mild concentric left ventricular hypertrophy.LV relaxation. mildly dilated left atrium. Mild mitral regurgitation.   . ANAL FISSURE REPAIR    . CARDIAC CATHETERIZATION     X 2 stents  . COLON SURGERY     removed part of the small intestine  . COLONOSCOPY W/ POLYPECTOMY    . CORONARY STENT PLACEMENT    . ESOPHAGOGASTRODUODENOSCOPY N/A 01/16/2015   Procedure: ESOPHAGOGASTRODUODENOSCOPY (EGD);  Surgeon: Irene Shipper, MD;  Location: [facility] ENDOSCOPY;  Service: Endoscopy;  Laterality: N/A;  . EYE SURGERY     cataracts  . I&D EXTREMITY  06/28/2012   Procedure: IRRIGATION AND DEBRIDEMENT EXTREMITY;  Surgeon: Newt Minion, MD;  Location: [facility];  Service: Orthopedics;  Laterality: Right;  Debridement Wound Right Leg, VAC, Antibiotic Beads, Apply A-Cell  . IRRIGATION AND DEBRIDEMENT ABSCESS    . LAPAROTOMY    . LUMBAR LAMINECTOMY N/A 02/08/2014   Procedure: L3-4, L4-5 Decompression;  Surgeon: Marybelle Killings, MD;  Location: [facility];  Service: Orthopedics;  Laterality: N/A;  L3-4, L4-5 Decompression  . NASAL SINUS SURGERY    . PROSTATE SURGERY    . ROTATOR CUFF REPAIR  Home Medications    Prior to Admission medications   Medication Sig Start Date End Date Taking? Authorizing Provider  acetaminophen (TYLENOL) 500 MG tablet Take 1,000 mg by mouth 2 (two) times daily as needed for mild pain.    Yes Historical Provider, MD  albuterol (PROVENTIL HFA;VENTOLIN HFA) 108 (90 BASE) MCG/ACT inhaler Inhale 1 puff into the lungs every 6 (six) hours as needed for wheezing or shortness of breath.   Yes Historical Provider, MD  amLODipine (NORVASC) 10 MG tablet Take 10 mg by mouth daily.   Yes Historical Provider, MD  Ascorbic Acid (VITAMIN  C) 1000 MG tablet Take 1,000 mg by mouth daily. 07/01/10  Yes Historical Provider, MD  aspirin EC 81 MG tablet Take 81 mg by mouth at bedtime.    Yes Historical Provider, MD  clobetasol (TEMOVATE) 0.05 % external solution Apply 1 application topically See admin instructions. 1 application every morning, and 1 application twice daily during break outs 11/03/15  Yes Historical Provider, MD  clopidogrel (PLAVIX) 75 MG tablet Take 1 tablet (75 mg total) by mouth daily. 03/19/15  Yes Lorretta Harp, MD  ferrous sulfate 325 (65 FE) MG tablet Take 325 mg by mouth daily with breakfast.   Yes Historical Provider, MD  hydrOXYzine (VISTARIL) 25 MG capsule Take 25 mg by mouth every morning.    Yes Historical Provider, MD  loratadine (CLARITIN) 10 MG tablet Take 5 mg by mouth daily.   Yes Historical Provider, MD  montelukast (SINGULAIR) 10 MG tablet Take 10 mg by mouth at bedtime.   Yes Historical Provider, MD  Multiple Vitamin (MULTIVITAMIN WITH MINERALS) TABS Take 1 tablet by mouth daily. [employer]J's brand multivitamin   Yes Historical Provider, MD  Multiple Vitamins-Minerals (OCUVITE ADULT 50+ PO) Take 1 tablet by mouth daily.    Yes Historical Provider, MD  mupirocin ointment (BACTROBAN) 2 % Apply 1 application topically at bedtime.  02/11/16  Yes Historical Provider, MD  nitroGLYCERIN (NITROSTAT) 0.4 MG SL tablet Place 1 tablet (0.4 mg total) under the tongue every 5 (five) minutes as needed for chest pain. 09/25/15  Yes Lorretta Harp, MD  Omega-3 Fatty Acids (FISH OIL) 1200 MG CAPS Take 1,200 mg by mouth daily.    Yes Historical Provider, MD  OVER THE COUNTER MEDICATION Place 1 drop into both eyes daily as needed (itching). Over the counter allergy eye drops   Yes Historical Provider, MD  pantoprazole (PROTONIX) 40 MG tablet Take 1 tablet (40 mg total) by mouth daily. 03/19/15  Yes Lorretta Harp, MD  polyethylene glycol [facility] / GLYCOLAX) packet Take 17 g by mouth every morning.    Yes Historical Provider, MD    pyridOXINE (VITAMIN B-6) 50 MG tablet Take 50 mg by mouth daily.   Yes Historical Provider, MD  simvastatin (ZOCOR) 10 MG tablet Take 1 tablet (10 mg total) by mouth at bedtime. 03/19/15  Yes Lorretta Harp, MD    Family History Family History  Problem Relation Age of Onset  . Hypertension Mother     Social History Social History  Substance Use Topics  . Smoking status: Former Smoker    Types: Cigarettes    Quit date: 07/12/1978  . Smokeless tobacco: Never Used  . Alcohol use No     Allergies   Ciprofloxacin; Tape; and Dapsone   Review of Systems Review of Systems  Constitutional: Negative for chills and fever.  HENT: Negative for congestion.   Eyes: Negative for visual disturbance.  Respiratory: Negative  for shortness of breath.   Cardiovascular: Negative for chest pain.  Gastrointestinal: Negative for abdominal pain and vomiting.  Genitourinary: Negative for dysuria and flank pain.  Musculoskeletal: Positive for arthralgias. Negative for back pain, neck pain and neck stiffness.  Skin: Positive for wound. Negative for rash.  Neurological: Positive for headaches. Negative for syncope and light-headedness.     Physical Exam Updated Vital Signs BP 166/64 (BP Location: Right Arm)   Pulse 79   Temp 98.6 F (37 C) (Oral)   Resp 14   SpO2 98%   Physical Exam  Constitutional: He is oriented to person, place, and time. He appears well-developed and well-nourished.  HENT:  Head: Normocephalic.  Patient has tenderness nasal bridge with mild superficial abrasion bleeding controlled. Patient has mild swelling and mild bleeding upper lip no significant laceration. No trismus and no tenderness with opening mouth. No step-offs. No septal hematoma. Left black eye.  Eyes: Conjunctivae are normal. Right eye exhibits no discharge. Left eye exhibits no discharge.  Neck: Normal range of motion. Neck supple. No tracheal deviation present.  Cardiovascular: Normal rate and regular  rhythm.   Pulmonary/Chest: Effort normal and breath sounds normal.  Abdominal: Soft. He exhibits no distension. There is no tenderness. There is no guarding.  Musculoskeletal: He exhibits edema and tenderness.  Patient has no midline cervical thoracic or lumbar tenderness. Patient has no significant hip tenderness bilateral. Patient has mild right knee tenderness anterior without significant effusion, mild left shoulder tenderness anterior without effusion. No significant elbow or wrist tenderness.  Neurological: He is alert and oriented to person, place, and time.  Skin: Skin is warm. No rash (superficial abrasion) noted.  Psychiatric: He has a normal mood and affect.  Nursing note and vitals reviewed.    ED Treatments / Results  Labs (all labs ordered are listed, but only abnormal results are displayed) Labs Reviewed  I-STAT CHEM 8, ED - Abnormal; Notable for the following:       Result Value   BUN 26 (*)    Creatinine, Ser 1.30 (*)    Glucose, Bld 107 (*)    Calcium, Ion 0.90 (*)    Hemoglobin 11.6 (*)    HCT 34.0 (*)    All other components within normal limits    EKG  EKG Interpretation None       Radiology No results found.  Procedures Procedures (including critical care time)  Medications Ordered in ED Medications  acetaminophen (TYLENOL) tablet 1,000 mg (1,000 mg Oral Given 03/07/16 1937)     Initial Impression / Assessment and Plan / ED Course  I have reviewed the triage vital signs and the nursing notes.  Pertinent labs & imaging results that were available during my care of the patient were reviewed by me and considered in my medical decision making (see chart for details).  Clinical Course   Patient presents after mechanical fall with significant facial injuries. CT scan no acute abnormalities. X-rays of bony tenderness no fractures. Wound care by nursing staff. Pain meds ordered. Plan for i-STAT Chem-8 to check hemoglobin to ensure did not contribute  to his fall. No active bleeding aside from skin wounds  Results and differential diagnosis were discussed with the patient/parent/guardian. Xrays were independently reviewed by myself.  Close follow up outpatient was discussed, comfortable with the plan.   Medications  acetaminophen (TYLENOL) tablet 1,000 mg (1,000 mg Oral Given 03/07/16 1937)    Vitals:   03/07/16 1612 03/07/16 1839 03/07/16 1921 03/07/16 2028  BP: 167/87 152/79 176/70 166/64  Pulse: 81 74 83 79  Resp: 20 18 26 14   Temp: 98.1 F (36.7 C) 98 F (36.7 C) 98.3 F (36.8 C) 98.6 F (37 C)  TempSrc: Oral Oral Oral Oral  SpO2: 100% 100% 100% 98%    Final diagnoses:  Facial contusion, initial encounter  Fall, initial encounter  Head injury, initial encounter  Knee strain, right, initial encounter  Acute renal failure, unspecified acute renal failure type [facility])     Final Clinical Impressions(s) / ED Diagnoses   Final diagnoses:  Facial contusion, initial encounter  Fall, initial encounter  Head injury, initial encounter  Knee strain, right, initial encounter  Acute renal failure, unspecified acute renal failure type [facility])    New Prescriptions Discharge Medication List as of 03/07/2016  8:17 PM       Elnora Morrison, MD 03/11/16 [phone]

## 2016-03-07 NOTE — ED Provider Notes (Signed)
MSE was initiated and I personally evaluated the patient and placed orders (if any) at  4:31 PM on March 07, 2016. Patient tripped and fell. Complains of facial pain and headache, left hand pain and right knee pain and left shoulder pain since event. On exam he is alert Glasgow Coma Score 15 HEENT exam multiple facial abrasions neck supple full range of motion without pain, no midline tenderness. Left upper extremity skin intact. He is tender at shoulder with active motion. He has full range of motion. Hand is nontender. Radial pulse 2+ right lower extremity there is an abrasion overlying the anterior knee. With corresponding tenderness. All 4 extremity is neurovascular intact. Neurologic Glasgow Coma Score 15 moves all extremities well cranial nerves II through XII grossly intact  The patient appears stable so that the remainder of the MSE may be completed by another provider.   Orlie Dakin, MD 03/07/16 [phone]

## 2016-03-07 NOTE — ED Notes (Signed)
Patient is in stable condition, verbalizes understanding of discharge instructions, going home with daughter.

## 2016-03-07 NOTE — Discharge Instructions (Addendum)
Stay well hydrated.  Keep wounds clean.  Apply topical antibiotics once daily for the next 3-4 days. Return for confusion, severe headache, vomiting, neurologic symptoms or other concerns.  If you were given medicines take as directed.  If you are on coumadin or contraceptives realize their levels and effectiveness is altered by many different medicines.  If you have any reaction (rash, tongues swelling, other) to the medicines stop taking and see a physician.    If your blood pressure was elevated in the ER make sure you follow up for management with a primary doctor or return for chest pain, shortness of breath or stroke symptoms.  Please follow up as directed and return to the ER or see a physician for new or worsening symptoms.  Thank you. Vitals:   03/07/16 1612 03/07/16 1839 03/07/16 1921  BP: 167/87 152/79 176/70  Pulse: 81 74 83  Resp: 20 18 26   Temp: 98.1 F (36.7 C) 98 F (36.7 C) 98.3 F (36.8 C)  TempSrc: Oral Oral Oral  SpO2: 100% 100% 100%

## 2016-03-07 NOTE — ED Triage Notes (Signed)
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 Pt denies LOC. Pt reports taking plavix. Pt has multiple lac and abrasions to face. No broken teeth noted. Pt also has pain and swelling to left hand, skin tear and pain to right knee. Pt a/o x 4, denies numbness/tingling.

## 2016-03-10 DIAGNOSIS — K529 Noninfective gastroenteritis and colitis, unspecified: Secondary | ICD-10-CM | POA: Diagnosis not present

## 2016-03-10 DIAGNOSIS — T148 Other injury of unspecified body region: Secondary | ICD-10-CM | POA: Diagnosis not present

## 2016-03-10 DIAGNOSIS — Z23 Encounter for immunization: Secondary | ICD-10-CM | POA: Diagnosis not present

## 2016-03-10 DIAGNOSIS — W19XXXA Unspecified fall, initial encounter: Secondary | ICD-10-CM | POA: Diagnosis not present

## 2016-03-19 ENCOUNTER — Encounter: Payer: Self-pay | Admitting: Cardiovascular Disease

## 2016-03-19 ENCOUNTER — Ambulatory Visit (INDEPENDENT_AMBULATORY_CARE_PROVIDER_SITE_OTHER): Payer: Commercial Managed Care - HMO | Admitting: Cardiovascular Disease

## 2016-03-19 DIAGNOSIS — E785 Hyperlipidemia, unspecified: Secondary | ICD-10-CM

## 2016-03-19 DIAGNOSIS — I2510 Atherosclerotic heart disease of native coronary artery without angina pectoris: Secondary | ICD-10-CM

## 2016-03-19 DIAGNOSIS — I10 Essential (primary) hypertension: Secondary | ICD-10-CM

## 2016-03-19 DIAGNOSIS — I739 Peripheral vascular disease, unspecified: Secondary | ICD-10-CM | POA: Diagnosis not present

## 2016-03-19 DIAGNOSIS — I5032 Chronic diastolic (congestive) heart failure: Secondary | ICD-10-CM

## 2016-03-19 DIAGNOSIS — I2583 Coronary atherosclerosis due to lipid rich plaque: Secondary | ICD-10-CM

## 2016-03-19 NOTE — Assessment & Plan Note (Signed)
History of carotid artery disease status post left carotid endarterectomy past with Dopplers performed 10/02/13 revealing a widely patent endarterectomy site with mild bilateral ICA stenosis.

## 2016-03-19 NOTE — Assessment & Plan Note (Signed)
History of hyperlipidemia on statin therapy followed by his PCP 

## 2016-03-19 NOTE — Assessment & Plan Note (Signed)
History of hypertension blood pressure measured 140/50. He is on amlodipine. Continue current meds at current dosing

## 2016-03-19 NOTE — Progress Notes (Signed)
03/19/2016 Larry Huffman   [birth year]  [id number]  Primary Physician Larry Harrow, PA-C Primary Cardiologist: Larry Harp MD Larry Huffman  HPI:  The patient returns today for followup. He is an 82 year old mildly overweight, married Caucasian male, father of 2, grandfather to 8 grandchildren who I saw in the office 11/12/15. He is accompanied by his eldest son Larry Huffman today. He has a history of CAD status post LAD and circumflex stenting in the past with drug-eluting stents and subsequent negative Myoview as recently as August 17, 2012. He has had left carotid endarterectomy remotely as well which we follow by duplex ultrasound. This was most recently done this past March of last year and was widely patent. He has normal lower extremity arterial Dopplers and venous Dopplers suggesting venous insufficiency. He has obstructive sleep apnea on CPAP followed by Larry Huffman. He saw Larry Huffman who thought he was a good candidate for endovenous ablation. He had admission in October of last year for acute pulmonary embolism and has been on Coumadin at coagulation. He really needs a urologic procedure and had a pharmacologic Myoview stress test performed in January of this year which was low with a nonischemic. Since I saw him back he has fallen and injured his head with excessive bleeding. He also developed shingles. His Coumadin has since been discontinued. He denies chest pain or shortness of breath. he has had several episodes of falling since I last saw him.  Current Outpatient Prescriptions  Medication Sig Dispense Refill  . acetaminophen (TYLENOL) 500 MG tablet Take 1,000 mg by mouth 2 (two) times daily as needed for mild pain.     Marland Kitchen albuterol (PROVENTIL HFA;VENTOLIN HFA) 108 (90 BASE) MCG/ACT inhaler Inhale 1 puff into the lungs every 6 (six) hours as needed for wheezing or shortness of breath.    Marland Kitchen amLODipine (NORVASC) 10 MG tablet Take 10 mg by mouth daily.    . Ascorbic  Acid (VITAMIN C) 1000 MG tablet Take 1,000 mg by mouth daily.    Marland Kitchen aspirin EC 81 MG tablet Take 81 mg by mouth at bedtime.     . clobetasol (TEMOVATE) 0.05 % external solution Apply 1 application topically See admin instructions. 1 application every morning, and 1 application twice daily during break outs    . clopidogrel (PLAVIX) 75 MG tablet Take 1 tablet (75 mg total) by mouth daily. 90 tablet 3  . ferrous sulfate 325 (65 FE) MG tablet Take 325 mg by mouth daily with breakfast.    . hydrOXYzine (VISTARIL) 25 MG capsule Take 25 mg by mouth every morning.     . loratadine (CLARITIN) 10 MG tablet Take 5 mg by mouth daily.    . montelukast (SINGULAIR) 10 MG tablet Take 10 mg by mouth at bedtime.    . Multiple Vitamin (MULTIVITAMIN WITH MINERALS) TABS Take 1 tablet by mouth daily. [employer] brand multivitamin    . Multiple Vitamins-Minerals (OCUVITE ADULT 50+ PO) Take 1 tablet by mouth daily.     . mupirocin ointment (BACTROBAN) 2 % Apply 1 application topically at bedtime.     . nitroGLYCERIN (NITROSTAT) 0.4 MG SL tablet Place 1 tablet (0.4 mg total) under the tongue every 5 (five) minutes as needed for chest pain. 25 tablet 3  . Omega-3 Fatty Acids (FISH OIL) 1200 MG CAPS Take 1,200 mg by mouth daily.     Marland Kitchen OVER THE COUNTER MEDICATION Place 1 drop into both eyes daily as  needed (itching). Over the counter allergy eye drops    . pantoprazole (PROTONIX) 40 MG tablet Take 1 tablet (40 mg total) by mouth daily. 90 tablet 3  . polyethylene glycol (MIRALAX / GLYCOLAX) packet Take 17 g by mouth every morning.     . pyridOXINE (VITAMIN B-6) 50 MG tablet Take 50 mg by mouth daily.    . simvastatin (ZOCOR) 10 MG tablet Take 1 tablet (10 mg total) by mouth at bedtime. 90 tablet 3   No current facility-administered medications for this visit.     Allergies  Allergen Reactions  . Ciprofloxacin Rash  . Tape Other (See Comments)    Pulls skin off.  Please use "paper tape" only.   . Dapsone Other (See  Comments)    Lowers WBC count?? "made me very sick"    Social History   Social History  . Marital status: Widowed    Spouse name: N/A  . Number of children: N/A  . Years of education: N/A   Occupational History  . Not on file.   Social History Main Topics  . Smoking status: Former Smoker    Types: Cigarettes    Quit date: 07/12/1978  . Smokeless tobacco: Never Used  . Alcohol use No  . Drug use: No  . Sexual activity: Not on file   Other Topics Concern  . Not on file   Social History Narrative  . No narrative on file     Review of Systems: General: negative for chills, fever, night sweats or weight changes.  Cardiovascular: negative for chest pain, dyspnea on exertion, edema, orthopnea, palpitations, paroxysmal nocturnal dyspnea or shortness of breath Dermatological: negative for rash Respiratory: negative for cough or wheezing Urologic: negative for hematuria Abdominal: negative for nausea, vomiting, diarrhea, bright red blood per rectum, melena, or hematemesis Neurologic: negative for visual changes, syncope, or dizziness All other systems reviewed and are otherwise negative except as noted above.    Blood pressure (!) 148/50, pulse 72, height 5\' 7"  (1.702 m), weight 176 lb (79.8 kg).  General appearance: alert and no distress Neck: no adenopathy, no carotid bruit, no JVD, supple, symmetrical, trachea midline and thyroid not enlarged, symmetric, no tenderness/mass/nodules Lungs: clear to auscultation bilaterally Heart: regular rate and rhythm, S1, S2 normal, no murmur, click, rub or gallop Extremities: extremities normal, atraumatic, no cyanosis or edema  EKG not performed today  ASSESSMENT AND PLAN:   Chronic diastolic heart failure (HCC) History of chronic diastolic heart failure last 2-D echo performed 04/14/14 revealing normal LV systolic function with grade 1 diastolic dysfunction. He is on amlodipine. He gets mild dyspnea otherwise is clinically  stable.  CAD - CFX DES 7/07, LAD DES 10/08, cath 11/11- medical Rx History of coronary artery disease status post LAD and circumflex stenting in the past with drug-eluting stents and subsequent negative Myoview's stress test performed 08/17/12. He denies chest pain.  Peripheral arterial disease: History of left carotid endarterectomy History of carotid artery disease status post left carotid endarterectomy past with Dopplers performed 10/02/13 revealing a widely patent endarterectomy site with mild bilateral ICA stenosis.  Essential hypertension History of hypertension blood pressure measured 140/50. He is on amlodipine. Continue current meds at current dosing  HLD (hyperlipidemia) History of hyperlipidemia on statin therapy followed by his PCP      Larry Harp MD [facility], [facility] 03/19/2016 4:26 PM

## 2016-03-19 NOTE — Assessment & Plan Note (Signed)
History of chronic diastolic heart failure last 2-D echo performed 04/14/14 revealing normal LV systolic function with grade 1 diastolic dysfunction. He is on amlodipine. He gets mild dyspnea otherwise is clinically stable.

## 2016-03-19 NOTE — Patient Instructions (Signed)
Medication Instructions:  NO CHANGES.   Follow-Up: Your physician wants you to follow-up in: 12 MONTHS WITH DR BERRY.  You will receive a reminder letter in the mail two months in advance. If you don't receive a letter, please call our office to schedule the follow-up appointment.   If you need a refill on your cardiac medications before your next appointment, please call your pharmacy.   

## 2016-03-19 NOTE — Assessment & Plan Note (Signed)
History of coronary artery disease status post LAD and circumflex stenting in the past with drug-eluting stents and subsequent negative Myoview's stress test performed 08/17/12. He denies chest pain.

## 2016-03-24 NOTE — Telephone Encounter (Signed)
Requested office notes, labs, and xray from PCP several times and not received  Patient has been seen in by Dr Gwenlyn Found and information requested again day of visit

## 2016-03-25 ENCOUNTER — Other Ambulatory Visit: Payer: Self-pay | Admitting: Cardiovascular Disease

## 2016-04-02 ENCOUNTER — Other Ambulatory Visit: Payer: Self-pay | Admitting: *Deleted

## 2016-04-02 MED ORDER — PANTOPRAZOLE SODIUM 40 MG PO TBEC: 40.0000 mg | DELAYED_RELEASE_TABLET | Freq: Every day | ORAL | 0 refills | Status: AC

## 2016-04-02 MED ORDER — SIMVASTATIN 10 MG PO TABS: 10.0000 mg | ORAL_TABLET | Freq: Every day | ORAL | 0 refills | Status: AC

## 2016-04-02 NOTE — Telephone Encounter (Signed)
Patient needs a short term supply until his mail order arrives.

## 2016-04-20 DIAGNOSIS — J069 Acute upper respiratory infection, unspecified: Secondary | ICD-10-CM | POA: Diagnosis not present

## 2016-05-18 DIAGNOSIS — M25552 Pain in left hip: Secondary | ICD-10-CM | POA: Diagnosis not present

## 2016-05-18 DIAGNOSIS — H8110 Benign paroxysmal vertigo, unspecified ear: Secondary | ICD-10-CM | POA: Diagnosis not present

## 2016-06-08 DIAGNOSIS — G8929 Other chronic pain: Secondary | ICD-10-CM | POA: Diagnosis not present

## 2016-06-08 DIAGNOSIS — M545 Low back pain: Secondary | ICD-10-CM | POA: Diagnosis not present

## 2016-07-19 DIAGNOSIS — G4733 Obstructive sleep apnea (adult) (pediatric): Secondary | ICD-10-CM | POA: Diagnosis not present

## 2016-07-19 DIAGNOSIS — R2681 Unsteadiness on feet: Secondary | ICD-10-CM | POA: Diagnosis not present

## 2016-07-26 DIAGNOSIS — G4733 Obstructive sleep apnea (adult) (pediatric): Secondary | ICD-10-CM | POA: Diagnosis not present

## 2016-07-26 DIAGNOSIS — K219 Gastro-esophageal reflux disease without esophagitis: Secondary | ICD-10-CM | POA: Diagnosis not present

## 2016-07-26 DIAGNOSIS — Z9181 History of falling: Secondary | ICD-10-CM | POA: Diagnosis not present

## 2016-07-26 DIAGNOSIS — M1990 Unspecified osteoarthritis, unspecified site: Secondary | ICD-10-CM | POA: Diagnosis not present

## 2016-07-26 DIAGNOSIS — E785 Hyperlipidemia, unspecified: Secondary | ICD-10-CM | POA: Diagnosis not present

## 2016-07-26 DIAGNOSIS — Z87891 Personal history of nicotine dependence: Secondary | ICD-10-CM | POA: Diagnosis not present

## 2016-07-26 DIAGNOSIS — Z7982 Long term (current) use of aspirin: Secondary | ICD-10-CM | POA: Diagnosis not present

## 2016-07-26 DIAGNOSIS — R2681 Unsteadiness on feet: Secondary | ICD-10-CM | POA: Diagnosis not present

## 2016-07-29 DIAGNOSIS — Z9181 History of falling: Secondary | ICD-10-CM | POA: Diagnosis not present

## 2016-07-29 DIAGNOSIS — E785 Hyperlipidemia, unspecified: Secondary | ICD-10-CM | POA: Diagnosis not present

## 2016-07-29 DIAGNOSIS — K219 Gastro-esophageal reflux disease without esophagitis: Secondary | ICD-10-CM | POA: Diagnosis not present

## 2016-07-29 DIAGNOSIS — Z87891 Personal history of nicotine dependence: Secondary | ICD-10-CM | POA: Diagnosis not present

## 2016-07-29 DIAGNOSIS — M1990 Unspecified osteoarthritis, unspecified site: Secondary | ICD-10-CM | POA: Diagnosis not present

## 2016-07-29 DIAGNOSIS — Z7982 Long term (current) use of aspirin: Secondary | ICD-10-CM | POA: Diagnosis not present

## 2016-07-29 DIAGNOSIS — R2681 Unsteadiness on feet: Secondary | ICD-10-CM | POA: Diagnosis not present

## 2016-07-29 DIAGNOSIS — G4733 Obstructive sleep apnea (adult) (pediatric): Secondary | ICD-10-CM | POA: Diagnosis not present

## 2016-07-30 DIAGNOSIS — Z7982 Long term (current) use of aspirin: Secondary | ICD-10-CM | POA: Diagnosis not present

## 2016-07-30 DIAGNOSIS — Z9181 History of falling: Secondary | ICD-10-CM | POA: Diagnosis not present

## 2016-07-30 DIAGNOSIS — M1990 Unspecified osteoarthritis, unspecified site: Secondary | ICD-10-CM | POA: Diagnosis not present

## 2016-07-30 DIAGNOSIS — G4733 Obstructive sleep apnea (adult) (pediatric): Secondary | ICD-10-CM | POA: Diagnosis not present

## 2016-07-30 DIAGNOSIS — E785 Hyperlipidemia, unspecified: Secondary | ICD-10-CM | POA: Diagnosis not present

## 2016-07-30 DIAGNOSIS — K219 Gastro-esophageal reflux disease without esophagitis: Secondary | ICD-10-CM | POA: Diagnosis not present

## 2016-07-30 DIAGNOSIS — Z87891 Personal history of nicotine dependence: Secondary | ICD-10-CM | POA: Diagnosis not present

## 2016-07-30 DIAGNOSIS — R2681 Unsteadiness on feet: Secondary | ICD-10-CM | POA: Diagnosis not present

## 2016-08-02 DIAGNOSIS — M1990 Unspecified osteoarthritis, unspecified site: Secondary | ICD-10-CM | POA: Diagnosis not present

## 2016-08-02 DIAGNOSIS — R2681 Unsteadiness on feet: Secondary | ICD-10-CM | POA: Diagnosis not present

## 2016-08-02 DIAGNOSIS — Z7982 Long term (current) use of aspirin: Secondary | ICD-10-CM | POA: Diagnosis not present

## 2016-08-02 DIAGNOSIS — E785 Hyperlipidemia, unspecified: Secondary | ICD-10-CM | POA: Diagnosis not present

## 2016-08-02 DIAGNOSIS — K219 Gastro-esophageal reflux disease without esophagitis: Secondary | ICD-10-CM | POA: Diagnosis not present

## 2016-08-02 DIAGNOSIS — Z87891 Personal history of nicotine dependence: Secondary | ICD-10-CM | POA: Diagnosis not present

## 2016-08-02 DIAGNOSIS — G4733 Obstructive sleep apnea (adult) (pediatric): Secondary | ICD-10-CM | POA: Diagnosis not present

## 2016-08-02 DIAGNOSIS — Z9181 History of falling: Secondary | ICD-10-CM | POA: Diagnosis not present

## 2016-08-03 DIAGNOSIS — Z87891 Personal history of nicotine dependence: Secondary | ICD-10-CM | POA: Diagnosis not present

## 2016-08-03 DIAGNOSIS — M1990 Unspecified osteoarthritis, unspecified site: Secondary | ICD-10-CM | POA: Diagnosis not present

## 2016-08-03 DIAGNOSIS — K219 Gastro-esophageal reflux disease without esophagitis: Secondary | ICD-10-CM | POA: Diagnosis not present

## 2016-08-03 DIAGNOSIS — R2681 Unsteadiness on feet: Secondary | ICD-10-CM | POA: Diagnosis not present

## 2016-08-03 DIAGNOSIS — G4733 Obstructive sleep apnea (adult) (pediatric): Secondary | ICD-10-CM | POA: Diagnosis not present

## 2016-08-03 DIAGNOSIS — E785 Hyperlipidemia, unspecified: Secondary | ICD-10-CM | POA: Diagnosis not present

## 2016-08-03 DIAGNOSIS — Z9181 History of falling: Secondary | ICD-10-CM | POA: Diagnosis not present

## 2016-08-03 DIAGNOSIS — Z7982 Long term (current) use of aspirin: Secondary | ICD-10-CM | POA: Diagnosis not present

## 2016-08-06 DIAGNOSIS — E785 Hyperlipidemia, unspecified: Secondary | ICD-10-CM | POA: Diagnosis not present

## 2016-08-06 DIAGNOSIS — I10 Essential (primary) hypertension: Secondary | ICD-10-CM | POA: Diagnosis not present

## 2016-08-06 DIAGNOSIS — K219 Gastro-esophageal reflux disease without esophagitis: Secondary | ICD-10-CM | POA: Diagnosis not present

## 2016-08-06 DIAGNOSIS — Z87891 Personal history of nicotine dependence: Secondary | ICD-10-CM | POA: Diagnosis not present

## 2016-08-06 DIAGNOSIS — Z7982 Long term (current) use of aspirin: Secondary | ICD-10-CM | POA: Diagnosis not present

## 2016-08-06 DIAGNOSIS — G4733 Obstructive sleep apnea (adult) (pediatric): Secondary | ICD-10-CM | POA: Diagnosis not present

## 2016-08-06 DIAGNOSIS — Z9181 History of falling: Secondary | ICD-10-CM | POA: Diagnosis not present

## 2016-08-06 DIAGNOSIS — R2681 Unsteadiness on feet: Secondary | ICD-10-CM | POA: Diagnosis not present

## 2016-08-06 DIAGNOSIS — M1990 Unspecified osteoarthritis, unspecified site: Secondary | ICD-10-CM | POA: Diagnosis not present

## 2016-08-06 DIAGNOSIS — I779 Disorder of arteries and arterioles, unspecified: Secondary | ICD-10-CM | POA: Diagnosis not present

## 2016-08-10 DIAGNOSIS — K219 Gastro-esophageal reflux disease without esophagitis: Secondary | ICD-10-CM | POA: Diagnosis not present

## 2016-08-10 DIAGNOSIS — R2681 Unsteadiness on feet: Secondary | ICD-10-CM | POA: Diagnosis not present

## 2016-08-10 DIAGNOSIS — M1990 Unspecified osteoarthritis, unspecified site: Secondary | ICD-10-CM | POA: Diagnosis not present

## 2016-08-10 DIAGNOSIS — Z87891 Personal history of nicotine dependence: Secondary | ICD-10-CM | POA: Diagnosis not present

## 2016-08-10 DIAGNOSIS — Z7982 Long term (current) use of aspirin: Secondary | ICD-10-CM | POA: Diagnosis not present

## 2016-08-10 DIAGNOSIS — E785 Hyperlipidemia, unspecified: Secondary | ICD-10-CM | POA: Diagnosis not present

## 2016-08-10 DIAGNOSIS — Z9181 History of falling: Secondary | ICD-10-CM | POA: Diagnosis not present

## 2016-08-10 DIAGNOSIS — G4733 Obstructive sleep apnea (adult) (pediatric): Secondary | ICD-10-CM | POA: Diagnosis not present

## 2016-08-12 DIAGNOSIS — Z7982 Long term (current) use of aspirin: Secondary | ICD-10-CM | POA: Diagnosis not present

## 2016-08-12 DIAGNOSIS — Z87891 Personal history of nicotine dependence: Secondary | ICD-10-CM | POA: Diagnosis not present

## 2016-08-12 DIAGNOSIS — K219 Gastro-esophageal reflux disease without esophagitis: Secondary | ICD-10-CM | POA: Diagnosis not present

## 2016-08-12 DIAGNOSIS — M1990 Unspecified osteoarthritis, unspecified site: Secondary | ICD-10-CM | POA: Diagnosis not present

## 2016-08-12 DIAGNOSIS — R2681 Unsteadiness on feet: Secondary | ICD-10-CM | POA: Diagnosis not present

## 2016-08-12 DIAGNOSIS — G4733 Obstructive sleep apnea (adult) (pediatric): Secondary | ICD-10-CM | POA: Diagnosis not present

## 2016-08-12 DIAGNOSIS — E785 Hyperlipidemia, unspecified: Secondary | ICD-10-CM | POA: Diagnosis not present

## 2016-08-12 DIAGNOSIS — Z9181 History of falling: Secondary | ICD-10-CM | POA: Diagnosis not present

## 2016-08-13 DIAGNOSIS — R2681 Unsteadiness on feet: Secondary | ICD-10-CM | POA: Diagnosis not present

## 2016-08-13 DIAGNOSIS — K219 Gastro-esophageal reflux disease without esophagitis: Secondary | ICD-10-CM | POA: Diagnosis not present

## 2016-08-13 DIAGNOSIS — Z9181 History of falling: Secondary | ICD-10-CM | POA: Diagnosis not present

## 2016-08-13 DIAGNOSIS — Z7982 Long term (current) use of aspirin: Secondary | ICD-10-CM | POA: Diagnosis not present

## 2016-08-13 DIAGNOSIS — M1990 Unspecified osteoarthritis, unspecified site: Secondary | ICD-10-CM | POA: Diagnosis not present

## 2016-08-13 DIAGNOSIS — Z87891 Personal history of nicotine dependence: Secondary | ICD-10-CM | POA: Diagnosis not present

## 2016-08-13 DIAGNOSIS — G4733 Obstructive sleep apnea (adult) (pediatric): Secondary | ICD-10-CM | POA: Diagnosis not present

## 2016-08-13 DIAGNOSIS — E785 Hyperlipidemia, unspecified: Secondary | ICD-10-CM | POA: Diagnosis not present

## 2016-08-17 DIAGNOSIS — R2681 Unsteadiness on feet: Secondary | ICD-10-CM | POA: Diagnosis not present

## 2016-08-17 DIAGNOSIS — Z87891 Personal history of nicotine dependence: Secondary | ICD-10-CM | POA: Diagnosis not present

## 2016-08-17 DIAGNOSIS — Z9181 History of falling: Secondary | ICD-10-CM | POA: Diagnosis not present

## 2016-08-17 DIAGNOSIS — Z7982 Long term (current) use of aspirin: Secondary | ICD-10-CM | POA: Diagnosis not present

## 2016-08-17 DIAGNOSIS — K219 Gastro-esophageal reflux disease without esophagitis: Secondary | ICD-10-CM | POA: Diagnosis not present

## 2016-08-17 DIAGNOSIS — E785 Hyperlipidemia, unspecified: Secondary | ICD-10-CM | POA: Diagnosis not present

## 2016-08-17 DIAGNOSIS — M1990 Unspecified osteoarthritis, unspecified site: Secondary | ICD-10-CM | POA: Diagnosis not present

## 2016-08-17 DIAGNOSIS — G4733 Obstructive sleep apnea (adult) (pediatric): Secondary | ICD-10-CM | POA: Diagnosis not present

## 2016-08-18 DIAGNOSIS — M1990 Unspecified osteoarthritis, unspecified site: Secondary | ICD-10-CM | POA: Diagnosis not present

## 2016-08-18 DIAGNOSIS — Z7982 Long term (current) use of aspirin: Secondary | ICD-10-CM | POA: Diagnosis not present

## 2016-08-18 DIAGNOSIS — Z9181 History of falling: Secondary | ICD-10-CM | POA: Diagnosis not present

## 2016-08-18 DIAGNOSIS — R2681 Unsteadiness on feet: Secondary | ICD-10-CM | POA: Diagnosis not present

## 2016-08-18 DIAGNOSIS — K219 Gastro-esophageal reflux disease without esophagitis: Secondary | ICD-10-CM | POA: Diagnosis not present

## 2016-08-18 DIAGNOSIS — Z87891 Personal history of nicotine dependence: Secondary | ICD-10-CM | POA: Diagnosis not present

## 2016-08-18 DIAGNOSIS — E785 Hyperlipidemia, unspecified: Secondary | ICD-10-CM | POA: Diagnosis not present

## 2016-08-18 DIAGNOSIS — G4733 Obstructive sleep apnea (adult) (pediatric): Secondary | ICD-10-CM | POA: Diagnosis not present

## 2016-08-20 DIAGNOSIS — Z7982 Long term (current) use of aspirin: Secondary | ICD-10-CM | POA: Diagnosis not present

## 2016-08-20 DIAGNOSIS — K219 Gastro-esophageal reflux disease without esophagitis: Secondary | ICD-10-CM | POA: Diagnosis not present

## 2016-08-20 DIAGNOSIS — E785 Hyperlipidemia, unspecified: Secondary | ICD-10-CM | POA: Diagnosis not present

## 2016-08-20 DIAGNOSIS — G4733 Obstructive sleep apnea (adult) (pediatric): Secondary | ICD-10-CM | POA: Diagnosis not present

## 2016-08-20 DIAGNOSIS — R2681 Unsteadiness on feet: Secondary | ICD-10-CM | POA: Diagnosis not present

## 2016-08-20 DIAGNOSIS — Z9181 History of falling: Secondary | ICD-10-CM | POA: Diagnosis not present

## 2016-08-20 DIAGNOSIS — Z87891 Personal history of nicotine dependence: Secondary | ICD-10-CM | POA: Diagnosis not present

## 2016-08-20 DIAGNOSIS — M1990 Unspecified osteoarthritis, unspecified site: Secondary | ICD-10-CM | POA: Diagnosis not present

## 2016-08-26 DIAGNOSIS — G4733 Obstructive sleep apnea (adult) (pediatric): Secondary | ICD-10-CM | POA: Diagnosis not present

## 2016-09-24 DIAGNOSIS — G4733 Obstructive sleep apnea (adult) (pediatric): Secondary | ICD-10-CM | POA: Diagnosis not present

## 2016-09-29 DIAGNOSIS — J040 Acute laryngitis: Secondary | ICD-10-CM | POA: Diagnosis not present

## 2016-10-01 DIAGNOSIS — A088 Other specified intestinal infections: Secondary | ICD-10-CM | POA: Diagnosis not present

## 2016-10-07 DIAGNOSIS — N393 Stress incontinence (female) (male): Secondary | ICD-10-CM | POA: Diagnosis not present

## 2016-10-07 DIAGNOSIS — N3642 Intrinsic sphincter deficiency (ISD): Secondary | ICD-10-CM | POA: Diagnosis not present

## 2016-10-07 DIAGNOSIS — N5234 Erectile dysfunction following simple prostatectomy: Secondary | ICD-10-CM | POA: Diagnosis not present

## 2016-11-08 DIAGNOSIS — R269 Unspecified abnormalities of gait and mobility: Secondary | ICD-10-CM | POA: Diagnosis not present

## 2016-11-08 DIAGNOSIS — G4733 Obstructive sleep apnea (adult) (pediatric): Secondary | ICD-10-CM | POA: Diagnosis not present

## 2016-11-18 DIAGNOSIS — H353130 Nonexudative age-related macular degeneration, bilateral, stage unspecified: Secondary | ICD-10-CM | POA: Diagnosis not present

## 2016-11-18 DIAGNOSIS — H532 Diplopia: Secondary | ICD-10-CM | POA: Diagnosis not present

## 2016-11-18 DIAGNOSIS — Z961 Presence of intraocular lens: Secondary | ICD-10-CM | POA: Diagnosis not present

## 2016-11-18 DIAGNOSIS — H353131 Nonexudative age-related macular degeneration, bilateral, early dry stage: Secondary | ICD-10-CM | POA: Diagnosis not present

## 2016-11-18 DIAGNOSIS — H35032 Hypertensive retinopathy, left eye: Secondary | ICD-10-CM | POA: Diagnosis not present

## 2016-11-28 IMAGING — CR DG SHOULDER 2+V*L*
6 series · 6 of 6 positions shown · non-contrast
Comparison: Chest radiographs 08/20/2015.

CLINICAL DATA: Left shoulder pain after falling today. Initial
encounter.

EXAM:
LEFT SHOULDER - 2+ VIEW

[shoulder grashey (1 of 2)]
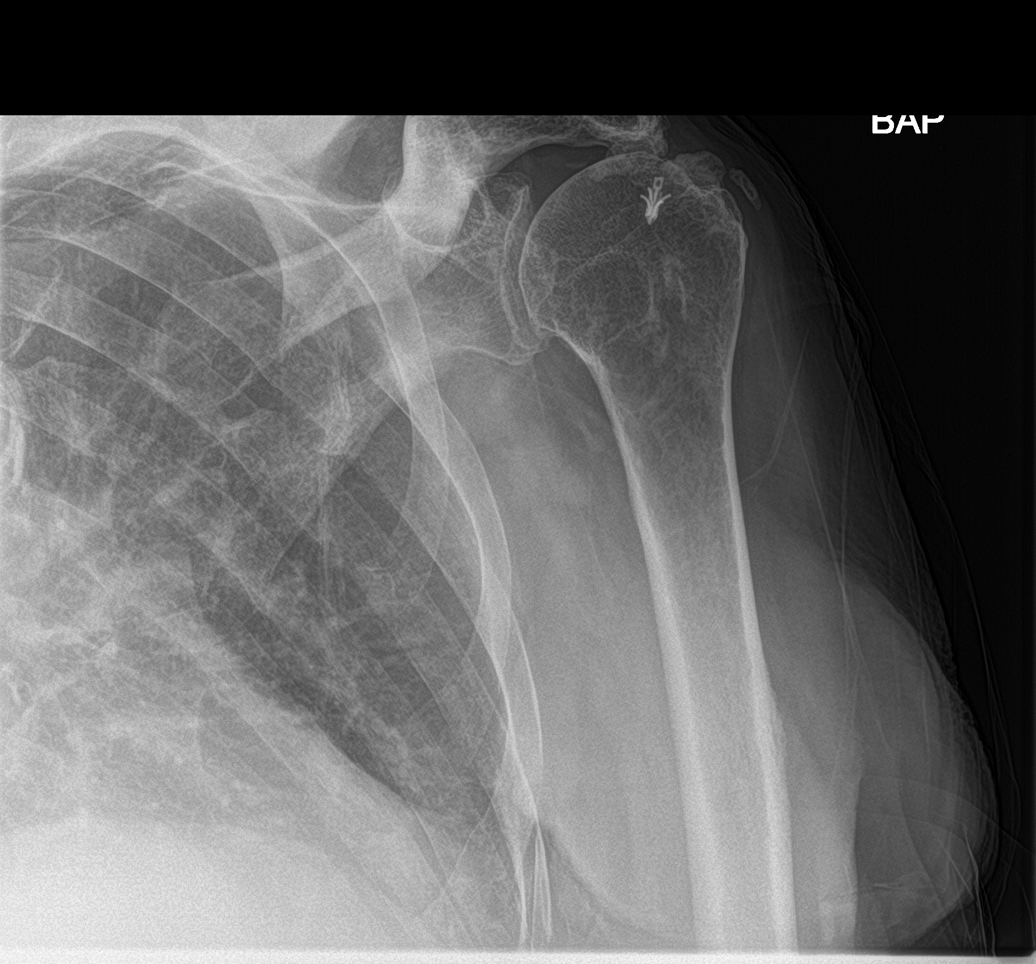

[shoulder y view (1 of 2)]
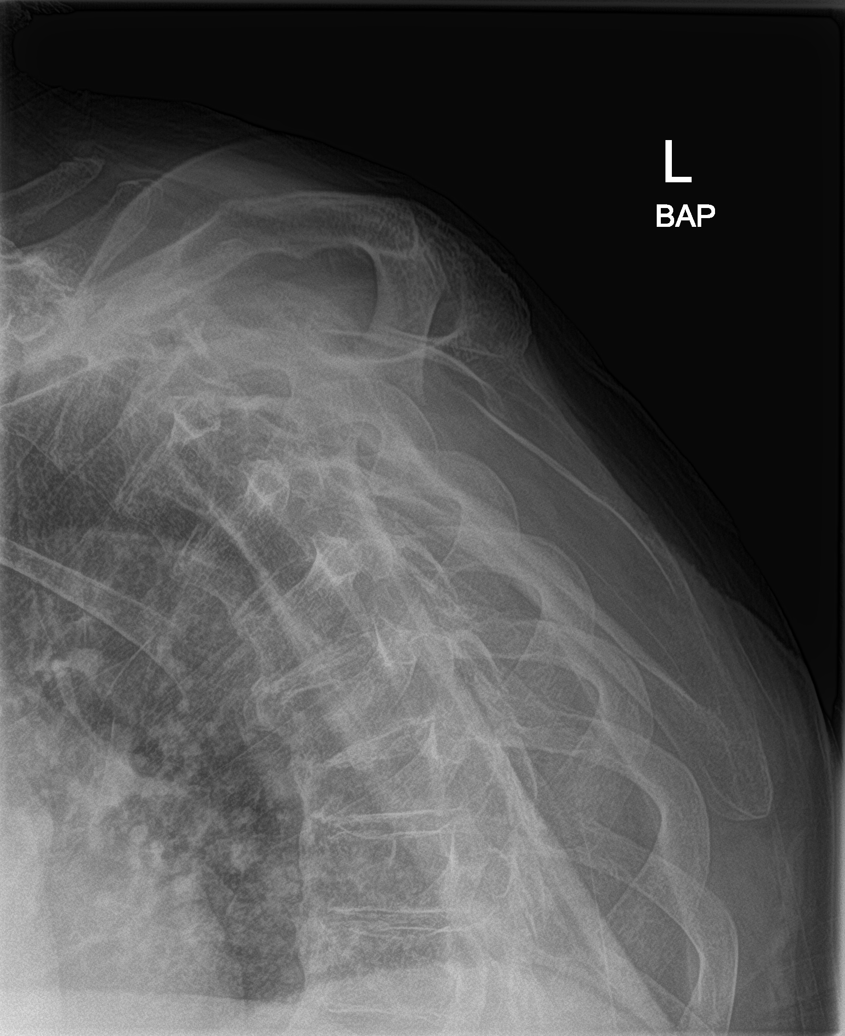

[shoulder axillary (1 of 2)]
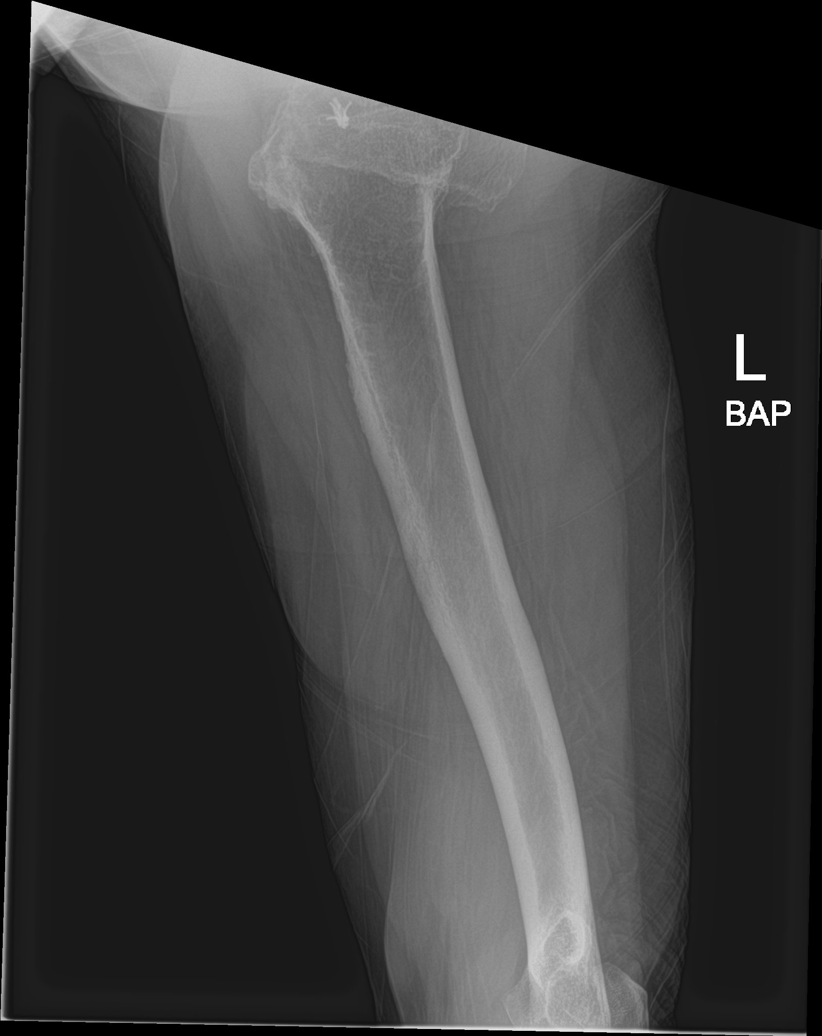

[shoulder grashey (2 of 2)]
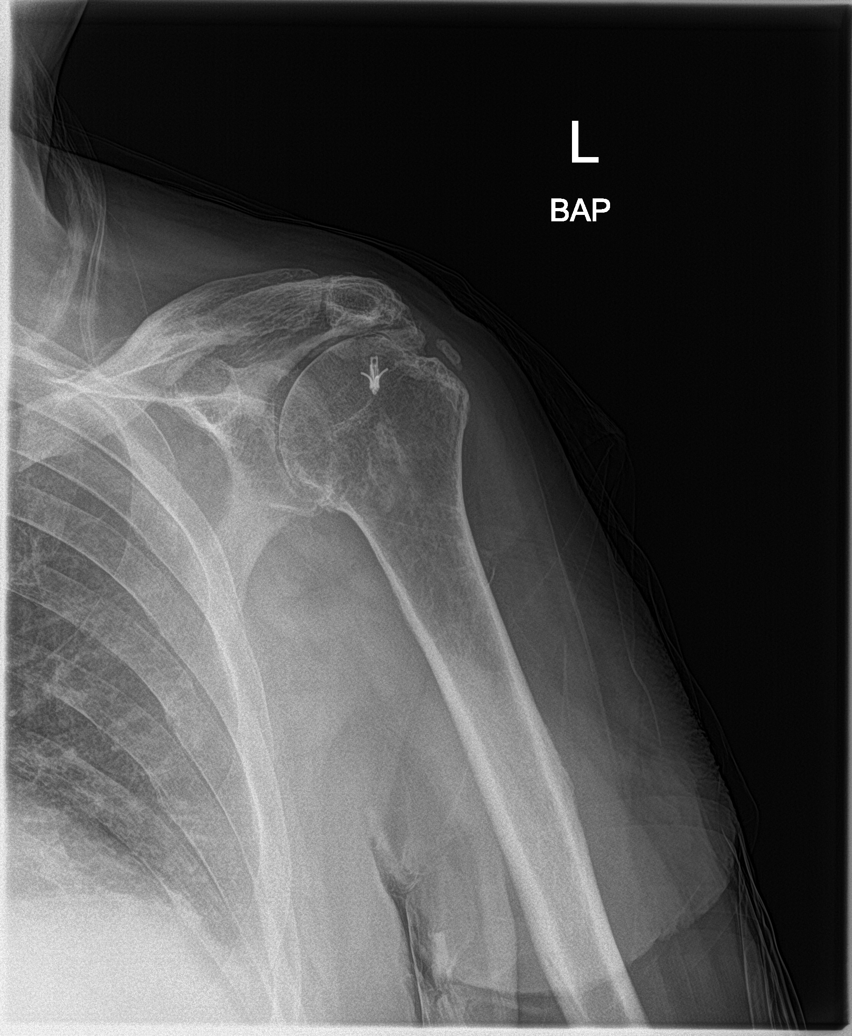

[shoulder y view (2 of 2)]
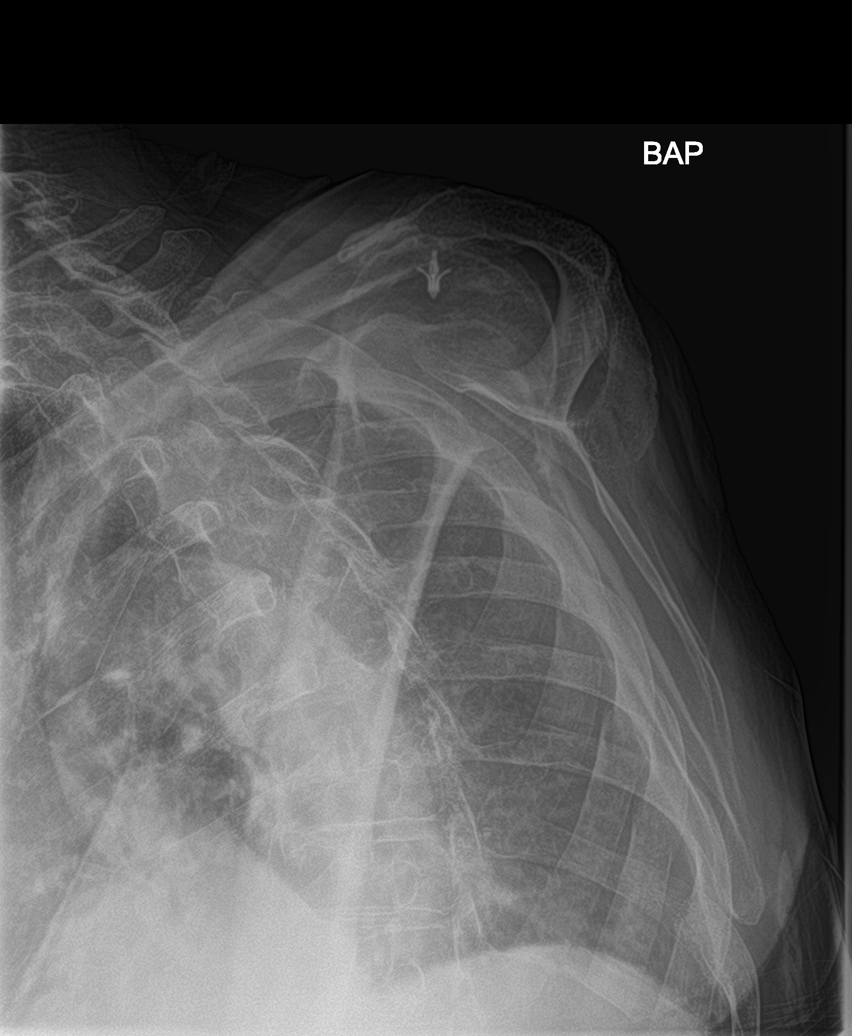

[shoulder axillary (2 of 2)]
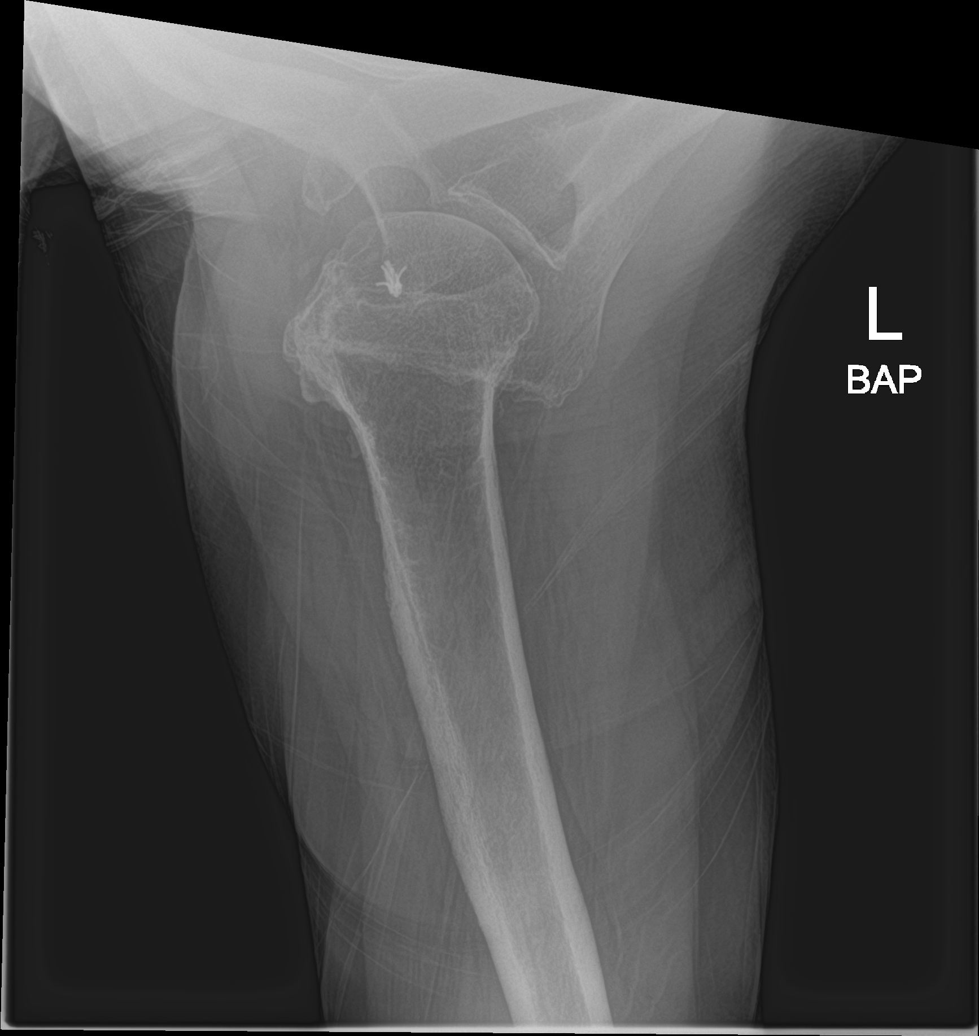

[6 of 6 positions shown; findings below may reference images not displayed]

FINDINGS: The bones are demineralized. There is no evidence of acute fracture
or dislocation. There are postsurgical changes in the humeral head
with fragmented osteophytes and narrowing of the subacromial space.
Glenohumeral and acromioclavicular degenerative changes are present.
There are multiple old left-sided rib fractures.
IMPRESSION: No acute findings. Degenerative, postsurgical and posttraumatic
findings as described.

## 2016-12-02 DIAGNOSIS — I872 Venous insufficiency (chronic) (peripheral): Secondary | ICD-10-CM | POA: Diagnosis not present

## 2016-12-02 DIAGNOSIS — L821 Other seborrheic keratosis: Secondary | ICD-10-CM | POA: Diagnosis not present

## 2016-12-02 DIAGNOSIS — L308 Other specified dermatitis: Secondary | ICD-10-CM | POA: Diagnosis not present

## 2016-12-08 DIAGNOSIS — G4733 Obstructive sleep apnea (adult) (pediatric): Secondary | ICD-10-CM | POA: Diagnosis not present

## 2016-12-08 DIAGNOSIS — R269 Unspecified abnormalities of gait and mobility: Secondary | ICD-10-CM | POA: Diagnosis not present

## 2017-01-08 DIAGNOSIS — G4733 Obstructive sleep apnea (adult) (pediatric): Secondary | ICD-10-CM | POA: Diagnosis not present

## 2017-01-08 DIAGNOSIS — R269 Unspecified abnormalities of gait and mobility: Secondary | ICD-10-CM | POA: Diagnosis not present

## 2017-02-03 DIAGNOSIS — Z23 Encounter for immunization: Secondary | ICD-10-CM | POA: Diagnosis not present

## 2017-02-03 DIAGNOSIS — I10 Essential (primary) hypertension: Secondary | ICD-10-CM | POA: Diagnosis not present

## 2017-02-03 DIAGNOSIS — E785 Hyperlipidemia, unspecified: Secondary | ICD-10-CM | POA: Diagnosis not present

## 2017-02-03 DIAGNOSIS — Z0000 Encounter for general adult medical examination without abnormal findings: Secondary | ICD-10-CM | POA: Diagnosis not present

## 2017-02-03 DIAGNOSIS — D649 Anemia, unspecified: Secondary | ICD-10-CM | POA: Diagnosis not present

## 2017-02-03 DIAGNOSIS — M79604 Pain in right leg: Secondary | ICD-10-CM | POA: Diagnosis not present

## 2017-02-03 DIAGNOSIS — G4733 Obstructive sleep apnea (adult) (pediatric): Secondary | ICD-10-CM | POA: Diagnosis not present

## 2017-02-03 DIAGNOSIS — I779 Disorder of arteries and arterioles, unspecified: Secondary | ICD-10-CM | POA: Diagnosis not present

## 2017-02-03 DIAGNOSIS — K219 Gastro-esophageal reflux disease without esophagitis: Secondary | ICD-10-CM | POA: Diagnosis not present

## 2017-02-07 DIAGNOSIS — R269 Unspecified abnormalities of gait and mobility: Secondary | ICD-10-CM | POA: Diagnosis not present

## 2017-02-07 DIAGNOSIS — G4733 Obstructive sleep apnea (adult) (pediatric): Secondary | ICD-10-CM | POA: Diagnosis not present

## 2017-02-22 DIAGNOSIS — G4733 Obstructive sleep apnea (adult) (pediatric): Secondary | ICD-10-CM | POA: Diagnosis not present

## 2017-03-10 DIAGNOSIS — R269 Unspecified abnormalities of gait and mobility: Secondary | ICD-10-CM | POA: Diagnosis not present

## 2017-03-10 DIAGNOSIS — G4733 Obstructive sleep apnea (adult) (pediatric): Secondary | ICD-10-CM | POA: Diagnosis not present

## 2017-04-10 DIAGNOSIS — R269 Unspecified abnormalities of gait and mobility: Secondary | ICD-10-CM | POA: Diagnosis not present

## 2017-04-10 DIAGNOSIS — G4733 Obstructive sleep apnea (adult) (pediatric): Secondary | ICD-10-CM | POA: Diagnosis not present

## 2017-05-10 ENCOUNTER — Encounter: Payer: Self-pay | Admitting: Cardiovascular Disease

## 2017-05-10 ENCOUNTER — Ambulatory Visit (INDEPENDENT_AMBULATORY_CARE_PROVIDER_SITE_OTHER): Payer: Medicare HMO | Admitting: Cardiovascular Disease

## 2017-05-10 DIAGNOSIS — I5032 Chronic diastolic (congestive) heart failure: Secondary | ICD-10-CM | POA: Diagnosis not present

## 2017-05-10 DIAGNOSIS — I10 Essential (primary) hypertension: Secondary | ICD-10-CM | POA: Diagnosis not present

## 2017-05-10 DIAGNOSIS — R269 Unspecified abnormalities of gait and mobility: Secondary | ICD-10-CM | POA: Diagnosis not present

## 2017-05-10 DIAGNOSIS — I739 Peripheral vascular disease, unspecified: Secondary | ICD-10-CM | POA: Diagnosis not present

## 2017-05-10 DIAGNOSIS — E7800 Pure hypercholesterolemia, unspecified: Secondary | ICD-10-CM | POA: Diagnosis not present

## 2017-05-10 DIAGNOSIS — I2510 Atherosclerotic heart disease of native coronary artery without angina pectoris: Secondary | ICD-10-CM

## 2017-05-10 DIAGNOSIS — G4733 Obstructive sleep apnea (adult) (pediatric): Secondary | ICD-10-CM | POA: Diagnosis not present

## 2017-05-10 NOTE — Assessment & Plan Note (Signed)
History of CAD status post LAD and circumflex stenting in the past with drug-eluting stents assessment negative Myoview stress test 08/17/12. His last Myoview performed 07/31/14 revealed scarring the circumflex [place] without evidence of ischemia. He gets occasional chest pain which she attributes to reflux.

## 2017-05-10 NOTE — Addendum Note (Signed)
Addended by: Zebedee Iba on: 05/10/2017 04:19 PM   Modules accepted: Orders

## 2017-05-10 NOTE — Progress Notes (Signed)
05/10/2017 Larry Huffman   [birth year]  [id number]  Primary Physician Michel Harrow, PA-C Primary Cardiologist: Lorretta Harp MD Lupe Carney, [place]  HPI:  Larry Huffman is a 83 y.o. male mildly overweight, married Caucasian male, father of 22, grandfather to 8 grandchildren who I saw in the office 03/19/16. He is accompanied by his caregiver. He has a history of CAD status post LAD and circumflex stenting in the past with drug-eluting stents and subsequent negative Myoview as recently as August 17, 2012. He has had left carotid endarterectomy remotely as well which we follow by duplex ultrasound. This was most recently done this past March of last year and was widely patent. He has normal lower extremity arterial Dopplers and venous Dopplers suggesting venous insufficiency. He has obstructive sleep apnea on CPAP followed by Dr. Claiborne Billings. He saw Dr. Mali Hilty who thought he was a good candidate for endovenous ablation. He had admission in October of last year for acute pulmonary embolism and has been on Coumadin at coagulation. He really needs a urologic procedure and had a pharmacologic Myoview stress test performed in January of this year which was low with a nonischemic. Since I saw him back he has fallen and injured his head with excessive bleeding. He also developed shingles. His Coumadin has since been discontinued. He denies chest pain or shortness of breath. He has not had any falling episodes since I saw him last. He does complain of some chest pain which he thinks feels like reflux.  Current Meds  Medication Sig  . acetaminophen (TYLENOL) 500 MG tablet Take 1,000 mg by mouth 2 (two) times daily as needed for mild pain.   Marland Kitchen amLODipine (NORVASC) 10 MG tablet Take 10 mg by mouth daily.  . Ascorbic Acid (VITAMIN C) 1000 MG tablet Take 1,000 mg by mouth daily.  Marland Kitchen aspirin EC 81 MG tablet Take 81 mg by mouth at bedtime.   . clobetasol (TEMOVATE) 0.05 % external solution Apply 1  application topically See admin instructions. 1 application every morning, and 1 application twice daily during break outs  . clopidogrel (PLAVIX) 75 MG tablet Take 1 tablet (75 mg total) by mouth daily.  . ferrous sulfate 325 (65 FE) MG tablet Take 325 mg by mouth daily with breakfast.  . hydrOXYzine (VISTARIL) 25 MG capsule Take 25 mg by mouth every morning.   . loratadine (CLARITIN) 10 MG tablet Take 5 mg by mouth daily.  . montelukast (SINGULAIR) 10 MG tablet Take 10 mg by mouth at bedtime.  . Multiple Vitamin (MULTIVITAMIN WITH MINERALS) TABS Take 1 tablet by mouth daily. [employer] brand multivitamin  . Multiple Vitamins-Minerals (OCUVITE ADULT 50+ PO) Take 1 tablet by mouth daily.   . mupirocin ointment (BACTROBAN) 2 % Apply 1 application topically at bedtime.   . nitroGLYCERIN (NITROSTAT) 0.4 MG SL tablet Place 1 tablet (0.4 mg total) under the tongue every 5 (five) minutes as needed for chest pain.  . Omega-3 Fatty Acids (FISH OIL) 1200 MG CAPS Take 1,200 mg by mouth daily.   Marland Kitchen OVER THE COUNTER MEDICATION Place 1 drop into both eyes daily as needed (itching). Over the counter allergy eye drops  . pantoprazole (PROTONIX) 40 MG tablet Take 1 tablet (40 mg total) by mouth daily.  . polyethylene glycol (MIRALAX / GLYCOLAX) packet Take 17 g by mouth every morning.   . pyridOXINE (VITAMIN B-6) 50 MG tablet Take 50 mg by mouth daily.  . simvastatin (ZOCOR) 10  MG tablet Take 1 tablet (10 mg total) by mouth at bedtime.     Allergies  Allergen Reactions  . Ciprofloxacin Rash  . Tape Other (See Comments)    Pulls skin off.  Please use "paper tape" only.   . Dapsone Other (See Comments)    Lowers WBC count?? "made me very sick"    Social History   Social History  . Marital status: Widowed    Spouse name: N/A  . Number of children: N/A  . Years of education: N/A   Occupational History  . Not on file.   Social History Main Topics  . Smoking status: Former Smoker    Types: Cigarettes     Quit date: 07/12/1978  . Smokeless tobacco: Never Used  . Alcohol use No  . Drug use: No  . Sexual activity: Not on file   Other Topics Concern  . Not on file   Social History Narrative  . No narrative on file     Review of Systems: General: negative for chills, fever, night sweats or weight changes.  Cardiovascular: negative for chest pain, dyspnea on exertion, edema, orthopnea, palpitations, paroxysmal nocturnal dyspnea or shortness of breath Dermatological: negative for rash Respiratory: negative for cough or wheezing Urologic: negative for hematuria Abdominal: negative for nausea, vomiting, diarrhea, bright red blood per rectum, melena, or hematemesis Neurologic: negative for visual changes, syncope, or dizziness All other systems reviewed and are otherwise negative except as noted above.    Blood pressure (!) 160/56, pulse (!) 58, height 5\' 7"  (1.702 m), weight 174 lb (78.9 kg).  General appearance: alert and no distress Neck: no adenopathy, no JVD, supple, symmetrical, trachea midline and thyroid not enlarged, symmetric, no tenderness/mass/nodules Lungs: clear to auscultation bilaterally Heart: regular rate and rhythm, S1, S2 normal, no murmur, click, rub or gallop Extremities: extremities normal, atraumatic, no cyanosis or edema Pulses: 2+ and symmetric Skin: Skin color, texture, turgor normal. No rashes or lesions Neurologic: Alert and oriented X 3, normal strength and tone. Normal symmetric reflexes. Normal coordination and gait  EKG sinus bradycardia 58 with LVH voltage. I personally reviewed this EKG.  ASSESSMENT AND PLAN:   Chronic diastolic heart failure [facility]) Mr. Ackers has chronic diastolic heart failure with 2-D echo performed 04/14/14 revealing normal LV systolic function with grade 1 diastolic dysfunction. He gets mild dyspnea on exertion which has not changed in frequency or severity. He is not on a diuretic.  CAD - CFX DES 7/07, LAD DES 10/08, cath 11/11-  medical Rx History of CAD status post LAD and circumflex stenting in the past with drug-eluting stents assessment negative Myoview stress test 08/17/12. His last Myoview performed 07/31/14 revealed scarring the circumflex [place] without evidence of ischemia. He gets occasional chest pain which she attributes to reflux.  Peripheral arterial disease: History of left carotid endarterectomy History of carotid artery disease status post left carotid endarterectomy remotely which we have followed in the past by duplex ultrasound. His most recent carotid Doppler performed 10/02/13 revealed moderate bilateral ICA stenosis. He does have bilateral carotid bruits. At this point, given his age we have stopped doing carotid Doppler studies.  Essential hypertension History of essential hypertension blood pressure measured 160/56. He is on amlodipine. Continue current meds at current dosing  HLD (hyperlipidemia) History of hyperlipidemia on statin therapy followed by his PCP  Pulmonary embolism History of remote pulmonary embolism on Coumadin anticoagulation which was discontinued because of fall risk.      Lorretta Harp MD  FACP,FACC,FAHA, FSCAI 05/10/2017 4:10 PM

## 2017-05-10 NOTE — Patient Instructions (Signed)

## 2017-05-10 NOTE — Assessment & Plan Note (Signed)
History of essential hypertension blood pressure measured 160/56. He is on amlodipine. Continue current meds at current dosing

## 2017-05-10 NOTE — Assessment & Plan Note (Signed)
History of remote pulmonary embolism on Coumadin anticoagulation which was discontinued because of fall risk.

## 2017-05-10 NOTE — Assessment & Plan Note (Signed)
Larry Huffman has chronic diastolic heart failure with 2-D echo performed 04/14/14 revealing normal LV systolic function with grade 1 diastolic dysfunction. He gets mild dyspnea on exertion which has not changed in frequency or severity. He is not on a diuretic.

## 2017-05-10 NOTE — Assessment & Plan Note (Signed)
History of hyperlipidemia on statin therapy followed by his PCP 

## 2017-05-10 NOTE — Assessment & Plan Note (Signed)
History of carotid artery disease status post left carotid endarterectomy remotely which we have followed in the past by duplex ultrasound. His most recent carotid Doppler performed 10/02/13 revealed moderate bilateral ICA stenosis. He does have bilateral carotid bruits. At this point, given his age we have stopped doing carotid Doppler studies.

## 2017-06-10 DIAGNOSIS — G4733 Obstructive sleep apnea (adult) (pediatric): Secondary | ICD-10-CM | POA: Diagnosis not present

## 2017-06-10 DIAGNOSIS — R269 Unspecified abnormalities of gait and mobility: Secondary | ICD-10-CM | POA: Diagnosis not present

## 2017-07-10 DIAGNOSIS — G4733 Obstructive sleep apnea (adult) (pediatric): Secondary | ICD-10-CM | POA: Diagnosis not present

## 2017-07-10 DIAGNOSIS — R269 Unspecified abnormalities of gait and mobility: Secondary | ICD-10-CM | POA: Diagnosis not present

## 2017-08-03 DIAGNOSIS — N183 Chronic kidney disease, stage 3 (moderate): Secondary | ICD-10-CM | POA: Diagnosis not present

## 2017-08-03 DIAGNOSIS — R2681 Unsteadiness on feet: Secondary | ICD-10-CM | POA: Diagnosis not present

## 2017-08-03 DIAGNOSIS — I779 Disorder of arteries and arterioles, unspecified: Secondary | ICD-10-CM | POA: Diagnosis not present

## 2017-08-03 DIAGNOSIS — E785 Hyperlipidemia, unspecified: Secondary | ICD-10-CM | POA: Diagnosis not present

## 2017-08-03 DIAGNOSIS — G4733 Obstructive sleep apnea (adult) (pediatric): Secondary | ICD-10-CM | POA: Diagnosis not present

## 2017-08-03 DIAGNOSIS — K219 Gastro-esophageal reflux disease without esophagitis: Secondary | ICD-10-CM | POA: Diagnosis not present

## 2017-08-03 DIAGNOSIS — Z23 Encounter for immunization: Secondary | ICD-10-CM | POA: Diagnosis not present

## 2017-08-03 DIAGNOSIS — L309 Dermatitis, unspecified: Secondary | ICD-10-CM | POA: Diagnosis not present

## 2017-08-03 DIAGNOSIS — I10 Essential (primary) hypertension: Secondary | ICD-10-CM | POA: Diagnosis not present

## 2017-08-03 DIAGNOSIS — R7301 Impaired fasting glucose: Secondary | ICD-10-CM | POA: Diagnosis not present

## 2017-08-10 DIAGNOSIS — R269 Unspecified abnormalities of gait and mobility: Secondary | ICD-10-CM | POA: Diagnosis not present

## 2017-08-10 DIAGNOSIS — G4733 Obstructive sleep apnea (adult) (pediatric): Secondary | ICD-10-CM | POA: Diagnosis not present

## 2017-08-24 DIAGNOSIS — L600 Ingrowing nail: Secondary | ICD-10-CM | POA: Diagnosis not present

## 2017-09-07 DIAGNOSIS — L600 Ingrowing nail: Secondary | ICD-10-CM | POA: Diagnosis not present

## 2017-09-08 DIAGNOSIS — R269 Unspecified abnormalities of gait and mobility: Secondary | ICD-10-CM | POA: Diagnosis not present

## 2017-09-08 DIAGNOSIS — G4733 Obstructive sleep apnea (adult) (pediatric): Secondary | ICD-10-CM | POA: Diagnosis not present

## 2017-10-07 DIAGNOSIS — N393 Stress incontinence (female) (male): Secondary | ICD-10-CM | POA: Diagnosis not present

## 2017-10-07 DIAGNOSIS — N529 Male erectile dysfunction, unspecified: Secondary | ICD-10-CM | POA: Diagnosis not present

## 2017-10-14 ENCOUNTER — Other Ambulatory Visit: Payer: Self-pay

## 2017-10-14 ENCOUNTER — Emergency Department (HOSPITAL_COMMUNITY)
Admission: EM | Admit: 2017-10-14 | Discharge: 2017-10-15 | Disposition: A | Discharge status: Home / Self Care | Payer: Medicare HMO | Attending: Emergency Medicine | Admitting: Emergency Medicine

## 2017-10-14 ENCOUNTER — Emergency Department (HOSPITAL_COMMUNITY): Payer: Medicare HMO

## 2017-10-14 ENCOUNTER — Encounter (HOSPITAL_COMMUNITY): Payer: Self-pay | Admitting: Emergency Medicine

## 2017-10-14 DIAGNOSIS — N183 Chronic kidney disease, stage 3 (moderate): Secondary | ICD-10-CM | POA: Diagnosis not present

## 2017-10-14 DIAGNOSIS — Y999 Unspecified external cause status: Secondary | ICD-10-CM | POA: Insufficient documentation

## 2017-10-14 DIAGNOSIS — S29001A Unspecified injury of muscle and tendon of front wall of thorax, initial encounter: Secondary | ICD-10-CM | POA: Insufficient documentation

## 2017-10-14 DIAGNOSIS — I5032 Chronic diastolic (congestive) heart failure: Secondary | ICD-10-CM | POA: Insufficient documentation

## 2017-10-14 DIAGNOSIS — S62643A Nondisplaced fracture of proximal phalanx of left middle finger, initial encounter for closed fracture: Secondary | ICD-10-CM | POA: Insufficient documentation

## 2017-10-14 DIAGNOSIS — S298XXA Other specified injuries of thorax, initial encounter: Secondary | ICD-10-CM

## 2017-10-14 DIAGNOSIS — I130 Hypertensive heart and chronic kidney disease with heart failure and stage 1 through stage 4 chronic kidney disease, or unspecified chronic kidney disease: Secondary | ICD-10-CM | POA: Diagnosis not present

## 2017-10-14 DIAGNOSIS — M545 Low back pain: Secondary | ICD-10-CM | POA: Diagnosis not present

## 2017-10-14 DIAGNOSIS — W01198A Fall on same level from slipping, tripping and stumbling with subsequent striking against other object, initial encounter: Secondary | ICD-10-CM | POA: Insufficient documentation

## 2017-10-14 DIAGNOSIS — Z7902 Long term (current) use of antithrombotics/antiplatelets: Secondary | ICD-10-CM | POA: Diagnosis not present

## 2017-10-14 DIAGNOSIS — S0990XA Unspecified injury of head, initial encounter: Secondary | ICD-10-CM

## 2017-10-14 DIAGNOSIS — Z7982 Long term (current) use of aspirin: Secondary | ICD-10-CM | POA: Insufficient documentation

## 2017-10-14 DIAGNOSIS — S62641A Nondisplaced fracture of proximal phalanx of left index finger, initial encounter for closed fracture: Secondary | ICD-10-CM | POA: Diagnosis not present

## 2017-10-14 DIAGNOSIS — S3992XA Unspecified injury of lower back, initial encounter: Secondary | ICD-10-CM | POA: Diagnosis not present

## 2017-10-14 DIAGNOSIS — S0003XA Contusion of scalp, initial encounter: Secondary | ICD-10-CM | POA: Diagnosis not present

## 2017-10-14 DIAGNOSIS — S199XXA Unspecified injury of neck, initial encounter: Secondary | ICD-10-CM | POA: Diagnosis not present

## 2017-10-14 DIAGNOSIS — S161XXA Strain of muscle, fascia and tendon at neck level, initial encounter: Secondary | ICD-10-CM | POA: Insufficient documentation

## 2017-10-14 DIAGNOSIS — S4992XA Unspecified injury of left shoulder and upper arm, initial encounter: Secondary | ICD-10-CM | POA: Diagnosis not present

## 2017-10-14 DIAGNOSIS — S299XXA Unspecified injury of thorax, initial encounter: Secondary | ICD-10-CM | POA: Diagnosis not present

## 2017-10-14 DIAGNOSIS — Y92018 Other place in single-family (private) house as the place of occurrence of the external cause: Secondary | ICD-10-CM | POA: Insufficient documentation

## 2017-10-14 DIAGNOSIS — Y9389 Activity, other specified: Secondary | ICD-10-CM | POA: Insufficient documentation

## 2017-10-14 DIAGNOSIS — M25512 Pain in left shoulder: Secondary | ICD-10-CM | POA: Diagnosis not present

## 2017-10-14 DIAGNOSIS — R0781 Pleurodynia: Secondary | ICD-10-CM | POA: Diagnosis not present

## 2017-10-14 DIAGNOSIS — F0390 Unspecified dementia without behavioral disturbance: Secondary | ICD-10-CM | POA: Insufficient documentation

## 2017-10-14 DIAGNOSIS — S0512XA Contusion of eyeball and orbital tissues, left eye, initial encounter: Secondary | ICD-10-CM | POA: Diagnosis not present

## 2017-10-14 DIAGNOSIS — Z87891 Personal history of nicotine dependence: Secondary | ICD-10-CM | POA: Diagnosis not present

## 2017-10-14 LAB — BASIC METABOLIC PANEL
ANION GAP: 11 (ref 5–15)
BUN: 20 mg/dL (ref 6–20)
CALCIUM: 8.8 mg/dL — AB (ref 8.9–10.3)
CHLORIDE: 109 mmol/L (ref 101–111)
CO2: 18 mmol/L — AB (ref 22–32)
Creatinine, Ser: 1.37 mg/dL — ABNORMAL HIGH (ref 0.61–1.24)
GFR calc non Af Amer: 43 mL/min — ABNORMAL LOW (ref >60)
GFR, EST AFRICAN AMERICAN: 50 mL/min — AB (ref >60)
GLUCOSE: 153 mg/dL — AB (ref 65–99)
POTASSIUM: 4.1 mmol/L (ref 3.5–5.1)
Sodium: 138 mmol/L (ref 135–145)

## 2017-10-14 LAB — CBC WITH DIFFERENTIAL/PLATELET
BASOS ABS: 0.1 10*3/uL (ref 0.0–0.1)
Basophils Relative: 1 %
Eosinophils Absolute: 0.4 10*3/uL (ref 0.0–0.7)
Eosinophils Relative: 4 %
HEMATOCRIT: 33.6 % — AB (ref 39.0–52.0)
HEMOGLOBIN: 11.1 g/dL — AB (ref 13.0–17.0)
LYMPHS PCT: 25 %
Lymphs Abs: 2.6 10*3/uL (ref 0.7–4.0)
MCH: 31.4 pg (ref 26.0–34.0)
MCHC: 33 g/dL (ref 30.0–36.0)
MCV: 95.2 fL (ref 78.0–100.0)
MONOS PCT: 13 %
Monocytes Absolute: 1.4 10*3/uL — ABNORMAL HIGH (ref 0.1–1.0)
Neutro Abs: 6 10*3/uL (ref 1.7–7.7)
Neutrophils Relative %: 57 %
Platelets: 272 10*3/uL (ref 150–400)
RBC: 3.53 MIL/uL — AB (ref 4.22–5.81)
RDW: 13.9 % (ref 11.5–15.5)
WBC: 10.5 10*3/uL (ref 4.0–10.5)

## 2017-10-14 NOTE — ED Provider Notes (Signed)
Patient placed in Quick Look pathway, seen and evaluated   Chief Complaint: Fall head injury  HPI:   Patient presents with left periorbital pain edema and ecchymosis, left shoulder pain, left hand pain and lower back pain after a fall around the pool yesterday.  Denies loss of consciousness.  He has been ambulatory since and did not want to come to the hospital.  Today the swelling worsened and pain worsened so he decided to come by private vehicle and get evaluated.  ROS: No nausea vomiting or visual disturbances.  Patient is on chronic antiplatelet therapy  Physical Exam:   Gen: No distress  Neuro: Awake and Alert  Skin: Warm    Focused Exam: Left periorbital edema and ecchymosis.  EOM normal. no pain with extraocular motion.  Left pupil slightly more dilated and less reactive s/p cataract surgery.  Tender to palpation of the orbit, ttp of the hand and wrist, midline lumbar, left ribs and left scapula.   Initiation of care has begun. The patient has been counseled on the process, plan, and necessity for staying for the completion/evaluation, and the remainder of the medical screening examination    Dossie Der 10/14/17 2053    Ripley Fraise, MD 10/15/17 [phone]

## 2017-10-14 NOTE — ED Triage Notes (Signed)
Reports tripping and falling today hitting the concrete.  C/o pain in left eye.  Bruising and swelling noted.  Reports being on a blood thinner.  Also c/o pain in left arm, left hand, and both sides of ribcage.

## 2017-10-15 NOTE — ED Provider Notes (Signed)
[facility]ERGENCY DEPARTMENT Provider Note   CSN: [id number] Arrival date & time: 10/14/17  1929     History   Chief Complaint Chief Complaint  Patient presents with  . Fall  . Head Injury    HPI Lathan ZONG MCQUARRIE is a 84 y.o. male.  The history is provided by the patient and a relative.  Fall  This is a new problem. The current episode started yesterday. The problem occurs constantly. The problem has been gradually worsening. Associated symptoms include headaches. Pertinent negatives include no chest pain and no abdominal pain. The symptoms are aggravated by walking. The symptoms are relieved by rest.  Head Injury     Patient with history of CAD, hyperlipidemia presents for follow-up.  Over 24 hours ago he was walking around his pool when he tripped and fell.  He reports that he hit his left-side of his face.  No LOC.  Now reports headache and pain in his face.  He also reports pain in his left upper extremity.  He also reports soreness all over his body.  He takes Plavix. Past Medical History:  Diagnosis Date  . Acid reflux   . Arthritis   . CAD (coronary artery disease)   . Cancer (HCC) hx of skin cancer  . Constipation   . Coronary artery disease   . High cholesterol   . HOH (hard of hearing)    wears hearing aids, but still can not hear  . Hypertension   . Incontinence of urine   . Peripheral vascular disease ([place])    Remote left carotid endarterectomy  . Sleep apnea    CPAP    Patient Active Problem List   Diagnosis Date Noted  . Long-term (current) use of anticoagulants 02/21/2015  . Allergic rhinitis 01/24/2015  . Malnutrition of moderate degree ([place]) 01/16/2015  . Warfarin-induced coagulopathy ([place])   . Melena   . Severe anemia 01/15/2015  . GI bleed 01/15/2015  . Acute blood loss anemia 01/15/2015  . Chronic anticoagulation 06/04/2014  . Dementia 04/19/2014  . Chronic diastolic heart failure ([place]) 04/15/2014  . Chronic kidney  disease (CKD), stage III (moderate) ([place]) 04/15/2014  . Pulmonary embolism ([place]) 04/13/2014  . Spinal stenosis, lumbar region, with neurogenic claudication 02/08/2014    Class: Diagnosis of  . Peripheral arterial disease: History of left carotid endarterectomy 03/15/2013  . Obstructive sleep apnea: Uses CPAP 03/15/2013  . Venous insufficiency 03/15/2013  . Essential hypertension 03/15/2013  . HLD (hyperlipidemia) 03/15/2013  . Chest pain 07/28/2012  . CAD - CFX DES 7/07, LAD DES 10/08, cath 11/11- medical Rx 07/28/2012    Past Surgical History:  Procedure Laterality Date  . 2-D echocardiogram  03/07/2008   Ejection fraction greater than 55%. Mild concentric left ventricular hypertrophy.LV relaxation. mildly dilated left atrium. Mild mitral regurgitation.   . ANAL FISSURE REPAIR    . CARDIAC CATHETERIZATION     X 2 stents  . COLON SURGERY     removed part of the small intestine  . COLONOSCOPY W/ POLYPECTOMY    . CORONARY STENT PLACEMENT    . ESOPHAGOGASTRODUODENOSCOPY N/A 01/16/2015   Procedure: ESOPHAGOGASTRODUODENOSCOPY (EGD);  Surgeon: Irene Shipper, MD;  Location[facility] ENDOSCOPY;  Service: Endoscopy;  Laterality: N/A;  . EYE SURGERY     cataracts  . I&D EXTREMITY  06/28/2012   Procedure: IRRIGATION AND DEBRIDEMENT EXTREMITY;  Surgeon: Newt Minion, MD;  Location: [place];  Service: Orthopedics;  Laterality: Right;  Debridement Wound Right  Leg, VAC, Antibiotic Beads, Apply A-Cell  . IRRIGATION AND DEBRIDEMENT ABSCESS    . LAPAROTOMY    . LUMBAR LAMINECTOMY N/A 02/08/2014   Procedure: L3-4, L4-5 Decompression;  Surgeon: Marybelle Killings, MD;  Location: [facility];  Service: Orthopedics;  Laterality: N/A;  L3-4, L4-5 Decompression  . NASAL SINUS SURGERY    . PROSTATE SURGERY    . ROTATOR CUFF REPAIR          Home Medications    Prior to Admission medications   Medication Sig Start Date End Date Taking? Authorizing Provider  acetaminophen (TYLENOL) 500 MG tablet Take 1,000 mg by mouth 2  (two) times daily as needed for mild pain.     [provider]  amLODipine (NORVASC) 10 MG tablet Take 10 mg by mouth daily.    [provider]  Ascorbic Acid (VITAMIN C) 1000 MG tablet Take 1,000 mg by mouth daily. 07/01/10   [provider]  aspirin EC 81 MG tablet Take 81 mg by mouth at bedtime.     [provider]  clobetasol (TEMOVATE) 0.05 % external solution Apply 1 application topically See admin instructions. 1 application every morning, and 1 application twice daily during break outs 11/03/15   [provider]  clopidogrel (PLAVIX) 75 MG tablet Take 1 tablet (75 mg total) by mouth daily. 03/26/16   Lorretta Harp, MD  ferrous sulfate 325 (65 FE) MG tablet Take 325 mg by mouth daily with breakfast.    [provider]  hydrOXYzine (VISTARIL) 25 MG capsule Take 25 mg by mouth every morning.     [provider]  loratadine (CLARITIN) 10 MG tablet Take 5 mg by mouth daily.    [provider]  montelukast (SINGULAIR) 10 MG tablet Take 10 mg by mouth at bedtime.    [provider]  Multiple Vitamin (MULTIVITAMIN WITH MINERALS) TABS Take 1 tablet by mouth daily. [employer] brand multivitamin    [provider]  Multiple Vitamins-Minerals (OCUVITE ADULT 50+ PO) Take 1 tablet by mouth daily.     [provider]  mupirocin ointment (BACTROBAN) 2 % Apply 1 application topically at bedtime.  02/11/16   [provider]  nitroGLYCERIN (NITROSTAT) 0.4 MG SL tablet Place 1 tablet (0.4 mg total) under the tongue every 5 (five) minutes as needed for chest pain. 09/25/15   Lorretta Harp, MD  Omega-3 Fatty Acids (FISH OIL) 1200 MG CAPS Take 1,200 mg by mouth daily.     [provider]  OVER THE COUNTER MEDICATION Place 1 drop into both eyes daily as needed (itching). Over the counter allergy eye drops    [provider]  pantoprazole (PROTONIX) 40 MG tablet Take 1 tablet (40 mg total) by  mouth daily. 04/02/16   Lorretta Harp, MD  polyethylene glycol [facility] / Floria Raveling) packet Take 17 g by mouth every morning.     [provider]  pyridOXINE (VITAMIN B-6) 50 MG tablet Take 50 mg by mouth daily.    [provider]  simvastatin (ZOCOR) 10 MG tablet Take 1 tablet (10 mg total) by mouth at bedtime. 04/02/16   Lorretta Harp, MD    Family History Family History  Problem Relation Age of Onset  . Hypertension Mother     Social History Social History   Tobacco Use  . Smoking status: Former Smoker    Types: Cigarettes    Last attempt to quit: 07/12/1978    Years since quitting: 39.2  .  Smokeless tobacco: Never Used  Substance Use Topics  . Alcohol use: No  . Drug use: No     Allergies   Ciprofloxacin; Tape; and Dapsone   Review of Systems Review of Systems  Constitutional: Negative for fever.  Cardiovascular: Negative for chest pain.  Gastrointestinal: Negative for abdominal pain.  Musculoskeletal: Positive for arthralgias and joint swelling.  Neurological: Positive for headaches.  All other systems reviewed and are negative.    Physical Exam Updated Vital Signs BP (!) 182/60   Pulse 71   Temp 98.1 F (36.7 C)   Resp 17   Ht 1.702 m (5\' 7" )   Wt 78.9 kg (174 lb)   SpO2 97%   BMI 27.25 kg/m   Physical Exam  CONSTITUTIONAL: Elderly and frail HEAD: Left periorbital edema and ecchymosis.  Dried blood noted EYES: EOMI/PERRL, no proptosis no evidence of globe injury ENMT: Mucous membranes moist, poor dentition but no signs of dental injury NECK: supple no meningeal signs SPINE/BACK:entire spine nontender, kyphotic spine, no bruising/crepitance/stepoffs noted to spine CV: S1/S2 noted, no murmurs/rubs/gallops noted Chest-no crepitus or bruising LUNGS: Lungs are clear to auscultation bilaterally, no apparent distress ABDOMEN: soft, nontender, no rebound or guarding, bowel sounds noted throughout abdomen GU:no cva  tenderness NEURO: Pt is awake/alert/appropriate, moves all extremitiesx4.  No facial droop.  GCS is 15 EXTREMITIES: pulses normal/equal, full ROM, swelling to left hand, minimal tenderness to left middle finger. Pelvis stable, All other extremities/joints palpated/ranged and nontender SKIN: warm, color normal PSYCH: no abnormalities of mood noted, alert and oriented to situation  ED Treatments / Results  Labs (all labs ordered are listed, but only abnormal results are displayed) Labs Reviewed  CBC WITH DIFFERENTIAL/PLATELET - Abnormal; Notable for the following components:      Result Value   RBC 3.53 (*)    Hemoglobin 11.1 (*)    HCT 33.6 (*)    Monocytes Absolute 1.4 (*)    All other components within normal limits  BASIC METABOLIC PANEL - Abnormal; Notable for the following components:   CO2 18 (*)    Glucose, Bld 153 (*)    Creatinine, Ser 1.37 (*)    Calcium 8.8 (*)    GFR calc non Af Amer 43 (*)    GFR calc Af Amer 50 (*)    All other components within normal limits    EKG None  Radiology Dg Chest 2 View  Result Date: 10/14/2017 CLINICAL DATA:  Left-sided rib pain after fall. EXAM: CHEST - 2 VIEW COMPARISON:  Chest x-ray dated Nov 11, 2015. FINDINGS: Stable mild cardiomegaly. Normal pulmonary vascularity. Chronically coarsened interstitial markings are similar to prior studies. Persistent low lung volumes. No focal consolidation, pleural effusion, or pneumothorax. No acute osseous abnormality. Old left-sided rib fractures. Chronic mild compression deformity of a midthoracic vertebral body. IMPRESSION: No active cardiopulmonary disease. Electronically Signed   By: Titus Dubin M.D.   On: 10/14/2017 22:21   Dg Lumbar Spine Complete  Result Date: 10/14/2017 CLINICAL DATA:  Low back soreness after fall. EXAM: LUMBAR SPINE - COMPLETE 4+ VIEW COMPARISON:  Lumbar spine x-rays dated February 04, 2014. FINDINGS: Five lumbar type vertebral bodies. New age-indeterminate mild inferior  endplate compression fracture of L3. Remaining vertebral body heights are preserved. Unchanged trace anterolisthesis at L2-L3 and L3-L4. Unchanged trace retrolisthesis at L4-L5. Progressive moderate disc height loss at L3-L4 and L4-L5 and severe disc height loss at L5-S1. Moderate lower lumbar facet arthropathy. The pubic symphysis and sacroiliac joints are intact. Aortic  atherosclerosis. IMPRESSION: 1. Age indeterminate mild inferior endplate compression fracture of L3, new since 2015. Correlate with point tenderness. 2. Progressive degenerative disc disease of the lower lumbar spine, severe at L5-S1. Electronically Signed   By: Titus Dubin M.D.   On: 10/14/2017 22:17   Dg Wrist Complete Left  Result Date: 10/14/2017 CLINICAL DATA:  Trip and fall today.  Pain. EXAM: LEFT HAND - COMPLETE 3+ VIEW; LEFT WRIST - COMPLETE 3+ VIEW COMPARISON:  LEFT wrist radiograph Nov 11, 2015 and Nov 12, 2015 FINDINGS: LEFT wrist: No fracture deformity or dislocation. No destructive bony lesions. Osteopenia. Severe similar first carpometacarpal and medial wrist joint space narrowing with periarticular sclerosis and marginal spurring. Soft tissue planes are nonsuspicious. LEFT hand: Slight palmar cortical irregularity third proximal phalanx metaphysis seen only on oblique view. Mildly subluxed third PIP joint space, likely degenerative. No frank dislocation. Foreshortened second distal phalanx with corticated bony fragments, likely representing old trauma. Multifocal severe osteoarthrosis. Osteopenia. Dorsal hand soft tissue swelling with punctate superficial foreign body, stable from prior imaging. IMPRESSION: Potential nondisplaced third proximal phalanx fracture, recommend correlation with point tenderness. No dislocation. Chronic deformity second distal phalanx. Stable multifocal severe osteoarthrosis. Osteopenia decreasing sensitivity for acute nondisplaced fractures. Electronically Signed   By: Elon Alas M.D.   On:  10/14/2017 22:24   Ct Head Wo Contrast  Result Date: 10/14/2017 CLINICAL DATA:  Head and face trauma after fall. EXAM: CT HEAD WITHOUT CONTRAST CT MAXILLOFACIAL WITHOUT CONTRAST CT CERVICAL SPINE WITHOUT CONTRAST TECHNIQUE: Multidetector CT imaging of the head, cervical spine, and maxillofacial structures were performed using the standard protocol without intravenous contrast. Multiplanar CT image reconstructions of the cervical spine and maxillofacial structures were also generated. COMPARISON:  CT head and face dated March 07, 2016. FINDINGS: CT HEAD FINDINGS Brain: No evidence of acute infarction, hemorrhage, hydrocephalus, extra-axial collection or mass lesion/mass effect. 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 Stable atrophy and chronic microvascular ischemic changes. Vascular: Atherosclerotic vascular calcification of the carotid siphons. No hyperdense vessel. Skull: Normal. Negative for fracture or focal lesion. Other: Left frontal scalp hematoma. CT MAXILLOFACIAL FINDINGS Osseous: No fracture or mandibular dislocation. No destructive process. Orbits: Mild left periorbital hematoma. No other traumatic or inflammatory finding. Sinuses: Clear. Soft tissues: Mild left malar soft tissue swelling. Otherwise negative. CT CERVICAL SPINE FINDINGS Alignment: No traumatic malalignment. Trace stepwise anterolisthesis at C3-C4 and C4-C5 due to facet arthropathy. Trace retrolisthesis at C5-C6. Skull base and vertebrae: No acute fracture. No primary bone lesion or focal pathologic process. Soft tissues and spinal canal: No prevertebral fluid or swelling. No visible canal hematoma. Disc levels: Multilevel degenerative disc disease throughout the cervical spine, worst at C5-C6. Ankylosis of the bilateral C3-C4 and right C4-C5 facets. Moderate bilateral neuroforaminal stenosis at C2-C3, C3-C4, and C5-C6. Mild central spinal canal stenosis at C5-C6. Upper chest: Biapical pleuroparenchymal  scarring. Other: Atherosclerotic vascular calcifications at the bilateral carotid bifurcations. IMPRESSION: 1. No acute intracranial abnormality. Small left frontal scalp and periorbital hematoma. 2. No acute facial fracture.  Mild left malar soft tissue swelling. 3. No acute cervical spine fracture. Degenerative changes of the cervical spine as described above. Electronically Signed   By: Titus Dubin M.D.   On: 10/14/2017 22:44   Ct Cervical Spine Wo Contrast  Result Date: 10/14/2017 CLINICAL DATA:  Head and face trauma after fall. EXAM: CT HEAD WITHOUT CONTRAST CT MAXILLOFACIAL WITHOUT CONTRAST CT CERVICAL SPINE WITHOUT CONTRAST TECHNIQUE: Multidetector CT imaging of the head, cervical spine, and maxillofacial  structures were performed using the standard protocol without intravenous contrast. Multiplanar CT image reconstructions of the cervical spine and maxillofacial structures were also generated. COMPARISON:  CT head and face dated March 07, 2016. FINDINGS: CT HEAD FINDINGS Brain: No evidence of acute infarction, hemorrhage, hydrocephalus, extra-axial collection or mass lesion/mass effect. 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 Stable atrophy and chronic microvascular ischemic changes. Vascular: Atherosclerotic vascular calcification of the carotid siphons. No hyperdense vessel. Skull: Normal. Negative for fracture or focal lesion. Other: Left frontal scalp hematoma. CT MAXILLOFACIAL FINDINGS Osseous: No fracture or mandibular dislocation. No destructive process. Orbits: Mild left periorbital hematoma. No other traumatic or inflammatory finding. Sinuses: Clear. Soft tissues: Mild left malar soft tissue swelling. Otherwise negative. CT CERVICAL SPINE FINDINGS Alignment: No traumatic malalignment. Trace stepwise anterolisthesis at C3-C4 and C4-C5 due to facet arthropathy. Trace retrolisthesis at C5-C6. Skull base and vertebrae: No acute fracture. No primary bone lesion or  focal pathologic process. Soft tissues and spinal canal: No prevertebral fluid or swelling. No visible canal hematoma. Disc levels: Multilevel degenerative disc disease throughout the cervical spine, worst at C5-C6. Ankylosis of the bilateral C3-C4 and right C4-C5 facets. Moderate bilateral neuroforaminal stenosis at C2-C3, C3-C4, and C5-C6. Mild central spinal canal stenosis at C5-C6. Upper chest: Biapical pleuroparenchymal scarring. Other: Atherosclerotic vascular calcifications at the bilateral carotid bifurcations. IMPRESSION: 1. No acute intracranial abnormality. Small left frontal scalp and periorbital hematoma. 2. No acute facial fracture.  Mild left malar soft tissue swelling. 3. No acute cervical spine fracture. Degenerative changes of the cervical spine as described above. Electronically Signed   By: Titus Dubin M.D.   On: 10/14/2017 22:44   Dg Shoulder Left  Result Date: 10/14/2017 CLINICAL DATA:  84 y/o  M; fall with left shoulder and back pain. EXAM: LEFT SHOULDER - 2+ VIEW COMPARISON:  None. FINDINGS: No acute fracture or dislocation identified. Decreased acromial humeral distance compatible with rotator cuff injury. Small ossific density adjacent to the greater tubercle, probably rotator cuff calcific tendinitis. Anchor from rotator cuff surgical repair within the humeral head. Osteoarthrosis of the glenohumeral joint with periarticular osteophytes. IMPRESSION: No acute fracture or dislocation identified. Findings of rotator cuff injury, tendinitis, and surgical repair. Electronically Signed   By: Kristine Garbe M.D.   On: 10/14/2017 22:17   Dg Hand Complete Left  Result Date: 10/14/2017 CLINICAL DATA:  Trip and fall today.  Pain. EXAM: LEFT HAND - COMPLETE 3+ VIEW; LEFT WRIST - COMPLETE 3+ VIEW COMPARISON:  LEFT wrist radiograph Nov 11, 2015 and Nov 12, 2015 FINDINGS: LEFT wrist: No fracture deformity or dislocation. No destructive bony lesions. Osteopenia. Severe similar first  carpometacarpal and medial wrist joint space narrowing with periarticular sclerosis and marginal spurring. Soft tissue planes are nonsuspicious. LEFT hand: Slight palmar cortical irregularity third proximal phalanx metaphysis seen only on oblique view. Mildly subluxed third PIP joint space, likely degenerative. No frank dislocation. Foreshortened second distal phalanx with corticated bony fragments, likely representing old trauma. Multifocal severe osteoarthrosis. Osteopenia. Dorsal hand soft tissue swelling with punctate superficial foreign body, stable from prior imaging. IMPRESSION: Potential nondisplaced third proximal phalanx fracture, recommend correlation with point tenderness. No dislocation. Chronic deformity second distal phalanx. Stable multifocal severe osteoarthrosis. Osteopenia decreasing sensitivity for acute nondisplaced fractures. Electronically Signed   By: Elon Alas M.D.   On: 10/14/2017 22:24   Ct Maxillofacial Wo Contrast  Result Date: 10/14/2017 CLINICAL DATA:  Head and face trauma after fall. EXAM: CT HEAD WITHOUT CONTRAST CT MAXILLOFACIAL  WITHOUT CONTRAST CT CERVICAL SPINE WITHOUT CONTRAST TECHNIQUE: Multidetector CT imaging of the head, cervical spine, and maxillofacial structures were performed using the standard protocol without intravenous contrast. Multiplanar CT image reconstructions of the cervical spine and maxillofacial structures were also generated. COMPARISON:  CT head and face dated March 07, 2016. FINDINGS: CT HEAD FINDINGS Brain: No evidence of acute infarction, hemorrhage, hydrocephalus, extra-axial collection or mass lesion/mass effect. 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 Stable atrophy and chronic microvascular ischemic changes. Vascular: Atherosclerotic vascular calcification of the carotid siphons. No hyperdense vessel. Skull: Normal. Negative for fracture or focal lesion. Other: Left frontal scalp hematoma. CT MAXILLOFACIAL  FINDINGS Osseous: No fracture or mandibular dislocation. No destructive process. Orbits: Mild left periorbital hematoma. No other traumatic or inflammatory finding. Sinuses: Clear. Soft tissues: Mild left malar soft tissue swelling. Otherwise negative. CT CERVICAL SPINE FINDINGS Alignment: No traumatic malalignment. Trace stepwise anterolisthesis at C3-C4 and C4-C5 due to facet arthropathy. Trace retrolisthesis at C5-C6. Skull base and vertebrae: No acute fracture. No primary bone lesion or focal pathologic process. Soft tissues and spinal canal: No prevertebral fluid or swelling. No visible canal hematoma. Disc levels: Multilevel degenerative disc disease throughout the cervical spine, worst at C5-C6. Ankylosis of the bilateral C3-C4 and right C4-C5 facets. Moderate bilateral neuroforaminal stenosis at C2-C3, C3-C4, and C5-C6. Mild central spinal canal stenosis at C5-C6. Upper chest: Biapical pleuroparenchymal scarring. Other: Atherosclerotic vascular calcifications at the bilateral carotid bifurcations. IMPRESSION: 1. No acute intracranial abnormality. Small left frontal scalp and periorbital hematoma. 2. No acute facial fracture.  Mild left malar soft tissue swelling. 3. No acute cervical spine fracture. Degenerative changes of the cervical spine as described above. Electronically Signed   By: Titus Dubin M.D.   On: 10/14/2017 22:44    Procedures Procedures (including critical care time) SPLINT APPLICATION Date/Time: 1:28 AM Authorized by: Sharyon Cable Consent: Verbal consent obtained. Risks and benefits: risks, benefits and alternatives were discussed Consent given by: patient Splint applied by: nurse Location details: left hand/finger Splint type: finger splint Supplies used: splint Post-procedure: The splinted body part was neurovascularly unchanged following the procedure. Patient tolerance: Patient tolerated the procedure well with no immediate complications.    Medications  Ordered in ED Medications - No data to display   Initial Impression / Assessment and Plan / ED Course  I have reviewed the triage vital signs and the nursing notes.  Pertinent labs & imaging results that were available during my care of the patient were reviewed by me and considered in my medical decision making (see chart for details).     Stable presenting for fall over 24 hours ago.  No signs of head/face/neck injury Only injury noted is potential left phalanx fracture.  Splint applied.  Patient otherwise stable, he is ambulatory.  We will follow-up with [facility] in 1 week for repeat x-rays.  Discussed with patient and his daughter.  Agreeable to plan  Final Clinical Impressions(s) / ED Diagnoses   Final diagnoses:  Blunt trauma to chest, initial encounter  Closed nondisplaced fracture of proximal phalanx of left middle finger, initial encounter  Injury of head, initial encounter  Strain of neck muscle, initial encounter    ED Discharge Orders    None       Ripley Fraise, MD 10/15/17 [id number]

## 2017-11-22 DIAGNOSIS — H9201 Otalgia, right ear: Secondary | ICD-10-CM | POA: Diagnosis not present

## 2017-11-24 DIAGNOSIS — H532 Diplopia: Secondary | ICD-10-CM | POA: Diagnosis not present

## 2017-11-24 DIAGNOSIS — H35032 Hypertensive retinopathy, left eye: Secondary | ICD-10-CM | POA: Diagnosis not present

## 2017-11-24 DIAGNOSIS — H353133 Nonexudative age-related macular degeneration, bilateral, advanced atrophic without subfoveal involvement: Secondary | ICD-10-CM | POA: Diagnosis not present

## 2017-11-24 DIAGNOSIS — Z961 Presence of intraocular lens: Secondary | ICD-10-CM | POA: Diagnosis not present

## 2017-12-28 DIAGNOSIS — L304 Erythema intertrigo: Secondary | ICD-10-CM | POA: Diagnosis not present

## 2017-12-28 DIAGNOSIS — L308 Other specified dermatitis: Secondary | ICD-10-CM | POA: Diagnosis not present

## 2018-02-07 DIAGNOSIS — L309 Dermatitis, unspecified: Secondary | ICD-10-CM | POA: Diagnosis not present

## 2018-02-07 DIAGNOSIS — Z1389 Encounter for screening for other disorder: Secondary | ICD-10-CM | POA: Diagnosis not present

## 2018-02-07 DIAGNOSIS — I10 Essential (primary) hypertension: Secondary | ICD-10-CM | POA: Diagnosis not present

## 2018-02-07 DIAGNOSIS — R296 Repeated falls: Secondary | ICD-10-CM | POA: Diagnosis not present

## 2018-02-07 DIAGNOSIS — I2510 Atherosclerotic heart disease of native coronary artery without angina pectoris: Secondary | ICD-10-CM | POA: Diagnosis not present

## 2018-02-07 DIAGNOSIS — E785 Hyperlipidemia, unspecified: Secondary | ICD-10-CM | POA: Diagnosis not present

## 2018-02-07 DIAGNOSIS — Z23 Encounter for immunization: Secondary | ICD-10-CM | POA: Diagnosis not present

## 2018-02-07 DIAGNOSIS — F0150 Vascular dementia without behavioral disturbance: Secondary | ICD-10-CM | POA: Diagnosis not present

## 2018-02-07 DIAGNOSIS — Z0000 Encounter for general adult medical examination without abnormal findings: Secondary | ICD-10-CM | POA: Diagnosis not present

## 2018-02-07 DIAGNOSIS — N183 Chronic kidney disease, stage 3 (moderate): Secondary | ICD-10-CM | POA: Diagnosis not present

## 2018-02-07 DIAGNOSIS — I5032 Chronic diastolic (congestive) heart failure: Secondary | ICD-10-CM | POA: Diagnosis not present

## 2018-02-22 ENCOUNTER — Ambulatory Visit: Payer: Medicare HMO | Admitting: Physical Therapy

## 2018-02-23 DIAGNOSIS — G4733 Obstructive sleep apnea (adult) (pediatric): Secondary | ICD-10-CM | POA: Diagnosis not present

## 2018-02-27 ENCOUNTER — Encounter: Payer: Self-pay | Admitting: Physical Therapy

## 2018-02-27 ENCOUNTER — Ambulatory Visit: Payer: Medicare HMO | Attending: Family Medicine | Admitting: Physical Therapy

## 2018-02-27 DIAGNOSIS — Z9181 History of falling: Secondary | ICD-10-CM

## 2018-02-27 DIAGNOSIS — R2681 Unsteadiness on feet: Secondary | ICD-10-CM

## 2018-02-27 DIAGNOSIS — M6281 Muscle weakness (generalized): Secondary | ICD-10-CM

## 2018-02-27 NOTE — Patient Instructions (Signed)
Access Code: [email or web address]  URL: [email or web address]  Date: 02/27/2018  Prepared by: Elly Modena   Exercises  Heel Raises with Gilford Rile and Chair - 15 reps - 2 sets - 2x daily - 7x weekly  Sit to Stand - 10 reps - 2 sets - 2x daily - 7x weekly    [facility] [address] [place], [place] [address] Phone # [phone] Fax [phone]

## 2018-02-27 NOTE — Therapy (Signed)
[facility] [address] [place], [place], [address] Phone: [phone]   Fax:  [phone]  Physical Therapy Evaluation  Patient Details  Name: DOMINGO FUSON MRN: [id number] Date of Birth: Nov 16, 1923 Referring Provider: Dineen Kid, MD   Encounter Date: 02/27/2018  PT End of Session - 02/27/18 1309    Visit Number  1    Date for PT Re-Evaluation  04/29/18    Authorization Type  [employer]     Authorization Time Period  02/27/18 to 04/29/18    Authorization - Visit Number  1    Authorization - Number of Visits  10    PT Start Time  8828    PT Stop Time  1315    PT Time Calculation (min)  40 min    Activity Tolerance  No increased pain;Patient tolerated treatment well    Behavior During Therapy  [facility] for tasks assessed/performed       Past Medical History:  Diagnosis Date  . Acid reflux   . Arthritis   . CAD (coronary artery disease)   . Cancer (HCC) hx of skin cancer  . Constipation   . Coronary artery disease   . High cholesterol   . HOH (hard of hearing)    wears hearing aids, but still can not hear  . Hypertension   . Incontinence of urine   . Peripheral vascular disease ([place])    Remote left carotid endarterectomy  . Sleep apnea    CPAP    Past Surgical History:  Procedure Laterality Date  . 2-D echocardiogram  03/07/2008   Ejection fraction greater than 55%. Mild concentric left ventricular hypertrophy.LV relaxation. mildly dilated left atrium. Mild mitral regurgitation.   . ANAL FISSURE REPAIR    . CARDIAC CATHETERIZATION     X 2 stents  . COLON SURGERY     removed part of the small intestine  . COLONOSCOPY W/ POLYPECTOMY    . CORONARY STENT PLACEMENT    . ESOPHAGOGASTRODUODENOSCOPY N/A 01/16/2015   Procedure: ESOPHAGOGASTRODUODENOSCOPY (EGD);  Surgeon: Irene Shipper, MD;  Location: [facility] END[facility]COPY;  Service: Endoscopy;  Laterality: N/A;  . EYE SURGERY     cataracts  . I&D EXTREMITY   06/28/2012   Procedure: IRRIGATION AND DEBRIDEMENT EXTREMITY;  Surgeon: Newt Minion, MD;  Location: [place];  Service: Orthopedics;  Laterality: Right;  Debridement Wound Right Leg, VAC, Antibiotic Beads, Apply A-Cell  . IRRIGATION AND DEBRIDEMENT ABSCESS    . LAPAROTOMY    . LUMBAR LAMINECTOMY N/A 02/08/2014   Procedure: L3-4, L4-5 Decompression;  Surgeon: Marybelle Killings, MD;  Location: [place];  Service: Orthopedics;  Laterality: N/A;  L3-4, L4-5 Decompression  . NASAL SINUS SURGERY    . PROSTATE SURGERY    . ROTATOR CUFF REPAIR      There were no vitals filed for this visit.   Subjective Assessment - 02/27/18 1240    Subjective  Pt reports that he loses his balance alot. His caregiver says he falls "alot". Pt states that he has some Lt knee pain occasionally. No big injuries from his falls other than scraping his forearm.     Limitations  Walking    Patient Stated Goals  decrease risk of falling     Currently in Pain?  No/denies         [facility] PT Assessment - 02/27/18 0001      Assessment   Medical Diagnosis  repeated falls     Referring  Provider  Dineen Kid, MD    Prior Therapy  none for balance       Precautions   Precautions  Fall      Restrictions   Weight Bearing Restrictions  No      Balance Screen   Has the patient fallen in the past 6 months  Yes    How many times?  "alot"     Has the patient had a decrease in activity level because of a fear of falling?   Yes    Is the patient reluctant to leave their home because of a fear of falling?   Yes      [place] residence    Additional Comments  pt lives with his daughter and has a caregiver during the week days       Prior Function   Level of Independence  Independent      Cognition   Overall Cognitive Status  Within Functional Limits for tasks assessed      ROM / Strength   AROM / PROM / Strength  Strength      Strength   Strength Assessment Site  Ankle;Hip;Knee     Right/Left Hip  Right;Left    Right Hip Flexion  3/5    Right Hip Extension  3/5    Right Hip ABduction  3/5    Left Hip Flexion  3/5    Left Hip Extension  3/5    Left Hip ABduction  3/5    Right/Left Knee  Right;Left    Right Knee Extension  4/5    Left Knee Extension  5/5    Right/Left Ankle  Right;Left    Right Ankle Dorsiflexion  3/5    Left Ankle Dorsiflexion  3/5      Transfers   Five time sit to stand comments   17 sec, no UE      Ambulation/Gait   Gait Comments  decreased step length, flexed posture      Standardized Balance Assessment   Standardized Balance Assessment  Timed Up and Go Test;Berg Balance Test      Berg Balance Test   Sit to Stand  Able to stand  independently using hands    Standing Unsupported  Able to stand 2 minutes with supervision    Sitting with Back Unsupported but Feet Supported on Floor or Stool  Able to sit safely and securely 2 minutes    Stand to Sit  Controls descent by using hands    Transfers  Able to transfer with verbal cueing and /or supervision    Standing Unsupported with Eyes Closed  Able to stand 10 seconds with supervision    Standing Ubsupported with Feet Together  Able to place feet together independently and stand for 1 minute with supervision    From Standing, Reach Forward with Outstretched Arm  Reaches forward but needs supervision    From Standing Position, Pick up Object from Floor  Able to pick up shoe, needs supervision    From Standing Position, Turn to Look Behind Over each Shoulder  Needs supervision when turning    Turn 360 Degrees  Able to turn 360 degrees safely but slowly    Standing Unsupported, Alternately Place Feet on Step/Stool  Able to complete >2 steps/needs minimal assist    Standing Unsupported, One Foot in Front  Able to take small step independently and hold 30 seconds    Standing on One Leg  Unable  to try or needs assist to prevent fall    Total Score  31      Timed Up and Go Test   TUG Comments  28  sec, SPC                Objective measurements completed on examination: See above findings.      [place] Adult PT Treatment/Exercise - 02/27/18 0001      Exercises   Exercises  Other Exercises    Other Exercises   heel raises x15 reps                PT Short Term Goals - 02/27/18 1327      PT SHORT TERM GOAL #1   Title  Pt will demo consistency and independence with his initial HEP to improve LE strength.     Time  4    Period  Weeks    Status  New    Target Date  03/30/18        PT Long Term Goals - 02/27/18 1327      PT LONG TERM GOAL #1   Title  Pt will have increased BLE strenth to atleast 4/5 MMT which will improve his safety and efficiency with tasks at home.     Time  8    Period  Weeks    Status  New    Target Date  04/29/18      PT LONG TERM GOAL #2   Title  Pt and his caregiver will be able to verbalize atleast 4 ways to decrease his risk of falling at home.    Time  8    Period  Weeks    Status  New      PT LONG TERM GOAL #3   Title  Pt will complete the TUG with LRAD in less than 14 sec to reflect improvements in his balance and stability during functional activity.     Time  8    Period  Weeks    Status  New      PT LONG TERM GOAL #4   Title  Pt will complete 5x sit to stand in less than 14 sec without UE support, to reflect improvements in functional strength and power.     Time  8    Period  Weeks    Status  New      PT LONG TERM GOAL #5   Title  Pt will have a clinically significant improvement in his Berg balance score by atleast 8 points, to decrease his risk of recurrent falls and injury.     Time  8    Period  Weeks    Status  New             Plan - 02/27/18 1311    Clinical Impression Statement  Pt is a pleasant 84 y.o M referred to OPPT with concerns over his recurrent falls over the past several years. Pt demonstrates significant weakness of BLEs, limited ability to complete the TUG and 5x sit to stand, and  demonstrated near LOB when attempting to hold his cup while walking back for his evaluation. He scored 31 out of 56 on the Berg balance test, placing him at significantly high risk of falling. Therapist encouraged pt to use his RW rather than the [facility] due to his poor balance and will continue to encourage this moving forward. Pt would benefit from skilled PT to address his limitations in strength, endurance and  proprioception in order to avoid risk of future falls and injury.     History and Personal Factors relevant to plan of care:  recurrent falls with injury over the past several years     Clinical Presentation  Evolving    Clinical Presentation due to:  falls for 5+ years    Clinical Decision Making  Moderate    Rehab Potential  Good    PT Frequency  2x / week    PT Duration  8 weeks    PT Treatment/Interventions  ADLs/Self Care Home Management;Gait training;Stair training;Functional mobility training;Patient/family education;Therapeutic exercise;Manual techniques;Passive range of motion;Neuromuscular re-education;Balance training    PT Next Visit Plan  update HEP with more LE strengthening; seated balance/reactive exercise; static standing balance    PT Home Exercise Plan  [id number]    Consulted and Agree with Plan of Care  Patient;Family member/caregiver    Family Member Consulted  caregiver        Patient will benefit from skilled therapeutic intervention in order to improve the following deficits and impairments:  Abnormal gait, Decreased activity tolerance, Decreased strength, Impaired flexibility, Improper body mechanics, Decreased range of motion, Decreased endurance, Decreased balance, Difficulty walking  Visit Diagnosis: History of falling  Muscle weakness (generalized)  Unsteadiness on feet     Problem List Patient Active Problem List   Diagnosis Date Noted  . Long-term (current) use of anticoagulants 02/21/2015  . Allergic rhinitis 01/24/2015  . Malnutrition of moderate  degree ([place]) 01/16/2015  . Warfarin-induced coagulopathy ([place])   . Melena   . Severe anemia 01/15/2015  . GI bleed 01/15/2015  . Acute blood loss anemia 01/15/2015  . Chronic anticoagulation 06/04/2014  . Dementia 04/19/2014  . Chronic diastolic heart failure ([place]) 04/15/2014  . Chronic kidney disease (CKD), stage III (moderate) ([place]) 04/15/2014  . Pulmonary embolism ([place]) 04/13/2014  . Spinal stenosis, lumbar region, with neurogenic claudication 02/08/2014    Class: Diagnosis of  . Peripheral arterial disease: History of left carotid endarterectomy 03/15/2013  . Obstructive sleep apnea: Uses CPAP 03/15/2013  . Venous insufficiency 03/15/2013  . Essential hypertension 03/15/2013  . HLD (hyperlipidemia) 03/15/2013  . Chest pain 07/28/2012  . CAD - CFX DES 7/07, LAD DES 10/08, cath 11/11- medical Rx 07/28/2012    1:39 PM,02/27/18 Sherol Dade PT, DPT [place] [facility] [address], [place], [place], [address] Phone: [phone]   Fax:  [phone]  Name: LILTON PARE MRN: [id number] Date of Birth: [birth year]

## 2018-03-02 ENCOUNTER — Ambulatory Visit: Payer: Medicare HMO | Admitting: Physical Therapy

## 2018-03-06 ENCOUNTER — Ambulatory Visit: Payer: Medicare HMO | Admitting: Physical Therapy

## 2018-03-06 ENCOUNTER — Encounter: Payer: Self-pay | Admitting: Physical Therapy

## 2018-03-06 DIAGNOSIS — M6281 Muscle weakness (generalized): Secondary | ICD-10-CM

## 2018-03-06 DIAGNOSIS — Z9181 History of falling: Secondary | ICD-10-CM | POA: Diagnosis not present

## 2018-03-06 DIAGNOSIS — R2681 Unsteadiness on feet: Secondary | ICD-10-CM | POA: Diagnosis not present

## 2018-03-06 NOTE — Therapy (Signed)
[facility] [address] [place], [place], [address] Phone: [phone]   Fax:  [phone]  Physical Therapy Treatment  Patient Details  Name: Larry Huffman MRN: [id number] Date of Birth: 12/27/1923 Referring Provider: Dineen Kid, MD   Encounter Date: 03/06/2018  PT End of Session - 03/06/18 1503    Visit Number  2    Date for PT Re-Evaluation  04/29/18    Authorization Type  [employer]     Authorization Time Period  02/27/18 to 04/29/18    Authorization - Visit Number  2    Authorization - Number of Visits  10    PT Start Time  9233    PT Stop Time  0076    PT Time Calculation (min)  39 min    Activity Tolerance  No increased pain;Patient tolerated treatment well    Behavior During Therapy  [facility] for tasks assessed/performed       Past Medical History:  Diagnosis Date  . Acid reflux   . Arthritis   . CAD (coronary artery disease)   . Cancer (HCC) hx of skin cancer  . Constipation   . Coronary artery disease   . High cholesterol   . HOH (hard of hearing)    wears hearing aids, but still can not hear  . Hypertension   . Incontinence of urine   . Peripheral vascular disease ([place])    Remote left carotid endarterectomy  . Sleep apnea    CPAP    Past Surgical History:  Procedure Laterality Date  . 2-D echocardiogram  03/07/2008   Ejection fraction greater than 55%. Mild concentric left ventricular hypertrophy.LV relaxation. mildly dilated left atrium. Mild mitral regurgitation.   . ANAL FISSURE REPAIR    . CARDIAC CATHETERIZATION     X 2 stents  . COLON SURGERY     removed part of the small intestine  . COLONOSCOPY W/ POLYPECTOMY    . CORONARY STENT PLACEMENT    . ESOPHAGOGASTRODUODENOSCOPY N/A 01/16/2015   Procedure: ESOPHAGOGASTRODUODENOSCOPY (EGD);  Surgeon: Irene Shipper, MD;  Location: [facility] ENDOSCOPY;  Service: Endoscopy;  Laterality: N/A;  . EYE SURGERY     cataracts  . I&D EXTREMITY   06/28/2012   Procedure: IRRIGATION AND DEBRIDEMENT EXTREMITY;  Surgeon: Newt Minion, MD;  Location: [place];  Service: Orthopedics;  Laterality: Right;  Debridement Wound Right Leg, VAC, Antibiotic Beads, Apply A-Cell  . IRRIGATION AND DEBRIDEMENT ABSCESS    . LAPAROTOMY    . LUMBAR LAMINECTOMY N/A 02/08/2014   Procedure: L3-4, L4-5 Decompression;  Surgeon: Marybelle Killings, MD;  Location: [place];  Service: Orthopedics;  Laterality: N/A;  L3-4, L4-5 Decompression  . NASAL SINUS SURGERY    . PROSTATE SURGERY    . ROTATOR CUFF REPAIR      There were no vitals filed for this visit.  Subjective Assessment - 03/06/18 1458    Subjective  Pt reports that he has not been good about completing his HEP. He has been so busy.    Limitations  Walking    Patient Stated Goals  decrease risk of falling     Currently in Pain?  No/denies                       [facility] Adult PT Treatment/Exercise - 03/06/18 0001      Exercises   Exercises  Knee/Hip      Knee/Hip Exercises: Aerobic   Nustep  L2 x6 min, PT present to discuss importance of HEP adherence      Knee/Hip Exercises: Seated   Long Arc Quad  2 sets;10 reps    Long Arc Quad Weight  2 lbs.    Heel Slides  Strengthening;Both;2 sets;15 reps    Heel Slides Limitations  double yellow TB    Ball Squeeze  x15 reps     Clamshell with TheraBand  --   single leg red TB 2x10 reps each   Marching  Both;2 sets;10 reps    Marching Limitations  alternating     Marching Weights  2 lbs.          Balance Exercises - 03/06/18 1533      Balance Exercises: Standing   Standing Eyes Opened  Narrow base of support (BOS)   trunk rotation x10 reps Lt/Rt    Standing Eyes Closed  Narrow base of support (BOS);2 reps;30 secs;Solid surface    Tandem Stance  Eyes open;Eyes closed;2 reps;30 secs        PT Education - 03/06/18 1504    Education Details  results of BERG and importance of using RW; importance of HEP adherence; technique with therex      Methods  Verbal cues;Demonstration;Explanation    Comprehension  Returned demonstration;Verbalized understanding       PT Short Term Goals - 02/27/18 1327      PT SHORT TERM GOAL #1   Title  Pt will demo consistency and independence with his initial HEP to improve LE strength.     Time  4    Period  Weeks    Status  New    Target Date  03/30/18        PT Long Term Goals - 02/27/18 1327      PT LONG TERM GOAL #1   Title  Pt will have increased BLE strenth to atleast 4/5 MMT which will improve his safety and efficiency with tasks at home.     Time  8    Period  Weeks    Status  New    Target Date  04/29/18      PT LONG TERM GOAL #2   Title  Pt and his caregiver will be able to verbalize atleast 4 ways to decrease his risk of falling at home.    Time  8    Period  Weeks    Status  New      PT LONG TERM GOAL #3   Title  Pt will complete the TUG with LRAD in less than 14 sec to reflect improvements in his balance and stability during functional activity.     Time  8    Period  Weeks    Status  New      PT LONG TERM GOAL #4   Title  Pt will complete 5x sit to stand in less than 14 sec without UE support, to reflect improvements in functional strength and power.     Time  8    Period  Weeks    Status  New      PT LONG TERM GOAL #5   Title  Pt will have a clinically significant improvement in his Berg balance score by atleast 8 points, to decrease his risk of recurrent falls and injury.     Time  8    Period  Weeks    Status  New            Plan - 03/06/18  1545    Clinical Impression Statement  Pt arrived today having reported minimal opportunity to complete his HEP since his evaluation. Session focused on pt education to improve HEP adherence and therex to promote LE strength. Introduced static standing balance this session and pt was able to complete without LOB. Pt requires therapist assistance for exercise technique and safety throughout the session. Pt and  his caregiver verbalized good understanding of HEP additions made this session.     Rehab Potential  Good    PT Frequency  2x / week    PT Duration  8 weeks    PT Treatment/Interventions  ADLs/Self Care Home Management;Gait training;Stair training;Functional mobility training;Patient/family education;Therapeutic exercise;Manual techniques;Passive range of motion;Neuromuscular re-education;Balance training    PT Next Visit Plan  f/u on HEP adherence; LE strengthening; seated balance/reactive exercise; static standing balance    PT Home Exercise Plan  [id number]    Consulted and Agree with Plan of Care  Patient;Family member/caregiver    Family Member Consulted  caregiver        Patient will benefit from skilled therapeutic intervention in order to improve the following deficits and impairments:  Abnormal gait, Decreased activity tolerance, Decreased strength, Impaired flexibility, Improper body mechanics, Decreased range of motion, Decreased endurance, Decreased balance, Difficulty walking  Visit Diagnosis: History of falling  Muscle weakness (generalized)  Unsteadiness on feet     Problem List Patient Active Problem List   Diagnosis Date Noted  . Long-term (current) use of anticoagulants 02/21/2015  . Allergic rhinitis 01/24/2015  . Malnutrition of moderate degree ([place]) 01/16/2015  . Warfarin-induced coagulopathy ([place])   . Melena   . Severe anemia 01/15/2015  . GI bleed 01/15/2015  . Acute blood loss anemia 01/15/2015  . Chronic anticoagulation 06/04/2014  . Dementia 04/19/2014  . Chronic diastolic heart failure ([place]) 04/15/2014  . Chronic kidney disease (CKD), stage III (moderate) ([place]) 04/15/2014  . Pulmonary embolism ([place]) 04/13/2014  . Spinal stenosis, lumbar region, with neurogenic claudication 02/08/2014    Class: Diagnosis of  . Peripheral arterial disease: History of left carotid endarterectomy 03/15/2013  . Obstructive sleep apnea: Uses CPAP 03/15/2013  . Venous  insufficiency 03/15/2013  . Essential hypertension 03/15/2013  . HLD (hyperlipidemia) 03/15/2013  . Chest pain 07/28/2012  . CAD - CFX DES 7/07, LAD DES 10/08, cath 11/11- medical Rx 07/28/2012      4:26 PM,03/06/18 Sherol Dade PT, DPT [place] [facility] [address] [place], [place], [address] Phone: [phone]   Fax:  [phone]  Name: Larry Huffman MRN: [id number] Date of Birth: Feb 12, 1924

## 2018-03-08 ENCOUNTER — Ambulatory Visit: Payer: Medicare HMO | Admitting: Physical Therapy

## 2018-03-08 ENCOUNTER — Encounter: Payer: Self-pay | Admitting: Physical Therapy

## 2018-03-08 DIAGNOSIS — M6281 Muscle weakness (generalized): Secondary | ICD-10-CM

## 2018-03-08 DIAGNOSIS — R2681 Unsteadiness on feet: Secondary | ICD-10-CM | POA: Diagnosis not present

## 2018-03-08 DIAGNOSIS — Z9181 History of falling: Secondary | ICD-10-CM

## 2018-03-08 NOTE — Therapy (Signed)
[facility] [address], [place], [place], [address] Phone: [phone]   Fax:  [phone]  Physical Therapy Treatment  Patient Details  Name: Larry Huffman MRN: [id number] Date of Birth: 05-03-1924 Referring Provider: Dineen Kid, MD   Encounter Date: 03/08/2018  PT End of Session - 03/08/18 1412    Visit Number  3    Date for PT Re-Evaluation  04/29/18    Authorization Type  [employer]     Authorization Time Period  02/27/18 to 04/29/18    Authorization - Visit Number  3    Authorization - Number of Visits  10    PT Start Time  3614    PT Stop Time  4315    PT Time Calculation (min)  38 min    Activity Tolerance  No increased pain;Patient tolerated treatment well    Behavior During Therapy  [facility] for tasks assessed/performed       Past Medical History:  Diagnosis Date  . Acid reflux   . Arthritis   . CAD (coronary artery disease)   . Cancer (HCC) hx of skin cancer  . Constipation   . Coronary artery disease   . High cholesterol   . HOH (hard of hearing)    wears hearing aids, but still can not hear  . Hypertension   . Incontinence of urine   . Peripheral vascular disease ([place])    Remote left carotid endarterectomy  . Sleep apnea    CPAP    Past Surgical History:  Procedure Laterality Date  . 2-D echocardiogram  03/07/2008   Ejection fraction greater than 55%. Mild concentric left ventricular hypertrophy.LV relaxation. mildly dilated left atrium. Mild mitral regurgitation.   . ANAL FISSURE REPAIR    . CARDIAC CATHETERIZATION     X 2 stents  . COLON SURGERY     removed part of the small intestine  . COLONOSCOPY W/ POLYPECTOMY    . CORONARY STENT PLACEMENT    . ESOPHAGOGASTRODUODENOSCOPY N/A 01/16/2015   Procedure: ESOPHAGOGASTRODUODENOSCOPY (EGD);  Surgeon: Irene Shipper, MD;  Location: [facility] ENDOSCOPY;  Service: Endoscopy;  Laterality: N/A;  . EYE SURGERY     cataracts  . I&D EXTREMITY   06/28/2012   Procedure: IRRIGATION AND DEBRIDEMENT EXTREMITY;  Surgeon: Newt Minion, MD;  Location: [place];  Service: Orthopedics;  Laterality: Right;  Debridement Wound Right Leg, VAC, Antibiotic Beads, Apply A-Cell  . IRRIGATION AND DEBRIDEMENT ABSCESS    . LAPAROTOMY    . LUMBAR LAMINECTOMY N/A 02/08/2014   Procedure: L3-4, L4-5 Decompression;  Surgeon: Marybelle Killings, MD;  Location: [place];  Service: Orthopedics;  Laterality: N/A;  L3-4, L4-5 Decompression  . NASAL SINUS SURGERY    . PROSTATE SURGERY    . ROTATOR CUFF REPAIR      There were no vitals filed for this visit.  Subjective Assessment - 03/08/18 1407    Subjective  Pt reports that he did not have time to do his exercises because he was in the hospital with his son.     Limitations  Walking    Patient Stated Goals  decrease risk of falling     Currently in Pain?  No/denies                       [facility] Adult PT Treatment/Exercise - 03/08/18 0001      Exercises   Exercises  Knee/Hip      Knee/Hip Exercises:  Seated   Long Arc Quad  Both;2 sets;10 reps    Long Arc Quad Weight  4 lbs.    Ball Squeeze  x15 reps     Clamshell with Marga Hoots   2x15 reps   Marching  Both;2 sets;15 reps    Marching Limitations  alternating     Marching Weights  2 lbs.      Knee/Hip Exercises: Supine   Straight Leg Raises  2 sets;10 reps;Both    Straight Leg Raises Limitations  cues to maintain knee extension                   Balance Exercises - 03/08/18 1429      Balance Exercises: Seated   Dynamic Sitting  Eyes opened;No upper extremity support;No lower extremity support;Other (comment)   cone rotation x3 trials each direction    Other Seated Exercises  seated reaching outside BOS forward and lateral directions with BUE 2x10 reps     Other Seated Exercises Comments  Catching beach ball in various directions 2x10 reps for reactive movement, seated on dyna disc        PT Education - 03/08/18 1449     Education Details  technique with therex; importance of HEP adherence    Person(s) Educated  Patient    Methods  Explanation    Comprehension  Verbalized understanding       PT Short Term Goals - 02/27/18 1327      PT SHORT TERM GOAL #1   Title  Pt will demo consistency and independence with his initial HEP to improve LE strength.     Time  4    Period  Weeks    Status  New    Target Date  03/30/18        PT Long Term Goals - 02/27/18 1327      PT LONG TERM GOAL #1   Title  Pt will have increased BLE strenth to atleast 4/5 MMT which will improve his safety and efficiency with tasks at home.     Time  8    Period  Weeks    Status  New    Target Date  04/29/18      PT LONG TERM GOAL #2   Title  Pt and his caregiver will be able to verbalize atleast 4 ways to decrease his risk of falling at home.    Time  8    Period  Weeks    Status  New      PT LONG TERM GOAL #3   Title  Pt will complete the TUG with LRAD in less than 14 sec to reflect improvements in his balance and stability during functional activity.     Time  8    Period  Weeks    Status  New      PT LONG TERM GOAL #4   Title  Pt will complete 5x sit to stand in less than 14 sec without UE support, to reflect improvements in functional strength and power.     Time  8    Period  Weeks    Status  New      PT LONG TERM GOAL #5   Title  Pt will have a clinically significant improvement in his Berg balance score by atleast 8 points, to decrease his risk of recurrent falls and injury.     Time  8    Period  Weeks    Status  New  Plan - 03/08/18 1444    Clinical Impression Statement  Pt still needs encouragement to complete his HEP. Session focused on therex progressions to improve LE strength and endurance. Also introduced seated cone reach and reactive exercises, with the pt requiring close supervision and 1 time MinA to prevent LOB. Ended session with minimal LE fatigue and therapist discussed  benefits of HEP adherence moving forward.     Rehab Potential  Good    PT Frequency  2x / week    PT Duration  8 weeks    PT Treatment/Interventions  ADLs/Self Care Home Management;Gait training;Stair training;Functional mobility training;Patient/family education;Therapeutic exercise;Manual techniques;Passive range of motion;Neuromuscular re-education;Balance training    PT Next Visit Plan  f/u on HEP adherence; LE strengthening; seated balance/reactive exercise; static standing balance    PT Home Exercise Plan  [id number]    Consulted and Agree with Plan of Care  Patient;Family member/caregiver    Family Member Consulted  caregiver        Patient will benefit from skilled therapeutic intervention in order to improve the following deficits and impairments:  Abnormal gait, Decreased activity tolerance, Decreased strength, Impaired flexibility, Improper body mechanics, Decreased range of motion, Decreased endurance, Decreased balance, Difficulty walking  Visit Diagnosis: History of falling  Muscle weakness (generalized)  Unsteadiness on feet     Problem List Patient Active Problem List   Diagnosis Date Noted  . Long-term (current) use of anticoagulants 02/21/2015  . Allergic rhinitis 01/24/2015  . Malnutrition of moderate degree ([place]) 01/16/2015  . Warfarin-induced coagulopathy ([place])   . Melena   . Severe anemia 01/15/2015  . GI bleed 01/15/2015  . Acute blood loss anemia 01/15/2015  . Chronic anticoagulation 06/04/2014  . Dementia 04/19/2014  . Chronic diastolic heart failure ([place]) 04/15/2014  . Chronic kidney disease (CKD), stage III (moderate) ([place]) 04/15/2014  . Pulmonary embolism ([place]) 04/13/2014  . Spinal stenosis, lumbar region, with neurogenic claudication 02/08/2014    Class: Diagnosis of  . Peripheral arterial disease: History of left carotid endarterectomy 03/15/2013  . Obstructive sleep apnea: Uses CPAP 03/15/2013  . Venous insufficiency 03/15/2013  . Essential  hypertension 03/15/2013  . HLD (hyperlipidemia) 03/15/2013  . Chest pain 07/28/2012  . CAD - CFX DES 7/07, LAD DES 10/08, cath 11/11- medical Rx 07/28/2012    2:49 PM,03/08/18 Sherol Dade PT, DPT [place] [facility] [address], [place], [place], [address] Phone: [phone]   Fax:  [phone]  Name: Larry Huffman MRN: [id number] Date of Birth: 09/29/1923

## 2018-03-16 ENCOUNTER — Ambulatory Visit: Payer: Medicare HMO | Admitting: Physical Therapy

## 2018-03-21 ENCOUNTER — Encounter: Payer: Medicare HMO | Admitting: Physical Therapy

## 2018-04-05 ENCOUNTER — Encounter

## 2018-04-06 ENCOUNTER — Encounter: Payer: Self-pay | Admitting: Physical Therapy

## 2018-04-06 ENCOUNTER — Ambulatory Visit: Payer: Medicare HMO | Attending: Family Medicine | Admitting: Physical Therapy

## 2018-04-06 DIAGNOSIS — Z9181 History of falling: Secondary | ICD-10-CM | POA: Diagnosis not present

## 2018-04-06 DIAGNOSIS — R2681 Unsteadiness on feet: Secondary | ICD-10-CM | POA: Insufficient documentation

## 2018-04-06 DIAGNOSIS — M6281 Muscle weakness (generalized): Secondary | ICD-10-CM | POA: Diagnosis not present

## 2018-04-06 NOTE — Therapy (Signed)
[facility] [address] [place], [place], [address] Phone: [phone]   Fax:  [phone]  Physical Therapy Treatment  Patient Details  Name: Larry Huffman MRN: [id number] Date of Birth: 1923-10-01 Referring Provider (PT): Dineen Kid, MD   Encounter Date: 04/06/2018  PT End of Session - 04/06/18 1319    Visit Number  4    Date for PT Re-Evaluation  04/29/18    Authorization Type  [employer]     Authorization Time Period  02/27/18 to 04/29/18    Authorization - Visit Number  4    Authorization - Number of Visits  10    PT Start Time  7858    PT Stop Time  1311    PT Time Calculation (min)  38 min    Activity Tolerance  No increased pain;Patient tolerated treatment well    Behavior During Therapy  [facility] for tasks assessed/performed       Past Medical History:  Diagnosis Date  . Acid reflux   . Arthritis   . CAD (coronary artery disease)   . Cancer (HCC) hx of skin cancer  . Constipation   . Coronary artery disease   . High cholesterol   . HOH (hard of hearing)    wears hearing aids, but still can not hear  . Hypertension   . Incontinence of urine   . Peripheral vascular disease ([place])    Remote left carotid endarterectomy  . Sleep apnea    CPAP    Past Surgical History:  Procedure Laterality Date  . 2-D echocardiogram  03/07/2008   Ejection fraction greater than 55%. Mild concentric left ventricular hypertrophy.LV relaxation. mildly dilated left atrium. Mild mitral regurgitation.   . ANAL FISSURE REPAIR    . CARDIAC CATHETERIZATION     X 2 stents  . COLON SURGERY     removed part of the small intestine  . COLONOSCOPY W/ POLYPECTOMY    . CORONARY STENT PLACEMENT    . ESOPHAGOGASTRODUODENOSCOPY N/A 01/16/2015   Procedure: ESOPHAGOGASTRODUODENOSCOPY (EGD);  Surgeon: Irene Shipper, MD;  Location: [facility] ENDOSCOPY;  Service: Endoscopy;  Laterality: N/A;  . EYE SURGERY     cataracts  . I&D EXTREMITY   06/28/2012   Procedure: IRRIGATION AND DEBRIDEMENT EXTREMITY;  Surgeon: Newt Minion, MD;  Location: [place];  Service: Orthopedics;  Laterality: Right;  Debridement Wound Right Leg, VAC, Antibiotic Beads, Apply A-Cell  . IRRIGATION AND DEBRIDEMENT ABSCESS    . LAPAROTOMY    . LUMBAR LAMINECTOMY N/A 02/08/2014   Procedure: L3-4, L4-5 Decompression;  Surgeon: Marybelle Killings, MD;  Location: [place];  Service: Orthopedics;  Laterality: N/A;  L3-4, L4-5 Decompression  . NASAL SINUS SURGERY    . PROSTATE SURGERY    . ROTATOR CUFF REPAIR      There were no vitals filed for this visit.  Subjective Assessment - 04/06/18 1236    Subjective  Pt reports that he continues to have multiple falls. His finger is almost healed. He has been completing his HEP but is not sure where is paper is.     Limitations  Walking    Patient Stated Goals  decrease risk of falling     Currently in Pain?  No/denies                       [facility] Adult PT Treatment/Exercise - 04/06/18 0001      Exercises   Exercises  Knee/Hip      Knee/Hip Exercises: Aerobic   Nustep  L1 x7 min, PT present to discuss progress and POC      Knee/Hip Exercises: Standing   Heel Raises  1 set;20 reps;Both    Hip Flexion  Both;2 sets;15 reps    Hip Flexion Limitations  BUE support       Knee/Hip Exercises: Seated   Long Arc Quad  Both;2 sets;15 reps    Long Arc Quad Weight  4 lbs.    Heel Slides  Strengthening;Both;2 sets;15 reps    Heel Slides Limitations  double green TB     Ball Squeeze  x20 reps     Clamshell with TheraBand  Green   2x20   Marching  Both;2 sets;15 reps    Marching Limitations  alternating     Marching Weights  2 lbs.             PT Education - 04/06/18 1319    Education Details  encouraged pt to pick back up on exercises    Person(s) Educated  Patient    Methods  Explanation    Comprehension  Verbalized understanding       PT Short Term Goals - 04/06/18 1321      PT SHORT TERM GOAL  #1   Title  Pt will demo consistency and independence with his initial HEP to improve LE strength.     Time  4    Period  Weeks    Status  On-going        PT Long Term Goals - 02/27/18 1327      PT LONG TERM GOAL #1   Title  Pt will have increased BLE strenth to atleast 4/5 MMT which will improve his safety and efficiency with tasks at home.     Time  8    Period  Weeks    Status  New    Target Date  04/29/18      PT LONG TERM GOAL #2   Title  Pt and his caregiver will be able to verbalize atleast 4 ways to decrease his risk of falling at home.    Time  8    Period  Weeks    Status  New      PT LONG TERM GOAL #3   Title  Pt will complete the TUG with LRAD in less than 14 sec to reflect improvements in his balance and stability during functional activity.     Time  8    Period  Weeks    Status  New      PT LONG TERM GOAL #4   Title  Pt will complete 5x sit to stand in less than 14 sec without UE support, to reflect improvements in functional strength and power.     Time  8    Period  Weeks    Status  New      PT LONG TERM GOAL #5   Title  Pt will have a clinically significant improvement in his Berg balance score by atleast 8 points, to decrease his risk of recurrent falls and injury.     Time  8    Period  Weeks    Status  New            Plan - 04/06/18 1319    Clinical Impression Statement  Pt arrived today after 4 weeks of missed therapy due to a series of falls. He reports working on his HEP,  however pt was not able to recall coming to this location prior to this session. Focused on therex to improve LE strength and endurance. Pt was able to complete all exercises without significant difficulty, requiring therapist cuing throughout the session. Will continue to follow up with pt and progress therex and balance activity as able, to decrease risk of falling at home.     Rehab Potential  Good    PT Frequency  2x / week    PT Duration  8 weeks    PT  Treatment/Interventions  ADLs/Self Care Home Management;Gait training;Stair training;Functional mobility training;Patient/family education;Therapeutic exercise;Manual techniques;Passive range of motion;Neuromuscular re-education;Balance training    PT Next Visit Plan  f/u on HEP adherence; LE strengthening; seated balance/reactive exercise; static standing balance    PT Home Exercise Plan  [id number]    Consulted and Agree with Plan of Care  Patient;Family member/caregiver    Family Member Consulted  caregiver        Patient will benefit from skilled therapeutic intervention in order to improve the following deficits and impairments:  Abnormal gait, Decreased activity tolerance, Decreased strength, Impaired flexibility, Improper body mechanics, Decreased range of motion, Decreased endurance, Decreased balance, Difficulty walking  Visit Diagnosis: History of falling  Muscle weakness (generalized)  Unsteadiness on feet     Problem List Patient Active Problem List   Diagnosis Date Noted  . Long-term (current) use of anticoagulants 02/21/2015  . Allergic rhinitis 01/24/2015  . Malnutrition of moderate degree ([place]) 01/16/2015  . Warfarin-induced coagulopathy ([place])   . Melena   . Severe anemia 01/15/2015  . GI bleed 01/15/2015  . Acute blood loss anemia 01/15/2015  . Chronic anticoagulation 06/04/2014  . Dementia 04/19/2014  . Chronic diastolic heart failure ([place]) 04/15/2014  . Chronic kidney disease (CKD), stage III (moderate) ([place]) 04/15/2014  . Pulmonary embolism ([place]) 04/13/2014  . Spinal stenosis, lumbar region, with neurogenic claudication 02/08/2014    Class: Diagnosis of  . Peripheral arterial disease: History of left carotid endarterectomy 03/15/2013  . Obstructive sleep apnea: Uses CPAP 03/15/2013  . Venous insufficiency 03/15/2013  . Essential hypertension 03/15/2013  . HLD (hyperlipidemia) 03/15/2013  . Chest pain 07/28/2012  . CAD - CFX DES 7/07, LAD DES 10/08, cath  11/11- medical Rx 07/28/2012     1:28 PM,04/06/18 Sherol Dade PT, DPT [place] [facility] [address] [place], [place], [address] Phone: [phone]   Fax:  [phone]  Name: Larry Huffman MRN: [id number] Date of Birth: Oct 25, 1923

## 2018-04-11 ENCOUNTER — Ambulatory Visit: Payer: Medicare HMO | Admitting: Physical Therapy

## 2018-04-13 ENCOUNTER — Encounter: Payer: Self-pay | Admitting: Physical Therapy

## 2018-04-13 ENCOUNTER — Ambulatory Visit: Payer: Medicare HMO | Attending: Family Medicine | Admitting: Physical Therapy

## 2018-04-13 DIAGNOSIS — M6281 Muscle weakness (generalized): Secondary | ICD-10-CM | POA: Diagnosis not present

## 2018-04-13 DIAGNOSIS — Z9181 History of falling: Secondary | ICD-10-CM | POA: Diagnosis not present

## 2018-04-13 DIAGNOSIS — R2681 Unsteadiness on feet: Secondary | ICD-10-CM

## 2018-04-13 NOTE — Therapy (Signed)
[facility] [address], [place], [place], [address] Phone: [phone]   Fax:  [phone]  Physical Therapy Treatment  Patient Details  Name: Larry Huffman MRN: [id number] Date of Birth: 08/30/1923 Referring Provider (PT): Dineen Kid, MD   Encounter Date: 04/13/2018  PT End of Session - 04/13/18 1405    Visit Number  5    Date for PT Re-Evaluation  04/29/18    Authorization Type  [employer]     Authorization Time Period  02/27/18 to 04/29/18    Authorization - Visit Number  5    Authorization - Number of Visits  10    PT Start Time  0814    PT Stop Time  4818    PT Time Calculation (min)  40 min    Activity Tolerance  No increased pain;Patient tolerated treatment well    Behavior During Therapy  [facility] for tasks assessed/performed       Past Medical History:  Diagnosis Date  . Acid reflux   . Arthritis   . CAD (coronary artery disease)   . Cancer (HCC) hx of skin cancer  . Constipation   . Coronary artery disease   . High cholesterol   . HOH (hard of hearing)    wears hearing aids, but still can not hear  . Hypertension   . Incontinence of urine   . Peripheral vascular disease ([place])    Remote left carotid endarterectomy  . Sleep apnea    CPAP    Past Surgical History:  Procedure Laterality Date  . 2-D echocardiogram  03/07/2008   Ejection fraction greater than 55%. Mild concentric left ventricular hypertrophy.LV relaxation. mildly dilated left atrium. Mild mitral regurgitation.   . ANAL FISSURE REPAIR    . CARDIAC CATHETERIZATION     X 2 stents  . COLON SURGERY     removed part of the small intestine  . COLONOSCOPY W/ POLYPECTOMY    . CORONARY STENT PLACEMENT    . ESOPHAGOGASTRODUODENOSCOPY N/A 01/16/2015   Procedure: ESOPHAGOGASTRODUODENOSCOPY (EGD);  Surgeon: Irene Shipper, MD;  Location: [facility] ENDOSCOPY;  Service: Endoscopy;  Laterality: N/A;  . EYE SURGERY     cataracts  . I&D EXTREMITY   06/28/2012   Procedure: IRRIGATION AND DEBRIDEMENT EXTREMITY;  Surgeon: Newt Minion, MD;  Location: [facility];  Service: Orthopedics;  Laterality: Right;  Debridement Wound Right Leg, VAC, Antibiotic Beads, Apply A-Cell  . IRRIGATION AND DEBRIDEMENT ABSCESS    . LAPAROTOMY    . LUMBAR LAMINECTOMY N/A 02/08/2014   Procedure: L3-4, L4-5 Decompression;  Surgeon: Marybelle Killings, MD;  Location: [place];  Service: Orthopedics;  Laterality: N/A;  L3-4, L4-5 Decompression  . NASAL SINUS SURGERY    . PROSTATE SURGERY    . ROTATOR CUFF REPAIR      There were no vitals filed for this visit.  Subjective Assessment - 04/13/18 1238    Subjective  Pt reports that things are going well. He is not sure how often he does his exercises because his memory is bad.     Limitations  Walking    Patient Stated Goals  decrease risk of falling     Currently in Pain?  No/denies         [facility] PT Assessment - 04/13/18 0001      Assessment   Medical Diagnosis  repeated falls     Referring Provider (PT)  Dineen Kid, MD    Prior Therapy  none  for balance       Precautions   Precautions  Fall      Restrictions   Weight Bearing Restrictions  No      Balance Screen   Has the patient fallen in the past 6 months  Yes    How many times?  alot of falls per pt and caregiver report    Has the patient had a decrease in activity level because of a fear of falling?   Yes    Is the patient reluctant to leave their home because of a fear of falling?   Yes      [place] residence    Additional Comments  pt lives with his daughter and has a caregiver during the week days       Prior Function   Level of Independence  Independent      Cognition   Overall Cognitive Status  Within Functional Limits for tasks assessed      Strength   Right Hip Flexion  3/5    Right Hip Extension  3/5    Right Hip ABduction  3/5    Left Hip Flexion  3/5    Left Hip Extension  3/5    Left Hip ABduction   3/5    Right Knee Extension  5/5    Left Knee Extension  5/5    Right Ankle Dorsiflexion  4/5    Left Ankle Dorsiflexion  4/5      Transfers   Five time sit to stand comments   25 sec, no UE      Ambulation/Gait   Gait Comments  decreased step length, flexed posture      Standardized Balance Assessment   Standardized Balance Assessment  Timed Up and Go Test;Berg Balance Test      Berg Balance Test   Sit to Stand  Able to stand  independently using hands    Standing Unsupported  Able to stand 2 minutes with supervision    Sitting with Back Unsupported but Feet Supported on Floor or Stool  Able to sit safely and securely 2 minutes    Stand to Sit  Controls descent by using hands    Transfers  Able to transfer with verbal cueing and /or supervision    Standing Unsupported with Eyes Closed  Able to stand 10 seconds with supervision    Standing Ubsupported with Feet Together  Able to place feet together independently and stand for 1 minute with supervision    From Standing, Reach Forward with Outstretched Arm  Reaches forward but needs supervision    From Standing Position, Pick up Object from Floor  Able to pick up shoe, needs supervision    From Standing Position, Turn to Look Behind Over each Shoulder  Needs supervision when turning    Turn 360 Degrees  Able to turn 360 degrees safely but slowly    Standing Unsupported, Alternately Place Feet on Step/Stool  Able to complete >2 steps/needs minimal assist    Standing Unsupported, One Foot in Front  Able to take small step independently and hold 30 seconds    Standing on One Leg  Tries to lift leg/unable to hold 3 seconds but remains standing independently    Total Score  32      Timed Up and Go Test   TUG Comments  28 sec, SPC with close supervision  [facility] Adult PT Treatment/Exercise - 04/13/18 0001      Self-Care   Self-Care  Other Self-Care Comments    Other Self-Care Comments   importance of RW  and HEP adherence moving forward       Exercises   Other Exercises   therex reviewed with caregiver              PT Education - 04/13/18 1443    Education Details  BERG balance findings; RW vs SPC; HEP reviewed with pt and caregiver; POC moving forward     Person(s) Educated  Theatre stage manager);Patient    Methods  Explanation;Handout;Demonstration    Comprehension  Verbalized understanding       PT Short Term Goals - 04/06/18 1321      PT SHORT TERM GOAL #1   Title  Pt will demo consistency and independence with his initial HEP to improve LE strength.     Time  4    Period  Weeks    Status  On-going        PT Long Term Goals - 04/13/18 1312      PT LONG TERM GOAL #1   Title  Pt will have increased BLE strenth to atleast 4/5 MMT which will improve his safety and efficiency with tasks at home.     Baseline  ankle DF increased to 4/5 MMT, Rt knee extension 5/5 MMT    Time  8    Period  Weeks    Status  New      PT LONG TERM GOAL #2   Title  Pt and his caregiver will be able to verbalize atleast 4 ways to decrease his risk of falling at home.    Baseline  discussed this session with pt and caregiver    Time  8    Period  Weeks    Status  Partially Met      PT LONG TERM GOAL #3   Title  Pt will complete the TUG with LRAD in less than 14 sec to reflect improvements in his balance and stability during functional activity.     Baseline  28 sec, SPC and close supervision     Time  8    Period  Weeks    Status  On-going      PT LONG TERM GOAL #4   Title  Pt will complete 5x sit to stand in less than 14 sec without UE support, to reflect improvements in functional strength and power.     Time  8    Period  Weeks    Status  On-going      PT LONG TERM GOAL #5   Title  Pt will have a clinically significant improvement in his Berg balance score by atleast 8 points, to decrease his risk of recurrent falls and injury.     Baseline  1 point improvement     Time  8    Period   Weeks    Status  On-going            Plan - 04/13/18 1406    Clinical Impression Statement  Pt's progress was reassessed this visit secondary to recent increase in falls and the gap in treatment. His BERG balance score improved by 1 point up to 32/56, however his TUG remains the same at 28 sec. Pt's currently using a RW despite therapist's recommendation, so a discussion was had with the pt and his caregiver regarding his high risk of falling at  home or in the community. Pt has maintained his level of function despite missing 4 weeks of PT, but his HEP adherence is not at an optimal level. He would continue to benefit from skilled PT to educate pt and caregiver on safety around the home, instruct proper use of RW and progress HEP to improve strength/proprioception moving forward.    Rehab Potential  Good    PT Frequency  2x / week    PT Duration  8 weeks    PT Treatment/Interventions  ADLs/Self Care Home Management;Gait training;Stair training;Functional mobility training;Patient/family education;Therapeutic exercise;Manual techniques;Passive range of motion;Neuromuscular re-education;Balance training    PT Next Visit Plan  f/u on HEP adherence; safe measures at the home (rugs, night lights, etc); seated balance activity with caregiver; RW outside?     PT Home Exercise Plan  [id number]    Consulted and Agree with Plan of Care  Patient;Family member/caregiver    Family Member Consulted  caregiver        Patient will benefit from skilled therapeutic intervention in order to improve the following deficits and impairments:  Abnormal gait, Decreased activity tolerance, Decreased strength, Impaired flexibility, Improper body mechanics, Decreased range of motion, Decreased endurance, Decreased balance, Difficulty walking  Visit Diagnosis: History of falling  Muscle weakness (generalized)  Unsteadiness on feet     Problem List Patient Active Problem List   Diagnosis Date Noted  .  Long-term (current) use of anticoagulants 02/21/2015  . Allergic rhinitis 01/24/2015  . Malnutrition of moderate degree ([place]) 01/16/2015  . Warfarin-induced coagulopathy ([place])   . Melena   . Severe anemia 01/15/2015  . GI bleed 01/15/2015  . Acute blood loss anemia 01/15/2015  . Chronic anticoagulation 06/04/2014  . Dementia ([place]) 04/19/2014  . Chronic diastolic heart failure ([place]) 04/15/2014  . Chronic kidney disease (CKD), stage III (moderate) ([place]) 04/15/2014  . Pulmonary embolism ([place]) 04/13/2014  . Spinal stenosis, lumbar region, with neurogenic claudication 02/08/2014    Class: Diagnosis of  . Peripheral arterial disease: History of left carotid endarterectomy 03/15/2013  . Obstructive sleep apnea: Uses CPAP 03/15/2013  . Venous insufficiency 03/15/2013  . Essential hypertension 03/15/2013  . HLD (hyperlipidemia) 03/15/2013  . Chest pain 07/28/2012  . CAD - CFX DES 7/07, LAD DES 10/08, cath 11/11- medical Rx 07/28/2012      2:44 PM,04/13/18 Sherol Dade PT, DPT [facility] [address], [place], [place], [address] Phone: [phone]   Fax:  [phone]  Name: THIERRY DOBOSZ MRN: [id number] Date of Birth: 08/24/1923

## 2018-04-13 NOTE — Patient Instructions (Signed)
Access Code: [email or web address]  URL: [email or web address]  Date: 04/13/2018  Prepared by: Sherol Dade   Exercises  Heel Raises with Gilford Rile and Chair - 15 reps - 2 sets - 2-3x daily - 7x weekly  Sit to Stand - 10 reps - 2 sets - 2-3x daily - 7x weekly  Seated Hip Abduction - 10 reps - 3 sets - 2-3x daily - 7x weekly  Seated Hip Adduction Isometrics with Diona Foley - 10 reps - 2-3x daily - 7x weekly    [facility] [address], [place], [place] [address] Phone # [phone] Fax [phone]

## 2018-04-18 ENCOUNTER — Ambulatory Visit: Payer: Medicare HMO | Admitting: Physical Therapy

## 2018-04-18 ENCOUNTER — Encounter: Payer: Self-pay | Admitting: Physical Therapy

## 2018-04-18 DIAGNOSIS — R2681 Unsteadiness on feet: Secondary | ICD-10-CM

## 2018-04-18 DIAGNOSIS — M6281 Muscle weakness (generalized): Secondary | ICD-10-CM | POA: Diagnosis not present

## 2018-04-18 DIAGNOSIS — Z9181 History of falling: Secondary | ICD-10-CM

## 2018-04-18 NOTE — Therapy (Signed)
[facility] [address], [place], [place], [address] Phone: [phone]   Fax:  [phone]  Physical Therapy Treatment  Patient Details  Name: Larry Huffman MRN: [id number] Date of Birth: 01/25/1924 Referring Provider (PT): Dineen Kid, MD   Encounter Date: 04/18/2018  PT End of Session - 04/18/18 1709    Visit Number  6    Date for PT Re-Evaluation  04/29/18    Authorization Type  [employer]     Authorization Time Period  02/27/18 to 04/29/18    Authorization - Visit Number  6    Authorization - Number of Visits  10    PT Start Time  8466    PT Stop Time  1611    PT Time Calculation (min)  38 min    Activity Tolerance  No increased pain;Patient tolerated treatment well    Behavior During Therapy  [facility] for tasks assessed/performed       Past Medical History:  Diagnosis Date  . Acid reflux   . Arthritis   . CAD (coronary artery disease)   . Cancer (HCC) hx of skin cancer  . Constipation   . Coronary artery disease   . High cholesterol   . HOH (hard of hearing)    wears hearing aids, but still can not hear  . Hypertension   . Incontinence of urine   . Peripheral vascular disease ([place])    Remote left carotid endarterectomy  . Sleep apnea    CPAP    Past Surgical History:  Procedure Laterality Date  . 2-D echocardiogram  03/07/2008   Ejection fraction greater than 55%. Mild concentric left ventricular hypertrophy.LV relaxation. mildly dilated left atrium. Mild mitral regurgitation.   . ANAL FISSURE REPAIR    . CARDIAC CATHETERIZATION     X 2 stents  . COLON SURGERY     removed part of the small intestine  . COLONOSCOPY W/ POLYPECTOMY    . CORONARY STENT PLACEMENT    . ESOPHAGOGASTRODUODENOSCOPY N/A 01/16/2015   Procedure: ESOPHAGOGASTRODUODENOSCOPY (EGD);  Surgeon: Irene Shipper, MD;  Location: [facility] ENDOSCOPY;  Service: Endoscopy;  Laterality: N/A;  . EYE SURGERY     cataracts  . I&D EXTREMITY   06/28/2012   Procedure: IRRIGATION AND DEBRIDEMENT EXTREMITY;  Surgeon: Newt Minion, MD;  Location: [place];  Service: Orthopedics;  Laterality: Right;  Debridement Wound Right Leg, VAC, Antibiotic Beads, Apply A-Cell  . IRRIGATION AND DEBRIDEMENT ABSCESS    . LAPAROTOMY    . LUMBAR LAMINECTOMY N/A 02/08/2014   Procedure: L3-4, L4-5 Decompression;  Surgeon: Marybelle Killings, MD;  Location: [facility];  Service: Orthopedics;  Laterality: N/A;  L3-4, L4-5 Decompression  . NASAL SINUS SURGERY    . PROSTATE SURGERY    . ROTATOR CUFF REPAIR      There were no vitals filed for this visit.  Subjective Assessment - 04/18/18 1544    Subjective  Pt states that he is not completing his HEP as much as he would like. He feels more steady with his RW.    Limitations  Walking    Patient Stated Goals  decrease risk of falling     Currently in Pain?  No/denies                       [facility] Adult PT Treatment/Exercise - 04/18/18 0001      Therapeutic Activites    Therapeutic Activities  Other Therapeutic Activities  Other Therapeutic Activities  walking with RW outdoors on uneven surface x200 ft, therapist discussing safety awareness and curbs during activity       Knee/Hip Exercises: Aerobic   Nustep  L1 x8 min, PT present to discuss progress and POC      Knee/Hip Exercises: Seated   Marching  Both;2 sets;15 reps    Marching Limitations  alternating     Marching Weights  3 lbs.          Balance Exercises - 04/18/18 1707      Balance Exercises: Standing   Tandem Stance  Eyes open;2 reps;Limitations   with pt's caregiver to demo HEP     Seated trunk rotation with UE reach across body 2x10 reps, verbal cues to increase forward trunk shift   PT Education - 04/18/18 1709    Education Details  safety precautions at home; balance activity with caregiver     Person(s) Educated  Patient;Caregiver(s)    Methods  Explanation;Demonstration    Comprehension  Verbalized understanding        PT Short Term Goals - 04/06/18 1321      PT SHORT TERM GOAL #1   Title  Pt will demo consistency and independence with his initial HEP to improve LE strength.     Time  4    Period  Weeks    Status  On-going        PT Long Term Goals - 04/13/18 1312      PT LONG TERM GOAL #1   Title  Pt will have increased BLE strenth to atleast 4/5 MMT which will improve his safety and efficiency with tasks at home.     Baseline  ankle DF increased to 4/5 MMT, Rt knee extension 5/5 MMT    Time  8    Period  Weeks    Status  New      PT LONG TERM GOAL #2   Title  Pt and his caregiver will be able to verbalize atleast 4 ways to decrease his risk of falling at home.    Baseline  discussed this session with pt and caregiver    Time  8    Period  Weeks    Status  Partially Met      PT LONG TERM GOAL #3   Title  Pt will complete the TUG with LRAD in less than 14 sec to reflect improvements in his balance and stability during functional activity.     Baseline  28 sec, SPC and close supervision     Time  8    Period  Weeks    Status  On-going      PT LONG TERM GOAL #4   Title  Pt will complete 5x sit to stand in less than 14 sec without UE support, to reflect improvements in functional strength and power.     Time  8    Period  Weeks    Status  On-going      PT LONG TERM GOAL #5   Title  Pt will have a clinically significant improvement in his Berg balance score by atleast 8 points, to decrease his risk of recurrent falls and injury.     Baseline  1 point improvement     Time  8    Period  Weeks    Status  On-going            Plan - 04/18/18 1710    Clinical Impression Statement  Pt  arrived with his RW demonstrating significantly improved steadiness with ambulation. Therapist educated and assisted pt with ambulation outdoors on uneven surface to ensure that he is using this safely at home. He required CGA and verbal cuing to ensure he did not walk too close to the curb, but  otherwise no LOB was noted. Therapist also reviewed static balance activity in standing and sitting with pt's caregiver for improved safety with this at home. She verbalized understanding of this.     Rehab Potential  Good    PT Frequency  2x / week    PT Duration  8 weeks    PT Treatment/Interventions  ADLs/Self Care Home Management;Gait training;Stair training;Functional mobility training;Patient/family education;Therapeutic exercise;Manual techniques;Passive range of motion;Neuromuscular re-education;Balance training    PT Next Visit Plan  f/u on HEP adherence; standing balance activity; seated balance activity with caregiver    PT Home Exercise Plan  [id number]    Consulted and Agree with Plan of Care  Patient;Family member/caregiver    Family Member Consulted  caregiver        Patient will benefit from skilled therapeutic intervention in order to improve the following deficits and impairments:  Abnormal gait, Decreased activity tolerance, Decreased strength, Impaired flexibility, Improper body mechanics, Decreased range of motion, Decreased endurance, Decreased balance, Difficulty walking  Visit Diagnosis: History of falling  Muscle weakness (generalized)  Unsteadiness on feet     Problem List Patient Active Problem List   Diagnosis Date Noted  . Long-term (current) use of anticoagulants 02/21/2015  . Allergic rhinitis 01/24/2015  . Malnutrition of moderate degree ([place]) 01/16/2015  . Warfarin-induced coagulopathy ([place])   . Melena   . Severe anemia 01/15/2015  . GI bleed 01/15/2015  . Acute blood loss anemia 01/15/2015  . Chronic anticoagulation 06/04/2014  . Dementia ([place]) 04/19/2014  . Chronic diastolic heart failure ([place]) 04/15/2014  . Chronic kidney disease (CKD), stage III (moderate) ([place]) 04/15/2014  . Pulmonary embolism ([place]) 04/13/2014  . Spinal stenosis, lumbar region, with neurogenic claudication 02/08/2014    Class: Diagnosis of  . Peripheral arterial disease:  History of left carotid endarterectomy 03/15/2013  . Obstructive sleep apnea: Uses CPAP 03/15/2013  . Venous insufficiency 03/15/2013  . Essential hypertension 03/15/2013  . HLD (hyperlipidemia) 03/15/2013  . Chest pain 07/28/2012  . CAD - CFX DES 7/07, LAD DES 10/08, cath 11/11- medical Rx 07/28/2012     5:14 PM,04/18/18 Sherol Dade PT, DPT [place] [facility] [address] [place], [place], [address] Phone: [phone]   Fax:  [phone]  Name: Larry Huffman MRN: [id number] Date of Birth: 10/05/1923

## 2018-04-20 ENCOUNTER — Ambulatory Visit: Payer: Medicare HMO | Admitting: Physical Therapy

## 2018-04-20 ENCOUNTER — Encounter: Payer: Self-pay | Admitting: Physical Therapy

## 2018-04-20 DIAGNOSIS — M6281 Muscle weakness (generalized): Secondary | ICD-10-CM

## 2018-04-20 DIAGNOSIS — R2681 Unsteadiness on feet: Secondary | ICD-10-CM

## 2018-04-20 DIAGNOSIS — Z9181 History of falling: Secondary | ICD-10-CM

## 2018-04-20 NOTE — Therapy (Signed)
[facility] [address], [place], [place], [address] Phone: [phone]   Fax:  [phone]  Physical Therapy Treatment  Patient Details  Name: Larry Huffman MRN: [id number] Date of Birth: [birth year] Referring Provider (PT): Dineen Kid, MD   Encounter Date: 04/20/2018  PT End of Session - 04/20/18 1507    Visit Number  7    Date for PT Re-Evaluation  04/29/18    Authorization Type  [employer]     Authorization Time Period  02/27/18 to 04/29/18    Authorization - Visit Number  7    Authorization - Number of Visits  10    PT Start Time  3734    PT Stop Time  2876    PT Time Calculation (min)  40 min    Activity Tolerance  No increased pain;Patient tolerated treatment well    Behavior During Therapy  [facility] for tasks assessed/performed       Past Medical History:  Diagnosis Date  . Acid reflux   . Arthritis   . CAD (coronary artery disease)   . Cancer (HCC) hx of skin cancer  . Constipation   . Coronary artery disease   . High cholesterol   . HOH (hard of hearing)    wears hearing aids, but still can not hear  . Hypertension   . Incontinence of urine   . Peripheral vascular disease ([place])    Remote left carotid endarterectomy  . Sleep apnea    CPAP    Past Surgical History:  Procedure Laterality Date  . 2-D echocardiogram  03/07/2008   Ejection fraction greater than 55%. Mild concentric left ventricular hypertrophy.LV relaxation. mildly dilated left atrium. Mild mitral regurgitation.   . ANAL FISSURE REPAIR    . CARDIAC CATHETERIZATION     X 2 stents  . COLON SURGERY     removed part of the small intestine  . COLONOSCOPY W/ POLYPECTOMY    . CORONARY STENT PLACEMENT    . ESOPHAGOGASTRODUODENOSCOPY N/A 01/16/2015   Procedure: ESOPHAGOGASTRODUODENOSCOPY (EGD);  Surgeon: Irene Shipper, MD;  Location: [facility] ENDOSCOPY;  Service: Endoscopy;  Laterality: N/A;  . EYE SURGERY     cataracts  . I&D EXTREMITY   06/28/2012   Procedure: IRRIGATION AND DEBRIDEMENT EXTREMITY;  Surgeon: Newt Minion, MD;  Location: [address];  Service: Orthopedics;  Laterality: Right;  Debridement Wound Right Leg, VAC, Antibiotic Beads, Apply A-Cell  . IRRIGATION AND DEBRIDEMENT ABSCESS    . LAPAROTOMY    . LUMBAR LAMINECTOMY N/A 02/08/2014   Procedure: L3-4, L4-5 Decompression;  Surgeon: Marybelle Killings, MD;  Location: [place];  Service: Orthopedics;  Laterality: N/A;  L3-4, L4-5 Decompression  . NASAL SINUS SURGERY    . PROSTATE SURGERY    . ROTATOR CUFF REPAIR      There were no vitals filed for this visit.  Subjective Assessment - 04/20/18 1451    Subjective  Pt reports that things are going well. His caregiver states that he is working on his balance activity with his activity.     Limitations  Walking    Patient Stated Goals  decrease risk of falling     Currently in Pain?  No/denies                       [facility] Adult PT Treatment/Exercise - 04/20/18 0001      Exercises   Exercises  Knee/Hip      Knee/Hip  Exercises: Aerobic   Nustep  L1 x8 min, PT present to encourage SPM above 60      Knee/Hip Exercises: Standing   Hip Abduction  Both;2 sets;10 reps;Knee straight    Abduction Limitations  yellow TB around ankles     Other Standing Knee Exercises  Heel tap on 6" box without UE support, CGA x10 reps each       Knee/Hip Exercises: Seated   Long Arc Quad  Both;Strengthening;2 sets;10 reps    Long Arc Quad Weight  5 lbs.   10 sec extension hold after each    Clamshell with TheraBand  Blue   2x20 reps    Marching  Both;2 sets;15 reps    Marching Weights  5 lbs.             PT Education - 04/20/18 1553    Education Details  technique with therex    Person(s) Educated  Patient    Methods  Explanation;Verbal cues;Tactile cues;Demonstration    Comprehension  Verbalized understanding;Returned demonstration       PT Short Term Goals - 04/06/18 1321      PT SHORT TERM GOAL #1   Title   Pt will demo consistency and independence with his initial HEP to improve LE strength.     Time  4    Period  Weeks    Status  On-going        PT Long Term Goals - 04/13/18 1312      PT LONG TERM GOAL #1   Title  Pt will have increased BLE strenth to atleast 4/5 MMT which will improve his safety and efficiency with tasks at home.     Baseline  ankle DF increased to 4/5 MMT, Rt knee extension 5/5 MMT    Time  8    Period  Weeks    Status  New      PT LONG TERM GOAL #2   Title  Pt and his caregiver will be able to verbalize atleast 4 ways to decrease his risk of falling at home.    Baseline  discussed this session with pt and caregiver    Time  8    Period  Weeks    Status  Partially Met      PT LONG TERM GOAL #3   Title  Pt will complete the TUG with LRAD in less than 14 sec to reflect improvements in his balance and stability during functional activity.     Baseline  28 sec, SPC and close supervision     Time  8    Period  Weeks    Status  On-going      PT LONG TERM GOAL #4   Title  Pt will complete 5x sit to stand in less than 14 sec without UE support, to reflect improvements in functional strength and power.     Time  8    Period  Weeks    Status  On-going      PT LONG TERM GOAL #5   Title  Pt will have a clinically significant improvement in his Berg balance score by atleast 8 points, to decrease his risk of recurrent falls and injury.     Baseline  1 point improvement     Time  8    Period  Weeks    Status  On-going            Plan - 04/20/18 1534    Clinical Impression Statement  Pt has had increase in HEP adherence now that his caregiver is assisting him throughout the day. Session focused on both seated and standing exercise to improve LE strength and endurance. Although pt requires heavy cuing for proper exercise technique, he does have good participation and motivation during his sessions. Will continue with current POC to improve pt strength, endurance  and safety with daily activity.     Rehab Potential  Good    PT Frequency  2x / week    PT Duration  8 weeks    PT Treatment/Interventions  ADLs/Self Care Home Management;Gait training;Stair training;Functional mobility training;Patient/family education;Therapeutic exercise;Manual techniques;Passive range of motion;Neuromuscular re-education;Balance training    PT Next Visit Plan  f/u on HEP adherence; standing balance activity; seated balance activity with caregiver    PT Home Exercise Plan  [id number]    Consulted and Agree with Plan of Care  Patient;Family member/caregiver    Family Member Consulted  caregiver        Patient will benefit from skilled therapeutic intervention in order to improve the following deficits and impairments:  Abnormal gait, Decreased activity tolerance, Decreased strength, Impaired flexibility, Improper body mechanics, Decreased range of motion, Decreased endurance, Decreased balance, Difficulty walking  Visit Diagnosis: History of falling  Unsteadiness on feet  Muscle weakness (generalized)     Problem List Patient Active Problem List   Diagnosis Date Noted  . Long-term (current) use of anticoagulants 02/21/2015  . Allergic rhinitis 01/24/2015  . Malnutrition of moderate degree ([place]) 01/16/2015  . Warfarin-induced coagulopathy ([place])   . Melena   . Severe anemia 01/15/2015  . GI bleed 01/15/2015  . Acute blood loss anemia 01/15/2015  . Chronic anticoagulation 06/04/2014  . Dementia ([place]) 04/19/2014  . Chronic diastolic heart failure ([place]) 04/15/2014  . Chronic kidney disease (CKD), stage III (moderate) ([place]) 04/15/2014  . Pulmonary embolism ([place]) 04/13/2014  . Spinal stenosis, lumbar region, with neurogenic claudication 02/08/2014    Class: Diagnosis of  . Peripheral arterial disease: History of left carotid endarterectomy 03/15/2013  . Obstructive sleep apnea: Uses CPAP 03/15/2013  . Venous insufficiency 03/15/2013  . Essential hypertension  03/15/2013  . HLD (hyperlipidemia) 03/15/2013  . Chest pain 07/28/2012  . CAD - CFX DES 7/07, LAD DES 10/08, cath 11/11- medical Rx 07/28/2012    3:53 PM,04/20/18 Sherol Dade PT, DPT [place] [facility] [address], [place], [place], [address] Phone: [phone]   Fax:  [phone]  Name: Larry Huffman MRN: [id number] Date of Birth: [birth year]

## 2018-04-25 ENCOUNTER — Ambulatory Visit: Payer: Medicare HMO | Admitting: Physical Therapy

## 2018-04-25 ENCOUNTER — Encounter: Payer: Self-pay | Admitting: Physical Therapy

## 2018-04-25 DIAGNOSIS — M6281 Muscle weakness (generalized): Secondary | ICD-10-CM

## 2018-04-25 DIAGNOSIS — R2681 Unsteadiness on feet: Secondary | ICD-10-CM | POA: Diagnosis not present

## 2018-04-25 DIAGNOSIS — Z9181 History of falling: Secondary | ICD-10-CM | POA: Diagnosis not present

## 2018-04-25 NOTE — Therapy (Signed)
[facility] [address] [place], [place], [address] Phone: [phone]   Fax:  [phone]  Physical Therapy Treatment  Patient Details  Name: Larry Huffman MRN: [id number] Date of Birth: 11/17/1923 Referring Provider (PT): Dineen Kid, MD   Encounter Date: 04/25/2018  PT End of Session - 04/25/18 1616    Visit Number  8    Date for PT Re-Evaluation  04/29/18    Authorization Type  [employer]     Authorization Time Period  02/27/18 to 04/29/18    Authorization - Visit Number  8    Authorization - Number of Visits  10    PT Start Time  1975    PT Stop Time  8832    PT Time Calculation (min)  43 min    Activity Tolerance  No increased pain;Patient tolerated treatment well    Behavior During Therapy  [facility] for tasks assessed/performed       Past Medical History:  Diagnosis Date  . Acid reflux   . Arthritis   . CAD (coronary artery disease)   . Cancer (HCC) hx of skin cancer  . Constipation   . Coronary artery disease   . High cholesterol   . HOH (hard of hearing)    wears hearing aids, but still can not hear  . Hypertension   . Incontinence of urine   . Peripheral vascular disease ([place])    Remote left carotid endarterectomy  . Sleep apnea    CPAP    Past Surgical History:  Procedure Laterality Date  . 2-D echocardiogram  03/07/2008   Ejection fraction greater than 55%. Mild concentric left ventricular hypertrophy.LV relaxation. mildly dilated left atrium. Mild mitral regurgitation.   . ANAL FISSURE REPAIR    . CARDIAC CATHETERIZATION     X 2 stents  . COLON SURGERY     removed part of the small intestine  . COLONOSCOPY W/ POLYPECTOMY    . CORONARY STENT PLACEMENT    . ESOPHAGOGASTRODUODENOSCOPY N/A 01/16/2015   Procedure: ESOPHAGOGASTRODUODENOSCOPY (EGD);  Surgeon: Irene Shipper, MD;  Location: [facility] ENDOSCOPY;  Service: Endoscopy;  Laterality: N/A;  . EYE SURGERY     cataracts  . I&D EXTREMITY   06/28/2012   Procedure: IRRIGATION AND DEBRIDEMENT EXTREMITY;  Surgeon: Newt Minion, MD;  Location: [place];  Service: Orthopedics;  Laterality: Right;  Debridement Wound Right Leg, VAC, Antibiotic Beads, Apply A-Cell  . IRRIGATION AND DEBRIDEMENT ABSCESS    . LAPAROTOMY    . LUMBAR LAMINECTOMY N/A 02/08/2014   Procedure: L3-4, L4-5 Decompression;  Surgeon: Marybelle Killings, MD;  Location: [place];  Service: Orthopedics;  Laterality: N/A;  L3-4, L4-5 Decompression  . NASAL SINUS SURGERY    . PROSTATE SURGERY    . ROTATOR CUFF REPAIR      There were no vitals filed for this visit.  Subjective Assessment - 04/25/18 1532    Subjective  Pt has no complaints at this time.     Limitations  Walking    Patient Stated Goals  decrease risk of falling     Currently in Pain?  No/denies                       [facility] Adult PT Treatment/Exercise - 04/25/18 0001      Exercises   Exercises  Knee/Hip      Knee/Hip Exercises: Aerobic   Nustep  x6 min intervals from L1 to L3 every  1 minute       Knee/Hip Exercises: Standing   Other Standing Knee Exercises  alternating LE march 2x10 reps each       Knee/Hip Exercises: Seated   Long Arc Quad  Both;Strengthening;2 sets;20 reps    Long Arc Quad Weight  7 lbs.    Clamshell with TheraBand  --   black band 2x20 reps    Marching  Both;2 sets;15 reps    Marching Weights  7 lbs.    Sit to Sand  10 reps;2 sets   no UE support          Balance Exercises - 04/25/18 1554      Balance Exercises: Standing   Tandem Stance  Eyes open   firm surface with ball toss x10 reps each LE forward    Other Standing Exercises  ball toss standing on foam pad x15 reps         PT Education - 04/25/18 1615    Education Details  improvements in sit to stand; technique with therex    Person(s) Educated  Patient    Methods  Explanation;Verbal cues;Tactile cues    Comprehension  Verbalized understanding;Returned demonstration       PT Short Term Goals  - 04/06/18 1321      PT SHORT TERM GOAL #1   Title  Pt will demo consistency and independence with his initial HEP to improve LE strength.     Time  4    Period  Weeks    Status  On-going        PT Long Term Goals - 04/13/18 1312      PT LONG TERM GOAL #1   Title  Pt will have increased BLE strenth to atleast 4/5 MMT which will improve his safety and efficiency with tasks at home.     Baseline  ankle DF increased to 4/5 MMT, Rt knee extension 5/5 MMT    Time  8    Period  Weeks    Status  New      PT LONG TERM GOAL #2   Title  Pt and his caregiver will be able to verbalize atleast 4 ways to decrease his risk of falling at home.    Baseline  discussed this session with pt and caregiver    Time  8    Period  Weeks    Status  Partially Met      PT LONG TERM GOAL #3   Title  Pt will complete the TUG with LRAD in less than 14 sec to reflect improvements in his balance and stability during functional activity.     Baseline  28 sec, SPC and close supervision     Time  8    Period  Weeks    Status  On-going      PT LONG TERM GOAL #4   Title  Pt will complete 5x sit to stand in less than 14 sec without UE support, to reflect improvements in functional strength and power.     Time  8    Period  Weeks    Status  On-going      PT LONG TERM GOAL #5   Title  Pt will have a clinically significant improvement in his Berg balance score by atleast 8 points, to decrease his risk of recurrent falls and injury.     Baseline  1 point improvement     Time  8    Period  Weeks  Status  On-going            Plan - 04/25/18 1616    Clinical Impression Statement  Pt is making progress towards his goals. His caregiver notes increased HEP adherence when she is with him throughout the week. He demonstrates improved LE strength evident by his ability to complete sit to stand without UE support or LOB. Pt's reassessment is scheduled for next session to determine the need for further PT or  possible d/c with HEP. Pt and his caregiver verbalized understanding of this.     Rehab Potential  Good    PT Frequency  2x / week    PT Duration  8 weeks    PT Treatment/Interventions  ADLs/Self Care Home Management;Gait training;Stair training;Functional mobility training;Patient/family education;Therapeutic exercise;Manual techniques;Passive range of motion;Neuromuscular re-education;Balance training    PT Next Visit Plan  reassessment     PT Home Exercise Plan  [id number]    Consulted and Agree with Plan of Care  Patient;Family member/caregiver    Family Member Consulted  caregiver        Patient will benefit from skilled therapeutic intervention in order to improve the following deficits and impairments:  Abnormal gait, Decreased activity tolerance, Decreased strength, Impaired flexibility, Improper body mechanics, Decreased range of motion, Decreased endurance, Decreased balance, Difficulty walking  Visit Diagnosis: History of falling  Unsteadiness on feet  Muscle weakness (generalized)     Problem List Patient Active Problem List   Diagnosis Date Noted  . Long-term (current) use of anticoagulants 02/21/2015  . Allergic rhinitis 01/24/2015  . Malnutrition of moderate degree ([place]) 01/16/2015  . Warfarin-induced coagulopathy ([place])   . Melena   . Severe anemia 01/15/2015  . GI bleed 01/15/2015  . Acute blood loss anemia 01/15/2015  . Chronic anticoagulation 06/04/2014  . Dementia ([place]) 04/19/2014  . Chronic diastolic heart failure ([place]) 04/15/2014  . Chronic kidney disease (CKD), stage III (moderate) ([place]) 04/15/2014  . Pulmonary embolism ([place]) 04/13/2014  . Spinal stenosis, lumbar region, with neurogenic claudication 02/08/2014    Class: Diagnosis of  . Peripheral arterial disease: History of left carotid endarterectomy 03/15/2013  . Obstructive sleep apnea: Uses CPAP 03/15/2013  . Venous insufficiency 03/15/2013  . Essential hypertension 03/15/2013  . HLD  (hyperlipidemia) 03/15/2013  . Chest pain 07/28/2012  . CAD - CFX DES 7/07, LAD DES 10/08, cath 11/11- medical Rx 07/28/2012   4:20 PM,04/25/18 Sherol Dade PT, DPT [place] [facility] [address] [place], [place], [address] Phone: [phone]   Fax:  [phone]  Name: BETHEL GAGLIO MRN: [id number] Date of Birth: 1923-11-20

## 2018-04-27 ENCOUNTER — Encounter: Payer: Self-pay | Admitting: Physical Therapy

## 2018-04-27 ENCOUNTER — Ambulatory Visit: Payer: Medicare HMO | Admitting: Physical Therapy

## 2018-04-27 DIAGNOSIS — Z9181 History of falling: Secondary | ICD-10-CM | POA: Diagnosis not present

## 2018-04-27 DIAGNOSIS — R2681 Unsteadiness on feet: Secondary | ICD-10-CM | POA: Diagnosis not present

## 2018-04-27 DIAGNOSIS — M6281 Muscle weakness (generalized): Secondary | ICD-10-CM | POA: Diagnosis not present

## 2018-04-27 NOTE — Therapy (Signed)
[facility] [address], [place], [place], [address] Phone: [phone]   Fax:  [phone]  Physical Therapy Treatment/Discharge  Patient Details  Name: Larry Huffman MRN: [id number] Date of Birth: 1923-09-22 Referring Provider (PT): Dineen Kid, MD   Encounter Date: 04/27/2018  PT End of Session - 04/27/18 1554    Visit Number  9    Date for PT Re-Evaluation  04/29/18    Authorization Type  [employer]     Authorization Time Period  02/27/18 to 04/29/18    Authorization - Visit Number  9    Authorization - Number of Visits  10    PT Start Time  0998    PT Stop Time  3382    PT Time Calculation (min)  43 min    Activity Tolerance  No increased pain;Patient tolerated treatment well    Behavior During Therapy  [facility] for tasks assessed/performed       Past Medical History:  Diagnosis Date  . Acid reflux   . Arthritis   . CAD (coronary artery disease)   . Cancer (HCC) hx of skin cancer  . Constipation   . Coronary artery disease   . High cholesterol   . HOH (hard of hearing)    wears hearing aids, but still can not hear  . Hypertension   . Incontinence of urine   . Peripheral vascular disease ([place])    Remote left carotid endarterectomy  . Sleep apnea    CPAP    Past Surgical History:  Procedure Laterality Date  . 2-D echocardiogram  03/07/2008   Ejection fraction greater than 55%. Mild concentric left ventricular hypertrophy.LV relaxation. mildly dilated left atrium. Mild mitral regurgitation.   . ANAL FISSURE REPAIR    . CARDIAC CATHETERIZATION     X 2 stents  . COLON SURGERY     removed part of the small intestine  . COLONOSCOPY W/ POLYPECTOMY    . CORONARY STENT PLACEMENT    . ESOPHAGOGASTRODUODENOSCOPY N/A 01/16/2015   Procedure: ESOPHAGOGASTRODUODENOSCOPY (EGD);  Surgeon: Irene Shipper, MD;  Location: [facility];  Service: Endoscopy;  Laterality: N/A;  . EYE SURGERY     cataracts  . I&D  EXTREMITY  06/28/2012   Procedure: IRRIGATION AND DEBRIDEMENT EXTREMITY;  Surgeon: Newt Minion, MD;  Location: [facility];  Service: Orthopedics;  Laterality: Right;  Debridement Wound Right Leg, VAC, Antibiotic Beads, Apply A-Cell  . IRRIGATION AND DEBRIDEMENT ABSCESS    . LAPAROTOMY    . LUMBAR LAMINECTOMY N/A 02/08/2014   Procedure: L3-4, L4-5 Decompression;  Surgeon: Marybelle Killings, MD;  Location: [place];  Service: Orthopedics;  Laterality: N/A;  L3-4, L4-5 Decompression  . NASAL SINUS SURGERY    . PROSTATE SURGERY    . ROTATOR CUFF REPAIR      There were no vitals filed for this visit.  Subjective Assessment - 04/27/18 1537    Subjective  Pt reports that her is doing pretty good. He has been having some pain in his Rt hip but today it isn't bothering him much.     Limitations  Walking    Patient Stated Goals  decrease risk of falling     Currently in Pain?  No/denies         [facility] PT Assessment - 04/27/18 0001      Assessment   Medical Diagnosis  repeated falls     Referring Provider (PT)  Dineen Kid, MD    Prior  Therapy  none for balance       Precautions   Precautions  Fall      Restrictions   Weight Bearing Restrictions  No      Balance Screen   Has the patient fallen in the past 6 months  Yes    How many times?  multiple    Has the patient had a decrease in activity level because of a fear of falling?   Yes    Is the patient reluctant to leave their home because of a fear of falling?   No      Home Film/video editor residence    Additional Comments  pt lives with his daughter and has a caregiver during the week days       Prior Function   Level of Independence  Independent      Cognition   Overall Cognitive Status  Within Functional Limits for tasks assessed      Strength   Right Hip Flexion  4/5    Right Hip Extension  3/5    Right Hip ABduction  3/5    Left Hip Flexion  4/5    Left Hip Extension  3/5    Left Hip ABduction  3/5     Right Knee Extension  5/5    Left Knee Extension  5/5    Right Ankle Dorsiflexion  5/5    Left Ankle Dorsiflexion  5/5      Transfers   Five time sit to stand comments   15 sec, no UE      Ambulation/Gait   Gait Comments  using RW, small steps, flexed posture       Standardized Balance Assessment   Standardized Balance Assessment  Timed Up and Go Test;Berg Balance Test      Berg Balance Test   Sit to Stand  Able to stand without using hands and stabilize independently    Standing Unsupported  Able to stand safely 2 minutes    Sitting with Back Unsupported but Feet Supported on Floor or Stool  Able to sit safely and securely 2 minutes    Stand to Sit  Controls descent by using hands    Transfers  Able to transfer with verbal cueing and /or supervision    Standing Unsupported with Eyes Closed  Able to stand 10 seconds with supervision    Standing Ubsupported with Feet Together  Able to place feet together independently and stand for 1 minute with supervision    From Standing, Reach Forward with Outstretched Arm  Reaches forward but needs supervision    From Standing Position, Pick up Object from Floor  Able to pick up shoe, needs supervision    From Standing Position, Turn to Look Behind Over each Shoulder  Needs supervision when turning    Turn 360 Degrees  Able to turn 360 degrees safely but slowly    Standing Unsupported, Alternately Place Feet on Step/Stool  Able to complete 4 steps without aid or supervision    Standing Unsupported, One Foot in Front  Able to take small step independently and hold 30 seconds    Standing on One Leg  Tries to lift leg/unable to hold 3 seconds but remains standing independently    Total Score  35      Timed Up and Go Test   TUG Comments  25 sec, RW  [facility] PT Treatment/Exercise - 04/27/18 0001      Knee/Hip Exercises: Standing   Heel Raises  Both;1 set;20 reps    Hip Abduction  Stengthening;Both;1 set;20  reps;Knee straight    Other Standing Knee Exercises  hip extension x20 reps each LE             PT Education - 04/27/18 1600    Education Details  technique with therex; improvements and importance of HEP adherence in order to make progress    Person(s) Educated  Patient;Caregiver(s)    Methods  Explanation;Handout    Comprehension  Returned demonstration;Verbalized understanding       PT Short Term Goals - 04/27/18 1708      PT SHORT TERM GOAL #1   Title  Pt will demo consistency and independence with his initial HEP to improve LE strength.     Baseline  4 days a week with caregiver    Time  4    Period  Weeks    Status  Achieved        PT Long Term Goals - 04/27/18 1554      PT LONG TERM GOAL #1   Title  Pt will have increased BLE strenth to atleast 4/5 MMT which will improve his safety and efficiency with tasks at home.     Baseline  ankle DF increased to 5/5 MMT, Rt knee extension 5/5 MMT (largely dependent on pt ability to following instructions)    Time  8    Period  Weeks    Status  Partially Met      PT LONG TERM GOAL #2   Title  Pt and his caregiver will be able to verbalize atleast 4 ways to decrease his risk of falling at home.    Baseline  discussed this session with pt and caregiver    Time  8    Period  Weeks    Status  Achieved      PT LONG TERM GOAL #3   Title  Pt will complete the TUG with LRAD in less than 14 sec to reflect improvements in his balance and stability during functional activity.     Baseline  25 sec with RW    Time  8    Period  Weeks    Status  Partially Met      PT LONG TERM GOAL #4   Title  Pt will complete 5x sit to stand in less than 14 sec without UE support, to reflect improvements in functional strength and power.     Baseline  15 sec, no UE support    Time  8    Period  Weeks    Status  Partially Met      PT LONG TERM GOAL #5   Title  Pt will have a clinically significant improvement in his Berg balance score by  atleast 8 points, to decrease his risk of recurrent falls and injury.     Baseline  4 point improvement     Time  8    Period  Weeks    Status  Partially Met            Plan - 04/27/18 1555    Clinical Impression Statement  Pt was discharged this visit having made some progress towards his short and long term goals. Pt has had difficulty finding motivation to complete his HEP and requires a lot of direction from his caregiver. Pt's strength and 5x sit to stand  have improved, however it is difficult to determine if this is due to improved ability to follow instruction compared to his evaluation. Pt had a 4 point improvement on his BERG balance test and his TUG improved by 3 sec when using his RW compared to his SPC. Pt and his caregiver have been educated on ways to decrease falls risk and he has been encouraged to continue using his RW throughout the day. At this time, pt would benefit from continue HEP adherence and working with his caregiver to ensure that his strength and mobility is maintained. They were provided with an updated HEP and this was reviewed to ensure understanding.     Rehab Potential  Good    PT Frequency  2x / week    PT Duration  8 weeks    PT Treatment/Interventions  ADLs/Self Care Home Management;Gait training;Stair training;Functional mobility training;Patient/family education;Therapeutic exercise;Manual techniques;Passive range of motion;Neuromuscular re-education;Balance training    PT Next Visit Plan  d/c home     PT Home Exercise Plan  [email or web address]    Consulted and Agree with Plan of Care  Patient;Family member/caregiver    Family Member Consulted  caregiver        Patient will benefit from skilled therapeutic intervention in order to improve the following deficits and impairments:  Abnormal gait, Decreased activity tolerance, Decreased strength, Impaired flexibility, Improper body mechanics, Decreased range of motion, Decreased endurance, Decreased balance,  Difficulty walking  Visit Diagnosis: History of falling  Unsteadiness on feet  Muscle weakness (generalized)     Problem List Patient Active Problem List   Diagnosis Date Noted  . Long-term (current) use of anticoagulants 02/21/2015  . Allergic rhinitis 01/24/2015  . Malnutrition of moderate degree ([place]) 01/16/2015  . Warfarin-induced coagulopathy ([place])   . Melena   . Severe anemia 01/15/2015  . GI bleed 01/15/2015  . Acute blood loss anemia 01/15/2015  . Chronic anticoagulation 06/04/2014  . Dementia ([place]) 04/19/2014  . Chronic diastolic heart failure ([place]) 04/15/2014  . Chronic kidney disease (CKD), stage III (moderate) ([place]) 04/15/2014  . Pulmonary embolism ([place]) 04/13/2014  . Spinal stenosis, lumbar region, with neurogenic claudication 02/08/2014    Class: Diagnosis of  . Peripheral arterial disease: History of left carotid endarterectomy 03/15/2013  . Obstructive sleep apnea: Uses CPAP 03/15/2013  . Venous insufficiency 03/15/2013  . Essential hypertension 03/15/2013  . HLD (hyperlipidemia) 03/15/2013  . Chest pain 07/28/2012  . CAD - CFX DES 7/07, LAD DES 10/08, cath 11/11- medical Rx 07/28/2012   PHYSICAL THERAPY DISCHARGE SUMMARY  Visits from Start of Care: 9  Current functional level related to goals / functional outcomes: See above for more details    Remaining deficits: See above for more details    Education / Equipment: See above for more details   Plan: Patient agrees to discharge.  Patient goals were partially met. Patient is being discharged due to lack of progress.  ?????         5:10 PM,04/27/18 Sherol Dade PT, [place] [facility] [place]  [facility] [address], [place], [place], [address] Phone: [phone]   Fax:  [phone]  Name: Larry Huffman MRN: [id number] Date of Birth: Dec 06, 1923

## 2018-04-27 NOTE — Patient Instructions (Signed)
Access Code: [email or web address]  URL: [email or web address]  Date: 04/27/2018  Prepared by: Sherol Dade   Exercises  Heel Raises with Gilford Rile and Chair - 15 reps - 2 sets - 2-3x daily - 7x weekly  Sit to Stand - 10 reps - 2 sets - 2-3x daily - 7x weekly  Seated Hip Abduction - 10 reps - 3 sets - 2-3x daily - 7x weekly  Seated Hip Adduction Isometrics with Ball - 10 reps - 2-3x daily - 7x weekly  Tandem Stance with Support - 10 reps - 1x daily - 7x weekly  Standing Narrow Base of Support - 10 reps - 1x daily - 7x weekly  Side Stepping with Counter Support - 10-15 reps - 1x daily - 7x weekly    [facility] [address] [place], [place] [address] Phone # [phone] Fax [phone]

## 2018-06-19 DIAGNOSIS — L308 Other specified dermatitis: Secondary | ICD-10-CM | POA: Diagnosis not present

## 2018-06-20 ENCOUNTER — Emergency Department (HOSPITAL_COMMUNITY): Payer: Medicare HMO

## 2018-06-20 ENCOUNTER — Encounter (HOSPITAL_COMMUNITY): Payer: Self-pay | Admitting: Pharmacy Technician

## 2018-06-20 ENCOUNTER — Emergency Department (HOSPITAL_COMMUNITY)
Admission: EM | Admit: 2018-06-20 | Discharge: 2018-06-20 | Disposition: A | Discharge status: Home / Self Care | Payer: Medicare HMO | Attending: Emergency Medicine | Admitting: Emergency Medicine

## 2018-06-20 ENCOUNTER — Other Ambulatory Visit: Payer: Self-pay

## 2018-06-20 DIAGNOSIS — S0990XA Unspecified injury of head, initial encounter: Secondary | ICD-10-CM | POA: Diagnosis not present

## 2018-06-20 DIAGNOSIS — Y92008 Other place in unspecified non-institutional (private) residence as the place of occurrence of the external cause: Secondary | ICD-10-CM | POA: Insufficient documentation

## 2018-06-20 DIAGNOSIS — E7800 Pure hypercholesterolemia, unspecified: Secondary | ICD-10-CM | POA: Insufficient documentation

## 2018-06-20 DIAGNOSIS — Z87891 Personal history of nicotine dependence: Secondary | ICD-10-CM | POA: Diagnosis not present

## 2018-06-20 DIAGNOSIS — N183 Chronic kidney disease, stage 3 (moderate): Secondary | ICD-10-CM | POA: Insufficient documentation

## 2018-06-20 DIAGNOSIS — Y9301 Activity, walking, marching and hiking: Secondary | ICD-10-CM | POA: Diagnosis not present

## 2018-06-20 DIAGNOSIS — S199XXA Unspecified injury of neck, initial encounter: Secondary | ICD-10-CM | POA: Diagnosis not present

## 2018-06-20 DIAGNOSIS — R58 Hemorrhage, not elsewhere classified: Secondary | ICD-10-CM | POA: Diagnosis not present

## 2018-06-20 DIAGNOSIS — Z23 Encounter for immunization: Secondary | ICD-10-CM | POA: Diagnosis not present

## 2018-06-20 DIAGNOSIS — S0181XA Laceration without foreign body of other part of head, initial encounter: Secondary | ICD-10-CM | POA: Insufficient documentation

## 2018-06-20 DIAGNOSIS — I2510 Atherosclerotic heart disease of native coronary artery without angina pectoris: Secondary | ICD-10-CM | POA: Insufficient documentation

## 2018-06-20 DIAGNOSIS — M79601 Pain in right arm: Secondary | ICD-10-CM | POA: Diagnosis not present

## 2018-06-20 DIAGNOSIS — W010XXA Fall on same level from slipping, tripping and stumbling without subsequent striking against object, initial encounter: Secondary | ICD-10-CM | POA: Insufficient documentation

## 2018-06-20 DIAGNOSIS — S3992XA Unspecified injury of lower back, initial encounter: Secondary | ICD-10-CM | POA: Diagnosis not present

## 2018-06-20 DIAGNOSIS — R220 Localized swelling, mass and lump, head: Secondary | ICD-10-CM | POA: Diagnosis not present

## 2018-06-20 DIAGNOSIS — E785 Hyperlipidemia, unspecified: Secondary | ICD-10-CM | POA: Diagnosis not present

## 2018-06-20 DIAGNOSIS — Z86718 Personal history of other venous thrombosis and embolism: Secondary | ICD-10-CM | POA: Diagnosis not present

## 2018-06-20 DIAGNOSIS — Y998 Other external cause status: Secondary | ICD-10-CM | POA: Insufficient documentation

## 2018-06-20 DIAGNOSIS — I130 Hypertensive heart and chronic kidney disease with heart failure and stage 1 through stage 4 chronic kidney disease, or unspecified chronic kidney disease: Secondary | ICD-10-CM | POA: Insufficient documentation

## 2018-06-20 DIAGNOSIS — F0390 Unspecified dementia without behavioral disturbance: Secondary | ICD-10-CM | POA: Insufficient documentation

## 2018-06-20 DIAGNOSIS — M542 Cervicalgia: Secondary | ICD-10-CM | POA: Diagnosis not present

## 2018-06-20 DIAGNOSIS — W19XXXA Unspecified fall, initial encounter: Secondary | ICD-10-CM | POA: Diagnosis not present

## 2018-06-20 DIAGNOSIS — Z79899 Other long term (current) drug therapy: Secondary | ICD-10-CM | POA: Diagnosis not present

## 2018-06-20 DIAGNOSIS — S51811A Laceration without foreign body of right forearm, initial encounter: Secondary | ICD-10-CM | POA: Diagnosis not present

## 2018-06-20 DIAGNOSIS — Z7982 Long term (current) use of aspirin: Secondary | ICD-10-CM | POA: Insufficient documentation

## 2018-06-20 DIAGNOSIS — I10 Essential (primary) hypertension: Secondary | ICD-10-CM | POA: Diagnosis not present

## 2018-06-20 DIAGNOSIS — R51 Headache: Secondary | ICD-10-CM | POA: Diagnosis not present

## 2018-06-20 DIAGNOSIS — R404 Transient alteration of awareness: Secondary | ICD-10-CM | POA: Diagnosis not present

## 2018-06-20 DIAGNOSIS — I5032 Chronic diastolic (congestive) heart failure: Secondary | ICD-10-CM | POA: Insufficient documentation

## 2018-06-20 DIAGNOSIS — S01411A Laceration without foreign body of right cheek and temporomandibular area, initial encounter: Secondary | ICD-10-CM | POA: Diagnosis not present

## 2018-06-20 MED ORDER — TETANUS-DIPHTH-ACELL PERTUSSIS 5-2.5-18.5 LF-MCG/0.5 IM SUSP
0.5000 mL | Freq: Once | INTRAMUSCULAR | Status: AC
  Administered 2018-06-20: 0.5 mL via INTRAMUSCULAR
  Filled 2018-06-20: qty 0.5

## 2018-06-20 MED ORDER — LIDOCAINE-EPINEPHRINE 2 %-1:100000 IJ SOLN
20.0000 mL | Freq: Once | INTRAMUSCULAR | Status: AC
  Administered 2018-06-20: 20 mL
  Filled 2018-06-20: qty 20

## 2018-06-20 NOTE — ED Notes (Signed)
Staples and steristrips removed. New Steristrips place. Non adhesive dressing and bacitracin applied.

## 2018-06-20 NOTE — ED Notes (Signed)
Patient verbalizes understanding of discharge instructions. Opportunity for questioning and answers were provided. 

## 2018-06-20 NOTE — ED Triage Notes (Signed)
Pt bib ems with fall on plavix. Small lac to R cheek. Pt on thinners. EMS reports pt cleared cspine. Pt with hx of dementia. When removing pt clothing, R arm with large tear, bleeding. Pressure applied, MD to bedside.

## 2018-06-20 NOTE — ED Provider Notes (Signed)
[facility] EMERGENCY DEPARTMENT Provider Note   CSN: [id number] Arrival date & time: 06/20/18  1632     History   Chief Complaint No chief complaint on file.   HPI Larry Huffman is a 84 y.o. male.  Patient is a 84 year old male who presents after a fall.  He was brought in by EMS and history is limited due to his dementia.  Reportedly he was walking out to the mailbox and tripped on a rock.  He sustained a laceration to his head.  There is also a large laceration to his right forearm that is actively bleeding.  He denies any neck or back pain.  He denies any other injuries from the fall.  History is little bit limited due to his dementia.  He will answer questions but is confused at times.  He is on Plavix.     Past Medical History:  Diagnosis Date  . Acid reflux   . Arthritis   . CAD (coronary artery disease)   . Cancer (HCC) hx of skin cancer  . Constipation   . Coronary artery disease   . High cholesterol   . HOH (hard of hearing)    wears hearing aids, but still can not hear  . Hypertension   . Incontinence of urine   . Peripheral vascular disease ([place])    Remote left carotid endarterectomy  . Sleep apnea    CPAP    Patient Active Problem List   Diagnosis Date Noted  . Long-term (current) use of anticoagulants 02/21/2015  . Allergic rhinitis 01/24/2015  . Malnutrition of moderate degree ([place]) 01/16/2015  . Warfarin-induced coagulopathy ([place])   . Melena   . Severe anemia 01/15/2015  . GI bleed 01/15/2015  . Acute blood loss anemia 01/15/2015  . Chronic anticoagulation 06/04/2014  . Dementia ([place]) 04/19/2014  . Chronic diastolic heart failure ([place]) 04/15/2014  . Chronic kidney disease (CKD), stage III (moderate) ([place]) 04/15/2014  . Pulmonary embolism ([place]) 04/13/2014  . Spinal stenosis, lumbar region, with neurogenic claudication 02/08/2014    Class: Diagnosis of  . Peripheral arterial disease: History of left carotid endarterectomy  03/15/2013  . Obstructive sleep apnea: Uses CPAP 03/15/2013  . Venous insufficiency 03/15/2013  . Essential hypertension 03/15/2013  . HLD (hyperlipidemia) 03/15/2013  . Chest pain 07/28/2012  . CAD - CFX DES 7/07, LAD DES 10/08, cath 11/11- medical Rx 07/28/2012    Past Surgical History:  Procedure Laterality Date  . 2-D echocardiogram  03/07/2008   Ejection fraction greater than 55%. Mild concentric left ventricular hypertrophy.LV relaxation. mildly dilated left atrium. Mild mitral regurgitation.   . ANAL FISSURE REPAIR    . CARDIAC CATHETERIZATION     X 2 stents  . COLON SURGERY     removed part of the small intestine  . COLONOSCOPY W/ POLYPECTOMY    . CORONARY STENT PLACEMENT    . ESOPHAGOGASTRODUODENOSCOPY N/A 01/16/2015   Procedure: ESOPHAGOGASTRODUODENOSCOPY (EGD);  Surgeon: Irene Shipper, MD;  Location: [facility] ENDOSCOPY;  Service: Endoscopy;  Laterality: N/A;  . EYE SURGERY     cataracts  . I&D EXTREMITY  06/28/2012   Procedure: IRRIGATION AND DEBRIDEMENT EXTREMITY;  Surgeon: Newt Minion, MD;  Location: [facility];  Service: Orthopedics;  Laterality: Right;  Debridement Wound Right Leg, VAC, Antibiotic Beads, Apply A-Cell  . IRRIGATION AND DEBRIDEMENT ABSCESS    . LAPAROTOMY    . LUMBAR LAMINECTOMY N/A 02/08/2014   Procedure: L3-4, L4-5 Decompression;  Surgeon: Marybelle Killings, MD;  Location: [facility];  Service: Orthopedics;  Laterality: N/A;  L3-4, L4-5 Decompression  . NASAL SINUS SURGERY    . PROSTATE SURGERY    . ROTATOR CUFF REPAIR          Home Medications    Prior to Admission medications   Medication Sig Start Date End Date Taking? Authorizing Provider  amLODipine (NORVASC) 10 MG tablet Take 10 mg by mouth daily.   Yes [provider]  Ascorbic Acid (VITAMIN C) 1000 MG tablet Take 1,000 mg by mouth daily. 07/01/10  Yes [provider]  aspirin EC 325 MG tablet Take 325 mg by mouth at bedtime.    Yes [provider]  clobetasol cream (TEMOVATE)  7.90 % Apply 1 application topically 2 (two) times daily.   Yes [provider]  clopidogrel (PLAVIX) 75 MG tablet Take 1 tablet (75 mg total) by mouth daily. 03/26/16  Yes Lorretta Harp, MD  ferrous sulfate 325 (65 FE) MG tablet Take 325 mg by mouth daily with breakfast.   Yes [provider]  loratadine (CLARITIN) 10 MG tablet Take 5 mg by mouth daily.   Yes [provider]  montelukast (SINGULAIR) 10 MG tablet Take 10 mg by mouth at bedtime.   Yes [provider]  Multiple Vitamin (MULTIVITAMIN WITH MINERALS) TABS Take 1 tablet by mouth daily. [employer] brand multivitamin   Yes [provider]  Multiple Vitamins-Minerals (OCUVITE ADULT 50+ PO) Take 1 tablet by mouth daily.    Yes [provider]  nitroGLYCERIN (NITROSTAT) 0.4 MG SL tablet Place 1 tablet (0.4 mg total) under the tongue every 5 (five) minutes as needed for chest pain. 09/25/15  Yes Lorretta Harp, MD  Omega-3 Fatty Acids (FISH OIL) 1200 MG CAPS Take 1,200 mg by mouth daily.    Yes [provider]  pantoprazole (PROTONIX) 40 MG tablet Take 1 tablet (40 mg total) by mouth daily. 04/02/16  Yes Lorretta Harp, MD  polyethylene glycol [facility] / Floria Raveling) packet Take 17 g by mouth every morning.    Yes [provider]  pyridOXINE (VITAMIN B-6) 50 MG tablet Take 50 mg by mouth daily.   Yes [provider]  simvastatin (ZOCOR) 10 MG tablet Take 1 tablet (10 mg total) by mouth at bedtime. 04/02/16  Yes Lorretta Harp, MD  acetaminophen (TYLENOL) 500 MG tablet Take 1,000 mg by mouth 2 (two) times daily as needed for mild pain.     [provider]  clobetasol (TEMOVATE) 0.05 % external solution Apply 1 application topically See admin instructions. 1 application every morning, and 1 application twice daily during break outs 11/03/15   [provider]    Family History Family History  Problem Relation Age of Onset  . Hypertension Mother      Social History Social History   Tobacco Use  . Smoking status: Former Smoker    Types: Cigarettes    Last attempt to quit: 07/12/1978    Years since quitting: 39.9  . Smokeless tobacco: Never Used  Substance Use Topics  . Alcohol use: No  . Drug use: No     Allergies   Ciprofloxacin; Tape; and Dapsone   Review of Systems Review of Systems  Unable to perform ROS: Dementia     Physical Exam Updated Vital Signs BP (!) 168/88 (BP Location: Left Arm)   Pulse 70   Temp 97.8 F (36.6 C) (Oral)   Resp 18   SpO2 99%   Physical  Exam  Constitutional: He appears well-developed and well-nourished.  HENT:  Head: Normocephalic.  2cm irregular laceration over right maxilla, smaller 1cm laceration just inferior to this, mild swelling over maxilla  Eyes: Pupils are equal, round, and reactive to light.  Neck:  Mild tenderness to the left paraspinal area in the cervical region.  There is no pain to the thoracic or lumbosacral spine.  Cardiovascular: Normal rate, regular rhythm and normal heart sounds.  Pulmonary/Chest: Effort normal and breath sounds normal. No respiratory distress. He has no wheezes. He has no rales. He exhibits no tenderness.  Abdominal: Soft. Bowel sounds are normal. There is no tenderness. There is no rebound and no guarding.  Musculoskeletal: Normal range of motion. He exhibits no edema.  No pain on palpation or range of motion of the extremities.  There is a large 6 cm laceration to the volar surface of the right midforearm.  There is some active rapid oozing of blood, radial pulses are intact.  He has normal sensation and motor function distally.  Lymphadenopathy:    He has no cervical adenopathy.  Neurological: He is alert.  Patient is oriented to person and place but confused to year and day.  Skin: Skin is warm and dry. No rash noted.  Psychiatric: He has a normal mood and affect.     ED Treatments / Results  Labs (all labs ordered are listed, but  only abnormal results are displayed) Labs Reviewed - No data to display  EKG None  Radiology Dg Forearm Right  Result Date: 06/20/2018 CLINICAL DATA:  Arm pain EXAM: RIGHT FOREARM - 2 VIEW COMPARISON:  None. FINDINGS: Negative for fracture. Skin staples are present over the mid forearm presumably from laceration repair. No foreign body. Degenerative change base of thumb. IMPRESSION: No acute abnormality Electronically Signed   By: Franchot Gallo M.D.   On: 06/20/2018 19:18   Ct Head Wo Contrast  Result Date: 06/20/2018 CLINICAL DATA:  Recent fall with headaches and neck pain, initial encounter EXAM: CT HEAD WITHOUT CONTRAST CT MAXILLOFACIAL WITHOUT CONTRAST CT CERVICAL SPINE WITHOUT CONTRAST TECHNIQUE: Multidetector CT imaging of the head, cervical spine, and maxillofacial structures were performed using the standard protocol without intravenous contrast. Multiplanar CT image reconstructions of the cervical spine and maxillofacial structures were also generated. COMPARISON:  10/14/2017 FINDINGS: CT HEAD FINDINGS Brain: Chronic atrophic and ischemic changes are noted. 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 No findings to suggest acute hemorrhage, acute infarction or space-occupying mass lesion are noted. Vascular: No hyperdense vessel or unexpected calcification. Skull: Normal. Negative for fracture or focal lesion. Other: None CT MAXILLOFACIAL FINDINGS Osseous: No acute fracture is identified. Mild degenerative changes of the visualized cervical spine is seen. Orbits: Orbits demonstrate bilateral surgical change in the globes Sinuses: Paranasal sinuses are within normal limits. Soft tissues: Mild soft tissue swelling is noted in the right infraorbital region consistent with the recent injury. No other soft tissue abnormality is noted. CT CERVICAL SPINE FINDINGS Alignment: Mild anterolisthesis of C3 on C4 and C4 on C5 is noted stable from the previous exam. Skull base  and vertebrae: 7 cervical segments are well visualized. Vertebral body height is well maintained. Multilevel facet hypertrophic changes are noted. No acute fracture or acute facet abnormality is noted. Multilevel osteophytic changes are noted most prominent at C5-6 and C6-7 stable from the prior exam. Soft tissues and spinal canal: No acute abnormality noted. Diffuse vascular calcifications are noted. Upper chest: Negative. 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 No acute abnormality is noted. CT of the maxillofacial bones: No acute fracture is noted. Mild soft tissue swelling in the right infraorbital region related to the recent injury is noted. CT of the cervical spine: Multilevel degenerative changes without acute abnormality. Electronically Signed   By: Inez Catalina M.D.   On: 06/20/2018 19:48   Ct Cervical Spine Wo Contrast  Result Date: 06/20/2018 CLINICAL DATA:  Recent fall with headaches and neck pain, initial encounter EXAM: CT HEAD WITHOUT CONTRAST CT MAXILLOFACIAL WITHOUT CONTRAST CT CERVICAL SPINE WITHOUT CONTRAST TECHNIQUE: Multidetector CT imaging of the head, cervical spine, and maxillofacial structures were performed using the standard protocol without intravenous contrast. Multiplanar CT image reconstructions of the cervical spine and maxillofacial structures were also generated. COMPARISON:  10/14/2017 FINDINGS: CT HEAD FINDINGS Brain: Chronic atrophic and ischemic changes are noted. 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 No findings to suggest acute hemorrhage, acute infarction or space-occupying mass lesion are noted. Vascular: No hyperdense vessel or unexpected calcification. Skull: Normal. Negative for fracture or focal lesion. Other: None CT MAXILLOFACIAL FINDINGS Osseous: No acute fracture is identified. Mild degenerative changes of the visualized cervical spine is seen. Orbits: Orbits demonstrate  bilateral surgical change in the globes Sinuses: Paranasal sinuses are within normal limits. Soft tissues: Mild soft tissue swelling is noted in the right infraorbital region consistent with the recent injury. No other soft tissue abnormality is noted. CT CERVICAL SPINE FINDINGS Alignment: Mild anterolisthesis of C3 on C4 and C4 on C5 is noted stable from the previous exam. Skull base and vertebrae: 7 cervical segments are well visualized. Vertebral body height is well maintained. Multilevel facet hypertrophic changes are noted. No acute fracture or acute facet abnormality is noted. Multilevel osteophytic changes are noted most prominent at C5-6 and C6-7 stable from the prior exam. Soft tissues and spinal canal: No acute abnormality noted. Diffuse vascular calcifications are noted. Upper chest: Negative. 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 No acute abnormality is noted. CT of the maxillofacial bones: No acute fracture is noted. Mild soft tissue swelling in the right infraorbital region related to the recent injury is noted. CT of the cervical spine: Multilevel degenerative changes without acute abnormality. Electronically Signed   By: Inez Catalina M.D.   On: 06/20/2018 19:48   Ct Maxillofacial Wo Cm  Result Date: 06/20/2018 CLINICAL DATA:  Recent fall with headaches and neck pain, initial encounter EXAM: CT HEAD WITHOUT CONTRAST CT MAXILLOFACIAL WITHOUT CONTRAST CT CERVICAL SPINE WITHOUT CONTRAST TECHNIQUE: Multidetector CT imaging of the head, cervical spine, and maxillofacial structures were performed using the standard protocol without intravenous contrast. Multiplanar CT image reconstructions of the cervical spine and maxillofacial structures were also generated. COMPARISON:  10/14/2017 FINDINGS: CT HEAD FINDINGS Brain: Chronic atrophic and ischemic changes are noted. 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 No findings  to suggest acute hemorrhage, acute infarction or space-occupying mass lesion are noted. Vascular: No hyperdense vessel or unexpected calcification. Skull: Normal. Negative for fracture or focal lesion. Other: None CT MAXILLOFACIAL FINDINGS Osseous: No acute fracture is identified. Mild degenerative changes of the visualized cervical spine is seen. Orbits: Orbits demonstrate bilateral surgical change in the globes Sinuses: Paranasal sinuses are within normal limits. Soft tissues: Mild soft tissue swelling is noted in the right infraorbital region consistent with the recent injury. No other soft tissue abnormality is noted. CT  CERVICAL SPINE FINDINGS Alignment: Mild anterolisthesis of C3 on C4 and C4 on C5 is noted stable from the previous exam. Skull base and vertebrae: 7 cervical segments are well visualized. Vertebral body height is well maintained. Multilevel facet hypertrophic changes are noted. No acute fracture or acute facet abnormality is noted. Multilevel osteophytic changes are noted most prominent at C5-6 and C6-7 stable from the prior exam. Soft tissues and spinal canal: No acute abnormality noted. Diffuse vascular calcifications are noted. Upper chest: Negative. 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 No acute abnormality is noted. CT of the maxillofacial bones: No acute fracture is noted. Mild soft tissue swelling in the right infraorbital region related to the recent injury is noted. CT of the cervical spine: Multilevel degenerative changes without acute abnormality. Electronically Signed   By: Inez Catalina M.D.   On: 06/20/2018 19:48    Procedures .Marland KitchenLaceration Repair Date/Time: 06/20/2018 11:33 PM Performed by: Malvin Johns, MD Authorized by: Malvin Johns, MD   Consent:    Consent obtained:  Verbal   Consent given by:  Patient   Risks discussed:  Infection, poor cosmetic result and pain Anesthesia (see MAR for exact dosages):     Anesthesia method:  Local infiltration   Local anesthetic:  Lidocaine 2% WITH epi Laceration details:    Location:  Face   Face location:  R cheek   Length (cm):  2 Repair type:    Repair type:  Simple Pre-procedure details:    Preparation:  Patient was prepped and draped in usual sterile fashion and imaging obtained to evaluate for foreign bodies Exploration:    Hemostasis achieved with:  Direct pressure   Wound exploration: entire depth of wound probed and visualized     Wound extent: no fascia violation noted, no foreign bodies/material noted, no muscle damage noted, no tendon damage noted and no vascular damage noted     Contaminated: no   Treatment:    Area cleansed with:  Saline   Amount of cleaning:  Standard   Irrigation solution:  Sterile water   Irrigation method:  Syringe   Visualized foreign bodies/material removed: no   Skin repair:    Repair method:  Sutures   Suture size:  6-0   Suture material:  Prolene   Number of sutures:  6 Approximation:    Approximation:  Close Post-procedure details:    Dressing:  Antibiotic ointment .Marland KitchenLaceration Repair Date/Time: 06/20/2018 11:34 PM Performed by: Malvin Johns, MD Authorized by: Malvin Johns, MD   Consent:    Consent given by:  Patient   Risks discussed:  Infection, poor cosmetic result, poor wound healing and retained foreign body Anesthesia (see MAR for exact dosages):    Anesthesia method:  Local infiltration   Local anesthetic:  Lidocaine 2% WITH epi Laceration details:    Location:  Face   Length (cm):  1 Repair type:    Repair type:  Simple Pre-procedure details:    Preparation:  Patient was prepped and draped in usual sterile fashion Exploration:    Wound exploration: entire depth of wound probed and visualized     Wound extent: no foreign bodies/material noted, no underlying fracture noted and no vascular damage noted   Treatment:    Area cleansed with:  Saline   Amount of cleaning:  Standard    Irrigation solution:  Sterile water   Irrigation method:  Syringe   Visualized foreign bodies/material removed: no   Skin repair:  Repair method:  Sutures   Suture size:  6-0   Suture material:  Prolene   Number of sutures:  3 Approximation:    Approximation:  Close Post-procedure details:    Dressing:  Antibiotic ointment   (including critical care time)  Medications Ordered in ED Medications  lidocaine-EPINEPHrine (XYLOCAINE W/EPI) 2 %-1:100000 (with pres) injection 20 mL (20 mLs Other Given by Other 06/20/18 1900)  Tdap (BOOSTRIX) injection 0.5 mL (0.5 mLs Intramuscular Given 06/20/18 2006)     Initial Impression / Assessment and Plan / ED Course  I have reviewed the triage vital signs and the nursing notes.  Pertinent labs & imaging results that were available during my care of the patient were reviewed by me and considered in my medical decision making (see chart for details).    Patient presents after mechanical fall.  He has no evidence of intracranial hemorrhage, cervical or facial fractures.  his facial lacerations were repaired.  I attempted to repair the skin tear on his forearm with staples but they would not hold.  It was irrigated with copious amounts of saline and Steri-Strips were applied.  Family was given wound care instructions and advised that he will need to have this sutures removed in about a week.  Return precautions were given. His tetanus shot was updated.  Final Clinical Impressions(s) / ED Diagnoses   Final diagnoses:  Facial laceration, initial encounter  Skin tear of right forearm without complication, initial encounter    ED Discharge Orders    None       Malvin Johns, MD 06/20/18 2341

## 2018-06-27 DIAGNOSIS — R110 Nausea: Secondary | ICD-10-CM | POA: Diagnosis not present

## 2018-06-27 DIAGNOSIS — Z4802 Encounter for removal of sutures: Secondary | ICD-10-CM | POA: Diagnosis not present

## 2018-11-27 DIAGNOSIS — H35033 Hypertensive retinopathy, bilateral: Secondary | ICD-10-CM | POA: Diagnosis not present

## 2018-11-27 DIAGNOSIS — Z961 Presence of intraocular lens: Secondary | ICD-10-CM | POA: Diagnosis not present

## 2018-11-27 DIAGNOSIS — H353133 Nonexudative age-related macular degeneration, bilateral, advanced atrophic without subfoveal involvement: Secondary | ICD-10-CM | POA: Diagnosis not present

## 2018-11-29 DIAGNOSIS — L308 Other specified dermatitis: Secondary | ICD-10-CM | POA: Diagnosis not present

## 2018-12-12 ENCOUNTER — Encounter (HOSPITAL_COMMUNITY): Payer: Self-pay

## 2018-12-12 ENCOUNTER — Emergency Department (HOSPITAL_COMMUNITY): Payer: Medicare HMO

## 2018-12-12 ENCOUNTER — Other Ambulatory Visit: Payer: Self-pay

## 2018-12-12 ENCOUNTER — Inpatient Hospital Stay (HOSPITAL_COMMUNITY)
Admission: EM | Admit: 2018-12-12 | Discharge: 2018-12-22 | DRG: 183 | Disposition: A | Discharge status: Skilled Nursing Facility | Payer: Medicare HMO | Attending: Physician Assistant | Admitting: Physician Assistant

## 2018-12-12 DIAGNOSIS — F0390 Unspecified dementia without behavioral disturbance: Secondary | ICD-10-CM | POA: Diagnosis present

## 2018-12-12 DIAGNOSIS — Z91048 Other nonmedicinal substance allergy status: Secondary | ICD-10-CM

## 2018-12-12 DIAGNOSIS — I5032 Chronic diastolic (congestive) heart failure: Secondary | ICD-10-CM | POA: Diagnosis present

## 2018-12-12 DIAGNOSIS — S2241XA Multiple fractures of ribs, right side, initial encounter for closed fracture: Secondary | ICD-10-CM | POA: Diagnosis not present

## 2018-12-12 DIAGNOSIS — R079 Chest pain, unspecified: Secondary | ICD-10-CM | POA: Diagnosis not present

## 2018-12-12 DIAGNOSIS — S301XXA Contusion of abdominal wall, initial encounter: Secondary | ICD-10-CM | POA: Diagnosis present

## 2018-12-12 DIAGNOSIS — I10 Essential (primary) hypertension: Secondary | ICD-10-CM | POA: Diagnosis not present

## 2018-12-12 DIAGNOSIS — I130 Hypertensive heart and chronic kidney disease with heart failure and stage 1 through stage 4 chronic kidney disease, or unspecified chronic kidney disease: Secondary | ICD-10-CM | POA: Diagnosis present

## 2018-12-12 DIAGNOSIS — M311 Thrombotic microangiopathy: Secondary | ICD-10-CM | POA: Diagnosis present

## 2018-12-12 DIAGNOSIS — W19XXXA Unspecified fall, initial encounter: Secondary | ICD-10-CM

## 2018-12-12 DIAGNOSIS — S300XXA Contusion of lower back and pelvis, initial encounter: Secondary | ICD-10-CM | POA: Diagnosis present

## 2018-12-12 DIAGNOSIS — D62 Acute posthemorrhagic anemia: Secondary | ICD-10-CM | POA: Diagnosis present

## 2018-12-12 DIAGNOSIS — N179 Acute kidney failure, unspecified: Secondary | ICD-10-CM | POA: Diagnosis not present

## 2018-12-12 DIAGNOSIS — H9190 Unspecified hearing loss, unspecified ear: Secondary | ICD-10-CM | POA: Diagnosis present

## 2018-12-12 DIAGNOSIS — Z888 Allergy status to other drugs, medicaments and biological substances status: Secondary | ICD-10-CM

## 2018-12-12 DIAGNOSIS — Z86711 Personal history of pulmonary embolism: Secondary | ICD-10-CM

## 2018-12-12 DIAGNOSIS — S3992XA Unspecified injury of lower back, initial encounter: Secondary | ICD-10-CM | POA: Diagnosis not present

## 2018-12-12 DIAGNOSIS — W109XXA Fall (on) (from) unspecified stairs and steps, initial encounter: Secondary | ICD-10-CM | POA: Diagnosis present

## 2018-12-12 DIAGNOSIS — Z7902 Long term (current) use of antithrombotics/antiplatelets: Secondary | ICD-10-CM | POA: Diagnosis not present

## 2018-12-12 DIAGNOSIS — Z85828 Personal history of other malignant neoplasm of skin: Secondary | ICD-10-CM

## 2018-12-12 DIAGNOSIS — S80211A Abrasion, right knee, initial encounter: Secondary | ICD-10-CM | POA: Diagnosis present

## 2018-12-12 DIAGNOSIS — R4020 Unspecified coma: Secondary | ICD-10-CM | POA: Diagnosis not present

## 2018-12-12 DIAGNOSIS — Z20828 Contact with and (suspected) exposure to other viral communicable diseases: Secondary | ICD-10-CM | POA: Diagnosis not present

## 2018-12-12 DIAGNOSIS — K219 Gastro-esophageal reflux disease without esophagitis: Secondary | ICD-10-CM | POA: Diagnosis present

## 2018-12-12 DIAGNOSIS — Z87891 Personal history of nicotine dependence: Secondary | ICD-10-CM

## 2018-12-12 DIAGNOSIS — Z23 Encounter for immunization: Secondary | ICD-10-CM | POA: Diagnosis not present

## 2018-12-12 DIAGNOSIS — D649 Anemia, unspecified: Secondary | ICD-10-CM

## 2018-12-12 DIAGNOSIS — M2550 Pain in unspecified joint: Secondary | ICD-10-CM | POA: Diagnosis not present

## 2018-12-12 DIAGNOSIS — S50811A Abrasion of right forearm, initial encounter: Secondary | ICD-10-CM | POA: Diagnosis present

## 2018-12-12 DIAGNOSIS — M6281 Muscle weakness (generalized): Secondary | ICD-10-CM | POA: Diagnosis not present

## 2018-12-12 DIAGNOSIS — Z7982 Long term (current) use of aspirin: Secondary | ICD-10-CM

## 2018-12-12 DIAGNOSIS — T148XXA Other injury of unspecified body region, initial encounter: Secondary | ICD-10-CM | POA: Diagnosis present

## 2018-12-12 DIAGNOSIS — I739 Peripheral vascular disease, unspecified: Secondary | ICD-10-CM | POA: Diagnosis present

## 2018-12-12 DIAGNOSIS — Z9849 Cataract extraction status, unspecified eye: Secondary | ICD-10-CM | POA: Diagnosis not present

## 2018-12-12 DIAGNOSIS — M1990 Unspecified osteoarthritis, unspecified site: Secondary | ICD-10-CM | POA: Diagnosis present

## 2018-12-12 DIAGNOSIS — E785 Hyperlipidemia, unspecified: Secondary | ICD-10-CM | POA: Diagnosis present

## 2018-12-12 DIAGNOSIS — G4733 Obstructive sleep apnea (adult) (pediatric): Secondary | ICD-10-CM | POA: Diagnosis present

## 2018-12-12 DIAGNOSIS — Z881 Allergy status to other antibiotic agents status: Secondary | ICD-10-CM

## 2018-12-12 DIAGNOSIS — K5900 Constipation, unspecified: Secondary | ICD-10-CM | POA: Diagnosis present

## 2018-12-12 DIAGNOSIS — M549 Dorsalgia, unspecified: Secondary | ICD-10-CM | POA: Diagnosis not present

## 2018-12-12 DIAGNOSIS — Z1159 Encounter for screening for other viral diseases: Secondary | ICD-10-CM

## 2018-12-12 DIAGNOSIS — M25551 Pain in right hip: Secondary | ICD-10-CM | POA: Diagnosis not present

## 2018-12-12 DIAGNOSIS — S0003XA Contusion of scalp, initial encounter: Secondary | ICD-10-CM | POA: Diagnosis present

## 2018-12-12 DIAGNOSIS — S199XXA Unspecified injury of neck, initial encounter: Secondary | ICD-10-CM | POA: Diagnosis not present

## 2018-12-12 DIAGNOSIS — Y92009 Unspecified place in unspecified non-institutional (private) residence as the place of occurrence of the external cause: Secondary | ICD-10-CM

## 2018-12-12 DIAGNOSIS — R0902 Hypoxemia: Secondary | ICD-10-CM | POA: Diagnosis not present

## 2018-12-12 DIAGNOSIS — R41841 Cognitive communication deficit: Secondary | ICD-10-CM | POA: Diagnosis not present

## 2018-12-12 DIAGNOSIS — R531 Weakness: Secondary | ICD-10-CM | POA: Diagnosis not present

## 2018-12-12 DIAGNOSIS — N183 Chronic kidney disease, stage 3 (moderate): Secondary | ICD-10-CM | POA: Diagnosis present

## 2018-12-12 DIAGNOSIS — Z79899 Other long term (current) drug therapy: Secondary | ICD-10-CM

## 2018-12-12 DIAGNOSIS — S299XXA Unspecified injury of thorax, initial encounter: Secondary | ICD-10-CM | POA: Diagnosis not present

## 2018-12-12 DIAGNOSIS — I2510 Atherosclerotic heart disease of native coronary artery without angina pectoris: Secondary | ICD-10-CM | POA: Diagnosis present

## 2018-12-12 DIAGNOSIS — Z8249 Family history of ischemic heart disease and other diseases of the circulatory system: Secondary | ICD-10-CM

## 2018-12-12 DIAGNOSIS — Z974 Presence of external hearing-aid: Secondary | ICD-10-CM

## 2018-12-12 DIAGNOSIS — I959 Hypotension, unspecified: Secondary | ICD-10-CM | POA: Diagnosis not present

## 2018-12-12 DIAGNOSIS — Z955 Presence of coronary angioplasty implant and graft: Secondary | ICD-10-CM

## 2018-12-12 DIAGNOSIS — W19XXXD Unspecified fall, subsequent encounter: Secondary | ICD-10-CM | POA: Diagnosis not present

## 2018-12-12 DIAGNOSIS — Z7401 Bed confinement status: Secondary | ICD-10-CM | POA: Diagnosis not present

## 2018-12-12 DIAGNOSIS — R52 Pain, unspecified: Secondary | ICD-10-CM | POA: Diagnosis not present

## 2018-12-12 LAB — CBC WITH DIFFERENTIAL/PLATELET
Abs Immature Granulocytes: 0.35 10*3/uL — ABNORMAL HIGH (ref 0.00–0.07)
Basophils Absolute: 0.1 10*3/uL (ref 0.0–0.1)
Basophils Relative: 0 %
Eosinophils Absolute: 0.2 10*3/uL (ref 0.0–0.5)
Eosinophils Relative: 1 %
HCT: 29.9 % — ABNORMAL LOW (ref 39.0–52.0)
Hemoglobin: 9.3 g/dL — ABNORMAL LOW (ref 13.0–17.0)
Immature Granulocytes: 2 %
Lymphocytes Relative: 9 %
Lymphs Abs: 2.1 10*3/uL (ref 0.7–4.0)
MCH: 31.7 pg (ref 26.0–34.0)
MCHC: 31.1 g/dL (ref 30.0–36.0)
MCV: 102 fL — ABNORMAL HIGH (ref 80.0–100.0)
Monocytes Absolute: 2.5 10*3/uL — ABNORMAL HIGH (ref 0.1–1.0)
Monocytes Relative: 11 %
Neutro Abs: 18.6 10*3/uL — ABNORMAL HIGH (ref 1.7–7.7)
Neutrophils Relative %: 77 %
Platelets: 191 10*3/uL (ref 150–400)
RBC: 2.93 MIL/uL — ABNORMAL LOW (ref 4.22–5.81)
RDW: 14.5 % (ref 11.5–15.5)
WBC: 23.8 10*3/uL — ABNORMAL HIGH (ref 4.0–10.5)
nRBC: 0 % (ref 0.0–0.2)

## 2018-12-12 LAB — SARS CORONAVIRUS 2 BY RT PCR (HOSPITAL ORDER, PERFORMED IN ~~LOC~~ HOSPITAL LAB): SARS Coronavirus 2: NEGATIVE

## 2018-12-12 LAB — COMPREHENSIVE METABOLIC PANEL
ALT: 20 U/L (ref 0–44)
AST: 31 U/L (ref 15–41)
Albumin: 3 g/dL — ABNORMAL LOW (ref 3.5–5.0)
Alkaline Phosphatase: 58 U/L (ref 38–126)
Anion gap: 11 (ref 5–15)
BUN: 34 mg/dL — ABNORMAL HIGH (ref 8–23)
CO2: 16 mmol/L — ABNORMAL LOW (ref 22–32)
Calcium: 8 mg/dL — ABNORMAL LOW (ref 8.9–10.3)
Chloride: 109 mmol/L (ref 98–111)
Creatinine, Ser: 1.79 mg/dL — ABNORMAL HIGH (ref 0.61–1.24)
GFR calc Af Amer: 37 mL/min — ABNORMAL LOW (ref >60)
GFR calc non Af Amer: 32 mL/min — ABNORMAL LOW (ref >60)
Glucose, Bld: 154 mg/dL — ABNORMAL HIGH (ref 70–99)
Potassium: 4.4 mmol/L (ref 3.5–5.1)
Sodium: 136 mmol/L (ref 135–145)
Total Bilirubin: 0.7 mg/dL (ref 0.3–1.2)
Total Protein: 6.3 g/dL — ABNORMAL LOW (ref 6.5–8.1)

## 2018-12-12 LAB — LACTIC ACID, PLASMA: Lactic Acid, Venous: 1.7 mmol/L (ref 0.5–1.9)

## 2018-12-12 MED ORDER — TETANUS-DIPHTH-ACELL PERTUSSIS 5-2.5-18.5 LF-MCG/0.5 IM SUSP
0.5000 mL | Freq: Once | INTRAMUSCULAR | Status: AC
  Administered 2018-12-12: 19:00:00 0.5 mL via INTRAMUSCULAR
  Filled 2018-12-12: qty 0.5

## 2018-12-12 MED ORDER — IOHEXOL 300 MG/ML  SOLN
100.0000 mL | Freq: Once | INTRAMUSCULAR | Status: AC | PRN
  Administered 2018-12-12: 100 mL via INTRAVENOUS

## 2018-12-12 MED ORDER — MORPHINE SULFATE (PF) 4 MG/ML IV SOLN
4.0000 mg | INTRAVENOUS | Status: DC | PRN
  Administered 2018-12-12 – 2018-12-16 (×7): 4 mg via INTRAVENOUS
  Filled 2018-12-12 (×7): qty 1

## 2018-12-12 NOTE — ED Provider Notes (Signed)
[facility] EMERGENCY DEPARTMENT Provider Note   CSN: [id number] Arrival date & time: 12/12/18  1739    History   Chief Complaint Chief Complaint  Patient presents with   Fall    HPI Larry Huffman is a 85 y.o. male.     85 year old male presents for evaluation following reported fall.  Patient reports that he lost his balance and fell down a flight of stairs at home.  The stairs are concrete.  He thinks he may have fallen down up to 8 steps total.  He does not report head injury or loss of conscious.  He does complain of pain to the right flank and right hip.  He does have bruising and ecchymosis noted to the right flank.  Patient is extremely hard of hearing.  Additional history obtained from the patient's daughter Larry Huffman [phone]).  She confirms that the patient fell down the flight of stairs approximately 4 PM.  He was complaining of pain to the right flank and right hip.  He did not have any other significant obvious injury after the fall.  Patient does bruise easily and is taking both aspirin and Plavix.  She confirms that his mental status after the fall was at his baseline.  The history is provided by the patient, medical records and a relative.  Fall  This is a new problem. The current episode started 1 to 2 hours ago. The problem occurs constantly. The problem has not changed since onset.Pertinent negatives include no chest pain, no abdominal pain and no headaches. Nothing aggravates the symptoms. Nothing relieves the symptoms.    Past Medical History:  Diagnosis Date   Acid reflux    Arthritis    CAD (coronary artery disease)    Cancer (HCC) hx of skin cancer   Constipation    Coronary artery disease    High cholesterol    HOH (hard of hearing)    wears hearing aids, but still can not hear   Hypertension    Incontinence of urine    Peripheral vascular disease ([place])    Remote left carotid endarterectomy   Sleep apnea      CPAP    Patient Active Problem List   Diagnosis Date Noted   Long-term (current) use of anticoagulants 02/21/2015   Allergic rhinitis 01/24/2015   Malnutrition of moderate degree ([place]) 01/16/2015   Warfarin-induced coagulopathy ([place])    Melena    Severe anemia 01/15/2015   GI bleed 01/15/2015   Acute blood loss anemia 01/15/2015   Chronic anticoagulation 06/04/2014   Dementia ([place]) 04/19/2014   Chronic diastolic heart failure ([place]) 04/15/2014   Chronic kidney disease (CKD), stage III (moderate) (HCC) 04/15/2014   Pulmonary embolism ([place]) 04/13/2014   Spinal stenosis, lumbar region, with neurogenic claudication 02/08/2014    Class: Diagnosis of   Peripheral arterial disease: History of left carotid endarterectomy 03/15/2013   Obstructive sleep apnea: Uses CPAP 03/15/2013   Venous insufficiency 03/15/2013   Essential hypertension 03/15/2013   HLD (hyperlipidemia) 03/15/2013   Chest pain 07/28/2012   CAD - CFX DES 7/07, LAD DES 10/08, cath 11/11- medical Rx 07/28/2012    Past Surgical History:  Procedure Laterality Date   2-D echocardiogram  03/07/2008   Ejection fraction greater than 55%. Mild concentric left ventricular hypertrophy.LV relaxation. mildly dilated left atrium. Mild mitral regurgitation.    ANAL FISSURE REPAIR     CARDIAC CATHETERIZATION     X 2 stents   COLON SURGERY  removed part of the small intestine   COLONOSCOPY W/ POLYPECTOMY     CORONARY STENT PLACEMENT     ESOPHAGOGASTRODUODENOSCOPY N/A 01/16/2015   Procedure: ESOPHAGOGASTRODUODENOSCOPY (EGD);  Surgeon: Irene Shipper, MD;  Location: [facility] ENDOSCOPY;  Service: Endoscopy;  Laterality: N/A;   EYE SURGERY     cataracts   I&D EXTREMITY  06/28/2012   Procedure: IRRIGATION AND DEBRIDEMENT EXTREMITY;  Surgeon: Newt Minion, MD;  Location: [facility];  Service: Orthopedics;  Laterality: Right;  Debridement Wound Right Leg, VAC, Antibiotic Beads, Apply A-Cell   IRRIGATION AND  DEBRIDEMENT ABSCESS     LAPAROTOMY     LUMBAR LAMINECTOMY N/A 02/08/2014   Procedure: L3-4, L4-5 Decompression;  Surgeon: Marybelle Killings, MD;  Location: [facility];  Service: Orthopedics;  Laterality: N/A;  L3-4, L4-5 Decompression   NASAL SINUS SURGERY     PROSTATE SURGERY     ROTATOR CUFF REPAIR          Home Medications    Prior to Admission medications   Medication Sig Start Date End Date Taking? Authorizing Provider  acetaminophen (TYLENOL) 500 MG tablet Take 1,000 mg by mouth 2 (two) times daily as needed for mild pain.     [provider]  amLODipine (NORVASC) 10 MG tablet Take 10 mg by mouth daily.    [provider]  Ascorbic Acid (VITAMIN C) 1000 MG tablet Take 1,000 mg by mouth daily. 07/01/10   [provider]  aspirin EC 325 MG tablet Take 325 mg by mouth at bedtime.     [provider]  clobetasol (TEMOVATE) 0.05 % external solution Apply 1 application topically See admin instructions. 1 application every morning, and 1 application twice daily during break outs 11/03/15   [provider]  clobetasol cream (TEMOVATE) 7.02 % Apply 1 application topically 2 (two) times daily.    [provider]  clopidogrel (PLAVIX) 75 MG tablet Take 1 tablet (75 mg total) by mouth daily. 03/26/16   Lorretta Harp, MD  ferrous sulfate 325 (65 FE) MG tablet Take 325 mg by mouth daily with breakfast.    [provider]  loratadine (CLARITIN) 10 MG tablet Take 5 mg by mouth daily.    [provider]  montelukast (SINGULAIR) 10 MG tablet Take 10 mg by mouth at bedtime.    [provider]  Multiple Vitamin (MULTIVITAMIN WITH MINERALS) TABS Take 1 tablet by mouth daily. [employer] brand multivitamin    [provider]  Multiple Vitamins-Minerals (OCUVITE ADULT 50+ PO) Take 1 tablet by mouth daily.     [provider]  nitroGLYCERIN (NITROSTAT) 0.4 MG SL tablet Place 1 tablet (0.4 mg total) under the tongue  every 5 (five) minutes as needed for chest pain. 09/25/15   Lorretta Harp, MD  Omega-3 Fatty Acids (FISH OIL) 1200 MG CAPS Take 1,200 mg by mouth daily.     [provider]  pantoprazole (PROTONIX) 40 MG tablet Take 1 tablet (40 mg total) by mouth daily. 04/02/16   Lorretta Harp, MD  polyethylene glycol [facility] Floria Raveling) packet Take 17 g by mouth every morning.     [provider]  pyridOXINE (VITAMIN B-6) 50 MG tablet Take 50 mg by mouth daily.    [provider]  simvastatin (ZOCOR) 10 MG tablet Take 1 tablet (10 mg total) by mouth at bedtime. 04/02/16   Lorretta Harp, MD    Family History Family History  Problem Relation Age of Onset  Hypertension Mother     Social History Social History   Tobacco Use   Smoking status: Former Smoker    Types: Cigarettes    Last attempt to quit: 07/12/1978    Years since quitting: 40.4   Smokeless tobacco: Never Used  Substance Use Topics   Alcohol use: No   Drug use: No     Allergies   Ciprofloxacin; Tape; and Dapsone   Review of Systems Review of Systems  Cardiovascular: Negative for chest pain.  Gastrointestinal: Negative for abdominal pain.  Neurological: Negative for headaches.  All other systems reviewed and are negative.    Physical Exam Updated Vital Signs BP (!) 131/53    Pulse 61    Temp 98.3 F (36.8 C) (Oral)    Resp 16    SpO2 95%   Physical Exam Vitals signs and nursing note reviewed.  Constitutional:      General: He is not in acute distress.    Appearance: He is well-developed.  HENT:     Head: Normocephalic and atraumatic.  Eyes:     Conjunctiva/sclera: Conjunctivae normal.     Pupils: Pupils are equal, round, and reactive to light.  Neck:     Musculoskeletal: Normal range of motion and neck supple.  Cardiovascular:     Rate and Rhythm: Normal rate and regular rhythm.     Heart sounds: Normal heart sounds.  Pulmonary:     Effort: Pulmonary effort is normal. No  respiratory distress.     Breath sounds: Normal breath sounds.  Abdominal:     General: There is no distension.     Palpations: Abdomen is soft.     Tenderness: There is no abdominal tenderness.  Musculoskeletal: Normal range of motion.        General: No deformity.  Skin:    General: Skin is warm and dry.     Comments: Abrasions noted to the right anterior forearm.  Small abrasion noted to the right knee.  Large ecchymosis noted to the right posterior flank.  Neurological:     Mental Status: He is alert and oriented to person, place, and time.      ED Treatments / Results  Labs (all labs ordered are listed, but only abnormal results are displayed) Labs Reviewed  COMPREHENSIVE METABOLIC PANEL - Abnormal; Notable for the following components:      Result Value   CO2 16 (*)    Glucose, Bld 154 (*)    BUN 34 (*)    Creatinine, Ser 1.79 (*)    Calcium 8.0 (*)    Total Protein 6.3 (*)    Albumin 3.0 (*)    GFR calc non Af Amer 32 (*)    GFR calc Af Amer 37 (*)    All other components within normal limits  CBC WITH DIFFERENTIAL/PLATELET - Abnormal; Notable for the following components:   WBC 23.8 (*)    RBC 2.93 (*)    Hemoglobin 9.3 (*)    HCT 29.9 (*)    MCV 102.0 (*)    Neutro Abs 18.6 (*)    Monocytes Absolute 2.5 (*)    Abs Immature Granulocytes 0.35 (*)    All other components within normal limits  SARS CORONAVIRUS 2 (HOSPITAL ORDER, [place] [facility])  LACTIC ACID, PLASMA  LACTIC ACID, PLASMA  TYPE AND SCREEN    EKG EKG Interpretation  Date/Time:  Tuesday December 12 2018 17:48:20 EDT Ventricular Rate:  65 PR Interval:  204 QRS Duration:  92 QT Interval:  412 QTC Calculation: 428 R Axis:   -5 Text Interpretation:  Sinus rhythm with occasional Premature ventricular complexes Moderate voltage criteria for LVH, may be normal variant Borderline ECG Confirmed by Dene Gentry [phone]) on 12/12/2018 6:05:29 PM   Radiology Dg Chest 2  View  Result Date: 12/12/2018 CLINICAL DATA:  Unwitnessed fall. Pain. EXAM: CHEST - 2 VIEW COMPARISON:  10/14/2017. FINDINGS: The heart is enlarged. Mild vascular congestion. No consolidation or edema. No definite rib fracture on this portable semi erect chest. Calcified tortuous aorta. Minimal blunting at both lung bases. BILATERAL shoulder surgery. Similar appearance to priors. IMPRESSION: Cardiomegaly with mild vascular congestion. No consolidation or edema. Electronically Signed   By: Staci Righter M.D.   On: 12/12/2018 18:10   Ct Head Wo Contrast  Result Date: 12/12/2018 CLINICAL DATA:  Unwitnessed fall down 8 steps. Dementia. EXAM: CT HEAD WITHOUT CONTRAST CT CERVICAL SPINE WITHOUT CONTRAST TECHNIQUE: Multidetector CT imaging of the head and cervical spine was performed following the standard protocol without intravenous contrast. Multiplanar CT image reconstructions of the cervical spine were also generated. COMPARISON:  06/20/2018. FINDINGS: CT HEAD FINDINGS Brain: Stable moderately enlarged ventricles and subarachnoid spaces. Stable moderate patchy white matter low density in both cerebral hemispheres. 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 No intracranial hemorrhage, mass lesion or CT evidence of acute infarction. Vascular: No hyperdense vessel or unexpected calcification. Skull: Normal. Negative for fracture or focal lesion. Sinuses/Orbits: Status post bilateral cataract extraction. Unremarkable paranasal sinuses. Other: Right posterior scalp hematoma near the vertex. CT CERVICAL SPINE FINDINGS Alignment: Minimal anterolisthesis at the C2-3, C3-4, C4-5 levels. Mild retrolisthesis at the C5-6 level and mild anterolisthesis at the C6-7 level. Skull base and vertebrae: No acute fracture. No primary bone lesion or focal pathologic process. Soft tissues and spinal canal: No prevertebral fluid or swelling. No visible canal hematoma. Disc levels: Multilevel degenerative changes, most pronounced at the C5-6  level. Upper chest: Mild biapical pleural and parenchymal scarring some pleural calcification. Other: Bilateral carotid artery calcifications. IMPRESSION: 1. Right posterior scalp hematoma without skull fracture or intracranial hemorrhage. 2. 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 3. No cervical spine fracture or traumatic subluxation. 4. Multilevel cervical spine degenerative changes. 5. Bilateral carotid artery atheromatous calcifications. Electronically Signed   By: Claudie Revering M.D.   On: 12/12/2018 20:07   Ct Chest W Contrast  Result Date: 12/12/2018 CLINICAL DATA:  [address] downstairs.  Right-sided abdominal pain. EXAM: CT CHEST, ABDOMEN, AND PELVIS WITH CONTRAST TECHNIQUE: Multidetector CT imaging of the chest, abdomen and pelvis was performed following the standard protocol during bolus administration of intravenous contrast. CONTRAST:  169mL OMNIPAQUE IOHEXOL 300 MG/ML  SOLN COMPARISON:  None. FINDINGS: CT CHEST FINDINGS Cardiovascular: The heart is upper limits of normal in size for age. No pericardial effusion. There is tortuosity, ectasia and calcification of the thoracic aorta but no focal aneurysm or dissection. The branch vessels are patent. Moderate tortuosity and atherosclerotic calcifications. Three-vessel coronary artery calcifications. Mediastinum/Nodes: No mediastinal or hilar mass or adenopathy or hematoma. Small scattered lymph nodes are noted. The esophagus is grossly normal. Lungs/Pleura: Small bilateral pleural effusions with overlying atelectasis. No pulmonary contusion, pneumonia or pneumothorax. No worrisome pulmonary lesions. Musculoskeletal: The chest wall is unremarkable. No chest wall mass or hematoma. No supraclavicular or axillary adenopathy. There are mildly displaced right fourth and fifth lateral rib fractures. The sternum is intact. There is a T8 compression deformity but this is  stable when compared to prior  chest CT from 2015. No acute thoracic vertebral body fractures. CT ABDOMEN PELVIS FINDINGS Hepatobiliary: No acute hepatic injury. No perihepatic fluid collection. No worrisome hepatic lesions. Small gallstone noted the gallbladder. No common bile duct dilatation. Pancreas: Advanced pancreatic atrophy but no mass or acute inflammation. No acute injury. There is marked dilatation of the main pancreatic duct in the body region the pancreas and there may be an obstructing calculus. This appears stable since 2015. Spleen: No acute splenic injury.  No perisplenic fluid collection. Adrenals/Urinary Tract: In the adrenal glands are unremarkable. No acute renal injury is identified. Both kidneys demonstrate mild age-related renal cortical thinning. There are cysts bilaterally but no worrisome lesions. The bladder is unremarkable. Stomach/Bowel: The stomach, duodenum, small bowel and colon are grossly normal without oral contrast. No acute inflammatory changes, mass lesions or obstructive findings. Vascular/Lymphatic: Advanced atherosclerotic calcifications involving the aorta and branch vessels but no aneurysm or dissection. No mesenteric or retroperitoneal mass or adenopathy. Reproductive: The prostate gland and seminal vesicles are surgically absent. A penile prosthesis is noted with a reservoir near the right side of the bladder. Other: No free abdominal/pelvic fluid collections, hematoma or free air. Musculoskeletal: There is a moderate-sized hematoma in the right flank area with suspected active extravasation of contrast. Is also a large hematoma involving the right upper gluteal muscles with active extravasation of contrast in the upper gluteus medius muscle. The bony pelvis is intact. No pelvic or hip fractures are identified. There is a smooth depression involving the inferior endplate of L3 which is likely a Schmorl's node or old compression fracture. No definite acute compression fractures. Advanced facet disease  noted. IMPRESSION: 1. Mildly displaced right fourth and fifth lateral rib fractures with associated small pleural hematoma/pleural fluid. No pneumothorax is identified. 2. Small bilateral pleural effusions and overlying atelectasis but no pulmonary contusion or infiltrate. 3. The heart and great vessels are unremarkable except for advanced vascular calcifications. No acute aortic or branch vessel injury is identified. 4. Moderate-sized subcutaneous hematoma involving the right flank area and also moderate-sized hematoma involving the right upper gluteal muscles. Both these areas demonstrate active extravasation of contrast material. 5. No definite spine, pelvis or hip fractures. 6. No CT findings suspicious for solid organ injury in the abdomen or pelvis. Electronically Signed   By: Marijo Sanes M.D.   On: 12/12/2018 20:15   Ct Cervical Spine Wo Contrast  Result Date: 12/12/2018 CLINICAL DATA:  Unwitnessed fall down 8 steps. Dementia. EXAM: CT HEAD WITHOUT CONTRAST CT CERVICAL SPINE WITHOUT CONTRAST TECHNIQUE: Multidetector CT imaging of the head and cervical spine was performed following the standard protocol without intravenous contrast. Multiplanar CT image reconstructions of the cervical spine were also generated. COMPARISON:  06/20/2018. FINDINGS: CT HEAD FINDINGS Brain: Stable moderately enlarged ventricles and subarachnoid spaces. Stable moderate patchy white matter low density in both cerebral hemispheres. 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 No intracranial hemorrhage, mass lesion or CT evidence of acute infarction. Vascular: No hyperdense vessel or unexpected calcification. Skull: Normal. Negative for fracture or focal lesion. Sinuses/Orbits: Status post bilateral cataract extraction. Unremarkable paranasal sinuses. Other: Right posterior scalp hematoma near the vertex. CT CERVICAL SPINE FINDINGS Alignment: Minimal anterolisthesis at the C2-3, C3-4, C4-5 levels. Mild retrolisthesis at the C5-6  level and mild anterolisthesis at the C6-7 level. Skull base and vertebrae: No acute fracture. No primary bone lesion or focal pathologic process. Soft tissues and spinal canal: No prevertebral fluid or swelling. No visible canal  hematoma. Disc levels: Multilevel degenerative changes, most pronounced at the C5-6 level. Upper chest: Mild biapical pleural and parenchymal scarring some pleural calcification. Other: Bilateral carotid artery calcifications. IMPRESSION: 1. Right posterior scalp hematoma without skull fracture or intracranial hemorrhage. 2. 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 3. No cervical spine fracture or traumatic subluxation. 4. Multilevel cervical spine degenerative changes. 5. Bilateral carotid artery atheromatous calcifications. Electronically Signed   By: Claudie Revering M.D.   On: 12/12/2018 20:07   Ct Lumbar Spine Wo Contrast  Result Date: 12/12/2018 CLINICAL DATA:  [place].  Back pain. EXAM: CT LUMBAR SPINE WITHOUT CONTRAST TECHNIQUE: Multidetector CT imaging of the lumbar spine was performed without intravenous contrast administration. Multiplanar CT image reconstructions were also generated. COMPARISON:  Lumbar spine radiographs 10/14/2017 FINDINGS: Segmentation: There are five lumbar type vertebral bodies. The last full intervertebral disc space is labeled L5-S1. Alignment: Degenerative lumbar spondylosis with degenerative retrolisthesis of L4 which appears stable since prior radiographs. Vertebrae: Remote/stable inferior endplate depression of L3 which could be a Schmorl's node or old compression fracture. Moderate osteoporosis but no definite acute lumbar fractures. Advanced facet disease is noted in the spine and is evidence of prior lumbar surgery with right-sided laminectomy at L3-4 and wide laminectomy at L4-5. The visualized bony pelvis is intact. Paraspinal and other soft tissues: No significant findings. Advanced  atherosclerotic calcifications are noted involving the aorta and branch vessels. Renal cysts are also noted. Disc levels: No large disc protrusions are identified. No significant spinal or foraminal stenosis. IMPRESSION: 1. No acute lumbar fractures. 2. Remote inferior endplate compression deformity of L3. 3. Degenerative lumbar spondylosis with multilevel disc disease and facet disease and postoperative changes at L3-4 and L4-5. Electronically Signed   By: Marijo Sanes M.D.   On: 12/12/2018 20:21   Ct Abdomen Pelvis W Contrast  Result Date: 12/12/2018 CLINICAL DATA:  [place] downstairs.  Right-sided abdominal pain. EXAM: CT CHEST, ABDOMEN, AND PELVIS WITH CONTRAST TECHNIQUE: Multidetector CT imaging of the chest, abdomen and pelvis was performed following the standard protocol during bolus administration of intravenous contrast. CONTRAST:  172mL OMNIPAQUE IOHEXOL 300 MG/ML  SOLN COMPARISON:  None. FINDINGS: CT CHEST FINDINGS Cardiovascular: The heart is upper limits of normal in size for age. No pericardial effusion. There is tortuosity, ectasia and calcification of the thoracic aorta but no focal aneurysm or dissection. The branch vessels are patent. Moderate tortuosity and atherosclerotic calcifications. Three-vessel coronary artery calcifications. Mediastinum/Nodes: No mediastinal or hilar mass or adenopathy or hematoma. Small scattered lymph nodes are noted. The esophagus is grossly normal. Lungs/Pleura: Small bilateral pleural effusions with overlying atelectasis. No pulmonary contusion, pneumonia or pneumothorax. No worrisome pulmonary lesions. Musculoskeletal: The chest wall is unremarkable. No chest wall mass or hematoma. No supraclavicular or axillary adenopathy. There are mildly displaced right fourth and fifth lateral rib fractures. The sternum is intact. There is a T8 compression deformity but this is stable when compared to prior chest CT from 2015. No acute thoracic vertebral body fractures. CT  ABDOMEN PELVIS FINDINGS Hepatobiliary: No acute hepatic injury. No perihepatic fluid collection. No worrisome hepatic lesions. Small gallstone noted the gallbladder. No common bile duct dilatation. Pancreas: Advanced pancreatic atrophy but no mass or acute inflammation. No acute injury. There is marked dilatation of the main pancreatic duct in the body region the pancreas and there may be an obstructing calculus. This appears stable since 2015. Spleen: No acute splenic injury.  No perisplenic fluid collection. Adrenals/Urinary  Tract: In the adrenal glands are unremarkable. No acute renal injury is identified. Both kidneys demonstrate mild age-related renal cortical thinning. There are cysts bilaterally but no worrisome lesions. The bladder is unremarkable. Stomach/Bowel: The stomach, duodenum, small bowel and colon are grossly normal without oral contrast. No acute inflammatory changes, mass lesions or obstructive findings. Vascular/Lymphatic: Advanced atherosclerotic calcifications involving the aorta and branch vessels but no aneurysm or dissection. No mesenteric or retroperitoneal mass or adenopathy. Reproductive: The prostate gland and seminal vesicles are surgically absent. A penile prosthesis is noted with a reservoir near the right side of the bladder. Other: No free abdominal/pelvic fluid collections, hematoma or free air. Musculoskeletal: There is a moderate-sized hematoma in the right flank area with suspected active extravasation of contrast. Is also a large hematoma involving the right upper gluteal muscles with active extravasation of contrast in the upper gluteus medius muscle. The bony pelvis is intact. No pelvic or hip fractures are identified. There is a smooth depression involving the inferior endplate of L3 which is likely a Schmorl's node or old compression fracture. No definite acute compression fractures. Advanced facet disease noted. IMPRESSION: 1. Mildly displaced right fourth and fifth  lateral rib fractures with associated small pleural hematoma/pleural fluid. No pneumothorax is identified. 2. Small bilateral pleural effusions and overlying atelectasis but no pulmonary contusion or infiltrate. 3. The heart and great vessels are unremarkable except for advanced vascular calcifications. No acute aortic or branch vessel injury is identified. 4. Moderate-sized subcutaneous hematoma involving the right flank area and also moderate-sized hematoma involving the right upper gluteal muscles. Both these areas demonstrate active extravasation of contrast material. 5. No definite spine, pelvis or hip fractures. 6. No CT findings suspicious for solid organ injury in the abdomen or pelvis. Electronically Signed   By: Marijo Sanes M.D.   On: 12/12/2018 20:15    Procedures Procedures (including critical care time) CRITICAL CARE Performed by: Valarie Merino   Total critical care time: 30 minutes  Critical care time was exclusive of separately billable procedures and treating other patients.  Critical care was necessary to treat or prevent imminent or life-threatening deterioration.  Critical care was time spent personally by me on the following activities: development of treatment plan with patient and/or surrogate as well as nursing, discussions with consultants, evaluation of patient's response to treatment, examination of patient, obtaining history from patient or surrogate, ordering and performing treatments and interventions, ordering and review of laboratory studies, ordering and review of radiographic studies, pulse oximetry and re-evaluation of patient's condition.   Medications Ordered in ED Medications  Tdap (BOOSTRIX) injection 0.5 mL (has no administration in time range)     Initial Impression / Assessment and Plan / ED Course  I have reviewed the triage vital signs and the nursing notes.  Pertinent labs & imaging results that were available during my care of the patient  were reviewed by me and considered in my medical decision making (see chart for details).        MDM  Screen complete  Larry Huffman was evaluated in Emergency Department on 12/12/2018 for the symptoms described in the history of present illness. He was evaluated in the context of the global COVID-19 pandemic, which necessitated consideration that the patient might be at risk for infection with the SARS-CoV-2 virus that causes COVID-19. Institutional protocols and algorithms that pertain to the evaluation of patients at risk for COVID-19 are in a state of rapid change based on information released by regulatory  bodies including the CDC and federal and state organizations. These policies and algorithms were followed during the patient's care in the ED.  Patient is presenting for evaluation following reported fall.  He has pain to the right flank.  Significant ecchymosis noted to the right flank area.  CT imaging does not reveal acute pathology other than to right rib fractures.  He does have active extravasation of contrast into the right flank and right upper glue.  Vitals appear to be stable at time of admission.  Case discussed with Dr. Grandville Silos of trauma surgery.  He will admit the patient for further observation and possible intervention.  Family of patient Larry Huffman, [phone] - is aware of case and understands plan of care.  Final Clinical Impressions(s) / ED Diagnoses   Final diagnoses:  Fall, initial encounter  Closed fracture of multiple ribs of right side, initial encounter  Anemia, unspecified type    ED Discharge Orders    None       Valarie Merino, MD 12/12/18 2041

## 2018-12-12 NOTE — ED Triage Notes (Signed)
Per [facility], pt from home after unwtinessed fall down 8 steps. Pt has obvious injury to right lower abd/right flank where hard area is forming a possible hematoma. Pt has pain to same area. Pt has dementia, axox2. VSS.

## 2018-12-12 NOTE — H&P (Signed)
Larry Huffman is an 85 y.o. male.   Chief Complaint: fall HPI: Larry Huffman with an unwitnessed fall down approximately 8 steps. Unknown LOC. Larry Huffman has very poor short term memory. Larry Huffman was evaluated in the ED as a non-trauma code. Larry Huffman was found to have R rib FX X 2 and a large R flank and buttock hematoma. I was asked to admit him to the Trauma Service.  Past Medical History:  Diagnosis Date   Acid reflux    Arthritis    CAD (coronary artery disease)    Cancer (HCC) hx of skin cancer   Constipation    Coronary artery disease    High cholesterol    HOH (hard of hearing)    wears hearing aids, but still can not hear   Hypertension    Incontinence of urine    Peripheral vascular disease ([place])    Remote left carotid endarterectomy   Sleep apnea    CPAP    Past Surgical History:  Procedure Laterality Date   2-D echocardiogram  03/07/2008   Ejection fraction greater than 55%. Mild concentric left ventricular hypertrophy.LV relaxation. mildly dilated left atrium. Mild mitral regurgitation.    ANAL FISSURE REPAIR     CARDIAC CATHETERIZATION     X 2 stents   COLON SURGERY     removed part of the small intestine   COLONOSCOPY W/ POLYPECTOMY     CORONARY STENT PLACEMENT     ESOPHAGOGASTRODUODENOSCOPY N/A 01/16/2015   Procedure: ESOPHAGOGASTRODUODENOSCOPY (EGD);  Surgeon: Irene Shipper, MD;  Location: [facility] ENDOSCOPY;  Service: Endoscopy;  Laterality: N/A;   EYE SURGERY     cataracts   I&D EXTREMITY  06/28/2012   Procedure: IRRIGATION AND DEBRIDEMENT EXTREMITY;  Surgeon: Newt Minion, MD;  Location: [facility];  Service: Orthopedics;  Laterality: Right;  Debridement Wound Right Leg, VAC, Antibiotic Beads, Apply A-Cell   IRRIGATION AND DEBRIDEMENT ABSCESS     LAPAROTOMY     LUMBAR LAMINECTOMY N/A 02/08/2014   Procedure: L3-4, L4-5 Decompression;  Surgeon: Marybelle Killings, MD;  Location: [place];  Service: Orthopedics;  Laterality: N/A;  L3-4, L4-5 Decompression   NASAL SINUS SURGERY      PROSTATE SURGERY     ROTATOR CUFF REPAIR      Family History  Problem Relation Age of Onset   Hypertension Mother    Social History:  reports that Larry Huffman quit smoking about 40 years ago. His smoking use included cigarettes. Larry Huffman has never used smokeless tobacco. Larry Huffman reports that Larry Huffman does not drink alcohol or use drugs.  Allergies:  Allergies  Allergen Reactions   Ciprofloxacin Rash   Tape Other (See Comments)    Pulls skin off.  Please use "paper tape" only.    Dapsone Other (See Comments)    Lowers WBC count?? "made me very sick"    (Not in a hospital admission)   Results for orders placed or performed during the hospital encounter of 12/12/18 (from the past 48 hour(s))  Lactic acid, plasma     Status: None   Collection Time: 12/12/18  5:51 PM  Result Value Ref Range   Lactic Acid, Venous 1.7 0.5 - 1.9 mmol/L    Comment: Performed at [facility], [address] [place], [place] [address]  Comprehensive metabolic panel     Status: Abnormal   Collection Time: 12/12/18  5:51 PM  Result Value Ref Range   Sodium 136 135 - 145 mmol/L   Potassium 4.4 3.5 -  5.1 mmol/L   Chloride 109 98 - 111 mmol/L   CO2 16 (L) 22 - 32 mmol/L   Glucose, Bld 154 (H) 70 - 99 mg/dL   BUN 34 (H) 8 - 23 mg/dL   Creatinine, Ser 1.79 (H) 0.61 - 1.24 mg/dL   Calcium 8.0 (L) 8.9 - 10.3 mg/dL   Total Protein 6.3 (L) 6.5 - 8.1 g/dL   Albumin 3.0 (L) 3.5 - 5.0 g/dL   AST 31 15 - 41 U/L   ALT 20 0 - 44 U/L   Alkaline Phosphatase 58 38 - 126 U/L   Total Bilirubin 0.7 0.3 - 1.2 mg/dL   GFR calc non Af Amer 32 (L) >60 mL/min   GFR calc Af Amer 37 (L) >60 mL/min   Anion gap 11 5 - 15    Comment: Performed at [facility], [address], [place], [place] [address]  CBC with Differential     Status: Abnormal   Collection Time: 12/12/18  5:51 PM  Result Value Ref Range   WBC 23.8 (H) 4.0 - 10.5 K/uL   RBC 2.93 (L) 4.22 - 5.81 MIL/uL   Hemoglobin 9.3 (L) 13.0 - 17.0 g/dL   HCT 29.9 (L) 39.0 -  52.0 %   MCV 102.0 (H) 80.0 - 100.0 fL   MCH 31.7 26.0 - 34.0 pg   MCHC 31.1 30.0 - 36.0 g/dL   RDW 14.5 11.5 - 15.5 %   Platelets 191 150 - 400 K/uL   nRBC 0.0 0.0 - 0.2 %   Neutrophils Relative % 77 %   Neutro Abs 18.6 (H) 1.7 - 7.7 K/uL   Lymphocytes Relative 9 %   Lymphs Abs 2.1 0.7 - 4.0 K/uL   Monocytes Relative 11 %   Monocytes Absolute 2.5 (H) 0.1 - 1.0 K/uL   Eosinophils Relative 1 %   Eosinophils Absolute 0.2 0.0 - 0.5 K/uL   Basophils Relative 0 %   Basophils Absolute 0.1 0.0 - 0.1 K/uL   Immature Granulocytes 2 %   Abs Immature Granulocytes 0.35 (H) 0.00 - 0.07 K/uL    Comment: Performed at [facility], [address] [place], [place] [address]  Type and screen [place]     Status: None (Preliminary result)   Collection Time: 12/12/18  5:51 PM  Result Value Ref Range   ABO/RH(D) PENDING    Antibody Screen PENDING    Sample Expiration      12/15/2018,2359 Performed at [facility], [address] [place], [place] [address]    Dg Chest 2 View  Result Date: 12/12/2018 CLINICAL DATA:  Unwitnessed fall. Pain. EXAM: CHEST - 2 VIEW COMPARISON:  10/14/2017. FINDINGS: The heart is enlarged. Mild vascular congestion. No consolidation or edema. No definite rib fracture on this portable semi erect chest. Calcified tortuous aorta. Minimal blunting at both lung bases. BILATERAL shoulder surgery. Similar appearance to priors. IMPRESSION: Cardiomegaly with mild vascular congestion. No consolidation or edema. Electronically Signed   By: Staci Righter Huffman.D.   On: 12/12/2018 18:10   Ct Head Wo Contrast  Result Date: 12/12/2018 CLINICAL DATA:  Unwitnessed fall down 8 steps. Larry Huffman. EXAM: CT HEAD WITHOUT CONTRAST CT CERVICAL SPINE WITHOUT CONTRAST TECHNIQUE: Multidetector CT imaging of the head and cervical spine was performed following the standard protocol without intravenous contrast. Multiplanar CT image reconstructions of the cervical spine were also  generated. COMPARISON:  06/20/2018. FINDINGS: CT HEAD FINDINGS Brain: Stable moderately enlarged ventricles and  subarachnoid spaces. Stable moderate patchy white matter low density in both cerebral hemispheres. 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 No intracranial hemorrhage, mass lesion or CT evidence of acute infarction. Vascular: No hyperdense vessel or unexpected calcification. Skull: Normal. Negative for fracture or focal lesion. Sinuses/Orbits: Status post bilateral cataract extraction. Unremarkable paranasal sinuses. Other: Right posterior scalp hematoma near the vertex. CT CERVICAL SPINE FINDINGS Alignment: Minimal anterolisthesis at the C2-3, C3-4, C4-5 levels. Mild retrolisthesis at the C5-6 level and mild anterolisthesis at the C6-7 level. Skull base and vertebrae: No acute fracture. No primary bone lesion or focal pathologic process. Soft tissues and spinal canal: No prevertebral fluid or swelling. No visible canal hematoma. Disc levels: Multilevel degenerative changes, most pronounced at the C5-6 level. Upper chest: Mild biapical pleural and parenchymal scarring some pleural calcification. Other: Bilateral carotid artery calcifications. IMPRESSION: 1. Right posterior scalp hematoma without skull fracture or intracranial hemorrhage. 2. 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 3. No cervical spine fracture or traumatic subluxation. 4. Multilevel cervical spine degenerative changes. 5. Bilateral carotid artery atheromatous calcifications. Electronically Signed   By: Claudie Revering Huffman.D.   On: 12/12/2018 20:07   Ct Chest W Contrast  Result Date: 12/12/2018 CLINICAL DATA:  [address] downstairs.  Right-sided abdominal pain. EXAM: CT CHEST, ABDOMEN, AND PELVIS WITH CONTRAST TECHNIQUE: Multidetector CT imaging of the chest, abdomen and pelvis was performed following the standard protocol during bolus administration of intravenous contrast. CONTRAST:   143mL OMNIPAQUE IOHEXOL 300 MG/ML  SOLN COMPARISON:  None. FINDINGS: CT CHEST FINDINGS Cardiovascular: The heart is upper limits of normal in size for age. No pericardial effusion. There is tortuosity, ectasia and calcification of the thoracic aorta but no focal aneurysm or dissection. The branch vessels are patent. Moderate tortuosity and atherosclerotic calcifications. Three-vessel coronary artery calcifications. Mediastinum/Nodes: No mediastinal or hilar mass or adenopathy or hematoma. Small scattered lymph nodes are noted. The esophagus is grossly normal. Lungs/Pleura: Small bilateral pleural effusions with overlying atelectasis. No pulmonary contusion, pneumonia or pneumothorax. No worrisome pulmonary lesions. Musculoskeletal: The chest wall is unremarkable. No chest wall mass or hematoma. No supraclavicular or axillary adenopathy. There are mildly displaced right fourth and fifth lateral rib fractures. The sternum is intact. There is a T8 compression deformity but this is stable when compared to prior chest CT from 2015. No acute thoracic vertebral body fractures. CT ABDOMEN PELVIS FINDINGS Hepatobiliary: No acute hepatic injury. No perihepatic fluid collection. No worrisome hepatic lesions. Small gallstone noted the gallbladder. No common bile duct dilatation. Pancreas: Advanced pancreatic atrophy but no mass or acute inflammation. No acute injury. There is marked dilatation of the main pancreatic duct in the body region the pancreas and there may be an obstructing calculus. This appears stable since 2015. Spleen: No acute splenic injury.  No perisplenic fluid collection. Adrenals/Urinary Tract: In the adrenal glands are unremarkable. No acute renal injury is identified. Both kidneys demonstrate mild age-related renal cortical thinning. There are cysts bilaterally but no worrisome lesions. The bladder is unremarkable. Stomach/Bowel: The stomach, duodenum, small bowel and colon are grossly normal without oral  contrast. No acute inflammatory changes, mass lesions or obstructive findings. Vascular/Lymphatic: Advanced atherosclerotic calcifications involving the aorta and branch vessels but no aneurysm or dissection. No mesenteric or retroperitoneal mass or adenopathy. Reproductive: The prostate gland and seminal vesicles are surgically absent. A penile prosthesis is noted with a reservoir near the right side of the bladder. Other: No free abdominal/pelvic fluid collections,  hematoma or free air. Musculoskeletal: There is a moderate-sized hematoma in the right flank area with suspected active extravasation of contrast. Is also a large hematoma involving the right upper gluteal muscles with active extravasation of contrast in the upper gluteus medius muscle. The bony pelvis is intact. No pelvic or hip fractures are identified. There is a smooth depression involving the inferior endplate of L3 which is likely a Schmorl's node or old compression fracture. No definite acute compression fractures. Advanced facet disease noted. IMPRESSION: 1. Mildly displaced right fourth and fifth lateral rib fractures with associated small pleural hematoma/pleural fluid. No pneumothorax is identified. 2. Small bilateral pleural effusions and overlying atelectasis but no pulmonary contusion or infiltrate. 3. The heart and great vessels are unremarkable except for advanced vascular calcifications. No acute aortic or branch vessel injury is identified. 4. Moderate-sized subcutaneous hematoma involving the right flank area and also moderate-sized hematoma involving the right upper gluteal muscles. Both these areas demonstrate active extravasation of contrast material. 5. No definite spine, pelvis or hip fractures. 6. No CT findings suspicious for solid organ injury in the abdomen or pelvis. Electronically Signed   By: Marijo Sanes Huffman.D.   On: 12/12/2018 20:15   Ct Cervical Spine Wo Contrast  Result Date: 12/12/2018 CLINICAL DATA:  Unwitnessed  fall down 8 steps. Larry Huffman. EXAM: CT HEAD WITHOUT CONTRAST CT CERVICAL SPINE WITHOUT CONTRAST TECHNIQUE: Multidetector CT imaging of the head and cervical spine was performed following the standard protocol without intravenous contrast. Multiplanar CT image reconstructions of the cervical spine were also generated. COMPARISON:  06/20/2018. FINDINGS: CT HEAD FINDINGS Brain: Stable moderately enlarged ventricles and subarachnoid spaces. Stable moderate patchy white matter low density in both cerebral hemispheres. 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 No intracranial hemorrhage, mass lesion or CT evidence of acute infarction. Vascular: No hyperdense vessel or unexpected calcification. Skull: Normal. Negative for fracture or focal lesion. Sinuses/Orbits: Status post bilateral cataract extraction. Unremarkable paranasal sinuses. Other: Right posterior scalp hematoma near the vertex. CT CERVICAL SPINE FINDINGS Alignment: Minimal anterolisthesis at the C2-3, C3-4, C4-5 levels. Mild retrolisthesis at the C5-6 level and mild anterolisthesis at the C6-7 level. Skull base and vertebrae: No acute fracture. No primary bone lesion or focal pathologic process. Soft tissues and spinal canal: No prevertebral fluid or swelling. No visible canal hematoma. Disc levels: Multilevel degenerative changes, most pronounced at the C5-6 level. Upper chest: Mild biapical pleural and parenchymal scarring some pleural calcification. Other: Bilateral carotid artery calcifications. IMPRESSION: 1. Right posterior scalp hematoma without skull fracture or intracranial hemorrhage. 2. 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 3. No cervical spine fracture or traumatic subluxation. 4. Multilevel cervical spine degenerative changes. 5. Bilateral carotid artery atheromatous calcifications. Electronically Signed   By: Claudie Revering Huffman.D.   On: 12/12/2018 20:07   Ct Lumbar Spine Wo  Contrast  Result Date: 12/12/2018 CLINICAL DATA:  [place].  Back pain. EXAM: CT LUMBAR SPINE WITHOUT CONTRAST TECHNIQUE: Multidetector CT imaging of the lumbar spine was performed without intravenous contrast administration. Multiplanar CT image reconstructions were also generated. COMPARISON:  Lumbar spine radiographs 10/14/2017 FINDINGS: Segmentation: There are five lumbar type vertebral bodies. The last full intervertebral disc space is labeled L5-S1. Alignment: Degenerative lumbar spondylosis with degenerative retrolisthesis of L4 which appears stable since prior radiographs. Vertebrae: Remote/stable inferior endplate depression of L3 which could be a Schmorl's node or old compression fracture. Moderate osteoporosis but no definite acute lumbar fractures. Advanced facet disease is  noted in the spine and is evidence of prior lumbar surgery with right-sided laminectomy at L3-4 and wide laminectomy at L4-5. The visualized bony pelvis is intact. Paraspinal and other soft tissues: No significant findings. Advanced atherosclerotic calcifications are noted involving the aorta and branch vessels. Renal cysts are also noted. Disc levels: No large disc protrusions are identified. No significant spinal or foraminal stenosis. IMPRESSION: 1. No acute lumbar fractures. 2. Remote inferior endplate compression deformity of L3. 3. Degenerative lumbar spondylosis with multilevel disc disease and facet disease and postoperative changes at L3-4 and L4-5. Electronically Signed   By: Marijo Sanes Huffman.D.   On: 12/12/2018 20:21   Ct Abdomen Pelvis W Contrast  Result Date: 12/12/2018 CLINICAL DATA:  [address] downstairs.  Right-sided abdominal pain. EXAM: CT CHEST, ABDOMEN, AND PELVIS WITH CONTRAST TECHNIQUE: Multidetector CT imaging of the chest, abdomen and pelvis was performed following the standard protocol during bolus administration of intravenous contrast. CONTRAST:  138mL OMNIPAQUE IOHEXOL 300 MG/ML  SOLN COMPARISON:  None. FINDINGS:  CT CHEST FINDINGS Cardiovascular: The heart is upper limits of normal in size for age. No pericardial effusion. There is tortuosity, ectasia and calcification of the thoracic aorta but no focal aneurysm or dissection. The branch vessels are patent. Moderate tortuosity and atherosclerotic calcifications. Three-vessel coronary artery calcifications. Mediastinum/Nodes: No mediastinal or hilar mass or adenopathy or hematoma. Small scattered lymph nodes are noted. The esophagus is grossly normal. Lungs/Pleura: Small bilateral pleural effusions with overlying atelectasis. No pulmonary contusion, pneumonia or pneumothorax. No worrisome pulmonary lesions. Musculoskeletal: The chest wall is unremarkable. No chest wall mass or hematoma. No supraclavicular or axillary adenopathy. There are mildly displaced right fourth and fifth lateral rib fractures. The sternum is intact. There is a T8 compression deformity but this is stable when compared to prior chest CT from 2015. No acute thoracic vertebral body fractures. CT ABDOMEN PELVIS FINDINGS Hepatobiliary: No acute hepatic injury. No perihepatic fluid collection. No worrisome hepatic lesions. Small gallstone noted the gallbladder. No common bile duct dilatation. Pancreas: Advanced pancreatic atrophy but no mass or acute inflammation. No acute injury. There is marked dilatation of the main pancreatic duct in the body region the pancreas and there may be an obstructing calculus. This appears stable since 2015. Spleen: No acute splenic injury.  No perisplenic fluid collection. Adrenals/Urinary Tract: In the adrenal glands are unremarkable. No acute renal injury is identified. Both kidneys demonstrate mild age-related renal cortical thinning. There are cysts bilaterally but no worrisome lesions. The bladder is unremarkable. Stomach/Bowel: The stomach, duodenum, small bowel and colon are grossly normal without oral contrast. No acute inflammatory changes, mass lesions or obstructive  findings. Vascular/Lymphatic: Advanced atherosclerotic calcifications involving the aorta and branch vessels but no aneurysm or dissection. No mesenteric or retroperitoneal mass or adenopathy. Reproductive: The prostate gland and seminal vesicles are surgically absent. A penile prosthesis is noted with a reservoir near the right side of the bladder. Other: No free abdominal/pelvic fluid collections, hematoma or free air. Musculoskeletal: There is a moderate-sized hematoma in the right flank area with suspected active extravasation of contrast. Is also a large hematoma involving the right upper gluteal muscles with active extravasation of contrast in the upper gluteus medius muscle. The bony pelvis is intact. No pelvic or hip fractures are identified. There is a smooth depression involving the inferior endplate of L3 which is likely a Schmorl's node or old compression fracture. No definite acute compression fractures. Advanced facet disease noted. IMPRESSION: 1. Mildly displaced right fourth and fifth lateral  rib fractures with associated small pleural hematoma/pleural fluid. No pneumothorax is identified. 2. Small bilateral pleural effusions and overlying atelectasis but no pulmonary contusion or infiltrate. 3. The heart and great vessels are unremarkable except for advanced vascular calcifications. No acute aortic or branch vessel injury is identified. 4. Moderate-sized subcutaneous hematoma involving the right flank area and also moderate-sized hematoma involving the right upper gluteal muscles. Both these areas demonstrate active extravasation of contrast material. 5. No definite spine, pelvis or hip fractures. 6. No CT findings suspicious for solid organ injury in the abdomen or pelvis. Electronically Signed   By: Marijo Sanes Huffman.D.   On: 12/12/2018 20:15    Review of Systems  Constitutional: Negative for chills and fever.  HENT: Positive for hearing loss.   Eyes: Negative for blurred vision.   Respiratory: Negative for shortness of breath.   Cardiovascular: Positive for chest pain.  Gastrointestinal: Negative for abdominal pain, nausea and vomiting.  Genitourinary: Positive for flank pain.  Musculoskeletal: Positive for back pain.  Skin: Negative.   Neurological:       Memory loss  Endo/Heme/Allergies: Bruises/bleeds easily.  Psychiatric/Behavioral: Negative.     Blood pressure (!) 164/58, pulse 61, temperature 98.3 F (36.8 C), temperature source Oral, resp. rate 17, SpO2 95 %. Physical Exam  Constitutional: Larry Huffman appears well-developed and well-nourished. No distress.  HENT:  Right Ear: External ear normal.  Left Ear: External ear normal.  Nose: Nose normal.  R posterior scalp hematoma  Eyes: Pupils are equal, round, and reactive to light. EOM are normal.  Neck:  No posterior midline tenderness, no pain on AROM  Cardiovascular: Normal rate, regular rhythm and normal heart sounds.  Respiratory: Effort normal and breath sounds normal. No respiratory distress. Larry Huffman has no wheezes. Larry Huffman has no rales.     Larry Huffman exhibits tenderness.  GI: Soft. Larry Huffman exhibits no distension. There is no abdominal tenderness. There is no rebound and no guarding.  Hematoma R flank and buttock with tenderness and ecchymosis  Musculoskeletal: Normal range of motion.     Comments: Contusion R shoulder  Neurological: Larry Huffman is alert. Larry Huffman displays no atrophy and no tremor. Larry Huffman exhibits normal muscle tone. Larry Huffman displays no seizure activity. GCS eye subscore is 4. GCS verbal subscore is 5. GCS motor subscore is 6.  Poor short term memory  Skin: Skin is warm.  Psychiatric: Larry Huffman has a normal mood and affect.     Assessment/Plan Fall down steps Scalp hematoma R rib FX 4,5 with no PTX - pulmonary toilet, multimodal pain control Large R flank and buttock hematoma with some muscular extravasation Place abdominal binder. This should stop on its own. Hold ASA and Plavix. CBC q8h COVID negative  Admit to 4NP I tried to  call his daughter X 2 with no answer - left message. EDP did speak with her.  Zenovia Jarred, MD 12/12/2018, 8:55 PM

## 2018-12-13 LAB — BASIC METABOLIC PANEL
Anion gap: 10 (ref 5–15)
BUN: 37 mg/dL — ABNORMAL HIGH (ref 8–23)
CO2: 21 mmol/L — ABNORMAL LOW (ref 22–32)
Calcium: 8.1 mg/dL — ABNORMAL LOW (ref 8.9–10.3)
Chloride: 109 mmol/L (ref 98–111)
Creatinine, Ser: 2.08 mg/dL — ABNORMAL HIGH (ref 0.61–1.24)
GFR calc Af Amer: 31 mL/min — ABNORMAL LOW (ref >60)
GFR calc non Af Amer: 27 mL/min — ABNORMAL LOW (ref >60)
Glucose, Bld: 116 mg/dL — ABNORMAL HIGH (ref 70–99)
Potassium: 5.4 mmol/L — ABNORMAL HIGH (ref 3.5–5.1)
Sodium: 140 mmol/L (ref 135–145)

## 2018-12-13 LAB — CBC
HCT: 23 % — ABNORMAL LOW (ref 39.0–52.0)
HCT: 23.5 % — ABNORMAL LOW (ref 39.0–52.0)
HCT: 23 % — ABNORMAL LOW (ref 39.0–52.0)
Hemoglobin: 7.5 g/dL — ABNORMAL LOW (ref 13.0–17.0)
Hemoglobin: 7.3 g/dL — ABNORMAL LOW (ref 13.0–17.0)
Hemoglobin: 7.6 g/dL — ABNORMAL LOW (ref 13.0–17.0)
MCH: 32.1 pg (ref 26.0–34.0)
MCH: 31.3 pg (ref 26.0–34.0)
MCH: 31.8 pg (ref 26.0–34.0)
MCHC: 32.6 g/dL (ref 30.0–36.0)
MCHC: 31.1 g/dL (ref 30.0–36.0)
MCHC: 33 g/dL (ref 30.0–36.0)
MCV: 98.3 fL (ref 80.0–100.0)
MCV: 100.9 fL — ABNORMAL HIGH (ref 80.0–100.0)
MCV: 96.2 fL (ref 80.0–100.0)
Platelets: 161 10*3/uL (ref 150–400)
Platelets: 155 10*3/uL (ref 150–400)
Platelets: 102 10*3/uL — ABNORMAL LOW (ref 150–400)
RBC: 2.34 MIL/uL — ABNORMAL LOW (ref 4.22–5.81)
RBC: 2.33 MIL/uL — ABNORMAL LOW (ref 4.22–5.81)
RBC: 2.39 MIL/uL — ABNORMAL LOW (ref 4.22–5.81)
RDW: 14.5 % (ref 11.5–15.5)
RDW: 14.5 % (ref 11.5–15.5)
RDW: 14.4 % (ref 11.5–15.5)
WBC: 14 10*3/uL — ABNORMAL HIGH (ref 4.0–10.5)
WBC: 10.8 10*3/uL — ABNORMAL HIGH (ref 4.0–10.5)
WBC: 14.7 10*3/uL — ABNORMAL HIGH (ref 4.0–10.5)
nRBC: 0 % (ref 0.0–0.2)
nRBC: 0 % (ref 0.0–0.2)
nRBC: 0 % (ref 0.0–0.2)

## 2018-12-13 LAB — LACTIC ACID, PLASMA: Lactic Acid, Venous: 1.3 mmol/L (ref 0.5–1.9)

## 2018-12-13 LAB — MRSA PCR SCREENING: MRSA by PCR: POSITIVE — AB

## 2018-12-13 MED ORDER — PANTOPRAZOLE SODIUM 40 MG IV SOLR: 40.0000 mg | Freq: Every day | INTRAVENOUS | Status: DC

## 2018-12-13 MED ORDER — CHLORHEXIDINE GLUCONATE CLOTH 2 % EX PADS
6.0000 | MEDICATED_PAD | Freq: Every day | CUTANEOUS | Status: AC
  Administered 2018-12-14 – 2018-12-17 (×4): 6 via TOPICAL

## 2018-12-13 MED ORDER — SODIUM CHLORIDE 0.9% FLUSH
3.0000 mL | Freq: Two times a day (BID) | INTRAVENOUS | Status: DC
  Administered 2018-12-13 – 2018-12-17 (×7): 3 mL via INTRAVENOUS

## 2018-12-13 MED ORDER — ONDANSETRON 4 MG PO TBDP: 4.0000 mg | ORAL_TABLET | Freq: Four times a day (QID) | ORAL | Status: DC | PRN

## 2018-12-13 MED ORDER — ONDANSETRON HCL 4 MG/2ML IJ SOLN: 4.0000 mg | Freq: Four times a day (QID) | INTRAMUSCULAR | Status: DC | PRN

## 2018-12-13 MED ORDER — METHOCARBAMOL 500 MG PO TABS: 500.0000 mg | ORAL_TABLET | Freq: Three times a day (TID) | ORAL | Status: DC | PRN

## 2018-12-13 MED ORDER — MUPIROCIN 2 % EX OINT
1.0000 "application " | TOPICAL_OINTMENT | Freq: Two times a day (BID) | CUTANEOUS | Status: AC
  Administered 2018-12-13 – 2018-12-18 (×10): 1 via NASAL
  Filled 2018-12-13 (×4): qty 22

## 2018-12-13 MED ORDER — OXYCODONE HCL 5 MG PO TABS
5.0000 mg | ORAL_TABLET | ORAL | Status: DC | PRN
  Administered 2018-12-15 – 2018-12-17 (×4): 5 mg via ORAL
  Filled 2018-12-13 (×4): qty 1

## 2018-12-13 MED ORDER — SODIUM CHLORIDE 0.9% FLUSH: 3.0000 mL | INTRAVENOUS | Status: DC | PRN

## 2018-12-13 MED ORDER — AMLODIPINE BESYLATE 10 MG PO TABS
10.0000 mg | ORAL_TABLET | Freq: Every day | ORAL | Status: DC
  Administered 2018-12-13 – 2018-12-22 (×9): 10 mg via ORAL
  Filled 2018-12-13 (×3): qty 1
  Filled 2018-12-13: qty 2
  Filled 2018-12-13 (×6): qty 1

## 2018-12-13 MED ORDER — SODIUM CHLORIDE 0.9 % IV SOLN: 250.0000 mL | INTRAVENOUS | Status: DC | PRN

## 2018-12-13 MED ORDER — TRAMADOL HCL 50 MG PO TABS
50.0000 mg | ORAL_TABLET | Freq: Four times a day (QID) | ORAL | Status: DC | PRN
  Administered 2018-12-18 – 2018-12-20 (×2): 50 mg via ORAL
  Filled 2018-12-13 (×3): qty 1

## 2018-12-13 MED ORDER — OXYCODONE HCL 5 MG PO TABS
10.0000 mg | ORAL_TABLET | ORAL | Status: DC | PRN
  Administered 2018-12-14 – 2018-12-15 (×2): 10 mg via ORAL
  Filled 2018-12-13 (×2): qty 2

## 2018-12-13 MED ORDER — ACETAMINOPHEN 325 MG PO TABS: 650.0000 mg | ORAL_TABLET | Freq: Four times a day (QID) | ORAL | Status: DC | PRN

## 2018-12-13 MED ORDER — PANTOPRAZOLE SODIUM 40 MG PO TBEC
40.0000 mg | DELAYED_RELEASE_TABLET | Freq: Every day | ORAL | Status: DC
  Administered 2018-12-13 – 2018-12-22 (×10): 40 mg via ORAL
  Filled 2018-12-13 (×10): qty 1

## 2018-12-13 NOTE — ED Notes (Signed)
Placed abd binder on pt

## 2018-12-13 NOTE — ED Notes (Signed)
NT placed order for abd binder from materials management

## 2018-12-13 NOTE — Progress Notes (Signed)
Patient ID: Lonzo Candy, male   DOB: 1923-11-17, 85 y.o.   MRN: [id number]       Subjective: Patient sleeping.  No major complaints of any pain at this time.  Comfortable, but sore to try and move.  Objective: Vital signs in last 24 hours: Temp:  [98.3 F (36.8 C)] 98.3 F (36.8 C) (06/02 1742) Pulse Rate:  [51-71] 58 (06/03 0800) Resp:  [14-25] 16 (06/03 0800) BP: (112-164)/(44-77) 141/55 (06/03 0800) SpO2:  [92 %-100 %] 96 % (06/03 0800)    Intake/Output from previous day: No intake/output data recorded. Intake/Output this shift: No intake/output data recorded.  PE: Gen: NAD Heart: regular, but brady Lungs: CTAB, moves good air, sats are 96% on RA while sleeping.  Some right-sided chest wall tenderness Abd: pretty extensive ecchymosis of right flank and some skin tightness extending into his upper buttock.  This all is tender to touch Ext: MAE, NVI  Lab Results:  Recent Labs    12/12/18 1751 12/13/18 0153  WBC 23.8* 14.0*  HGB 9.3* 7.5*  HCT 29.9* 23.0*  PLT 191 161   BMET Recent Labs    12/12/18 1751  NA 136  K 4.4  CL 109  CO2 16*  GLUCOSE 154*  BUN 34*  CREATININE 1.79*  CALCIUM 8.0*   PT/INR No results for input(s): LABPROT, INR in the last 72 hours. CMP     Component Value Date/Time   NA 136 12/12/2018 1751   NA 134 (A) 01/27/2015   K 4.4 12/12/2018 1751   CL 109 12/12/2018 1751   CO2 16 (L) 12/12/2018 1751   GLUCOSE 154 (H) 12/12/2018 1751   BUN 34 (H) 12/12/2018 1751   BUN 16 01/27/2015   CREATININE 1.79 (H) 12/12/2018 1751   CALCIUM 8.0 (L) 12/12/2018 1751   PROT 6.3 (L) 12/12/2018 1751   ALBUMIN 3.0 (L) 12/12/2018 1751   AST 31 12/12/2018 1751   ALT 20 12/12/2018 1751   ALKPHOS 58 12/12/2018 1751   BILITOT 0.7 12/12/2018 1751   GFRNONAA 32 (L) 12/12/2018 1751   GFRAA 37 (L) 12/12/2018 1751   Lipase     Component Value Date/Time   LIPASE 18 04/06/2008 1426       Studies/Results: Dg Chest 2 View  Result Date:  12/12/2018 CLINICAL DATA:  Unwitnessed fall. Pain. EXAM: CHEST - 2 VIEW COMPARISON:  10/14/2017. FINDINGS: The heart is enlarged. Mild vascular congestion. No consolidation or edema. No definite rib fracture on this portable semi erect chest. Calcified tortuous aorta. Minimal blunting at both lung bases. BILATERAL shoulder surgery. Similar appearance to priors. IMPRESSION: Cardiomegaly with mild vascular congestion. No consolidation or edema. Electronically Signed   By: Staci Righter M.D.   On: 12/12/2018 18:10   Ct Head Wo Contrast  Result Date: 12/12/2018 CLINICAL DATA:  Unwitnessed fall down 8 steps. Dementia. EXAM: CT HEAD WITHOUT CONTRAST CT CERVICAL SPINE WITHOUT CONTRAST TECHNIQUE: Multidetector CT imaging of the head and cervical spine was performed following the standard protocol without intravenous contrast. Multiplanar CT image reconstructions of the cervical spine were also generated. COMPARISON:  06/20/2018. FINDINGS: CT HEAD FINDINGS Brain: Stable moderately enlarged ventricles and subarachnoid spaces. Stable moderate patchy white matter low density in both cerebral hemispheres. 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 No intracranial hemorrhage, mass lesion or CT evidence of acute infarction. Vascular: No hyperdense vessel or unexpected calcification. Skull: Normal. Negative for fracture or focal lesion. Sinuses/Orbits: Status post bilateral cataract extraction. Unremarkable paranasal sinuses. Other: Right posterior scalp hematoma  near the vertex. CT CERVICAL SPINE FINDINGS Alignment: Minimal anterolisthesis at the C2-3, C3-4, C4-5 levels. Mild retrolisthesis at the C5-6 level and mild anterolisthesis at the C6-7 level. Skull base and vertebrae: No acute fracture. No primary bone lesion or focal pathologic process. Soft tissues and spinal canal: No prevertebral fluid or swelling. No visible canal hematoma. Disc levels: Multilevel degenerative changes, most pronounced at the C5-6 level. Upper chest:  Mild biapical pleural and parenchymal scarring some pleural calcification. Other: Bilateral carotid artery calcifications. IMPRESSION: 1. Right posterior scalp hematoma without skull fracture or intracranial hemorrhage. 2. 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 3. No cervical spine fracture or traumatic subluxation. 4. Multilevel cervical spine degenerative changes. 5. Bilateral carotid artery atheromatous calcifications. Electronically Signed   By: Claudie Revering M.D.   On: 12/12/2018 20:07   Ct Chest W Contrast  Result Date: 12/12/2018 CLINICAL DATA:  [address] [address] downstairs.  Right-sided abdominal pain. EXAM: CT CHEST, ABDOMEN, AND PELVIS WITH CONTRAST TECHNIQUE: Multidetector CT imaging of the chest, abdomen and pelvis was performed following the standard protocol during bolus administration of intravenous contrast. CONTRAST:  132mL OMNIPAQUE IOHEXOL 300 MG/ML  SOLN COMPARISON:  None. FINDINGS: CT CHEST FINDINGS Cardiovascular: The heart is upper limits of normal in size for age. No pericardial effusion. There is tortuosity, ectasia and calcification of the thoracic aorta but no focal aneurysm or dissection. The branch vessels are patent. Moderate tortuosity and atherosclerotic calcifications. Three-vessel coronary artery calcifications. Mediastinum/Nodes: No mediastinal or hilar mass or adenopathy or hematoma. Small scattered lymph nodes are noted. The esophagus is grossly normal. Lungs/Pleura: Small bilateral pleural effusions with overlying atelectasis. No pulmonary contusion, pneumonia or pneumothorax. No worrisome pulmonary lesions. Musculoskeletal: The chest wall is unremarkable. No chest wall mass or hematoma. No supraclavicular or axillary adenopathy. There are mildly displaced right fourth and fifth lateral rib fractures. The sternum is intact. There is a T8 compression deformity but this is stable when compared to prior chest CT from 2015. No  acute thoracic vertebral body fractures. CT ABDOMEN PELVIS FINDINGS Hepatobiliary: No acute hepatic injury. No perihepatic fluid collection. No worrisome hepatic lesions. Small gallstone noted the gallbladder. No common bile duct dilatation. Pancreas: Advanced pancreatic atrophy but no mass or acute inflammation. No acute injury. There is marked dilatation of the main pancreatic duct in the body region the pancreas and there may be an obstructing calculus. This appears stable since 2015. Spleen: No acute splenic injury.  No perisplenic fluid collection. Adrenals/Urinary Tract: In the adrenal glands are unremarkable. No acute renal injury is identified. Both kidneys demonstrate mild age-related renal cortical thinning. There are cysts bilaterally but no worrisome lesions. The bladder is unremarkable. Stomach/Bowel: The stomach, duodenum, small bowel and colon are grossly normal without oral contrast. No acute inflammatory changes, mass lesions or obstructive findings. Vascular/Lymphatic: Advanced atherosclerotic calcifications involving the aorta and branch vessels but no aneurysm or dissection. No mesenteric or retroperitoneal mass or adenopathy. Reproductive: The prostate gland and seminal vesicles are surgically absent. A penile prosthesis is noted with a reservoir near the right side of the bladder. Other: No free abdominal/pelvic fluid collections, hematoma or free air. Musculoskeletal: There is a moderate-sized hematoma in the right flank area with suspected active extravasation of contrast. Is also a large hematoma involving the right upper gluteal muscles with active extravasation of contrast in the upper gluteus medius muscle. The bony pelvis is intact. No pelvic or hip fractures are identified. There is a smooth  depression involving the inferior endplate of L3 which is likely a Schmorl's node or old compression fracture. No definite acute compression fractures. Advanced facet disease noted. IMPRESSION: 1.  Mildly displaced right fourth and fifth lateral rib fractures with associated small pleural hematoma/pleural fluid. No pneumothorax is identified. 2. Small bilateral pleural effusions and overlying atelectasis but no pulmonary contusion or infiltrate. 3. The heart and great vessels are unremarkable except for advanced vascular calcifications. No acute aortic or branch vessel injury is identified. 4. Moderate-sized subcutaneous hematoma involving the right flank area and also moderate-sized hematoma involving the right upper gluteal muscles. Both these areas demonstrate active extravasation of contrast material. 5. No definite spine, pelvis or hip fractures. 6. No CT findings suspicious for solid organ injury in the abdomen or pelvis. Electronically Signed   By: Marijo Sanes M.D.   On: 12/12/2018 20:15   Ct Cervical Spine Wo Contrast  Result Date: 12/12/2018 CLINICAL DATA:  Unwitnessed fall down 8 steps. Dementia. EXAM: CT HEAD WITHOUT CONTRAST CT CERVICAL SPINE WITHOUT CONTRAST TECHNIQUE: Multidetector CT imaging of the head and cervical spine was performed following the standard protocol without intravenous contrast. Multiplanar CT image reconstructions of the cervical spine were also generated. COMPARISON:  06/20/2018. FINDINGS: CT HEAD FINDINGS Brain: Stable moderately enlarged ventricles and subarachnoid spaces. Stable moderate patchy white matter low density in both cerebral hemispheres. 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 No intracranial hemorrhage, mass lesion or CT evidence of acute infarction. Vascular: No hyperdense vessel or unexpected calcification. Skull: Normal. Negative for fracture or focal lesion. Sinuses/Orbits: Status post bilateral cataract extraction. Unremarkable paranasal sinuses. Other: Right posterior scalp hematoma near the vertex. CT CERVICAL SPINE FINDINGS Alignment: Minimal anterolisthesis at the C2-3, C3-4, C4-5 levels. Mild retrolisthesis at the C5-6 level and mild  anterolisthesis at the C6-7 level. Skull base and vertebrae: No acute fracture. No primary bone lesion or focal pathologic process. Soft tissues and spinal canal: No prevertebral fluid or swelling. No visible canal hematoma. Disc levels: Multilevel degenerative changes, most pronounced at the C5-6 level. Upper chest: Mild biapical pleural and parenchymal scarring some pleural calcification. Other: Bilateral carotid artery calcifications. IMPRESSION: 1. Right posterior scalp hematoma without skull fracture or intracranial hemorrhage. 2. 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 3. No cervical spine fracture or traumatic subluxation. 4. Multilevel cervical spine degenerative changes. 5. Bilateral carotid artery atheromatous calcifications. Electronically Signed   By: Claudie Revering M.D.   On: 12/12/2018 20:07   Ct Lumbar Spine Wo Contrast  Result Date: 12/12/2018 CLINICAL DATA:  [place].  Back pain. EXAM: CT LUMBAR SPINE WITHOUT CONTRAST TECHNIQUE: Multidetector CT imaging of the lumbar spine was performed without intravenous contrast administration. Multiplanar CT image reconstructions were also generated. COMPARISON:  Lumbar spine radiographs 10/14/2017 FINDINGS: Segmentation: There are five lumbar type vertebral bodies. The last full intervertebral disc space is labeled L5-S1. Alignment: Degenerative lumbar spondylosis with degenerative retrolisthesis of L4 which appears stable since prior radiographs. Vertebrae: Remote/stable inferior endplate depression of L3 which could be a Schmorl's node or old compression fracture. Moderate osteoporosis but no definite acute lumbar fractures. Advanced facet disease is noted in the spine and is evidence of prior lumbar surgery with right-sided laminectomy at L3-4 and wide laminectomy at L4-5. The visualized bony pelvis is intact. Paraspinal and other soft tissues: No significant findings. Advanced atherosclerotic  calcifications are noted involving the aorta and branch vessels. Renal cysts are also noted. Disc levels: No large disc protrusions are  identified. No significant spinal or foraminal stenosis. IMPRESSION: 1. No acute lumbar fractures. 2. Remote inferior endplate compression deformity of L3. 3. Degenerative lumbar spondylosis with multilevel disc disease and facet disease and postoperative changes at L3-4 and L4-5. Electronically Signed   By: Marijo Sanes M.D.   On: 12/12/2018 20:21   Ct Abdomen Pelvis W Contrast  Result Date: 12/12/2018 CLINICAL DATA:  [place] [address] downstairs.  Right-sided abdominal pain. EXAM: CT CHEST, ABDOMEN, AND PELVIS WITH CONTRAST TECHNIQUE: Multidetector CT imaging of the chest, abdomen and pelvis was performed following the standard protocol during bolus administration of intravenous contrast. CONTRAST:  144mL OMNIPAQUE IOHEXOL 300 MG/ML  SOLN COMPARISON:  None. FINDINGS: CT CHEST FINDINGS Cardiovascular: The heart is upper limits of normal in size for age. No pericardial effusion. There is tortuosity, ectasia and calcification of the thoracic aorta but no focal aneurysm or dissection. The branch vessels are patent. Moderate tortuosity and atherosclerotic calcifications. Three-vessel coronary artery calcifications. Mediastinum/Nodes: No mediastinal or hilar mass or adenopathy or hematoma. Small scattered lymph nodes are noted. The esophagus is grossly normal. Lungs/Pleura: Small bilateral pleural effusions with overlying atelectasis. No pulmonary contusion, pneumonia or pneumothorax. No worrisome pulmonary lesions. Musculoskeletal: The chest wall is unremarkable. No chest wall mass or hematoma. No supraclavicular or axillary adenopathy. There are mildly displaced right fourth and fifth lateral rib fractures. The sternum is intact. There is a T8 compression deformity but this is stable when compared to prior chest CT from 2015. No acute thoracic vertebral body fractures. CT ABDOMEN PELVIS  FINDINGS Hepatobiliary: No acute hepatic injury. No perihepatic fluid collection. No worrisome hepatic lesions. Small gallstone noted the gallbladder. No common bile duct dilatation. Pancreas: Advanced pancreatic atrophy but no mass or acute inflammation. No acute injury. There is marked dilatation of the main pancreatic duct in the body region the pancreas and there may be an obstructing calculus. This appears stable since 2015. Spleen: No acute splenic injury.  No perisplenic fluid collection. Adrenals/Urinary Tract: In the adrenal glands are unremarkable. No acute renal injury is identified. Both kidneys demonstrate mild age-related renal cortical thinning. There are cysts bilaterally but no worrisome lesions. The bladder is unremarkable. Stomach/Bowel: The stomach, duodenum, small bowel and colon are grossly normal without oral contrast. No acute inflammatory changes, mass lesions or obstructive findings. Vascular/Lymphatic: Advanced atherosclerotic calcifications involving the aorta and branch vessels but no aneurysm or dissection. No mesenteric or retroperitoneal mass or adenopathy. Reproductive: The prostate gland and seminal vesicles are surgically absent. A penile prosthesis is noted with a reservoir near the right side of the bladder. Other: No free abdominal/pelvic fluid collections, hematoma or free air. Musculoskeletal: There is a moderate-sized hematoma in the right flank area with suspected active extravasation of contrast. Is also a large hematoma involving the right upper gluteal muscles with active extravasation of contrast in the upper gluteus medius muscle. The bony pelvis is intact. No pelvic or hip fractures are identified. There is a smooth depression involving the inferior endplate of L3 which is likely a Schmorl's node or old compression fracture. No definite acute compression fractures. Advanced facet disease noted. IMPRESSION: 1. Mildly displaced right fourth and fifth lateral rib  fractures with associated small pleural hematoma/pleural fluid. No pneumothorax is identified. 2. Small bilateral pleural effusions and overlying atelectasis but no pulmonary contusion or infiltrate. 3. The heart and great vessels are unremarkable except for advanced vascular calcifications. No acute aortic or branch vessel injury is identified. 4. Moderate-sized subcutaneous hematoma involving the right flank area and  also moderate-sized hematoma involving the right upper gluteal muscles. Both these areas demonstrate active extravasation of contrast material. 5. No definite spine, pelvis or hip fractures. 6. No CT findings suspicious for solid organ injury in the abdomen or pelvis. Electronically Signed   By: Marijo Sanes M.D.   On: 12/12/2018 20:15    Anti-infectives: Anti-infectives (From admission, onward)   None       Assessment/Plan Fall down steps Scalp hematoma R rib FX 4,5 with no PTX - pulmonary toilet, multimodal pain control, PT/OT Large R flank and buttock hematoma with some muscular extravasation Place abdominal binder. This should stop on its own. Hold ASA and Plavix. CBC q8h ABL anemia - hgb down to 7.5 this am.  Repeat CBC soon.  May need some blood FEN - regular diet VTE - on hold, SCDs ID - none   LOS: 1 day    Henreitta Cea , [facility] 12/13/2018, 8:20 AM Pager: [phone]

## 2018-12-13 NOTE — ED Notes (Signed)
This RN spoke with pt's son and updated in him pt's care.

## 2018-12-13 NOTE — ED Notes (Signed)
Pt assisted with urinal

## 2018-12-13 NOTE — ED Notes (Signed)
ED TO INPATIENT HANDOFF REPORT  ED Nurse Name and Phone #:  Donella Stade [phone]  S Name/Age/Gender Larry Huffman 85 y.o. male Room/Bed: 039C/039C  Code Status   Code Status: Full Code  Home/SNF/Other Home Patient oriented to: self, place and situation Is this baseline? Yes   Triage Complete: Triage complete  Chief Complaint fall  Triage Note Per [facility], pt from home after unwtinessed fall down 8 steps. Pt has obvious injury to right lower abd/right flank where hard area is forming a possible hematoma. Pt has pain to same area. Pt has dementia, axox2. VSS.    Allergies Allergies  Allergen Reactions  . Ciprofloxacin Rash  . Tape Other (See Comments)    Pulls skin off.  Please use "paper tape" only.   . Dapsone Other (See Comments)    Lowers WBC count?? "made me very sick"    Level of Care/Admitting Diagnosis ED Disposition    ED Disposition Condition [place]: [place] [[id number]]  Level of Care: Progressive [102]  Covid Evaluation: Screening Protocol (No Symptoms)  Diagnosis: Right flank hematoma [[id number]]  Admitting Physician: Georganna Skeans [[id number]]  Attending Physician: TRAUMA MD [[id number]]  Estimated length of stay: 3 - 4 days  Certification:: I certify this patient will need inpatient services for at least 2 midnights  Bed request comments: 4NP  PT Class (Do Not Modify): Inpatient [101]  PT Acc Code (Do Not Modify): Private [1]       B Medical/Surgery History Past Medical History:  Diagnosis Date  . Acid reflux   . Arthritis   . CAD (coronary artery disease)   . Cancer (HCC) hx of skin cancer  . Constipation   . Coronary artery disease   . High cholesterol   . HOH (hard of hearing)    wears hearing aids, but still can not hear  . Hypertension   . Incontinence of urine   . Peripheral vascular disease ([place])    Remote left carotid endarterectomy  . Sleep apnea    CPAP   Past Surgical History:  Procedure Laterality  Date  . 2-D echocardiogram  03/07/2008   Ejection fraction greater than 55%. Mild concentric left ventricular hypertrophy.LV relaxation. mildly dilated left atrium. Mild mitral regurgitation.   . ANAL FISSURE REPAIR    . CARDIAC CATHETERIZATION     X 2 stents  . COLON SURGERY     removed part of the small intestine  . COLONOSCOPY W/ POLYPECTOMY    . CORONARY STENT PLACEMENT    . ESOPHAGOGASTRODUODENOSCOPY N/A 01/16/2015   Procedure: ESOPHAGOGASTRODUODENOSCOPY (EGD);  Surgeon: Irene Shipper, MD;  Location: [facility] ENDOSCOPY;  Service: Endoscopy;  Laterality: N/A;  . EYE SURGERY     cataracts  . I&D EXTREMITY  06/28/2012   Procedure: IRRIGATION AND DEBRIDEMENT EXTREMITY;  Surgeon: Newt Minion, MD;  Location: [place];  Service: Orthopedics;  Laterality: Right;  Debridement Wound Right Leg, VAC, Antibiotic Beads, Apply A-Cell  . IRRIGATION AND DEBRIDEMENT ABSCESS    . LAPAROTOMY    . LUMBAR LAMINECTOMY N/A 02/08/2014   Procedure: L3-4, L4-5 Decompression;  Surgeon: Marybelle Killings, MD;  Location: [place];  Service: Orthopedics;  Laterality: N/A;  L3-4, L4-5 Decompression  . NASAL SINUS SURGERY    . PROSTATE SURGERY    . ROTATOR CUFF REPAIR       A IV Location/Drains/Wounds Patient Lines/Drains/Airways Status   Active Line/Drains/Airways    Name:   Placement date:  Placement time:   Site:   Days:   Peripheral IV 12/12/18 Antecubital   12/12/18    1900    Antecubital   1   Urethral Catheter Brittney Beam, RN  Straight-tip 14 Fr.   01/19/15    1200    Straight-tip   1424   Incision 06/28/12 Leg Right   06/28/12    1046     2359   Incision (Closed) 02/08/14 Back Other (Comment)   02/08/14    0847     1769   Wound 07/29/12 Leg Right   07/29/12    0100    Leg   2328   Wound / Incision (Open or Dehisced) 01/16/15 Other (Comment) Elbow Right   01/16/15    1000    Elbow   1427          Intake/Output Last 24 hours No intake or output data in the 24 hours ending 12/13/18 1142  Labs/Imaging Results  for orders placed or performed during the hospital encounter of 12/12/18 (from the past 48 hour(s))  Lactic acid, plasma     Status: None   Collection Time: 12/12/18  5:51 PM  Result Value Ref Range   Lactic Acid, Venous 1.7 0.5 - 1.9 mmol/L    Comment: Performed at [facility], [place] [address] [place], [place] [address]  Comprehensive metabolic panel     Status: Abnormal   Collection Time: 12/12/18  5:51 PM  Result Value Ref Range   Sodium 136 135 - 145 mmol/L   Potassium 4.4 3.5 - 5.1 mmol/L   Chloride 109 98 - 111 mmol/L   CO2 16 (L) 22 - 32 mmol/L   Glucose, Bld 154 (H) 70 - 99 mg/dL   BUN 34 (H) 8 - 23 mg/dL   Creatinine, Ser 1.79 (H) 0.61 - 1.24 mg/dL   Calcium 8.0 (L) 8.9 - 10.3 mg/dL   Total Protein 6.3 (L) 6.5 - 8.1 g/dL   Albumin 3.0 (L) 3.5 - 5.0 g/dL   AST 31 15 - 41 U/L   ALT 20 0 - 44 U/L   Alkaline Phosphatase 58 38 - 126 U/L   Total Bilirubin 0.7 0.3 - 1.2 mg/dL   GFR calc non Af Amer 32 (L) >60 mL/min   GFR calc Af Amer 37 (L) >60 mL/min   Anion gap 11 5 - 15    Comment: Performed at [facility], [place] [address] [place], [place] [address]  CBC with Differential     Status: Abnormal   Collection Time: 12/12/18  5:51 PM  Result Value Ref Range   WBC 23.8 (H) 4.0 - 10.5 K/uL   RBC 2.93 (L) 4.22 - 5.81 MIL/uL   Hemoglobin 9.3 (L) 13.0 - 17.0 g/dL   HCT 29.9 (L) 39.0 - 52.0 %   MCV 102.0 (H) 80.0 - 100.0 fL   MCH 31.7 26.0 - 34.0 pg   MCHC 31.1 30.0 - 36.0 g/dL   RDW 14.5 11.5 - 15.5 %   Platelets 191 150 - 400 K/uL   nRBC 0.0 0.0 - 0.2 %   Neutrophils Relative % 77 %   Neutro Abs 18.6 (H) 1.7 - 7.7 K/uL   Lymphocytes Relative 9 %   Lymphs Abs 2.1 0.7 - 4.0 K/uL   Monocytes Relative 11 %   Monocytes Absolute 2.5 (H) 0.1 - 1.0 K/uL   Eosinophils Relative 1 %   Eosinophils Absolute 0.2 0.0 - 0.5 K/uL   Basophils  Relative 0 %   Basophils Absolute 0.1 0.0 - 0.1 K/uL   Immature Granulocytes 2 %   Abs Immature Granulocytes 0.35 (H) 0.00 - 0.07  K/uL    Comment: Performed at [facility], [place] [address], [place], [place] [address]  Type and screen [place]     Status: None   Collection Time: 12/12/18  5:51 PM  Result Value Ref Range   ABO/RH(D) O POS    Antibody Screen NEG    Sample Expiration      12/15/2018,2359 Performed at [facility], [place] [address] [place], [place] [address]   SARS Coronavirus 2 (CEPHEID - Performed in [facility] hospital lab), Hosp Order     Status: None   Collection Time: 12/12/18  7:01 PM  Result Value Ref Range   SARS Coronavirus 2 NEGATIVE NEGATIVE    Comment: (NOTE) If result is NEGATIVE SARS-CoV-2 target nucleic acids are NOT DETECTED. The SARS-CoV-2 RNA is generally detectable in upper and lower  respiratory specimens during the acute phase of infection. The lowest  concentration of SARS-CoV-2 viral copies this assay can detect is 250  copies / mL. A negative result does not preclude SARS-CoV-2 infection  and should not be used as the sole basis for treatment or other  patient management decisions.  A negative result may occur with  improper specimen collection / handling, submission of specimen other  than nasopharyngeal swab, presence of viral mutation(s) within the  areas targeted by this assay, and inadequate number of viral copies  (<250 copies / mL). A negative result must be combined with clinical  observations, patient history, and epidemiological information. If result is POSITIVE SARS-CoV-2 target nucleic acids are DETECTED. The SARS-CoV-2 RNA is generally detectable in upper and lower  respiratory specimens dur ing the acute phase of infection.  Positive  results are indicative of active infection with SARS-CoV-2.  Clinical  correlation with patient history and other diagnostic information is  necessary to determine patient infection status.  Positive results do  not rule out bacterial infection or co-infection with other viruses. If result is  PRESUMPTIVE POSTIVE SARS-CoV-2 nucleic acids MAY BE PRESENT.   A presumptive positive result was obtained on the submitted specimen  and confirmed on repeat testing.  While 2019 novel coronavirus  (SARS-CoV-2) nucleic acids may be present in the submitted sample  additional confirmatory testing may be necessary for epidemiological  and / or clinical management purposes  to differentiate between  SARS-CoV-2 and other Sarbecovirus currently known to infect humans.  If clinically indicated additional testing with an alternate test  methodology [id number]) is advised. The SARS-CoV-2 RNA is generally  detectable in upper and lower respiratory sp ecimens during the acute  phase of infection. The expected result is Negative. Fact Sheet for Patients:  StrictlyIdeas.no Fact Sheet for Healthcare Providers: BankingDealers.co.za This test is not yet approved or cleared by the [place] FDA and has been authorized for detection and/or diagnosis of SARS-CoV-2 by FDA under an Emergency Use Authorization (EUA).  This EUA will remain in effect (meaning this test can be used) for the duration of the COVID-19 declaration under Section 564(b)(1) of the Act, 21 U.S.C. section 360bbb-3(b)(1), unless the authorization is terminated or revoked sooner. Performed at [facility], [place] [address] [place], [place] [address]   CBC     Status: Abnormal   Collection Time: 12/13/18  1:53 AM  Result Value Ref Range   WBC 14.0 (  H) 4.0 - 10.5 K/uL   RBC 2.34 (L) 4.22 - 5.81 MIL/uL   Hemoglobin 7.5 (L) 13.0 - 17.0 g/dL   HCT 23.0 (L) 39.0 - 52.0 %   MCV 98.3 80.0 - 100.0 fL   MCH 32.1 26.0 - 34.0 pg   MCHC 32.6 30.0 - 36.0 g/dL   RDW 14.5 11.5 - 15.5 %   Platelets 161 150 - 400 K/uL   nRBC 0.0 0.0 - 0.2 %    Comment: Performed at [facility], [place] [address] [place], [place] [address]  Lactic acid, plasma     Status: None   Collection Time: 12/13/18   8:30 AM  Result Value Ref Range   Lactic Acid, Venous 1.3 0.5 - 1.9 mmol/L    Comment: Performed at [place] [address] [place], [place] [address]  CBC     Status: Abnormal   Collection Time: 12/13/18  8:30 AM  Result Value Ref Range   WBC 10.8 (H) 4.0 - 10.5 K/uL   RBC 2.33 (L) 4.22 - 5.81 MIL/uL   Hemoglobin 7.3 (L) 13.0 - 17.0 g/dL   HCT 23.5 (L) 39.0 - 52.0 %   MCV 100.9 (H) 80.0 - 100.0 fL   MCH 31.3 26.0 - 34.0 pg   MCHC 31.1 30.0 - 36.0 g/dL   RDW 14.5 11.5 - 15.5 %   Platelets 155 150 - 400 K/uL   nRBC 0.0 0.0 - 0.2 %    Comment: Performed at [facility], [place] [address] [place], [place] [address]  Basic metabolic panel     Status: Abnormal   Collection Time: 12/13/18  8:30 AM  Result Value Ref Range   Sodium 140 135 - 145 mmol/L   Potassium 5.4 (H) 3.5 - 5.1 mmol/L   Chloride 109 98 - 111 mmol/L   CO2 21 (L) 22 - 32 mmol/L   Glucose, Bld 116 (H) 70 - 99 mg/dL   BUN 37 (H) 8 - 23 mg/dL   Creatinine, Ser 2.08 (H) 0.61 - 1.24 mg/dL   Calcium 8.1 (L) 8.9 - 10.3 mg/dL   GFR calc non Af Amer 27 (L) >60 mL/min   GFR calc Af Amer 31 (L) >60 mL/min   Anion gap 10 5 - 15    Comment: Performed at [facility] [address], [place], [place] [address]   Dg Chest 2 View  Result Date: 12/12/2018 CLINICAL DATA:  Unwitnessed fall. Pain. EXAM: CHEST - 2 VIEW COMPARISON:  10/14/2017. FINDINGS: The heart is enlarged. Mild vascular congestion. No consolidation or edema. No definite rib fracture on this portable semi erect chest. Calcified tortuous aorta. Minimal blunting at both lung bases. BILATERAL shoulder surgery. Similar appearance to priors. IMPRESSION: Cardiomegaly with mild vascular congestion. No consolidation or edema. Electronically Signed   By: Staci Righter M.D.   On: 12/12/2018 18:10   Ct Head Wo Contrast  Result Date: 12/12/2018 CLINICAL DATA:  Unwitnessed fall down 8 steps. Dementia. EXAM: CT HEAD WITHOUT CONTRAST CT CERVICAL SPINE WITHOUT  CONTRAST TECHNIQUE: Multidetector CT imaging of the head and cervical spine was performed following the standard protocol without intravenous contrast. Multiplanar CT image reconstructions of the cervical spine were also generated. COMPARISON:  06/20/2018. FINDINGS: CT HEAD FINDINGS Brain: Stable moderately enlarged ventricles and subarachnoid spaces. Stable moderate patchy white matter low density in both cerebral hemispheres. 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 No intracranial hemorrhage, mass lesion or CT evidence of acute  infarction. Vascular: No hyperdense vessel or unexpected calcification. Skull: Normal. Negative for fracture or focal lesion. Sinuses/Orbits: Status post bilateral cataract extraction. Unremarkable paranasal sinuses. Other: Right posterior scalp hematoma near the vertex. CT CERVICAL SPINE FINDINGS Alignment: Minimal anterolisthesis at the C2-3, C3-4, C4-5 levels. Mild retrolisthesis at the C5-6 level and mild anterolisthesis at the C6-7 level. Skull base and vertebrae: No acute fracture. No primary bone lesion or focal pathologic process. Soft tissues and spinal canal: No prevertebral fluid or swelling. No visible canal hematoma. Disc levels: Multilevel degenerative changes, most pronounced at the C5-6 level. Upper chest: Mild biapical pleural and parenchymal scarring some pleural calcification. Other: Bilateral carotid artery calcifications. IMPRESSION: 1. Right posterior scalp hematoma without skull fracture or intracranial hemorrhage. 2. 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 3. No cervical spine fracture or traumatic subluxation. 4. Multilevel cervical spine degenerative changes. 5. Bilateral carotid artery atheromatous calcifications. Electronically Signed   By: Claudie Revering M.D.   On: 12/12/2018 20:07   Ct Chest W Contrast  Result Date: 12/12/2018 CLINICAL DATA:  [place] downstairs.  Right-sided abdominal pain. EXAM:  CT CHEST, ABDOMEN, AND PELVIS WITH CONTRAST TECHNIQUE: Multidetector CT imaging of the chest, abdomen and pelvis was performed following the standard protocol during bolus administration of intravenous contrast. CONTRAST:  158mL OMNIPAQUE IOHEXOL 300 MG/ML  SOLN COMPARISON:  None. FINDINGS: CT CHEST FINDINGS Cardiovascular: The heart is upper limits of normal in size for age. No pericardial effusion. There is tortuosity, ectasia and calcification of the thoracic aorta but no focal aneurysm or dissection. The branch vessels are patent. Moderate tortuosity and atherosclerotic calcifications. Three-vessel coronary artery calcifications. Mediastinum/Nodes: No mediastinal or hilar mass or adenopathy or hematoma. Small scattered lymph nodes are noted. The esophagus is grossly normal. Lungs/Pleura: Small bilateral pleural effusions with overlying atelectasis. No pulmonary contusion, pneumonia or pneumothorax. No worrisome pulmonary lesions. Musculoskeletal: The chest wall is unremarkable. No chest wall mass or hematoma. No supraclavicular or axillary adenopathy. There are mildly displaced right fourth and fifth lateral rib fractures. The sternum is intact. There is a T8 compression deformity but this is stable when compared to prior chest CT from 2015. No acute thoracic vertebral body fractures. CT ABDOMEN PELVIS FINDINGS Hepatobiliary: No acute hepatic injury. No perihepatic fluid collection. No worrisome hepatic lesions. Small gallstone noted the gallbladder. No common bile duct dilatation. Pancreas: Advanced pancreatic atrophy but no mass or acute inflammation. No acute injury. There is marked dilatation of the main pancreatic duct in the body region the pancreas and there may be an obstructing calculus. This appears stable since 2015. Spleen: No acute splenic injury.  No perisplenic fluid collection. Adrenals/Urinary Tract: In the adrenal glands are unremarkable. No acute renal injury is identified. Both kidneys  demonstrate mild age-related renal cortical thinning. There are cysts bilaterally but no worrisome lesions. The bladder is unremarkable. Stomach/Bowel: The stomach, duodenum, small bowel and colon are grossly normal without oral contrast. No acute inflammatory changes, mass lesions or obstructive findings. Vascular/Lymphatic: Advanced atherosclerotic calcifications involving the aorta and branch vessels but no aneurysm or dissection. No mesenteric or retroperitoneal mass or adenopathy. Reproductive: The prostate gland and seminal vesicles are surgically absent. A penile prosthesis is noted with a reservoir near the right side of the bladder. Other: No free abdominal/pelvic fluid collections, hematoma or free air. Musculoskeletal: There is a moderate-sized hematoma in the right flank area with suspected active extravasation of contrast. Is also a large hematoma involving the right  upper gluteal muscles with active extravasation of contrast in the upper gluteus medius muscle. The bony pelvis is intact. No pelvic or hip fractures are identified. There is a smooth depression involving the inferior endplate of L3 which is likely a Schmorl's node or old compression fracture. No definite acute compression fractures. Advanced facet disease noted. IMPRESSION: 1. Mildly displaced right fourth and fifth lateral rib fractures with associated small pleural hematoma/pleural fluid. No pneumothorax is identified. 2. Small bilateral pleural effusions and overlying atelectasis but no pulmonary contusion or infiltrate. 3. The heart and great vessels are unremarkable except for advanced vascular calcifications. No acute aortic or branch vessel injury is identified. 4. Moderate-sized subcutaneous hematoma involving the right flank area and also moderate-sized hematoma involving the right upper gluteal muscles. Both these areas demonstrate active extravasation of contrast material. 5. No definite spine, pelvis or hip fractures. 6. No CT  findings suspicious for solid organ injury in the abdomen or pelvis. Electronically Signed   By: Marijo Sanes M.D.   On: 12/12/2018 20:15   Ct Cervical Spine Wo Contrast  Result Date: 12/12/2018 CLINICAL DATA:  Unwitnessed fall down 8 steps. Dementia. EXAM: CT HEAD WITHOUT CONTRAST CT CERVICAL SPINE WITHOUT CONTRAST TECHNIQUE: Multidetector CT imaging of the head and cervical spine was performed following the standard protocol without intravenous contrast. Multiplanar CT image reconstructions of the cervical spine were also generated. COMPARISON:  06/20/2018. FINDINGS: CT HEAD FINDINGS Brain: Stable moderately enlarged ventricles and subarachnoid spaces. Stable moderate patchy white matter low density in both cerebral hemispheres. 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 No intracranial hemorrhage, mass lesion or CT evidence of acute infarction. Vascular: No hyperdense vessel or unexpected calcification. Skull: Normal. Negative for fracture or focal lesion. Sinuses/Orbits: Status post bilateral cataract extraction. Unremarkable paranasal sinuses. Other: Right posterior scalp hematoma near the vertex. CT CERVICAL SPINE FINDINGS Alignment: Minimal anterolisthesis at the C2-3, C3-4, C4-5 levels. Mild retrolisthesis at the C5-6 level and mild anterolisthesis at the C6-7 level. Skull base and vertebrae: No acute fracture. No primary bone lesion or focal pathologic process. Soft tissues and spinal canal: No prevertebral fluid or swelling. No visible canal hematoma. Disc levels: Multilevel degenerative changes, most pronounced at the C5-6 level. Upper chest: Mild biapical pleural and parenchymal scarring some pleural calcification. Other: Bilateral carotid artery calcifications. IMPRESSION: 1. Right posterior scalp hematoma without skull fracture or intracranial hemorrhage. 2. 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 3. No cervical spine fracture or  traumatic subluxation. 4. Multilevel cervical spine degenerative changes. 5. Bilateral carotid artery atheromatous calcifications. Electronically Signed   By: Claudie Revering M.D.   On: 12/12/2018 20:07   Ct Lumbar Spine Wo Contrast  Result Date: 12/12/2018 CLINICAL DATA:  [place].  Back pain. EXAM: CT LUMBAR SPINE WITHOUT CONTRAST TECHNIQUE: Multidetector CT imaging of the lumbar spine was performed without intravenous contrast administration. Multiplanar CT image reconstructions were also generated. COMPARISON:  Lumbar spine radiographs 10/14/2017 FINDINGS: Segmentation: There are five lumbar type vertebral bodies. The last full intervertebral disc space is labeled L5-S1. Alignment: Degenerative lumbar spondylosis with degenerative retrolisthesis of L4 which appears stable since prior radiographs. Vertebrae: Remote/stable inferior endplate depression of L3 which could be a Schmorl's node or old compression fracture. Moderate osteoporosis but no definite acute lumbar fractures. Advanced facet disease is noted in the spine and is evidence of prior lumbar surgery with right-sided laminectomy at L3-4 and wide laminectomy at L4-5. The visualized bony pelvis is intact. Paraspinal and  other soft tissues: No significant findings. Advanced atherosclerotic calcifications are noted involving the aorta and branch vessels. Renal cysts are also noted. Disc levels: No large disc protrusions are identified. No significant spinal or foraminal stenosis. IMPRESSION: 1. No acute lumbar fractures. 2. Remote inferior endplate compression deformity of L3. 3. Degenerative lumbar spondylosis with multilevel disc disease and facet disease and postoperative changes at L3-4 and L4-5. Electronically Signed   By: Marijo Sanes M.D.   On: 12/12/2018 20:21   Ct Abdomen Pelvis W Contrast  Result Date: 12/12/2018 CLINICAL DATA:  [place] downstairs.  Right-sided abdominal pain. EXAM: CT CHEST, ABDOMEN, AND PELVIS WITH CONTRAST TECHNIQUE: Multidetector  CT imaging of the chest, abdomen and pelvis was performed following the standard protocol during bolus administration of intravenous contrast. CONTRAST:  122mL OMNIPAQUE IOHEXOL 300 MG/ML  SOLN COMPARISON:  None. FINDINGS: CT CHEST FINDINGS Cardiovascular: The heart is upper limits of normal in size for age. No pericardial effusion. There is tortuosity, ectasia and calcification of the thoracic aorta but no focal aneurysm or dissection. The branch vessels are patent. Moderate tortuosity and atherosclerotic calcifications. Three-vessel coronary artery calcifications. Mediastinum/Nodes: No mediastinal or hilar mass or adenopathy or hematoma. Small scattered lymph nodes are noted. The esophagus is grossly normal. Lungs/Pleura: Small bilateral pleural effusions with overlying atelectasis. No pulmonary contusion, pneumonia or pneumothorax. No worrisome pulmonary lesions. Musculoskeletal: The chest wall is unremarkable. No chest wall mass or hematoma. No supraclavicular or axillary adenopathy. There are mildly displaced right fourth and fifth lateral rib fractures. The sternum is intact. There is a T8 compression deformity but this is stable when compared to prior chest CT from 2015. No acute thoracic vertebral body fractures. CT ABDOMEN PELVIS FINDINGS Hepatobiliary: No acute hepatic injury. No perihepatic fluid collection. No worrisome hepatic lesions. Small gallstone noted the gallbladder. No common bile duct dilatation. Pancreas: Advanced pancreatic atrophy but no mass or acute inflammation. No acute injury. There is marked dilatation of the main pancreatic duct in the body region the pancreas and there may be an obstructing calculus. This appears stable since 2015. Spleen: No acute splenic injury.  No perisplenic fluid collection. Adrenals/Urinary Tract: In the adrenal glands are unremarkable. No acute renal injury is identified. Both kidneys demonstrate mild age-related renal cortical thinning. There are cysts  bilaterally but no worrisome lesions. The bladder is unremarkable. Stomach/Bowel: The stomach, duodenum, small bowel and colon are grossly normal without oral contrast. No acute inflammatory changes, mass lesions or obstructive findings. Vascular/Lymphatic: Advanced atherosclerotic calcifications involving the aorta and branch vessels but no aneurysm or dissection. No mesenteric or retroperitoneal mass or adenopathy. Reproductive: The prostate gland and seminal vesicles are surgically absent. A penile prosthesis is noted with a reservoir near the right side of the bladder. Other: No free abdominal/pelvic fluid collections, hematoma or free air. Musculoskeletal: There is a moderate-sized hematoma in the right flank area with suspected active extravasation of contrast. Is also a large hematoma involving the right upper gluteal muscles with active extravasation of contrast in the upper gluteus medius muscle. The bony pelvis is intact. No pelvic or hip fractures are identified. There is a smooth depression involving the inferior endplate of L3 which is likely a Schmorl's node or old compression fracture. No definite acute compression fractures. Advanced facet disease noted. IMPRESSION: 1. Mildly displaced right fourth and fifth lateral rib fractures with associated small pleural hematoma/pleural fluid. No pneumothorax is identified. 2. Small bilateral pleural effusions and overlying atelectasis but no pulmonary contusion or infiltrate. 3. The heart  and great vessels are unremarkable except for advanced vascular calcifications. No acute aortic or branch vessel injury is identified. 4. Moderate-sized subcutaneous hematoma involving the right flank area and also moderate-sized hematoma involving the right upper gluteal muscles. Both these areas demonstrate active extravasation of contrast material. 5. No definite spine, pelvis or hip fractures. 6. No CT findings suspicious for solid organ injury in the abdomen or pelvis.  Electronically Signed   By: Marijo Sanes M.D.   On: 12/12/2018 20:15    Pending Labs Unresulted Labs (From admission, onward)    Start     Ordered   12/14/18 0500  CBC  Tomorrow morning,   R     12/13/18 1101   12/14/18 7846  Basic metabolic panel  Tomorrow morning,   R     12/13/18 1101   12/13/18 0050  CBC  Now then every 8 hours,   R     12/13/18 0049          Vitals/Pain Today's Vitals   12/13/18 0730 12/13/18 0800 12/13/18 0830 12/13/18 0955  BP: (!) 119/47 (!) 141/55 (!) 118/51 (!) 135/42  Pulse: (!) 54 (!) 58 (!) 53 (!) 54  Resp: 18 16 14 16   Temp:      TempSrc:      SpO2: 92% 96% 93% 92%  PainSc:        Isolation Precautions No active isolations  Medications Medications  sodium chloride flush (NS) 0.9 % injection 3 mL (3 mLs Intravenous Not Given 12/13/18 0953)  sodium chloride flush (NS) 0.9 % injection 3 mL (has no administration in time range)  0.9 %  sodium chloride infusion (has no administration in time range)  oxyCODONE (Oxy IR/ROXICODONE) immediate release tablet 5 mg (has no administration in time range)  oxyCODONE (Oxy IR/ROXICODONE) immediate release tablet 10 mg (has no administration in time range)  morphine 4 MG/ML injection 4 mg (4 mg Intravenous Given 12/12/18 2148)  ondansetron (ZOFRAN-ODT) disintegrating tablet 4 mg (has no administration in time range)    Or  ondansetron (ZOFRAN) injection 4 mg (has no administration in time range)  pantoprazole (PROTONIX) EC tablet 40 mg (40 mg Oral Given 12/13/18 1009)    Or  pantoprazole (PROTONIX) injection 40 mg ( Intravenous See Alternative 12/13/18 1009)  traMADol (ULTRAM) tablet 50 mg (has no administration in time range)  acetaminophen (TYLENOL) tablet 650 mg (has no administration in time range)  methocarbamol (ROBAXIN) tablet 500 mg (has no administration in time range)  amLODipine (NORVASC) tablet 10 mg (10 mg Oral Given 12/13/18 1009)  Tdap (BOOSTRIX) injection 0.5 mL (0.5 mLs Intramuscular Given 12/12/18  1854)  iohexol (OMNIPAQUE) 300 MG/ML solution 100 mL (100 mLs Intravenous Contrast Given 12/12/18 1938)    Mobility walks with device     Focused Assessments Cardiac Assessment Handoff:    Lab Results  Component Value Date   CKTOTAL 75 05/30/2010   CKMB 1.3 05/30/2010   TROPONINI <0.30 04/14/2014   Lab Results  Component Value Date   DDIMER 3.71 (H) 04/13/2014   Does the Patient currently have chest pain? No     R Recommendations: See Admitting Provider Note  Report given to:   Additional Notes:

## 2018-12-13 NOTE — ED Notes (Signed)
Got patient reposition in the bed got patient some warm blankets patient is resting with call bell in reach

## 2018-12-14 LAB — CBC
HCT: 22.4 % — ABNORMAL LOW (ref 39.0–52.0)
HCT: 22.1 % — ABNORMAL LOW (ref 39.0–52.0)
Hemoglobin: 7.2 g/dL — ABNORMAL LOW (ref 13.0–17.0)
Hemoglobin: 7.2 g/dL — ABNORMAL LOW (ref 13.0–17.0)
MCH: 31.6 pg (ref 26.0–34.0)
MCH: 31.9 pg (ref 26.0–34.0)
MCHC: 32.1 g/dL (ref 30.0–36.0)
MCHC: 32.6 g/dL (ref 30.0–36.0)
MCV: 98.2 fL (ref 80.0–100.0)
MCV: 97.8 fL (ref 80.0–100.0)
Platelets: 155 10*3/uL (ref 150–400)
Platelets: 166 10*3/uL (ref 150–400)
RBC: 2.28 MIL/uL — ABNORMAL LOW (ref 4.22–5.81)
RBC: 2.26 MIL/uL — ABNORMAL LOW (ref 4.22–5.81)
RDW: 14.4 % (ref 11.5–15.5)
RDW: 14.1 % (ref 11.5–15.5)
WBC: 13.6 10*3/uL — ABNORMAL HIGH (ref 4.0–10.5)
WBC: 16.1 10*3/uL — ABNORMAL HIGH (ref 4.0–10.5)
nRBC: 0 % (ref 0.0–0.2)
nRBC: 0 % (ref 0.0–0.2)

## 2018-12-14 LAB — BASIC METABOLIC PANEL
Anion gap: 9 (ref 5–15)
BUN: 38 mg/dL — ABNORMAL HIGH (ref 8–23)
CO2: 21 mmol/L — ABNORMAL LOW (ref 22–32)
Calcium: 8.1 mg/dL — ABNORMAL LOW (ref 8.9–10.3)
Chloride: 108 mmol/L (ref 98–111)
Creatinine, Ser: 2.02 mg/dL — ABNORMAL HIGH (ref 0.61–1.24)
GFR calc Af Amer: 32 mL/min — ABNORMAL LOW (ref >60)
GFR calc non Af Amer: 28 mL/min — ABNORMAL LOW (ref >60)
Glucose, Bld: 99 mg/dL (ref 70–99)
Potassium: 4.8 mmol/L (ref 3.5–5.1)
Sodium: 138 mmol/L (ref 135–145)

## 2018-12-14 MED ORDER — SODIUM CHLORIDE 0.9 % IV SOLN
INTRAVENOUS | Status: DC
  Administered 2018-12-14 – 2018-12-17 (×5): via INTRAVENOUS

## 2018-12-14 MED ORDER — HALOPERIDOL LACTATE 5 MG/ML IJ SOLN
5.0000 mg | Freq: Four times a day (QID) | INTRAMUSCULAR | Status: DC | PRN
  Administered 2018-12-14 – 2018-12-15 (×2): 5 mg via INTRAVENOUS
  Filled 2018-12-14 (×2): qty 1

## 2018-12-14 MED ORDER — ENSURE ENLIVE PO LIQD
237.0000 mL | Freq: Two times a day (BID) | ORAL | Status: DC
  Administered 2018-12-14 – 2018-12-17 (×5): 237 mL via ORAL

## 2018-12-14 MED ORDER — HALOPERIDOL LACTATE 5 MG/ML IJ SOLN
INTRAMUSCULAR | Status: AC
  Filled 2018-12-14: qty 1

## 2018-12-14 MED ORDER — SODIUM CHLORIDE 0.9 % IV SOLN: 250.0000 mL | INTRAVENOUS | Status: DC | PRN

## 2018-12-14 NOTE — Progress Notes (Signed)
Patient ID: Larry Huffman, male   DOB: 02-21-1924, 85 y.o.   MRN: [id number]       Subjective: Patient is confused.  Oriented only to himself.  When laying in bed he doesn't complain of anything, but when he mobilizes he complains of pain in his right hip.  Doesn't like clear liquids.  Very unsteady on his feet  Objective: Vital signs in last 24 hours: Temp:  [97.6 F (36.4 C)-99.6 F (37.6 C)] 97.7 F (36.5 C) (06/04 0750) Pulse Rate:  [54-59] 55 (06/04 0750) Resp:  [12-20] 12 (06/04 0750) BP: (135-148)/(49-53) 138/49 (06/04 0750) SpO2:  [89 %-100 %] 100 % (06/04 0750) Last BM Date: (PTA)  Intake/Output from previous day: 06/03 0701 - 06/04 0700 In: 360 [P.O.:360] Out: 300 [Urine:300] Intake/Output this shift: No intake/output data recorded.  PE: Gen: NAD laying in bed Heart: regular Lungs: CTAB, chest wall is mildly tender Abd: soft, nontender except on right flank where he has ecchymosis and edema from hematoma.   Ext: ecchymosis now seen on right lateral thigh, likely run down from flank, buttock hematoma  Lab Results:  Recent Labs    12/13/18 1649 12/14/18 0132  WBC 14.7* 13.6*  HGB 7.6* 7.2*  HCT 23.0* 22.4*  PLT 102* 155   BMET Recent Labs    12/13/18 0830 12/14/18 0132  NA 140 138  K 5.4* 4.8  CL 109 108  CO2 21* 21*  GLUCOSE 116* 99  BUN 37* 38*  CREATININE 2.08* 2.02*  CALCIUM 8.1* 8.1*   PT/INR No results for input(s): LABPROT, INR in the last 72 hours. CMP     Component Value Date/Time   NA 138 12/14/2018 0132   NA 134 (A) 01/27/2015   K 4.8 12/14/2018 0132   CL 108 12/14/2018 0132   CO2 21 (L) 12/14/2018 0132   GLUCOSE 99 12/14/2018 0132   BUN 38 (H) 12/14/2018 0132   BUN 16 01/27/2015   CREATININE 2.02 (H) 12/14/2018 0132   CALCIUM 8.1 (L) 12/14/2018 0132   PROT 6.3 (L) 12/12/2018 1751   ALBUMIN 3.0 (L) 12/12/2018 1751   AST 31 12/12/2018 1751   ALT 20 12/12/2018 1751   ALKPHOS 58 12/12/2018 1751   BILITOT 0.7 12/12/2018 1751     GFRNONAA 28 (L) 12/14/2018 0132   GFRAA 32 (L) 12/14/2018 0132   Lipase     Component Value Date/Time   LIPASE 18 04/06/2008 1426       Studies/Results: Dg Chest 2 View  Result Date: 12/12/2018 CLINICAL DATA:  Unwitnessed fall. Pain. EXAM: CHEST - 2 VIEW COMPARISON:  10/14/2017. FINDINGS: The heart is enlarged. Mild vascular congestion. No consolidation or edema. No definite rib fracture on this portable semi erect chest. Calcified tortuous aorta. Minimal blunting at both lung bases. BILATERAL shoulder surgery. Similar appearance to priors. IMPRESSION: Cardiomegaly with mild vascular congestion. No consolidation or edema. Electronically Signed   By: Staci Righter M.D.   On: 12/12/2018 18:10   Ct Head Wo Contrast  Result Date: 12/12/2018 CLINICAL DATA:  Unwitnessed fall down 8 steps. Dementia. EXAM: CT HEAD WITHOUT CONTRAST CT CERVICAL SPINE WITHOUT CONTRAST TECHNIQUE: Multidetector CT imaging of the head and cervical spine was performed following the standard protocol without intravenous contrast. Multiplanar CT image reconstructions of the cervical spine were also generated. COMPARISON:  06/20/2018. FINDINGS: CT HEAD FINDINGS Brain: Stable moderately enlarged ventricles and subarachnoid spaces. Stable moderate patchy white matter low density in both cerebral hemispheres. 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 No  intracranial hemorrhage, mass lesion or CT evidence of acute infarction. Vascular: No hyperdense vessel or unexpected calcification. Skull: Normal. Negative for fracture or focal lesion. Sinuses/Orbits: Status post bilateral cataract extraction. Unremarkable paranasal sinuses. Other: Right posterior scalp hematoma near the vertex. CT CERVICAL SPINE FINDINGS Alignment: Minimal anterolisthesis at the C2-3, C3-4, C4-5 levels. Mild retrolisthesis at the C5-6 level and mild anterolisthesis at the C6-7 level. Skull base and vertebrae: No acute fracture. No primary bone lesion or focal  pathologic process. Soft tissues and spinal canal: No prevertebral fluid or swelling. No visible canal hematoma. Disc levels: Multilevel degenerative changes, most pronounced at the C5-6 level. Upper chest: Mild biapical pleural and parenchymal scarring some pleural calcification. Other: Bilateral carotid artery calcifications. IMPRESSION: 1. Right posterior scalp hematoma without skull fracture or intracranial hemorrhage. 2. 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 3. No cervical spine fracture or traumatic subluxation. 4. Multilevel cervical spine degenerative changes. 5. Bilateral carotid artery atheromatous calcifications. Electronically Signed   By: Claudie Revering M.D.   On: 12/12/2018 20:07   Ct Chest W Contrast  Result Date: 12/12/2018 CLINICAL DATA:  [address] downstairs.  Right-sided abdominal pain. EXAM: CT CHEST, ABDOMEN, AND PELVIS WITH CONTRAST TECHNIQUE: Multidetector CT imaging of the chest, abdomen and pelvis was performed following the standard protocol during bolus administration of intravenous contrast. CONTRAST:  158mL OMNIPAQUE IOHEXOL 300 MG/ML  SOLN COMPARISON:  None. FINDINGS: CT CHEST FINDINGS Cardiovascular: The heart is upper limits of normal in size for age. No pericardial effusion. There is tortuosity, ectasia and calcification of the thoracic aorta but no focal aneurysm or dissection. The branch vessels are patent. Moderate tortuosity and atherosclerotic calcifications. Three-vessel coronary artery calcifications. Mediastinum/Nodes: No mediastinal or hilar mass or adenopathy or hematoma. Small scattered lymph nodes are noted. The esophagus is grossly normal. Lungs/Pleura: Small bilateral pleural effusions with overlying atelectasis. No pulmonary contusion, pneumonia or pneumothorax. No worrisome pulmonary lesions. Musculoskeletal: The chest wall is unremarkable. No chest wall mass or hematoma. No supraclavicular or axillary  adenopathy. There are mildly displaced right fourth and fifth lateral rib fractures. The sternum is intact. There is a T8 compression deformity but this is stable when compared to prior chest CT from 2015. No acute thoracic vertebral body fractures. CT ABDOMEN PELVIS FINDINGS Hepatobiliary: No acute hepatic injury. No perihepatic fluid collection. No worrisome hepatic lesions. Small gallstone noted the gallbladder. No common bile duct dilatation. Pancreas: Advanced pancreatic atrophy but no mass or acute inflammation. No acute injury. There is marked dilatation of the main pancreatic duct in the body region the pancreas and there may be an obstructing calculus. This appears stable since 2015. Spleen: No acute splenic injury.  No perisplenic fluid collection. Adrenals/Urinary Tract: In the adrenal glands are unremarkable. No acute renal injury is identified. Both kidneys demonstrate mild age-related renal cortical thinning. There are cysts bilaterally but no worrisome lesions. The bladder is unremarkable. Stomach/Bowel: The stomach, duodenum, small bowel and colon are grossly normal without oral contrast. No acute inflammatory changes, mass lesions or obstructive findings. Vascular/Lymphatic: Advanced atherosclerotic calcifications involving the aorta and branch vessels but no aneurysm or dissection. No mesenteric or retroperitoneal mass or adenopathy. Reproductive: The prostate gland and seminal vesicles are surgically absent. A penile prosthesis is noted with a reservoir near the right side of the bladder. Other: No free abdominal/pelvic fluid collections, hematoma or free air. Musculoskeletal: There is a moderate-sized hematoma in the right flank area with suspected active extravasation of  contrast. Is also a large hematoma involving the right upper gluteal muscles with active extravasation of contrast in the upper gluteus medius muscle. The bony pelvis is intact. No pelvic or hip fractures are identified. There  is a smooth depression involving the inferior endplate of L3 which is likely a Schmorl's node or old compression fracture. No definite acute compression fractures. Advanced facet disease noted. IMPRESSION: 1. Mildly displaced right fourth and fifth lateral rib fractures with associated small pleural hematoma/pleural fluid. No pneumothorax is identified. 2. Small bilateral pleural effusions and overlying atelectasis but no pulmonary contusion or infiltrate. 3. The heart and great vessels are unremarkable except for advanced vascular calcifications. No acute aortic or branch vessel injury is identified. 4. Moderate-sized subcutaneous hematoma involving the right flank area and also moderate-sized hematoma involving the right upper gluteal muscles. Both these areas demonstrate active extravasation of contrast material. 5. No definite spine, pelvis or hip fractures. 6. No CT findings suspicious for solid organ injury in the abdomen or pelvis. Electronically Signed   By: Marijo Sanes M.D.   On: 12/12/2018 20:15   Ct Cervical Spine Wo Contrast  Result Date: 12/12/2018 CLINICAL DATA:  Unwitnessed fall down 8 steps. Dementia. EXAM: CT HEAD WITHOUT CONTRAST CT CERVICAL SPINE WITHOUT CONTRAST TECHNIQUE: Multidetector CT imaging of the head and cervical spine was performed following the standard protocol without intravenous contrast. Multiplanar CT image reconstructions of the cervical spine were also generated. COMPARISON:  06/20/2018. FINDINGS: CT HEAD FINDINGS Brain: Stable moderately enlarged ventricles and subarachnoid spaces. Stable moderate patchy white matter low density in both cerebral hemispheres. 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 No intracranial hemorrhage, mass lesion or CT evidence of acute infarction. Vascular: No hyperdense vessel or unexpected calcification. Skull: Normal. Negative for fracture or focal lesion. Sinuses/Orbits: Status post bilateral cataract extraction. Unremarkable paranasal  sinuses. Other: Right posterior scalp hematoma near the vertex. CT CERVICAL SPINE FINDINGS Alignment: Minimal anterolisthesis at the C2-3, C3-4, C4-5 levels. Mild retrolisthesis at the C5-6 level and mild anterolisthesis at the C6-7 level. Skull base and vertebrae: No acute fracture. No primary bone lesion or focal pathologic process. Soft tissues and spinal canal: No prevertebral fluid or swelling. No visible canal hematoma. Disc levels: Multilevel degenerative changes, most pronounced at the C5-6 level. Upper chest: Mild biapical pleural and parenchymal scarring some pleural calcification. Other: Bilateral carotid artery calcifications. IMPRESSION: 1. Right posterior scalp hematoma without skull fracture or intracranial hemorrhage. 2. 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 3. No cervical spine fracture or traumatic subluxation. 4. Multilevel cervical spine degenerative changes. 5. Bilateral carotid artery atheromatous calcifications. Electronically Signed   By: Claudie Revering M.D.   On: 12/12/2018 20:07   Ct Lumbar Spine Wo Contrast  Result Date: 12/12/2018 CLINICAL DATA:  [place] [address].  Back pain. EXAM: CT LUMBAR SPINE WITHOUT CONTRAST TECHNIQUE: Multidetector CT imaging of the lumbar spine was performed without intravenous contrast administration. Multiplanar CT image reconstructions were also generated. COMPARISON:  Lumbar spine radiographs 10/14/2017 FINDINGS: Segmentation: There are five lumbar type vertebral bodies. The last full intervertebral disc space is labeled L5-S1. Alignment: Degenerative lumbar spondylosis with degenerative retrolisthesis of L4 which appears stable since prior radiographs. Vertebrae: Remote/stable inferior endplate depression of L3 which could be a Schmorl's node or old compression fracture. Moderate osteoporosis but no definite acute lumbar fractures. Advanced facet disease is noted in the spine and is evidence of prior  lumbar surgery with right-sided laminectomy at L3-4 and wide laminectomy  at L4-5. The visualized bony pelvis is intact. Paraspinal and other soft tissues: No significant findings. Advanced atherosclerotic calcifications are noted involving the aorta and branch vessels. Renal cysts are also noted. Disc levels: No large disc protrusions are identified. No significant spinal or foraminal stenosis. IMPRESSION: 1. No acute lumbar fractures. 2. Remote inferior endplate compression deformity of L3. 3. Degenerative lumbar spondylosis with multilevel disc disease and facet disease and postoperative changes at L3-4 and L4-5. Electronically Signed   By: Marijo Sanes M.D.   On: 12/12/2018 20:21   Ct Abdomen Pelvis W Contrast  Result Date: 12/12/2018 CLINICAL DATA:  [place] [address] downstairs.  Right-sided abdominal pain. EXAM: CT CHEST, ABDOMEN, AND PELVIS WITH CONTRAST TECHNIQUE: Multidetector CT imaging of the chest, abdomen and pelvis was performed following the standard protocol during bolus administration of intravenous contrast. CONTRAST:  185mL OMNIPAQUE IOHEXOL 300 MG/ML  SOLN COMPARISON:  None. FINDINGS: CT CHEST FINDINGS Cardiovascular: The heart is upper limits of normal in size for age. No pericardial effusion. There is tortuosity, ectasia and calcification of the thoracic aorta but no focal aneurysm or dissection. The branch vessels are patent. Moderate tortuosity and atherosclerotic calcifications. Three-vessel coronary artery calcifications. Mediastinum/Nodes: No mediastinal or hilar mass or adenopathy or hematoma. Small scattered lymph nodes are noted. The esophagus is grossly normal. Lungs/Pleura: Small bilateral pleural effusions with overlying atelectasis. No pulmonary contusion, pneumonia or pneumothorax. No worrisome pulmonary lesions. Musculoskeletal: The chest wall is unremarkable. No chest wall mass or hematoma. No supraclavicular or axillary adenopathy. There are mildly displaced right fourth and fifth lateral  rib fractures. The sternum is intact. There is a T8 compression deformity but this is stable when compared to prior chest CT from 2015. No acute thoracic vertebral body fractures. CT ABDOMEN PELVIS FINDINGS Hepatobiliary: No acute hepatic injury. No perihepatic fluid collection. No worrisome hepatic lesions. Small gallstone noted the gallbladder. No common bile duct dilatation. Pancreas: Advanced pancreatic atrophy but no mass or acute inflammation. No acute injury. There is marked dilatation of the main pancreatic duct in the body region the pancreas and there may be an obstructing calculus. This appears stable since 2015. Spleen: No acute splenic injury.  No perisplenic fluid collection. Adrenals/Urinary Tract: In the adrenal glands are unremarkable. No acute renal injury is identified. Both kidneys demonstrate mild age-related renal cortical thinning. There are cysts bilaterally but no worrisome lesions. The bladder is unremarkable. Stomach/Bowel: The stomach, duodenum, small bowel and colon are grossly normal without oral contrast. No acute inflammatory changes, mass lesions or obstructive findings. Vascular/Lymphatic: Advanced atherosclerotic calcifications involving the aorta and branch vessels but no aneurysm or dissection. No mesenteric or retroperitoneal mass or adenopathy. Reproductive: The prostate gland and seminal vesicles are surgically absent. A penile prosthesis is noted with a reservoir near the right side of the bladder. Other: No free abdominal/pelvic fluid collections, hematoma or free air. Musculoskeletal: There is a moderate-sized hematoma in the right flank area with suspected active extravasation of contrast. Is also a large hematoma involving the right upper gluteal muscles with active extravasation of contrast in the upper gluteus medius muscle. The bony pelvis is intact. No pelvic or hip fractures are identified. There is a smooth depression involving the inferior endplate of L3 which is  likely a Schmorl's node or old compression fracture. No definite acute compression fractures. Advanced facet disease noted. IMPRESSION: 1. Mildly displaced right fourth and fifth lateral rib fractures with associated small pleural hematoma/pleural fluid. No pneumothorax is identified. 2. Small bilateral pleural effusions and overlying  atelectasis but no pulmonary contusion or infiltrate. 3. The heart and great vessels are unremarkable except for advanced vascular calcifications. No acute aortic or branch vessel injury is identified. 4. Moderate-sized subcutaneous hematoma involving the right flank area and also moderate-sized hematoma involving the right upper gluteal muscles. Both these areas demonstrate active extravasation of contrast material. 5. No definite spine, pelvis or hip fractures. 6. No CT findings suspicious for solid organ injury in the abdomen or pelvis. Electronically Signed   By: Marijo Sanes M.D.   On: 12/12/2018 20:15    Anti-infectives: Anti-infectives (From admission, onward)   None       Assessment/Plan Fall down steps Scalp hematoma R rib FX 4,5 with no PTX- pulmonary toilet, multimodal pain control, PT/OT Large R flank and buttock hematoma with some muscular extravasationPlace abdominal binder. This should stop on its own. Hold ASA and Plavix. CBC in am ABL anemia - hgb down to 7.2 this am.  Repeat CBC in am.  May need some blood AKI - creatinine 2.02 today.  Continue hydration.  Tried to stand to void when I was in there and couldn't.  Only 300cc documented yesterday.  Will have RN bladder scan patient.  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

## 2018-12-14 NOTE — Progress Notes (Signed)
Occupational Therapy Evaluation Patient Details Name: Larry Huffman MRN: [id number] DOB: 06/12/1924 Today's Date: 12/14/2018    History of Present Illness Pt is a 85 y.o. M with significant PMH of dementia, CAD, PE, who presents after a fall down 8 steps with large R flank and buttock hematoma, scalp hematoma, and right rib fx 4,5.   Clinical Impression   Pt admitted due to above. Pt is from home and lives with daughter (per PA) but unable to provide PLOF or home living information due to impaired cognition (h/o dementia). Pt was not as restless as during PT session but very lethargic and difficult to wake up.  Session limited due to lethargy. Pt mod-max with ADLs at chair level during session. Recommending SNF for discharge planning. Will continue to follow acutely.    Follow Up Recommendations  SNF;Supervision/Assistance - 24 hour    Equipment Recommendations  (defer to next venue)    Recommendations for Other Services       Precautions / Restrictions Precautions Precautions: Fall Required Braces or Orthoses: Other Brace Other Brace: abdominal binder Restrictions Weight Bearing Restrictions: No      Mobility Bed Mobility Overal bed mobility: (pt up in chair)  Transfers Overall transfer level: (did not attempt mobility during this session)        Balance                         ADL either performed or assessed with clinical judgement   ADL Overall ADL's : Needs assistance/impaired     Grooming: Wash/dry hands;Wash/dry face;Brushing hair;Moderate assistance;Sitting   Upper Body Bathing: Moderate assistance;Sitting   Lower Body Bathing: Maximal assistance;Sitting/lateral leans   Upper Body Dressing : Moderate assistance;Sitting   Lower Body Dressing: Sitting/lateral leans;Maximal assistance                 General ADL Comments: Pt sleeping very soundly in chair upon OT arrival (snoring) and was difficult to arouse. Pt became more alert when  therapist put his LEs down in recliner. Pt difficulty following commands/requests largely due to [facility] but also poor cognition.     Vision         Perception     Praxis      Pertinent Vitals/Pain Pain Assessment: Faces Faces Pain Scale: Hurts little more Pain Location: right hip Pain Descriptors / Indicators: Grimacing;Moaning Pain Intervention(s): Limited activity within patient's tolerance;Monitored during session;Repositioned     Hand Dominance     Extremity/Trunk Assessment Upper Extremity Assessment Upper Extremity Assessment: Generalized weakness   Lower Extremity Assessment Lower Extremity Assessment: Defer to PT evaluation   Cervical / Trunk Assessment Cervical / Trunk Assessment: Kyphotic   Communication Communication Communication: HOH(speak into the left ear)   Cognition Arousal/Alertness: Lethargic(more alert with some movement) Behavior During Therapy: (lethargic during session) Overall Cognitive Status: History of cognitive impairments - at baseline                                 General Comments: History of dementia at baseline. Daughter reports (per PA) that "some days are better than others" with memory.   General Comments       Exercises     Shoulder Instructions      Home Living Family/patient expects to be discharged to:: Skilled nursing facility  Prior Functioning/Environment Level of Independence: Needs assistance        Comments: Pt unable to provide information. Spoke with PA who spoke with pt's daughter- pt's daughter lives with him and has been working from home during [employer]. Suspect he was home alone prior to Covid when daughter went to work.        OT Problem List: Decreased strength;Decreased activity tolerance;Impaired balance (sitting and/or standing);Decreased cognition;Decreased safety awareness;Decreased knowledge of use of DME or AE;Pain      OT  Treatment/Interventions: Self-care/ADL training;DME and/or AE instruction;Therapeutic activities;Patient/family education;Balance training    OT Goals(Current goals can be found in the care plan section) Acute Rehab OT Goals Patient Stated Goal: pt didn't state OT Goal Formulation: Patient unable to participate in goal setting Time For Goal Achievement: 12/28/18 Potential to Achieve Goals: Fair  OT Frequency: Min 2X/week   Barriers to D/C:            Co-evaluation              AM-PAC OT "6 Clicks" Daily Activity     Outcome Measure Help from another person eating meals?: A Little Help from another person taking care of personal grooming?: A Lot Help from another person toileting, which includes using toliet, bedpan, or urinal?: A Lot Help from another person bathing (including washing, rinsing, drying)?: A Lot Help from another person to put on and taking off regular upper body clothing?: A Lot Help from another person to put on and taking off regular lower body clothing?: A Lot 6 Click Score: 13   End of Session    Activity Tolerance: Patient limited by lethargy Patient left: in chair;with chair alarm set(posey belt alarm)  OT Visit Diagnosis: Unsteadiness on feet (R26.81);Muscle weakness (generalized) (M62.81);Other symptoms and signs involving cognitive function;Pain Pain - Right/Left: Right Pain - part of body: Hip                Time: 0932-6712 OT Time Calculation (min): 12 min Charges:  OT General Charges $OT Visit: 1 Visit OT Evaluation $OT Eval Moderate Complexity: 1 Mod    Darrol Jump OTR/L [place]  [phone] 12/14/2018, 2:28 PM

## 2018-12-14 NOTE — Evaluation (Addendum)
Physical Therapy Evaluation Patient Details Name: Larry Huffman MRN: [id number] DOB: 12/25/1923 Today's Date: 12/14/2018   History of Present Illness  Pt is a 85 y.o. M with significant PMH of dementia, CAD, PE, who presents after a fall down 8 steps with large R flank and buttock hematoma, scalp hematoma, and right rib fx 4,5.  Clinical Impression  Pt admitted with above. Pt received on edge of bed with RN and PA present. Pt presenting with decreased functional mobility secondary to generalized weakness, right flank/hip pain, poor balance and baseline cognitive deficits. Pt alert to self only, very restless and requesting to continuously urinate although he had a catheter on. Pt requiring maximal assistance for bed mobility and two person moderate assistance for transferring from bed to chair with walker. Unable to walk. Recommending SNF at discharge to maximize functional independence and decrease caregiver burden.     Follow Up Recommendations SNF;Supervision/Assistance - 24 hour    Equipment Recommendations  Other (comment)(defer)    Recommendations for Other Services       Precautions / Restrictions Precautions Precautions: Fall Required Braces or Orthoses: Other Brace Other Brace: abdominal binder Restrictions Weight Bearing Restrictions: No      Mobility  Bed Mobility Overal bed mobility: Needs Assistance Bed Mobility: Supine to Sit;Sit to Supine     Supine to sit: Max assist     General bed mobility comments: MaxA for bed mobility (performed by RN prior to PT entrance)  Transfers Overall transfer level: Needs assistance Equipment used: Rolling walker (2 wheeled) Transfers: Sit to/from [facility] Sit to Stand: Mod assist;+2 physical assistance Stand pivot transfers: Mod assist;+2 physical assistance       General transfer comment: Requires modA +2 for lifting, manual assistance for turning walker with trunk flexed posture. Cues for sequencing,  direction. Pt fearful of falling  Ambulation/Gait                Stairs            Wheelchair Mobility    Modified Rankin (Stroke Patients Only)       Balance Overall balance assessment: Needs assistance Sitting-balance support: Feet supported Sitting balance-Leahy Scale: Fair     Standing balance support: Bilateral upper extremity supported Standing balance-Leahy Scale: Poor Standing balance comment: reliant on external support                             Pertinent Vitals/Pain Pain Assessment: Faces Faces Pain Scale: Hurts even more Pain Location: right flank, hip Pain Descriptors / Indicators: Grimacing;Guarding;Moaning Pain Intervention(s): Limited activity within patient's tolerance;Monitored during session    Home Living Family/patient expects to be discharged to:: Unsure                      Prior Function Level of Independence: Needs assistance         Comments: Pt unable to provide      Hand Dominance        Extremity/Trunk Assessment   Upper Extremity Assessment Upper Extremity Assessment: Generalized weakness    Lower Extremity Assessment Lower Extremity Assessment: Generalized weakness    Cervical / Trunk Assessment Cervical / Trunk Assessment: Kyphotic  Communication   Communication: HOH(hears better out of left ear)  Cognition Arousal/Alertness: Awake/alert Behavior During Therapy: Restless Overall Cognitive Status: History of cognitive impairments - at baseline  General Comments: History of dementia at baseline, oriented to self only. Continuously asking to urinate and to stand although he had a catheter on; unable to comprehend with repeated intruction. Pt stating, "they dropped me off and left me here."      General Comments      Exercises     Assessment/Plan    PT Assessment Patient needs continued PT services  PT Problem List Decreased  strength;Decreased activity tolerance;Decreased balance;Decreased mobility;Decreased cognition;Pain       PT Treatment Interventions DME instruction;Gait training;Functional mobility training;Therapeutic activities;Therapeutic exercise;Balance training;Patient/family education    PT Goals (Current goals can be found in the Care Plan section)  Acute Rehab PT Goals Patient Stated Goal: "to pee." PT Goal Formulation: With patient Time For Goal Achievement: 12/28/18 Potential to Achieve Goals: Fair    Frequency Min 3X/week   Barriers to discharge        Co-evaluation               AM-PAC PT "6 Clicks" Mobility  Outcome Measure Help needed turning from your back to your side while in a flat bed without using bedrails?: A Little Help needed moving from lying on your back to sitting on the side of a flat bed without using bedrails?: Total Help needed moving to and from a bed to a chair (including a wheelchair)?: A Lot Help needed standing up from a chair using your arms (e.g., wheelchair or bedside chair)?: A Lot Help needed to walk in hospital room?: Total Help needed climbing 3-5 steps with a railing? : Total 6 Click Score: 10    End of Session Equipment Utilized During Treatment: Gait belt Activity Tolerance: Patient tolerated treatment well Patient left: in chair;with call bell/phone within reach;with chair alarm set Nurse Communication: Mobility status PT Visit Diagnosis: Unsteadiness on feet (R26.81);Other abnormalities of gait and mobility (R26.89);Pain;Difficulty in walking, not elsewhere classified (R26.2) Pain - Right/Left: Right Pain - part of body: Hip    Time: 1771-1657 PT Time Calculation (min) (ACUTE ONLY): 15 min   Charges:   PT Evaluation $PT Eval Moderate Complexity: 1 Mod          Ellamae Sia, [place], DPT Acute Rehabilitation Services Pager [phone] Office [phone]   Willy Eddy 12/14/2018, 1:57 PM

## 2018-12-14 NOTE — Progress Notes (Signed)
Physical Therapy Treatment Patient Details Name: Larry Huffman MRN: [id number] DOB: [birth year] Today's Date: 12/14/2018    History of Present Illness Pt is a 85 y.o. M with significant PMH of dementia, CAD, PE, who presents after a fall down 8 steps with large R flank and buttock hematoma, scalp hematoma, and right rib fx 4,5.    PT Comments    Pt seen additional session as he was restless in chair and attempting to pull condom catheter off. Requiring two person moderate assist for transfer back to bed from chair (with RN). Pt with complaints of increased right hip pain, RN providing pain medication.    Follow Up Recommendations  SNF;Supervision/Assistance - 24 hour     Equipment Recommendations  Other (comment)(defer)    Recommendations for Other Services       Precautions / Restrictions Precautions Precautions: Fall Required Braces or Orthoses: Other Brace Other Brace: abdominal binder Restrictions Weight Bearing Restrictions: No    Mobility  Bed Mobility Overal bed mobility: Needs Assistance Bed Mobility: Sit to Supine     Supine to sit: Max assist;+2 for physical assistance     General bed mobility comments: Pt requiring maxA + 2 to lie down, limited by confusion regarding task  Transfers Overall transfer level: Needs assistance Equipment used: None Transfers: Sit to/from [facility] Sit to Stand: Mod assist;+2 physical assistance Stand pivot transfers: Mod assist;+2 physical assistance       General transfer comment: Requires modA +2 for lifting, manual assistance for turning walker with trunk flexed posture. Cues for sequencing, direction. Pt fearful of falling, very stiff  Ambulation/Gait                 Stairs             Wheelchair Mobility    Modified Rankin (Stroke Patients Only)       Balance Overall balance assessment: Needs assistance Sitting-balance support: Feet supported Sitting balance-Leahy Scale:  Fair     Standing balance support: Bilateral upper extremity supported Standing balance-Leahy Scale: Poor Standing balance comment: reliant on external support                            Cognition Arousal/Alertness: Awake/alert Behavior During Therapy: Restless Overall Cognitive Status: History of cognitive impairments - at baseline                                 General Comments: History of dementia at baseline, oriented to self only. Continuously asking to urinate and to stand although he had a catheter on; unable to comprehend with repeated intruction. Pt stating, "they dropped me off and left me here."      Exercises      General Comments        Pertinent Vitals/Pain Pain Assessment: Faces Faces Pain Scale: Hurts even more Pain Location: right flank, hip Pain Descriptors / Indicators: Grimacing;Guarding;Moaning Pain Intervention(s): Limited activity within patient's tolerance;Monitored during session;RN gave pain meds during session    Home Living Family/patient expects to be discharged to:: Unsure                    Prior Function Level of Independence: Needs assistance      Comments: Pt unable to provide    PT Goals (current goals can now be found in the care plan section) Acute Rehab PT Goals Patient  Stated Goal: "to pee." PT Goal Formulation: With patient Time For Goal Achievement: 12/28/18 Potential to Achieve Goals: Fair Progress towards PT goals: Progressing toward goals    Frequency    Min 3X/week      PT Plan Current plan remains appropriate    Co-evaluation              AM-PAC PT "6 Clicks" Mobility   Outcome Measure  Help needed turning from your back to your side while in a flat bed without using bedrails?: A Little Help needed moving from lying on your back to sitting on the side of a flat bed without using bedrails?: Total Help needed moving to and from a bed to a chair (including a wheelchair)?:  A Lot Help needed standing up from a chair using your arms (e.g., wheelchair or bedside chair)?: A Lot Help needed to walk in hospital room?: Total Help needed climbing 3-5 steps with a railing? : Total 6 Click Score: 10    End of Session Equipment Utilized During Treatment: Gait belt Activity Tolerance: Patient tolerated treatment well Patient left: in bed;with call bell/phone within reach;with bed alarm set;with nursing/sitter in room Nurse Communication: Mobility status PT Visit Diagnosis: Unsteadiness on feet (R26.81);Other abnormalities of gait and mobility (R26.89);Pain;Difficulty in walking, not elsewhere classified (R26.2) Pain - Right/Left: Right Pain - part of body: Hip     Time: 1137-1150 PT Time Calculation (min) (ACUTE ONLY): 13 min  Charges:  $Therapeutic Activity: 8-22 mins                     Ellamae Sia, PT, DPT Acute Rehabilitation Services Pager [phone] Office [phone]    Willy Eddy 12/14/2018, 2:07 PM

## 2018-12-14 NOTE — Care Management (Signed)
Pt unable to participate in SBIRT at this time due to altered mental status.    Reinaldo Raddle, RN, BSN  Trauma/Neuro ICU Case Manager [phone]

## 2018-12-15 LAB — HEMOGLOBIN AND HEMATOCRIT, BLOOD
HCT: 23.4 % — ABNORMAL LOW (ref 39.0–52.0)
Hemoglobin: 7.8 g/dL — ABNORMAL LOW (ref 13.0–17.0)

## 2018-12-15 LAB — BASIC METABOLIC PANEL
Anion gap: 8 (ref 5–15)
BUN: 37 mg/dL — ABNORMAL HIGH (ref 8–23)
CO2: 20 mmol/L — ABNORMAL LOW (ref 22–32)
Calcium: 8 mg/dL — ABNORMAL LOW (ref 8.9–10.3)
Chloride: 112 mmol/L — ABNORMAL HIGH (ref 98–111)
Creatinine, Ser: 1.89 mg/dL — ABNORMAL HIGH (ref 0.61–1.24)
GFR calc Af Amer: 35 mL/min — ABNORMAL LOW (ref >60)
GFR calc non Af Amer: 30 mL/min — ABNORMAL LOW (ref >60)
Glucose, Bld: 140 mg/dL — ABNORMAL HIGH (ref 70–99)
Potassium: 4.4 mmol/L (ref 3.5–5.1)
Sodium: 140 mmol/L (ref 135–145)

## 2018-12-15 LAB — CBC
HCT: 20.1 % — ABNORMAL LOW (ref 39.0–52.0)
Hemoglobin: 6.5 g/dL — CL (ref 13.0–17.0)
MCH: 32 pg (ref 26.0–34.0)
MCHC: 32.3 g/dL (ref 30.0–36.0)
MCV: 99 fL (ref 80.0–100.0)
Platelets: 158 10*3/uL (ref 150–400)
RBC: 2.03 MIL/uL — ABNORMAL LOW (ref 4.22–5.81)
RDW: 14.1 % (ref 11.5–15.5)
WBC: 15.3 10*3/uL — ABNORMAL HIGH (ref 4.0–10.5)
nRBC: 0 % (ref 0.0–0.2)

## 2018-12-15 LAB — PREPARE RBC (CROSSMATCH)

## 2018-12-15 MED ORDER — SODIUM CHLORIDE 0.9% IV SOLUTION: Freq: Once | INTRAVENOUS | Status: DC

## 2018-12-15 NOTE — NC FL2 (Signed)
[place] LEVEL OF CARE SCREENING TOOL     IDENTIFICATION  Patient Name: Larry Huffman Birthdate: 11-Dec-1923 Sex: male Admission Date (Current Location): 12/12/2018  [facility] and [place] Number:  Herbalist and Address:  [facility] [place], [place] [address] [place], [place] [address]      Provider Number: [id number]  Attending Physician Name and Address:  Md, Trauma, MD  Relative Name and Phone Number:  [name]; daughter; [phone]    Current Level of Care: Hospital Recommended Level of Care: [place] Prior Approval Number:    Date Approved/Denied:   PASRR Number: [id number]  Discharge Plan: SNF    Current Diagnoses: Patient Active Problem List   Diagnosis Date Noted  . Right flank hematoma 12/12/2018  . Long-term (current) use of anticoagulants 02/21/2015  . Allergic rhinitis 01/24/2015  . Malnutrition of moderate degree ([place]) 01/16/2015  . Warfarin-induced coagulopathy ([place])   . Melena   . Severe anemia 01/15/2015  . GI bleed 01/15/2015  . Acute blood loss anemia 01/15/2015  . Chronic anticoagulation 06/04/2014  . Dementia ([place]) 04/19/2014  . Chronic diastolic heart failure ([place]) 04/15/2014  . Chronic kidney disease (CKD), stage III (moderate) ([place]) 04/15/2014  . Pulmonary embolism ([place]) 04/13/2014  . Spinal stenosis, lumbar region, with neurogenic claudication 02/08/2014    Class: Diagnosis of  . Peripheral arterial disease: History of left carotid endarterectomy 03/15/2013  . Obstructive sleep apnea: Uses CPAP 03/15/2013  . Venous insufficiency 03/15/2013  . Essential hypertension 03/15/2013  . HLD (hyperlipidemia) 03/15/2013  . Chest pain 07/28/2012  . CAD - CFX DES 7/07, LAD DES 10/08, cath 11/11- medical Rx 07/28/2012    Orientation RESPIRATION BLADDER Height & Weight     Self  O2(1L nasal canula) Incontinent, External catheter Weight:   Height:     BEHAVIORAL SYMPTOMS/MOOD  NEUROLOGICAL BOWEL NUTRITION STATUS      Continent Diet(see discharge summary)  AMBULATORY STATUS COMMUNICATION OF NEEDS Skin   Extensive Assist Verbally Skin abrasions, Other (Comment)(generalized ecchymosis and abrasions from fall; skin tear on R arm)                       Personal Care Assistance Level of Assistance  Bathing, Feeding, Dressing Bathing Assistance: Maximum assistance Feeding assistance: Limited assistance Dressing Assistance: Maximum assistance     Functional Limitations Info  Sight, Hearing, Speech Sight Info: Adequate Hearing Info: Adequate Speech Info: Adequate    SPECIAL CARE FACTORS FREQUENCY  OT (By licensed OT), PT (By licensed PT)     PT Frequency: 5x week OT Frequency: 5x week            Contractures Contractures Info: Not present    Additional Factors Info  Code Status, Allergies, Isolation Precautions Code Status Info: Full Code Allergies Info: CIPROFLOXACIN, TAPE, DAPSONE      Isolation Precautions Info: Contact for MRSA     Current Medications (12/15/2018):  This is the current hospital active medication list Current Facility-Administered Medications  Medication Dose Route Frequency Provider Last Rate Last Dose  . 0.9 %  sodium chloride infusion (Manually program via Guardrails IV Fluids)   Intravenous Once Ileana Roup, MD      . 0.9 %  sodium chloride infusion  250 mL Intravenous PRN Saverio Danker, PA-C      . 0.9 %  sodium chloride infusion   Intravenous Continuous Saverio Danker, PA-C 100 mL/hr at 12/14/18 1020    .  acetaminophen (TYLENOL) tablet 650 mg  650 mg Oral Q6H PRN Georganna Skeans, MD      . amLODipine (NORVASC) tablet 10 mg  10 mg Oral Daily Georganna Skeans, MD   10 mg at 12/14/18 0932  . Chlorhexidine Gluconate Cloth 2 % PADS 6 each  6 each Topical Q0600 Georganna Skeans, MD   6 each at 12/15/18 1000  . feeding supplement (ENSURE ENLIVE) (ENSURE ENLIVE) liquid 237 mL  237 mL Oral BID BM Saverio Danker, PA-C    237 mL at 12/15/18 1100  . haloperidol lactate (HALDOL) injection 5 mg  5 mg Intravenous Q6H PRN Saverio Danker, PA-C   5 mg at 12/15/18 0257  . methocarbamol (ROBAXIN) tablet 500 mg  500 mg Oral Q8H PRN Georganna Skeans, MD      . morphine 4 MG/ML injection 4 mg  4 mg Intravenous Q2H PRN Georganna Skeans, MD   4 mg at 12/14/18 2256  . mupirocin ointment (BACTROBAN) 2 % 1 application  1 application Nasal BID Georganna Skeans, MD   1 application at 77/82/42 1100  . ondansetron (ZOFRAN-ODT) disintegrating tablet 4 mg  4 mg Oral Q6H PRN Georganna Skeans, MD       Or  . ondansetron [facility]) injection 4 mg  4 mg Intravenous Q6H PRN Georganna Skeans, MD      . oxyCODONE (Oxy IR/ROXICODONE) immediate release tablet 10 mg  10 mg Oral Q4H PRN Georganna Skeans, MD   10 mg at 12/14/18 0150  . oxyCODONE (Oxy IR/ROXICODONE) immediate release tablet 5 mg  5 mg Oral Q4H PRN Georganna Skeans, MD   5 mg at 12/15/18 1149  . pantoprazole (PROTONIX) EC tablet 40 mg  40 mg Oral Daily Georganna Skeans, MD   40 mg at 12/15/18 1100   Or  . pantoprazole (PROTONIX) injection 40 mg  40 mg Intravenous Daily Georganna Skeans, MD      . sodium chloride flush (NS) 0.9 % injection 3 mL  3 mL Intravenous Q12H Georganna Skeans, MD   3 mL at 12/15/18 1100  . sodium chloride flush (NS) 0.9 % injection 3 mL  3 mL Intravenous PRN Georganna Skeans, MD      . traMADol Veatrice Bourbon) tablet 50 mg  50 mg Oral Q6H PRN Georganna Skeans, MD         Discharge Medications: Please see discharge summary for a list of discharge medications.  Relevant Imaging Results:  Relevant Lab Results:   Additional Information [id number] [place], [place]

## 2018-12-15 NOTE — Progress Notes (Signed)
Pt placed on home CPAP by RN and RT.

## 2018-12-15 NOTE — Progress Notes (Signed)
Patient ID: Larry Huffman, male   DOB: 08-08-1923, 85 y.o.   MRN: [id number]    Subjective: No new complaints, reports R side is sore  Objective: Vital signs in last 24 hours: Temp:  [97.8 F (36.6 C)-99.7 F (37.6 C)] 98.7 F (37.1 C) (06/05 0754) Pulse Rate:  [68-92] 77 (06/05 0754) Resp:  [13-22] 14 (06/05 0754) BP: (120-159)/(39-107) 155/57 (06/05 0754) SpO2:  [94 %-99 %] 99 % (06/05 0754) Last BM Date: (PTA)  Intake/Output from previous day: 06/04 0701 - 06/05 0700 In: 406.7 [P.O.:240; I.V.:166.7] Out: 850 [Urine:850] Intake/Output this shift: No intake/output data recorded.  General appearance: cooperative Back: R flank hematoma and contusion evolving Resp: clear to auscultation bilaterally Chest wall: right sided chest wall tenderness Cardio: regular rate and rhythm GI: soft, NT  Lab Results: CBC  Recent Labs    12/14/18 1502 12/15/18 0537  WBC 16.1* 15.3*  HGB 7.2* 6.5*  HCT 22.1* 20.1*  PLT 166 158   BMET Recent Labs    12/14/18 0132 12/15/18 0537  NA 138 140  K 4.8 4.4  CL 108 112*  CO2 21* 20*  GLUCOSE 99 140*  BUN 38* 37*  CREATININE 2.02* 1.89*  CALCIUM 8.1* 8.0*   PT/INR No results for input(s): LABPROT, INR in the last 72 hours. ABG No results for input(s): PHART, HCO3 in the last 72 hours.  Invalid input(s): PCO2, PO2  Studies/Results: No results found.  Anti-infectives: Anti-infectives (From admission, onward)   None      Assessment/Plan: Fall down steps Scalp hematoma R rib FX 4,5 with no PTX- pulmonary toilet, multimodal pain control, PT/OT Large R flank and buttock hematoma with some muscular extravasationPlace abdominal binder. This should stop on its own. Hold ASA and Plavix.  ABL anemia - hgb down to 6.5, TF 1u PRBC AKI - creatinine 1.8, continue IVF. Patient has urethral valve he has to open to urinate. FEN - regular diet VTE - on hold for Hb drop, SCDs  Dispo - suspect he will need SNF   LOS: 3 days     Georganna Skeans, MD, MPH, FACS Trauma & General Surgery: [phone]  12/15/2018

## 2018-12-16 LAB — CBC
HCT: 21.1 % — ABNORMAL LOW (ref 39.0–52.0)
Hemoglobin: 7 g/dL — ABNORMAL LOW (ref 13.0–17.0)
MCH: 31.7 pg (ref 26.0–34.0)
MCHC: 33.2 g/dL (ref 30.0–36.0)
MCV: 95.5 fL (ref 80.0–100.0)
Platelets: 145 10*3/uL — ABNORMAL LOW (ref 150–400)
RBC: 2.21 MIL/uL — ABNORMAL LOW (ref 4.22–5.81)
RDW: 15.2 % (ref 11.5–15.5)
WBC: 13.1 10*3/uL — ABNORMAL HIGH (ref 4.0–10.5)
nRBC: 0 % (ref 0.0–0.2)

## 2018-12-16 LAB — BPAM RBC
Blood Product Expiration Date: 202006242359
ISSUE DATE / TIME: 202006051114
Unit Type and Rh: 5100

## 2018-12-16 LAB — TYPE AND SCREEN
ABO/RH(D): O POS
Antibody Screen: NEGATIVE
Unit division: 0

## 2018-12-16 LAB — HEMOGLOBIN AND HEMATOCRIT, BLOOD
HCT: 23.6 % — ABNORMAL LOW (ref 39.0–52.0)
Hemoglobin: 7.8 g/dL — ABNORMAL LOW (ref 13.0–17.0)

## 2018-12-16 MED ORDER — MORPHINE SULFATE (PF) 2 MG/ML IV SOLN
2.0000 mg | INTRAVENOUS | Status: DC | PRN
  Administered 2018-12-17: 2 mg via INTRAVENOUS
  Filled 2018-12-16: qty 1

## 2018-12-16 MED ORDER — METHOCARBAMOL 500 MG PO TABS
500.0000 mg | ORAL_TABLET | Freq: Three times a day (TID) | ORAL | Status: DC
  Administered 2018-12-16 – 2018-12-17 (×4): 500 mg via ORAL
  Filled 2018-12-16 (×4): qty 1

## 2018-12-16 MED ORDER — DOCUSATE SODIUM 100 MG PO CAPS
100.0000 mg | ORAL_CAPSULE | Freq: Every day | ORAL | Status: DC
  Administered 2018-12-16 – 2018-12-22 (×6): 100 mg via ORAL
  Filled 2018-12-16 (×6): qty 1

## 2018-12-16 MED ORDER — POLYETHYLENE GLYCOL 3350 17 G PO PACK
17.0000 g | PACK | Freq: Every day | ORAL | Status: DC
  Administered 2018-12-16 – 2018-12-22 (×4): 17 g via ORAL
  Filled 2018-12-16 (×4): qty 1

## 2018-12-16 MED ORDER — ACETAMINOPHEN 325 MG PO TABS
650.0000 mg | ORAL_TABLET | Freq: Four times a day (QID) | ORAL | Status: DC
  Administered 2018-12-16 – 2018-12-17 (×4): 650 mg via ORAL
  Filled 2018-12-16 (×4): qty 2

## 2018-12-16 MED ORDER — HALOPERIDOL LACTATE 5 MG/ML IJ SOLN
2.0000 mg | Freq: Four times a day (QID) | INTRAMUSCULAR | Status: DC | PRN
  Administered 2018-12-16 – 2018-12-17 (×4): 2 mg via INTRAVENOUS
  Filled 2018-12-16 (×4): qty 1

## 2018-12-16 NOTE — Progress Notes (Signed)
[facility]/Trauma Progress Note      Assessment/Plan Fall down steps Scalp hematoma R rib FX 4,5 with no PTX- pulmonary toilet, multimodal pain control, PT/OT Large R flank and buttock hematoma with some muscular extravasationPlace abdominal binder. This should stop on its own. Hold ASA and Plavix.  ABL anemia- hgb down to 6.5, TF 1u PRBC on 06/05, today 7.0, 1400 CBC AKI - creatinine 1.89, continue IVF. Patient has urethral valve he has to open to urinate. Nurse believes it is open & pt has a condom cath   FEN -regular diet VTE -on hold for Hb drop, SCDs ID: none Follow up: TBD  Dispo - will need SNF   LOS: 4 days    Subjective: CC: he wants to get up  He is confused this am. He is not sure where he is, what the year is or that he fell. Pt states he is having abdominal pain and rib pain. He keeps saying he wants to get up.   Objective: Vital signs in last 24 hours: Temp:  [97.7 F (36.5 C)-99.1 F (37.3 C)] 99.1 F (37.3 C) (06/06 0748) Pulse Rate:  [69-97] 71 (06/06 0400) Resp:  [11-19] 11 (06/06 0400) BP: (98-167)/(40-103) 135/70 (06/06 0400) SpO2:  [97 %-100 %] 97 % (06/06 0400) Last BM Date: (pta)  Intake/Output from previous day: 06/05 0701 - 06/06 0700 In: 2254.9 [P.O.:840; I.V.:1099.9; Blood:315] Out: 1350 [TDHRC:1638] Intake/Output this shift: No intake/output data recorded.  PE: Gen:  Alert, NAD Back: R flank hematoma and contusion with TTP Card:  RRR Chest wall: right sided chest wall tenderness, posey in place Pulm:  Decreased breath sounds R base, no W/R/R, rate and effort normal, was wearing CPAP this am Abd: Soft, ND, +BS, NT Skin: no rashes noted, warm and dry   Anti-infectives: Anti-infectives (From admission, onward)   None      Lab Results:  Recent Labs    12/15/18 0537 12/15/18 1838 12/16/18 0156  WBC 15.3*  --  13.1*  HGB 6.5* 7.8* 7.0*  HCT 20.1* 23.4* 21.1*  PLT 158  --  145*   BMET Recent Labs   12/14/18 0132 12/15/18 0537  NA 138 140  K 4.8 4.4  CL 108 112*  CO2 21* 20*  GLUCOSE 99 140*  BUN 38* 37*  CREATININE 2.02* 1.89*  CALCIUM 8.1* 8.0*   PT/INR No results for input(s): LABPROT, INR in the last 72 hours. CMP     Component Value Date/Time   NA 140 12/15/2018 0537   NA 134 (A) 01/27/2015   K 4.4 12/15/2018 0537   CL 112 (H) 12/15/2018 0537   CO2 20 (L) 12/15/2018 0537   GLUCOSE 140 (H) 12/15/2018 0537   BUN 37 (H) 12/15/2018 0537   BUN 16 01/27/2015   CREATININE 1.89 (H) 12/15/2018 0537   CALCIUM 8.0 (L) 12/15/2018 0537   PROT 6.3 (L) 12/12/2018 1751   ALBUMIN 3.0 (L) 12/12/2018 1751   AST 31 12/12/2018 1751   ALT 20 12/12/2018 1751   ALKPHOS 58 12/12/2018 1751   BILITOT 0.7 12/12/2018 1751   GFRNONAA 30 (L) 12/15/2018 0537   GFRAA 35 (L) 12/15/2018 0537   Lipase     Component Value Date/Time   LIPASE 18 04/06/2008 1426    Studies/Results: No results found.    Kalman Drape , [facility] Surgery 12/16/2018, 7:51 AM  Pager: [phone] Mon-Wed, Friday 7:00am-4:30pm Thurs 7am-11:30am  Consults: [phone]

## 2018-12-16 NOTE — Plan of Care (Signed)

## 2018-12-17 LAB — CBC
HCT: 25.2 % — ABNORMAL LOW (ref 39.0–52.0)
Hemoglobin: 8.2 g/dL — ABNORMAL LOW (ref 13.0–17.0)
MCH: 31.8 pg (ref 26.0–34.0)
MCHC: 32.5 g/dL (ref 30.0–36.0)
MCV: 97.7 fL (ref 80.0–100.0)
Platelets: 203 10*3/uL (ref 150–400)
RBC: 2.58 MIL/uL — ABNORMAL LOW (ref 4.22–5.81)
RDW: 14.9 % (ref 11.5–15.5)
WBC: 13.6 10*3/uL — ABNORMAL HIGH (ref 4.0–10.5)
nRBC: 0 % (ref 0.0–0.2)

## 2018-12-17 LAB — BASIC METABOLIC PANEL
Anion gap: 7 (ref 5–15)
BUN: 28 mg/dL — ABNORMAL HIGH (ref 8–23)
CO2: 19 mmol/L — ABNORMAL LOW (ref 22–32)
Calcium: 8.1 mg/dL — ABNORMAL LOW (ref 8.9–10.3)
Chloride: 116 mmol/L — ABNORMAL HIGH (ref 98–111)
Creatinine, Ser: 1.55 mg/dL — ABNORMAL HIGH (ref 0.61–1.24)
GFR calc Af Amer: 44 mL/min — ABNORMAL LOW (ref >60)
GFR calc non Af Amer: 38 mL/min — ABNORMAL LOW (ref >60)
Glucose, Bld: 106 mg/dL — ABNORMAL HIGH (ref 70–99)
Potassium: 4.2 mmol/L (ref 3.5–5.1)
Sodium: 142 mmol/L (ref 135–145)

## 2018-12-17 MED ORDER — HALOPERIDOL LACTATE 5 MG/ML IJ SOLN
5.0000 mg | Freq: Four times a day (QID) | INTRAMUSCULAR | Status: DC | PRN
  Administered 2018-12-17 – 2018-12-20 (×7): 5 mg via INTRAVENOUS
  Filled 2018-12-17 (×7): qty 1

## 2018-12-17 MED ORDER — ACETAMINOPHEN 325 MG PO TABS
650.0000 mg | ORAL_TABLET | Freq: Four times a day (QID) | ORAL | Status: DC | PRN
  Filled 2018-12-17: qty 2

## 2018-12-17 MED ORDER — METHOCARBAMOL 500 MG PO TABS
500.0000 mg | ORAL_TABLET | Freq: Four times a day (QID) | ORAL | Status: DC | PRN
  Administered 2018-12-18: 500 mg via ORAL
  Filled 2018-12-17 (×2): qty 1

## 2018-12-17 MED ORDER — DEXTROSE-NACL 5-0.45 % IV SOLN
INTRAVENOUS | Status: DC
  Administered 2018-12-17: 15:00:00 via INTRAVENOUS

## 2018-12-17 NOTE — TOC Initial Note (Signed)
Transition of Care [facility]) - Initial/Assessment Note    Patient Details  Name: Larry Huffman MRN: [id number] Date of Birth: September 12, 1923  Transition of Care [facility]) CM/SW Contact:    Oretha Milch, LCSW Phone Number: 12/17/2018, 4:28 PM  Clinical Narrative: CSW met with patient to discuss SNF placement. CSW noted patient appeared disorientated and stated "let me go home damn it." CSW spoke with patient's emergency contact and daughter [name] and noted she stated the family did not feel comfortable with a nursing rehabilitation placement due to worries about COVID. CSW noted per daughter he lives with her and she is working at home and is with him most of the day. CSW notes per her report patient has a sitter that has difficulties with reliability. CSW notes she stated she would be open to home health coming in. CSW notes at this time patient has not provided consent to follow up with SNF and at this point it seems patient and family refuse this as an option.    Expected Discharge Plan: [place] Barriers to Discharge: Continued Medical Work up, Other (comment)(SNF recommended but refused)   Patient Goals and CMS Choice Patient states their goals for this hospitalization and ongoing recovery are:: "I just want to go back home." CMS Medicare.gov Compare Post Acute Care list provided to:: Other (Comment Required)(Refused)    Expected Discharge Plan and Services Expected Discharge Plan: [place] Choice: Home Health                                        Prior Living Arrangements/Services     Patient language and need for interpreter reviewed:: No Do you feel safe going back to the place where you live?: Yes      Need for Family Participation in Patient Care: No (Comment) Care giver support system in place?: Yes (comment)   Criminal Activity/Legal Involvement Pertinent to Current Situation/Hospitalization: No -  Comment as needed  Activities of Daily Living      Permission Sought/Granted Permission sought to share information with : Family Supports, Chartered certified accountant granted to share information with : Yes, Verbal Permission Granted  Share Information with NAME: [name]   Permission granted to share info w AGENCY: For [facility] referral purposes  Permission granted to share info w Relationship: Daughter, referral     Emotional Assessment Appearance:: Appears stated age Attitude/Demeanor/Rapport: Angry, Apprehensive Affect (typically observed): Angry, Agitated, Frustrated Orientation: : (Unable to assess) Alcohol / Substance Use: Not Applicable Psych Involvement: No (comment)  Admission diagnosis:  Fall, initial encounter [W19.XXXA] Closed fracture of multiple ribs of right side, initial encounter [S22.41XA] Anemia, unspecified type [D64.9] Patient Active Problem List   Diagnosis Date Noted  . Right flank hematoma 12/12/2018  . Long-term (current) use of anticoagulants 02/21/2015  . Allergic rhinitis 01/24/2015  . Malnutrition of moderate degree ([place]) 01/16/2015  . Warfarin-induced coagulopathy ([place])   . Melena   . Severe anemia 01/15/2015  . GI bleed 01/15/2015  . Acute blood loss anemia 01/15/2015  . Chronic anticoagulation 06/04/2014  . Dementia ([place]) 04/19/2014  . Chronic diastolic heart failure ([place]) 04/15/2014  . Chronic kidney disease (CKD), stage III (moderate) ([place]) 04/15/2014  . Pulmonary embolism ([place]) 04/13/2014  . Spinal stenosis, lumbar region, with neurogenic claudication 02/08/2014    Class: Diagnosis of  .  Peripheral arterial disease: History of left carotid endarterectomy 03/15/2013  . Obstructive sleep apnea: Uses CPAP 03/15/2013  . Venous insufficiency 03/15/2013  . Essential hypertension 03/15/2013  . HLD (hyperlipidemia) 03/15/2013  . Chest pain 07/28/2012  . CAD - CFX DES 7/07, LAD DES 10/08, cath 11/11- medical Rx 07/28/2012    PCP:  Michel Harrow PA-C Pharmacy:   [facility] [address] [place] - [address] [place] [place] [address] Phone: [phone] Fax: [phone]     Social Determinants of Health (SDOH) Interventions    Readmission Risk Interventions No flowsheet data found.

## 2018-12-17 NOTE — Progress Notes (Addendum)
[facility]/Trauma Progress Note      Assessment/Plan Fall down steps Scalp hematoma R rib FX 4,5 with no PTX- pulmonary toilet, multimodal pain control, PT/OT Large R flank and buttock hematoma with some muscular extravasation- abdominal binder. This should stop on its own. Hold ASA and Plavix until Hgb stable.  ABL anemia- hgb down to6.5, TF 1u PRBC on 06/05, today 8.2, am CBC AKI- creatinine improving 1.55 today, continue IVF. Patient has urethral valve he has to open to urinate. Nurse believes it is open & pt has a condom cath, good UOP   FEN -regular diet VTE-on holduntil Hgb stable, SCDs ID: none Follow up: TBD  Dispo- will need SNF, am labs    LOS: 5 days    Subjective/chief complaint  Pt states "you're the one playing games with me". He does not know the year, who the president is or where he is. He states that he needs to get out of his gown so he can get dressed and go to work. He is not complaining of pain. Poor historian.   Objective: Vital signs in last 24 hours: Temp:  [97.7 F (36.5 C)-98.7 F (37.1 C)] 97.7 F (36.5 C) (06/07 0442) Pulse Rate:  [71-89] 78 (06/07 0800) Resp:  [12-22] 22 (06/07 0800) BP: (93-174)/(60-94) 93/70 (06/07 0800) SpO2:  [96 %-100 %] 100 % (06/07 0800) Last BM Date: (PTA)  Intake/Output from previous day: 06/06 0701 - 06/07 0700 In: 2394.8 [P.O.:600; I.V.:1794.8] Out: 1550 [ZOXWR:6045] Intake/Output this shift: No intake/output data recorded.  PE: Gen:  Alert, NAD Back:R flank hematoma and contusion with TTP Card:  RRR Chest wall:right sided chest wall tenderness, posey in place Pulm:  Decreased breath sounds R base with few crackles noted, no W/R/R, rate and effort normal, was wearing CPAP this am Abd: Soft, ND, +BS, NT Skin: no rashes noted, warm and dry    Anti-infectives: Anti-infectives (From admission, onward)   None      Lab Results:  Recent Labs    12/16/18 0156 12/16/18 1405  12/17/18 0747  WBC 13.1*  --  13.6*  HGB 7.0* 7.8* 8.2*  HCT 21.1* 23.6* 25.2*  PLT 145*  --  203   BMET Recent Labs    12/15/18 0537  NA 140  K 4.4  CL 112*  CO2 20*  GLUCOSE 140*  BUN 37*  CREATININE 1.89*  CALCIUM 8.0*   PT/INR No results for input(s): LABPROT, INR in the last 72 hours. CMP     Component Value Date/Time   NA 140 12/15/2018 0537   NA 134 (A) 01/27/2015   K 4.4 12/15/2018 0537   CL 112 (H) 12/15/2018 0537   CO2 20 (L) 12/15/2018 0537   GLUCOSE 140 (H) 12/15/2018 0537   BUN 37 (H) 12/15/2018 0537   BUN 16 01/27/2015   CREATININE 1.89 (H) 12/15/2018 0537   CALCIUM 8.0 (L) 12/15/2018 0537   PROT 6.3 (L) 12/12/2018 1751   ALBUMIN 3.0 (L) 12/12/2018 1751   AST 31 12/12/2018 1751   ALT 20 12/12/2018 1751   ALKPHOS 58 12/12/2018 1751   BILITOT 0.7 12/12/2018 1751   GFRNONAA 30 (L) 12/15/2018 0537   GFRAA 35 (L) 12/15/2018 0537   Lipase     Component Value Date/Time   LIPASE 18 04/06/2008 1426    Studies/Results: No results found.    Kalman Drape , [facility] 12/17/2018, 9:15 AM  Pager: [phone] Mon-Wed, Friday 7:00am-4:30pm Thurs 7am-11:30am  Consults: [phone]

## 2018-12-18 LAB — BASIC METABOLIC PANEL
Anion gap: 8 (ref 5–15)
BUN: 23 mg/dL (ref 8–23)
CO2: 19 mmol/L — ABNORMAL LOW (ref 22–32)
Calcium: 8.4 mg/dL — ABNORMAL LOW (ref 8.9–10.3)
Chloride: 115 mmol/L — ABNORMAL HIGH (ref 98–111)
Creatinine, Ser: 1.37 mg/dL — ABNORMAL HIGH (ref 0.61–1.24)
GFR calc Af Amer: 52 mL/min — ABNORMAL LOW (ref >60)
GFR calc non Af Amer: 44 mL/min — ABNORMAL LOW (ref >60)
Glucose, Bld: 136 mg/dL — ABNORMAL HIGH (ref 70–99)
Potassium: 4 mmol/L (ref 3.5–5.1)
Sodium: 142 mmol/L (ref 135–145)

## 2018-12-18 LAB — CBC
HCT: 24.1 % — ABNORMAL LOW (ref 39.0–52.0)
Hemoglobin: 8 g/dL — ABNORMAL LOW (ref 13.0–17.0)
MCH: 31.9 pg (ref 26.0–34.0)
MCHC: 33.2 g/dL (ref 30.0–36.0)
MCV: 96 fL (ref 80.0–100.0)
Platelets: 244 10*3/uL (ref 150–400)
RBC: 2.51 MIL/uL — ABNORMAL LOW (ref 4.22–5.81)
RDW: 14.5 % (ref 11.5–15.5)
WBC: 13 10*3/uL — ABNORMAL HIGH (ref 4.0–10.5)
nRBC: 0 % (ref 0.0–0.2)

## 2018-12-18 NOTE — Care Management (Signed)
Spoke with pt's daughter regarding discharge plans:  Per CSW on 12/17/18, daughter is refusing SNF, and prefers [facility] services.  Per PT notes, pt requires max assist of 2 to stand.  We discussed pt's need for 2+ assist with all mobility; daughter states she will not have 2 people available to help at all times, and she has a bad back.   She states he will have to be placed in SNF for rehab, as she cannot care for him at home.  Will check status of bed search; present bed offers to family when available.    Reinaldo Raddle, RN, BSN  Trauma/Neuro ICU Case Manager [phone]

## 2018-12-18 NOTE — Progress Notes (Signed)
Patient ID: Larry Huffman, male   DOB: July 29, 1923, 85 y.o.   MRN: [id number]    Subjective: Reports he is feeling a little better  Objective: Vital signs in last 24 hours: Temp:  [97.6 F (36.4 C)-99 F (37.2 C)] 97.9 F (36.6 C) (06/08 0739) Pulse Rate:  [58-86] 73 (06/08 0739) Resp:  [10-32] 32 (06/08 0739) BP: (130-190)/(53-72) 150/55 (06/08 0739) SpO2:  [95 %-98 %] 97 % (06/08 0739) Last BM Date: (PTA)  Intake/Output from previous day: 06/07 0701 - 06/08 0700 In: 1153.9 [P.O.:180; I.V.:973.9] Out: 2035 [Urine:2035] Intake/Output this shift: No intake/output data recorded.  General appearance: cooperative Back: R flank hematoma Resp: clear to auscultation bilaterally Cardio: regular rate and rhythm GI: soft, NT Extremities: calves soft  Lab Results: CBC  Recent Labs    12/17/18 0747 12/18/18 0952  WBC 13.6* 13.0*  HGB 8.2* 8.0*  HCT 25.2* 24.1*  PLT 203 244   BMET Recent Labs    12/17/18 0747  NA 142  K 4.2  CL 116*  CO2 19*  GLUCOSE 106*  BUN 28*  CREATININE 1.55*  CALCIUM 8.1*   PT/INR No results for input(s): LABPROT, INR in the last 72 hours. ABG No results for input(s): PHART, HCO3 in the last 72 hours.  Invalid input(s): PCO2, PO2  Studies/Results: No results found.  Anti-infectives: Anti-infectives (From admission, onward)   None      Assessment/Plan: Fall down steps Scalp hematoma R rib FX 4,5 with no PTX- pulmonary toilet, multimodal pain control, PT/OT Large R flank and buttock hematoma with some muscular extravasation- abdominal binder. This should stop on its own. Hold ASA and Plavix until Hgb stable.  ABL anemia- hgb now stable AKI- creatinine P today FEN -regular diet VTE-SCDs, Lovenox 6/9 ID: none Follow up: TBD  Dispo- plan SNF   LOS: 6 days    Georganna Skeans, MD, MPH, FACS Trauma & General Surgery: [phone]  12/18/2018

## 2018-12-18 NOTE — Progress Notes (Signed)
Physical Therapy Treatment Patient Details Name: Larry Huffman MRN: [id number] DOB: 05/31/1924 Today's Date: 12/18/2018    History of Present Illness Pt is a 85 y.o. M with significant PMH of dementia, CAD, PE, who presents after a fall down 8 steps with large R flank and buttock hematoma, scalp hematoma, and right rib fx 4,5.    PT Comments    Patient progressing well towards PT goals. Continues to be confused; "I am lost." Following simple 1-2 step commands today. Requires Max A of 2 to stand. Able to initiate gait training today with Min A for balance/safety and use of RW. "You better be ready to catch me if I go down." More alert and awake this session. Will continue to follow and progress as tolerated.   Follow Up Recommendations  SNF;Supervision/Assistance - 24 hour     Equipment Recommendations  Other (comment)(defer)    Recommendations for Other Services       Precautions / Restrictions Precautions Precautions: Fall Precaution Comments: bil mitts and posey belt Required Braces or Orthoses: Other Brace Other Brace: abdominal binder Restrictions Weight Bearing Restrictions: No    Mobility  Bed Mobility Overal bed mobility: Needs Assistance Bed Mobility: Rolling;Sidelying to Sit Rolling: Min assist Sidelying to sit: Max assist;HOB elevated       General bed mobility comments: Able to initiate movement with BLEs, but needs assist with trunk elevation and scooting bottom to EOB.   Transfers Overall transfer level: Needs assistance Equipment used: Rolling walker (2 wheeled) Transfers: Sit to/from Stand Sit to Stand: Max assist;+2 physical assistance         General transfer comment: Assist of 2 to power to standing with manual and verbal cues for hand placement/technique. Stood from [employer], from chair x2. "you better catch me If i start falling."  Ambulation/Gait Ambulation/Gait assistance: Min assist Gait Distance (Feet): 12 Feet(x2 bouts) Assistive device:  Rolling walker (2 wheeled) Gait Pattern/deviations: Step-through pattern;Trunk flexed;Decreased stride length;Wide base of support Gait velocity: decreased   General Gait Details: Slow, unsteady gait with RW; assist for balance. 1 seated rest break.   Stairs             Wheelchair Mobility    Modified Rankin (Stroke Patients Only)       Balance Overall balance assessment: Needs assistance Sitting-balance support: Feet supported;Bilateral upper extremity supported Sitting balance-Leahy Scale: Fair     Standing balance support: Bilateral upper extremity supported;During functional activity Standing balance-Leahy Scale: Poor Standing balance comment: reliant on external support and RW in standing.                            Cognition Arousal/Alertness: Awake/alert Behavior During Therapy: WFL for tasks assessed/performed Overall Cognitive Status: History of cognitive impairments - at baseline                                 General Comments: History of dementia at baseline. Daughter reports (per PA) that "some days are better than others" with memory. "I am not quite sure where they got me right now." "I am sort of homeless these dats."      Exercises      General Comments General comments (skin integrity, edema, etc.): VSS.      Pertinent Vitals/Pain Pain Assessment: Faces Faces Pain Scale: No hurt Pain Location: "I cannot really tell ya right now hunny"  Home Living                      Prior Function            PT Goals (current goals can now be found in the care plan section) Progress towards PT goals: Progressing toward goals    Frequency    Min 3X/week      PT Plan Current plan remains appropriate    Co-evaluation              AM-PAC PT "6 Clicks" Mobility   Outcome Measure  Help needed turning from your back to your side while in a flat bed without using bedrails?: A Little Help needed moving  from lying on your back to sitting on the side of a flat bed without using bedrails?: Total Help needed moving to and from a bed to a chair (including a wheelchair)?: A Lot Help needed standing up from a chair using your arms (e.g., wheelchair or bedside chair)?: A Lot Help needed to walk in hospital room?: A Lot Help needed climbing 3-5 steps with a railing? : Total 6 Click Score: 11    End of Session Equipment Utilized During Treatment: Gait belt Activity Tolerance: Patient tolerated treatment well Patient left: in chair;with call bell/phone within reach;with chair alarm set;Other (comment);with restraints reapplied(chair alarm waist belt) Nurse Communication: Mobility status PT Visit Diagnosis: Unsteadiness on feet (R26.81);Other abnormalities of gait and mobility (R26.89);Difficulty in walking, not elsewhere classified (R26.2)     Time: 2595-6387 PT Time Calculation (min) (ACUTE ONLY): 21 min  Charges:  $Gait Training: 8-22 mins                     Wray Kearns, PT, DPT Acute Rehabilitation Services Pager [phone] Office [place] 12/18/2018, 1:10 PM

## 2018-12-19 MED ORDER — BOOST / RESOURCE BREEZE PO LIQD CUSTOM
1.0000 | Freq: Two times a day (BID) | ORAL | Status: DC
  Administered 2018-12-19 – 2018-12-22 (×7): 1 via ORAL

## 2018-12-19 MED ORDER — ENOXAPARIN SODIUM 40 MG/0.4ML ~~LOC~~ SOLN
40.0000 mg | SUBCUTANEOUS | Status: DC
  Administered 2018-12-19 – 2018-12-22 (×4): 40 mg via SUBCUTANEOUS
  Filled 2018-12-19 (×4): qty 0.4

## 2018-12-19 MED ORDER — ADULT MULTIVITAMIN W/MINERALS CH
1.0000 | ORAL_TABLET | Freq: Every day | ORAL | Status: DC
  Administered 2018-12-20: 1 via ORAL
  Filled 2018-12-19: qty 1

## 2018-12-19 NOTE — Progress Notes (Signed)
Initial Nutrition Assessment  RD working remotely.  DOCUMENTATION CODES:   Not applicable  INTERVENTION:   -D/c Ensure Enlive, due to poor acceptance -Boost Breeze po BID, each supplement provides 250 kcal and 9 grams of protein -MVI with minerals daily -Consider initiation of bowel regimen; no documented BM since admission (miralax ordered 12/16/18, but pt refused this AM).   NUTRITION DIAGNOSIS:   Increased nutrient needs related to acute illness(rib fx, hematoma s/p fall) as evidenced by estimated needs.  GOAL:   Patient will meet greater than or equal to 90% of their needs  MONITOR:   PO intake, Supplement acceptance, Labs, Weight trends, Skin, I & O's  REASON FOR ASSESSMENT:   Low [place]    ASSESSMENT:   85yo M with an unwitnessed fall down approximately 8 steps. Unknown LOC. He has very poor short term memory. He was evaluated in the ED as a non-trauma code. He was found to have R rib FX X 2 and a large R flank and buttock hematoma. I was asked to admit him to the Trauma Service.  Pt admitted s/p fall down from steps (resulted in scalp hematoma, 4,5 rt rib fx with no PTA, and large rt flank and buttock hematoma).   Reviewed I/O's: -1.1 L x 24 hours and -565 ml since admission  UOP: 1.1 L x 24 hours  Per chart review, pt is confused, HOH, and a poor historian. Pt unable to provide further nutrition-related history at this time.   No recent wt hx available to assess acute changes (last wt documented on 10/14/2017- this was used to estimate needs). Per CareEverywhere, last documented wt was 154# at urology encounter on 10/08/18. Noted that pt had prior history of malnutrition in 01/2015 hospitalization, however, pt has gained weight since then and wt gain is favorable in the setting of advanced age and history of malnutrition.   Pt with improved appetite; noted meal completion 25-100%. Pt was ordered Ensure supplements on 12/14/18, however, pt is refusing.   Pt with no  documented BM since admission. Miralax was ordered on 12/16/18, however, pt refused this today.  Per MD notes, pt will require SNF placement once medically stable for discharge.   Labs reviewed.   NUTRITION - FOCUSED PHYSICAL EXAM:    Most Recent Value  Orbital Region  Unable to assess  Upper Arm Region  Unable to assess  Thoracic and Lumbar Region  Unable to assess  Buccal Region  Unable to assess  Temple Region  Unable to assess  Clavicle Bone Region  Unable to assess  Clavicle and Acromion Bone Region  Unable to assess  Scapular Bone Region  Unable to assess  Dorsal Hand  Unable to assess  Patellar Region  Unable to assess  Anterior Thigh Region  Unable to assess  Posterior Calf Region  Unable to assess  Edema (RD Assessment)  Unable to assess  Hair  Unable to assess  Eyes  Unable to assess  Mouth  Unable to assess  Skin  Unable to assess  Nails  Unable to assess       Diet Order:   Diet Order            Diet regular Room service appropriate? Yes with Assist; Fluid consistency: Thin  Diet effective now              EDUCATION NEEDS:   No education needs have been identified at this time  Skin:  Skin Assessment: Reviewed RN Assessment  Last BM:  Unknown  Height:   Ht Readings from Last 1 Encounters:  12/19/18 5\' 7"  (1.702 m)    Weight:   Wt Readings from Last 1 Encounters:  12/19/18 78.9 kg    Ideal Body Weight:  67.3 kg  BMI:  Body mass index is 27.24 kg/m.  Estimated Nutritional Needs:   Kcal:  1700-1900  Protein:  80-95 grams  Fluid:  > 1.7 L    Yaira Bernardi A. Jimmye Norman, RD, LDN, [place] Registered Dietitian II Certified Diabetes Care and Education Specialist Pager: [phone] After hours Pager: [phone]

## 2018-12-19 NOTE — Progress Notes (Signed)
Patient ID: Larry Huffman, male   DOB: [birth year], 85 y.o.   MRN: [id number]    Subjective: "I feel like an old man"  Objective: Vital signs in last 24 hours: Temp:  [97.5 F (36.4 C)-98.6 F (37 C)] 98.3 F (36.8 C) (06/09 0814) Pulse Rate:  [70-92] 75 (06/09 0816) Resp:  [17-22] 20 (06/09 0816) BP: (133-204)/(53-129) 204/85 (06/09 0816) SpO2:  [97 %-100 %] 99 % (06/09 0816) Last BM Date: (PTA)  Intake/Output from previous day: 06/08 0701 - 06/09 0700 In: -  Out: 1050 [Urine:1050] Intake/Output this shift: No intake/output data recorded.  General appearance: cooperative Back: R flank hematoma and tenderness Resp: clear to auscultation bilaterally Cardio: regular rate and rhythm GI: soft, NT Neurologic: Mental status: confused but F/C  Lab Results: CBC  Recent Labs    12/17/18 0747 12/18/18 0952  WBC 13.6* 13.0*  HGB 8.2* 8.0*  HCT 25.2* 24.1*  PLT 203 244   BMET Recent Labs    12/17/18 0747 12/18/18 0952  NA 142 142  K 4.2 4.0  CL 116* 115*  CO2 19* 19*  GLUCOSE 106* 136*  BUN 28* 23  CREATININE 1.55* 1.37*  CALCIUM 8.1* 8.4*   PT/INR No results for input(s): LABPROT, INR in the last 72 hours. ABG No results for input(s): PHART, HCO3 in the last 72 hours.  Invalid input(s): PCO2, PO2  Studies/Results: No results found.  Anti-infectives: Anti-infectives (From admission, onward)   None      Assessment/Plan: Fall down steps Scalp hematoma R rib FX 4,5 with no PTX- pulmonary toilet, multimodal pain control, PT/OT Large R flank and buttock hematoma with some muscular extravasation- abdominal binder. This should stop on its own. Hold ASA and Plavix  ABL anemia- hgb now stable AKI- creatinine down to 1.37 FEN -regular diet VTE-SCDs, start Lovenox today ID: none Follow up: TBD  Dispo- plan SNF   LOS: 7 days    Georganna Skeans, MD, MPH, FACS Trauma & General Surgery: [phone]  12/19/2018

## 2018-12-19 NOTE — Progress Notes (Signed)
No acute events overnight. Patient intermittently restless and confused. VSS.

## 2018-12-19 NOTE — Discharge Summary (Signed)
[facility] Discharge Summary   Patient ID: Larry Huffman MRN: [id number] DOB/AGE: [birth year] 85 y.o.  Admit date: 12/12/2018 Discharge date: 12/22/2018  Admitting Diagnosis: Fall down steps Scalp hematoma Right rib fracture 4,5 with no PTX  Large Right flank and buttock hematoma with some muscular extravasation  Discharge Diagnosis Patient Active Problem List   Diagnosis Date Noted  . Right flank hematoma 12/12/2018  . Long-term (current) use of anticoagulants 02/21/2015  . Allergic rhinitis 01/24/2015  . Malnutrition of moderate degree ([place]) 01/16/2015  . Warfarin-induced coagulopathy ([place])   . Melena   . Severe anemia 01/15/2015  . GI bleed 01/15/2015  . Acute blood loss anemia 01/15/2015  . Chronic anticoagulation 06/04/2014  . Dementia ([place]) 04/19/2014  . Chronic diastolic heart failure ([place]) 04/15/2014  . Chronic kidney disease (CKD), stage III (moderate) ([place]) 04/15/2014  . Pulmonary embolism ([place]) 04/13/2014  . Spinal stenosis, lumbar region, with neurogenic claudication 02/08/2014    Class: Diagnosis of  . Peripheral arterial disease: History of left carotid endarterectomy 03/15/2013  . Obstructive sleep apnea: Uses CPAP 03/15/2013  . Venous insufficiency 03/15/2013  . Essential hypertension 03/15/2013  . HLD (hyperlipidemia) 03/15/2013  . Chest pain 07/28/2012  . CAD - CFX DES 7/07, LAD DES 10/08, cath 11/11- medical Rx 07/28/2012    Consultants None  Imaging: No results found.  Procedures None  Hospital Course:  Larry Huffman is a 85yo male who presented to [facility] 6/2 with an unwitnessed fall down approximately 8 steps. Unknown LOC. He has very poor short term memory. He was evaluated in the ED as a non-trauma code. He was found to have R rib FX X 2 and a large R flank and buttock hematoma.  Patient was admitted to the trauma service. Aspirin and plavix held upon admission. Hemoglobin/hematocrit monitored and patient did require blood  transfusion on 6/5 for acute blood loss anemia. Hemoglobin/hematocrit remained stable after this. Patient also noted to have a rising creatinine. This improved with IVF hydration and when the patient opened his urethral valve to urinate.   Patient worked with therapies during this admission who recommended SNF when medically stable for discharge. On 12/22/18, the patient was voiding well, tolerating diet, working well with therapies, pain well controlled, vital signs stable and felt stable for discharge to SNF.  Patient will follow up as below and knows to call with questions or concerns.     Allergies as of 12/22/2018      Reactions   Ciprofloxacin Rash   Tape Other (See Comments)   Pulls skin off.  Please use "paper tape" only.   Dapsone Other (See Comments)   Lowers WBC count?? "made me very sick"      Medication List    TAKE these medications   acetaminophen 500 MG tablet Commonly known as: TYLENOL Take 1,000 mg by mouth 2 (two) times daily as needed for mild pain.   amLODipine 10 MG tablet Commonly known as: NORVASC Take 10 mg by mouth daily.   aspirin EC 81 MG tablet Take 81 mg by mouth at bedtime.   clopidogrel 75 MG tablet Commonly known as: PLAVIX Take 1 tablet (75 mg total) by mouth daily.   docusate sodium 100 MG capsule Commonly known as: COLACE Take 100 mg by mouth as needed for constipation.   doxepin 25 MG capsule Commonly known as: SINEQUAN Take 25 mg by mouth 2 (two) times a day.   ferrous sulfate 325 (65 FE) MG tablet Take 650 mg  by mouth daily with breakfast.   Fish Oil 1200 MG Caps Take 1,200 mg by mouth daily.   loratadine 10 MG tablet Commonly known as: CLARITIN Take 5 mg by mouth daily.   methocarbamol 500 MG tablet Commonly known as: ROBAXIN Take 1 tablet (500 mg total) by mouth every 6 (six) hours as needed for muscle spasms.   montelukast 10 MG tablet Commonly known as: SINGULAIR Take 10 mg by mouth at bedtime.   nitroGLYCERIN 0.4 MG  SL tablet Commonly known as: Nitrostat Place 1 tablet (0.4 mg total) under the tongue every 5 (five) minutes as needed for chest pain.   pantoprazole 40 MG tablet Commonly known as: PROTONIX Take 1 tablet (40 mg total) by mouth daily.   polyethylene glycol 17 g packet Commonly known as: MIRALAX / GLYCOLAX Take 17 g by mouth as needed (constipation).   PRESERVISION AREDS 2 PO Take 1 tablet by mouth daily.   PX COMPLETE SENIOR MULTIVITS PO Take 1 tablet by mouth every morning.   PROBIOTIC DAILY PO Take 1 tablet by mouth every morning.   pyridOXINE 100 MG tablet Commonly known as: VITAMIN B-6 Take 100 mg by mouth daily.   simvastatin 10 MG tablet Commonly known as: ZOCOR Take 1 tablet (10 mg total) by mouth at bedtime.   triamcinolone cream 0.1 % Commonly known as: KENALOG Apply 1 application topically 2 (two) times daily as needed for irritation.   vitamin C 1000 MG tablet Take 1,000 mg by mouth daily.        Follow-up Information    Lawerance Cruel, MD. Call.   Specialty: Family Medicine Why: Call and schedule an appointment to be seen in 1-2 weeks for follow up of rib fractures and flank/buttock hematoma.  Contact information: [place] [id number] [phone]           Signed: Brigid Re, [facility] Surgery 12/22/2018, 1:45 PM Pager: [phone] Mon-Thurs 7:00 am-4:30 pm Fri 7:00 am -11:30 AM Sat-Sun 7:00 am-11:30 am

## 2018-12-20 MED ORDER — TAB-A-VITE/IRON PO TABS
1.0000 | ORAL_TABLET | Freq: Every day | ORAL | Status: DC
  Administered 2018-12-21 – 2018-12-22 (×2): 1 via ORAL
  Filled 2018-12-20 (×2): qty 1

## 2018-12-20 MED ORDER — MAGNESIUM CITRATE PO SOLN
1.0000 | Freq: Once | ORAL | Status: DC
  Filled 2018-12-20: qty 296

## 2018-12-20 MED ORDER — HYDRALAZINE HCL 20 MG/ML IJ SOLN: 5.0000 mg | Freq: Four times a day (QID) | INTRAMUSCULAR | Status: DC | PRN

## 2018-12-20 NOTE — Progress Notes (Signed)
Occupational Therapy Treatment Patient Details Name: Larry Huffman MRN: [id number] DOB: 1924/07/08 Today's Date: 12/20/2018    History of present illness Pt is a 85 y.o. M with significant PMH of dementia, CAD, PE, who presents after a fall down 8 steps with large R flank and buttock hematoma, scalp hematoma, and right rib fx 4,5.   OT comments  Pt progressing towards OT goals this session. Pt pleasantly confused throughout session, following simple one step commands, requires cues for initiation, sequencing, and safety. Able to perform bed mobility at mod  A+2, pivot to [facility] at +2 assist with RW. Sitting on the [facility] he performed multiple grooming tasks with mod A (assist for cues not physical assist) able to stand with mod A and then 2nd therapist assisted by performing peri care as Pt is dependent in BUE for standing balance. Then short ambulation at mod A +2 safety to recliner. Pt continues to require skilled OT in the acute setting and SNF post-acute. OT will continue to follow.   Follow Up Recommendations  SNF;Supervision/Assistance - 24 hour    Equipment Recommendations  Other (comment)(defer to next venue)    Recommendations for Other Services Other (comment)(Palliative Medicine or conversation about code status)    Precautions / Restrictions Precautions Precautions: Fall Required Braces or Orthoses: Other Brace Other Brace: abdominal binder Restrictions Weight Bearing Restrictions: No       Mobility Bed Mobility Overal bed mobility: Needs Assistance Bed Mobility: Rolling;Sidelying to Sit Rolling: Min assist Sidelying to sit: Mod assist;+2 for physical assistance;+2 for safety/equipment       General bed mobility comments: Able to initiate movement with BLEs, but needs assist with trunk elevation and scooting bottom to EOB.   Transfers Overall transfer level: Needs assistance Equipment used: Rolling walker (2 wheeled) Transfers: Sit to/from [employer] Sit to Stand: Mod assist;+2 physical assistance;+2 safety/equipment;From elevated surface Stand pivot transfers: Mod assist;+2 physical assistance       General transfer comment: assist for power up and balance, cues to initiate    Balance Overall balance assessment: Needs assistance Sitting-balance support: Feet supported;Bilateral upper extremity supported Sitting balance-Leahy Scale: Fair Sitting balance - Comments: progressed from mod A to min guard   Standing balance support: Bilateral upper extremity supported;During functional activity Standing balance-Leahy Scale: Poor Standing balance comment: reliant on external support and RW in standing.                           ADL either performed or assessed with clinical judgement   ADL Overall ADL's : Needs assistance/impaired     Grooming: Wash/dry hands;Wash/dry face;Oral care;Min guard;Sitting Grooming Details (indicate cue type and reason): on BSC during BM             Lower Body Dressing: Maximal assistance;Bed level Lower Body Dressing Details (indicate cue type and reason): to don socks Toilet Transfer: Moderate assistance;+2 for physical assistance;+2 for safety/equipment;Stand-pivot;BSC;RW Toilet Transfer Details (indicate cue type and reason): cues for safety/sequencing, PT unaware having BM in bed Toileting- Clothing Manipulation and Hygiene: Maximal assistance;+2 for physical assistance;+2 for safety/equipment;Sit to/from stand Toileting - Clothing Manipulation Details (indicate cue type and reason): Pt able to stand with one person and maintain standing for 2nd person to perform peri care in standing     Functional mobility during ADLs: Minimal assistance;+2 for safety/equipment;Cueing for safety;Rolling walker General ADL Comments: very HOH, but able to follow simple one step commands today  Vision       Perception     Praxis      Cognition Arousal/Alertness:  Awake/alert Behavior During Therapy: WFL for tasks assessed/performed Overall Cognitive Status: History of cognitive impairments - at baseline                                 General Comments: history of dementia, Pt pleasantly confused but cooperative throughout session today        Exercises     Shoulder Instructions       General Comments VSS throughout session    Pertinent Vitals/ Pain       Pain Assessment: Faces Faces Pain Scale: No hurt Pain Intervention(s): Monitored during session  Home Living                                          Prior Functioning/Environment              Frequency  Min 2X/week        Progress Toward Goals  OT Goals(current goals can now be found in the care plan section)  Progress towards OT goals: Progressing toward goals  Acute Rehab OT Goals Patient Stated Goal: get comfy and take a nap OT Goal Formulation: With patient Time For Goal Achievement: 12/28/18 Potential to Achieve Goals: [place] Discharge plan remains appropriate;Frequency remains appropriate    Co-evaluation    PT/OT/SLP Co-Evaluation/Treatment: Yes Reason for Co-Treatment: Necessary to address cognition/behavior during functional activity;For patient/therapist safety;To address functional/ADL transfers PT goals addressed during session: Mobility/safety with mobility;Balance;Proper use of DME OT goals addressed during session: ADL's and self-care;Proper use of Adaptive equipment and DME      AM-PAC OT "6 Clicks" Daily Activity     Outcome Measure   Help from another person eating meals?: A Little Help from another person taking care of personal grooming?: A Lot Help from another person toileting, which includes using toliet, bedpan, or urinal?: A Lot Help from another person bathing (including washing, rinsing, drying)?: A Lot Help from another person to put on and taking off regular upper body clothing?: A Lot Help  from another person to put on and taking off regular lower body clothing?: A Lot 6 Click Score: 13    End of Session Equipment Utilized During Treatment: Gait belt;Rolling walker  OT Visit Diagnosis: Unsteadiness on feet (R26.81);Muscle weakness (generalized) (M62.81);Other symptoms and signs involving cognitive function;Adult, failure to thrive (R62.7)   Activity Tolerance Patient tolerated treatment well   Patient Left in chair;with call bell/phone within reach;with chair alarm set   Nurse Communication Mobility status;Other (comment)(had BM)        Time: 0093-8182 OT Time Calculation (min): 32 min  Charges: OT General Charges $OT Visit: 1 Visit OT Treatments $Self Care/Home Management : 8-22 mins  Hulda Humphrey OTR/L Acute Rehabilitation Services Pager: [phone] Office: [place] 12/20/2018, 4:33 PM

## 2018-12-20 NOTE — Progress Notes (Signed)
Physical Therapy Treatment Patient Details Name: Larry Huffman MRN: [id number] DOB: 17-Jan-1924 Today's Date: 12/20/2018    History of Present Illness Pt is a 85 y.o. M with significant PMH of dementia, CAD, PE, who presents after a fall down 8 steps with large R flank and buttock hematoma, scalp hematoma, and right rib fx 4,5.    PT Comments    Pt making slow and steady progress towards physical therapy goals. Demonstrates seemingly improved pain control. Requiring two person moderate assistance for transfers and ambulating 3 feet with walker. Pt with bowel incontinence in bed and assisted on BSC. Requiring totalA for peri care. Continue to recommend SNF for ongoing Physical Therapy.      Follow Up Recommendations  SNF;Supervision/Assistance - 24 hour     Equipment Recommendations  Rolling walker with 5" wheels    Recommendations for Other Services       Precautions / Restrictions Precautions Precautions: Fall Required Braces or Orthoses: Other Brace Other Brace: abdominal binder Restrictions Weight Bearing Restrictions: No    Mobility  Bed Mobility Overal bed mobility: Needs Assistance Bed Mobility: Rolling;Sidelying to Sit Rolling: Min assist Sidelying to sit: Mod assist;+2 for physical assistance;+2 for safety/equipment       General bed mobility comments: Able to initiate movement with BLEs, but needs assist with trunk elevation and scooting bottom to EOB.   Transfers Overall transfer level: Needs assistance Equipment used: Rolling walker (2 wheeled) Transfers: Sit to/from [employer] Sit to Stand: Mod assist;+2 physical assistance;+2 safety/equipment;From elevated surface Stand pivot transfers: Mod assist;+2 physical assistance       General transfer comment: assist for power up and balance, cues to initiate  Ambulation/Gait Ambulation/Gait assistance: Min assist;+2 safety/equipment Gait Distance (Feet): 3 Feet Assistive device: Rolling  walker (2 wheeled) Gait Pattern/deviations: Step-through pattern;Trunk flexed;Decreased stride length;Wide base of support Gait velocity: decreased Gait velocity interpretation: <1.8 ft/sec, indicate of risk for recurrent falls General Gait Details: Slow, unsteady gait with walker, min assist for balance. trunk flexed posture   Stairs             Wheelchair Mobility    Modified Rankin (Stroke Patients Only)       Balance Overall balance assessment: Needs assistance Sitting-balance support: Feet supported;Bilateral upper extremity supported Sitting balance-Leahy Scale: Fair Sitting balance - Comments: progressed from mod A to min guard   Standing balance support: Bilateral upper extremity supported;During functional activity Standing balance-Leahy Scale: Poor Standing balance comment: reliant on external support and RW in standing.                            Cognition Arousal/Alertness: Awake/alert Behavior During Therapy: WFL for tasks assessed/performed Overall Cognitive Status: History of cognitive impairments - at baseline                                 General Comments: history of dementia, Pt pleasantly confused but cooperative throughout session today      Exercises      General Comments General comments (skin integrity, edema, etc.): VSS throughout session      Pertinent Vitals/Pain Pain Assessment: Faces Faces Pain Scale: No hurt Pain Intervention(s): Monitored during session    Home Living                      Prior Function  PT Goals (current goals can now be found in the care plan section) Acute Rehab PT Goals Patient Stated Goal: get comfy and take a nap Potential to Achieve Goals: Fair Progress towards PT goals: Progressing toward goals    Frequency    Min 3X/week      PT Plan Current plan remains appropriate    Co-evaluation PT/OT/SLP Co-Evaluation/Treatment: Yes Reason for  Co-Treatment: Complexity of the patient's impairments (multi-system involvement);Necessary to address cognition/behavior during functional activity;For patient/therapist safety;To address functional/ADL transfers PT goals addressed during session: Mobility/safety with mobility OT goals addressed during session: ADL's and self-care;Proper use of Adaptive equipment and DME      AM-PAC PT "6 Clicks" Mobility   Outcome Measure  Help needed turning from your back to your side while in a flat bed without using bedrails?: A Little Help needed moving from lying on your back to sitting on the side of a flat bed without using bedrails?: A Lot Help needed moving to and from a bed to a chair (including a wheelchair)?: A Little Help needed standing up from a chair using your arms (e.g., wheelchair or bedside chair)?: A Lot Help needed to walk in hospital room?: A Little Help needed climbing 3-5 steps with a railing? : Total 6 Click Score: 14    End of Session Equipment Utilized During Treatment: Gait belt Activity Tolerance: Patient tolerated treatment well Patient left: in chair;with call bell/phone within reach;with chair alarm set Nurse Communication: Mobility status PT Visit Diagnosis: Unsteadiness on feet (R26.81);Other abnormalities of gait and mobility (R26.89);Difficulty in walking, not elsewhere classified (R26.2) Pain - Right/Left: Right Pain - part of body: Hip     Time: 3568-6168 PT Time Calculation (min) (ACUTE ONLY): 30 min  Charges:  $Therapeutic Activity: 8-22 mins                     Ellamae Sia, PT, DPT Acute Rehabilitation Services Pager [phone] Office [phone]    Willy Eddy 12/20/2018, 5:20 PM

## 2018-12-20 NOTE — Progress Notes (Signed)
CSW spoke with patients daughter, Helene Kelp, she is agreeable to [facility] SNF at this time. Facility has started insurance authorization however patient remains in restraints and is unable to go to facility until 24 hours restraint free.   Will continue to update.   Kingsley Spittle, LCSW [facility] [place]  [phone]

## 2018-12-20 NOTE — Progress Notes (Signed)
  [facility] Progress Note     Subjective: CC-  Sitting up eating breakfast. No complaints. Tolerating diet. Denies n/v or abdominal pain. No BM since admission.  Objective: Vital signs in last 24 hours: Temp:  [98 F (36.7 C)-98.7 F (37.1 C)] 98.1 F (36.7 C) (06/10 0816) Pulse Rate:  [73-91] 73 (06/10 0816) Resp:  [18-21] 21 (06/10 0816) BP: (154-180)/(50-68) 154/50 (06/10 0816) SpO2:  [98 %] 98 % (06/10 0816) Weight:  [78.9 kg] 78.9 kg (06/09 1020) Last BM Date: 12/19/18  Intake/Output from previous day: 06/09 0701 - 06/10 0700 In: 180 [P.O.:180] Out: 1300 [Urine:1300] Intake/Output this shift: No intake/output data recorded.  PE: Gen:  Alert, NAD, pleasant HEENT: EOM's intact, pupils equal and round Card:  RRR Pulm:  CTAB, no W/R/R, effort normal Abd: Soft, NT/ND, abdominal binder in place Ext:  Calves soft and nontender without edema Neuro: moving all 4 extremities Skin: no rashes noted, warm and dry  Lab Results:  Recent Labs    12/18/18 0952  WBC 13.0*  HGB 8.0*  HCT 24.1*  PLT 244   BMET Recent Labs    12/18/18 0952  NA 142  K 4.0  CL 115*  CO2 19*  GLUCOSE 136*  BUN 23  CREATININE 1.37*  CALCIUM 8.4*   PT/INR No results for input(s): LABPROT, INR in the last 72 hours. CMP     Component Value Date/Time   NA 142 12/18/2018 0952   NA 134 (A) 01/27/2015   K 4.0 12/18/2018 0952   CL 115 (H) 12/18/2018 0952   CO2 19 (L) 12/18/2018 0952   GLUCOSE 136 (H) 12/18/2018 0952   BUN 23 12/18/2018 0952   BUN 16 01/27/2015   CREATININE 1.37 (H) 12/18/2018 0952   CALCIUM 8.4 (L) 12/18/2018 0952   PROT 6.3 (L) 12/12/2018 1751   ALBUMIN 3.0 (L) 12/12/2018 1751   AST 31 12/12/2018 1751   ALT 20 12/12/2018 1751   ALKPHOS 58 12/12/2018 1751   BILITOT 0.7 12/12/2018 1751   GFRNONAA 44 (L) 12/18/2018 0952   GFRAA 52 (L) 12/18/2018 0952   Lipase     Component Value Date/Time   LIPASE 18 04/06/2008 1426        Studies/Results: No results found.  Anti-infectives: Anti-infectives (From admission, onward)   None       Assessment/Plan Fall down steps Scalp hematoma R rib FX 4,5 with no PTX- pulmonary toilet, multimodal pain control, PT/OT Large R flank and buttock hematoma with some muscular extravasation- abdominal binder. This should stop on its own. Hold ASA and Plavix  ABL anemia- hgb now stable AKI- creatinine down to 1.37 (6/9), good UOP Elevated BP - continue home norvasc, add IV hydralazine PRN FEN -regular diet VTE-SCDs, Lovenox  ID: none Follow up:TBD  Dispo- Mag citrate for constipation. Medically stable for d/c to SNF.   LOS: 8 days    Wellington Hampshire , [facility] 12/20/2018, 10:06 AM Pager: [phone] Mon-Thurs 7:00 am-4:30 pm Fri 7:00 am -11:30 AM Sat-Sun 7:00 am-11:30 am

## 2018-12-21 LAB — CBC
HCT: 24.2 % — ABNORMAL LOW (ref 39.0–52.0)
Hemoglobin: 7.9 g/dL — ABNORMAL LOW (ref 13.0–17.0)
MCH: 31.3 pg (ref 26.0–34.0)
MCHC: 32.6 g/dL (ref 30.0–36.0)
MCV: 96 fL (ref 80.0–100.0)
Platelets: 299 10*3/uL (ref 150–400)
RBC: 2.52 MIL/uL — ABNORMAL LOW (ref 4.22–5.81)
RDW: 14 % (ref 11.5–15.5)
WBC: 10.2 10*3/uL (ref 4.0–10.5)
nRBC: 0 % (ref 0.0–0.2)

## 2018-12-21 NOTE — Progress Notes (Signed)
PIV consult: Last IV infiltrated, no IV meds ordered at this time. Discussed with Varney Biles, RN: Nurse will place stat consult if IV meds are needed.

## 2018-12-21 NOTE — Care Management Important Message (Signed)
Important Message  Patient Details  Name: Larry Huffman MRN: [id number] Date of Birth: 21-Feb-1924   Medicare Important Message Given:  Yes    Memory Argue 12/21/2018, 1:33 PM

## 2018-12-21 NOTE — Progress Notes (Signed)
Patient has been off restraint since 0800 on 12/20/18. Restraint order discontinued.

## 2018-12-21 NOTE — Progress Notes (Signed)
Patient ID: Larry Huffman, male   DOB: 20-Jan-1924, 85 y.o.   MRN: [id number]    Subjective: Up in chair, no new complaint  Objective: Vital signs in last 24 hours: Temp:  [98 F (36.7 C)-98.4 F (36.9 C)] 98 F (36.7 C) (06/11 0723) Pulse Rate:  [64-76] 76 (06/11 0723) Resp:  [15-24] 15 (06/11 0723) BP: (147-176)/(48-81) 176/62 (06/11 0723) SpO2:  [96 %-99 %] 96 % (06/11 0723) Last BM Date: 12/19/18  Intake/Output from previous day: 06/10 0701 - 06/11 0700 In: 840 [P.O.:840] Out: 700 [Urine:700] Intake/Output this shift: Total I/O In: 120 [P.O.:120] Out: -   General appearance: cooperative Back: R flank hematoma\ Resp: clear to auscultation bilaterally Cardio: regular rate and rhythm GI: soft, NT Neurologic: Mental status: alert and F/C, disoriented to place  Lab Results: CBC  Recent Labs    12/18/18 0952 12/21/18 0444  WBC 13.0* 10.2  HGB 8.0* 7.9*  HCT 24.1* 24.2*  PLT 244 299   BMET Recent Labs    12/18/18 0952  NA 142  K 4.0  CL 115*  CO2 19*  GLUCOSE 136*  BUN 23  CREATININE 1.37*  CALCIUM 8.4*   PT/INR No results for input(s): LABPROT, INR in the last 72 hours. ABG No results for input(s): PHART, HCO3 in the last 72 hours.  Invalid input(s): PCO2, PO2  Studies/Results: No results found.  Anti-infectives: Anti-infectives (From admission, onward)   None      Assessment/Plan: Fall down steps Scalp hematoma R rib FX 4,5 with no PTX- pulmonary toilet, multimodal pain control, PT/OT Large R flank and buttock hematoma with some muscular extravasation- abdominal binder. This should stop on its own. Hold ASA and Plavix  ABL anemia- hgb now stable AKI- creatinine down to 1.37 (6/9), good UOP Elevated BP - continue home norvasc, IV hydralazine PRN FEN -regular diet, BM X 2 after Mg citrate VTE-SCDs, Lovenox  ID: none Follow up:TBD  Dispo- Insurance auth for SNF pending    LOS: 9 days    Georganna Skeans, MD, MPH, FACS Trauma  & General Surgery: [phone]  12/21/2018

## 2018-12-21 NOTE — Progress Notes (Signed)
Assisted pt. With placing on home cpap. Pt. Tolerating well at this time.

## 2018-12-22 DIAGNOSIS — D649 Anemia, unspecified: Secondary | ICD-10-CM | POA: Diagnosis not present

## 2018-12-22 DIAGNOSIS — R531 Weakness: Secondary | ICD-10-CM | POA: Diagnosis not present

## 2018-12-22 DIAGNOSIS — E785 Hyperlipidemia, unspecified: Secondary | ICD-10-CM | POA: Diagnosis not present

## 2018-12-22 DIAGNOSIS — S2241XD Multiple fractures of ribs, right side, subsequent encounter for fracture with routine healing: Secondary | ICD-10-CM | POA: Diagnosis not present

## 2018-12-22 DIAGNOSIS — Y92009 Unspecified place in unspecified non-institutional (private) residence as the place of occurrence of the external cause: Secondary | ICD-10-CM | POA: Diagnosis not present

## 2018-12-22 DIAGNOSIS — F0390 Unspecified dementia without behavioral disturbance: Secondary | ICD-10-CM | POA: Diagnosis not present

## 2018-12-22 DIAGNOSIS — W109XXA Fall (on) (from) unspecified stairs and steps, initial encounter: Secondary | ICD-10-CM | POA: Diagnosis not present

## 2018-12-22 DIAGNOSIS — Z7401 Bed confinement status: Secondary | ICD-10-CM | POA: Diagnosis not present

## 2018-12-22 DIAGNOSIS — M6281 Muscle weakness (generalized): Secondary | ICD-10-CM | POA: Diagnosis not present

## 2018-12-22 DIAGNOSIS — M2550 Pain in unspecified joint: Secondary | ICD-10-CM | POA: Diagnosis not present

## 2018-12-22 DIAGNOSIS — I2510 Atherosclerotic heart disease of native coronary artery without angina pectoris: Secondary | ICD-10-CM | POA: Diagnosis not present

## 2018-12-22 DIAGNOSIS — I959 Hypotension, unspecified: Secondary | ICD-10-CM | POA: Diagnosis not present

## 2018-12-22 DIAGNOSIS — S2241XA Multiple fractures of ribs, right side, initial encounter for closed fracture: Secondary | ICD-10-CM | POA: Diagnosis not present

## 2018-12-22 DIAGNOSIS — W19XXXD Unspecified fall, subsequent encounter: Secondary | ICD-10-CM | POA: Diagnosis not present

## 2018-12-22 DIAGNOSIS — I739 Peripheral vascular disease, unspecified: Secondary | ICD-10-CM | POA: Diagnosis not present

## 2018-12-22 DIAGNOSIS — G4733 Obstructive sleep apnea (adult) (pediatric): Secondary | ICD-10-CM | POA: Diagnosis not present

## 2018-12-22 DIAGNOSIS — R41841 Cognitive communication deficit: Secondary | ICD-10-CM | POA: Diagnosis not present

## 2018-12-22 DIAGNOSIS — F0391 Unspecified dementia with behavioral disturbance: Secondary | ICD-10-CM | POA: Diagnosis not present

## 2018-12-22 DIAGNOSIS — Z23 Encounter for immunization: Secondary | ICD-10-CM | POA: Diagnosis not present

## 2018-12-22 DIAGNOSIS — N183 Chronic kidney disease, stage 3 (moderate): Secondary | ICD-10-CM | POA: Diagnosis not present

## 2018-12-22 DIAGNOSIS — I10 Essential (primary) hypertension: Secondary | ICD-10-CM | POA: Diagnosis not present

## 2018-12-22 LAB — NOVEL CORONAVIRUS, NAA (HOSP ORDER, SEND-OUT TO REF LAB; TAT 18-24 HRS): SARS-CoV-2, NAA: NOT DETECTED

## 2018-12-22 MED ORDER — METHOCARBAMOL 500 MG PO TABS: 500.0000 mg | ORAL_TABLET | Freq: Four times a day (QID) | ORAL | Status: DC | PRN

## 2018-12-22 NOTE — TOC Transition Note (Signed)
Transition of Care [facility]) - CM/SW Discharge Note   Patient Details  Name: Larry Huffman MRN: [id number] Date of Birth: 1923-10-06  Transition of Care [facility]) CM/SW Contact:  Weston Anna, LCSW Phone Number: 12/22/2018, 1:43 PM   Clinical Narrative:    Patient set to discharge to [facility] SNF today- received negative covid test 6/12. CSW left voicemail for spouse, Helene Kelp, to notify of discharge- will call back later on if needed. Please call report to [phone]. Patient will be going to room 101B.   Patient will be going via PTAR once discharge summary is completed.    Final next level of care: Skilled Nursing Facility Barriers to Discharge: No Barriers Identified   Patient Goals and CMS Choice Patient states their goals for this hospitalization and ongoing recovery are:: Getting back home CMS Medicare.gov Compare Post Acute Care list provided to:: Patient Represenative (must comment)(spouse- teresa) Choice offered to / list presented to : Spouse  Discharge Placement              Patient [facility] bed at: [facility] Patient to be transferred to facility by: [place] Name of family member notified: Spouse-Teresa Patient and family notified of of transfer: 12/22/18  Discharge Plan and Services     Post Acute Care Choice: Home Health                               Social Determinants of Health (SDOH) Interventions     Readmission Risk Interventions No flowsheet data found.

## 2018-12-22 NOTE — Progress Notes (Signed)
Patient ID: Larry Huffman, male   DOB: 04-05-1924, 85 y.o.   MRN: [id number]    Subjective: Up in chair, just took a shower  Objective: Vital signs in last 24 hours: Temp:  [98.5 F (36.9 C)-99.1 F (37.3 C)] 98.5 F (36.9 C) (06/12 0400) Pulse Rate:  [30-60] 56 (06/12 0732) Resp:  [13-17] 13 (06/12 0732) BP: (127-153)/(46-55) 152/54 (06/12 0732) SpO2:  [98 %-100 %] 100 % (06/12 0732) Last BM Date: 12/21/18  Intake/Output from previous day: 06/11 0701 - 06/12 0700 In: 720 [P.O.:720] Out: 150 [Urine:150] Intake/Output this shift: Total I/O In: -  Out: 200 [Urine:200]  General appearance: alert and cooperative Back: R flank hematoma Resp: clear to auscultation bilaterally Cardio: regular rate and rhythm GI: soft, NT Neurologic: Mental status: HOH, F/C well  Lab Results: CBC  Recent Labs    12/21/18 0444  WBC 10.2  HGB 7.9*  HCT 24.2*  PLT 299   BMET No results for input(s): NA, K, CL, CO2, GLUCOSE, BUN, CREATININE, CALCIUM in the last 72 hours. PT/INR No results for input(s): LABPROT, INR in the last 72 hours. ABG No results for input(s): PHART, HCO3 in the last 72 hours.  Invalid input(s): PCO2, PO2  Studies/Results: No results found.  Anti-infectives: Anti-infectives (From admission, onward)   None      Assessment/Plan: Fall down steps Scalp hematoma R rib FX 4,5 with no PTX- pulmonary toilet, multimodal pain control, PT/OT Large R flank and buttock hematoma with some muscular extravasation ABL anemia- hgb now stable AKI- creatinine down to 1.37 (6/9), good UOP Elevated BP - continue home norvasc, IV hydralazine PRN FEN -regular diet, BM X 2 after Mg citrate VTE-SCDs, Lovenox  ID: none Follow up:TBD  Dispo- SNF once updated COVID test is back   LOS: 10 days    Georganna Skeans, MD, MPH, FACS Trauma & General Surgery: [phone]  12/22/2018

## 2018-12-22 NOTE — Discharge Instructions (Signed)
Rib Fracture  A rib fracture is a break or crack in one of the bones of the ribs. The ribs are like a cage that goes around your upper chest. A broken or cracked rib is often painful, but most do not cause other problems. Most rib fractures usually heal on their own in 1-3 months. Follow these instructions at home: Managing pain, stiffness, and swelling  If directed, apply ice to the injured area. ? Put ice in a plastic bag. ? Place a towel between your skin and the bag. ? Leave the ice on for 20 minutes, 2-3 times a day.  Take over-the-counter and prescription medicines only as told by your doctor. Activity  Avoid activities that cause pain to the injured area. Protect your injured area.  Slowly increase activity as told by your doctor. General instructions  Do deep breathing as told by your doctor. You may be told to: ? Take deep breaths many times a day. ? Cough many times a day while hugging a pillow. ? Use a device (incentive spirometer) to do deep breathing many times a day.  Drink enough fluid to keep your pee (urine) clear or pale yellow.  Do not wear a rib belt or binder. These do not allow you to breathe deeply.  Keep all follow-up visits as told by your doctor. This is important. Contact a doctor if:  You have a fever. Get help right away if:  You have trouble breathing.  You are short of breath.  You cannot stop coughing.  You cough up thick or bloody spit (sputum).  You feel sick to your stomach (nauseous), throw up (vomit), or have belly (abdominal) pain.  Your pain gets worse and medicine does not help. Summary  A rib fracture is a break or crack in one of the bones of the ribs.  Apply ice to the injured area and take medicines for pain as told by your doctor.  Take deep breaths and cough many times a day. Hug a pillow every time you cough. This information is not intended to replace advice given to you by your health care provider. Make sure you  discuss any questions you have with your health care provider. Document Released: 04/06/2008 Document Revised: 09/28/2016 Document Reviewed: 09/28/2016 Elsevier Interactive Patient Education  2019 Elsevier Inc.   Hematoma A hematoma is a collection of blood. A hematoma can happen:  Under the skin.  In an organ.  In a body space.  In a joint space.  In other tissues. The blood can thicken (clot) to form a lump that you can see and feel. The lump is often hard and may become sore and tender. The lump can be very small or very big. Most hematomas get better in a few days to weeks. However, some hematomas may be serious and need medical care. What are the causes? This condition is caused by:  An injury.  Blood that leaks under the skin.  Problems from surgeries.  Medical conditions that cause bleeding or bruising. What increases the risk? You are more likely to develop this condition if:  You are an older adult.  You use medicines that thin your blood. What are the signs or symptoms? Symptoms depend on where the hematoma is in your body.  If the hematoma is under the skin, there is: ? A firm lump on the body. ? Pain and tenderness in the area. ? Bruising. The skin above the lump may be blue, dark blue, purple-red, or yellowish.  If the hematoma is deep in the tissues or body spaces, there may be: ? Blood in the stomach. This may cause pain in the belly (abdomen), weakness, passing out (fainting), and shortness of breath. ? Blood in the head. This may cause a headache, weakness, trouble speaking or understanding speech, or passing out. How is this diagnosed? This condition is diagnosed based on:  Your medical history.  A physical exam.  Imaging tests, such as ultrasound or CT scan.  Blood tests. How is this treated? Treatment depends on the cause, size, and location of the hematoma. Treatment may include:  Doing nothing. Many hematomas go away on their own  without treatment.  Surgery or close monitoring. This may be needed for large hematomas or hematomas that affect the body's organs.  Medicines. These may be given if a medical condition caused the hematoma. Follow these instructions at home: Managing pain, stiffness, and swelling   If told, put ice on the area. ? Put ice in a plastic bag. ? Place a towel between your skin and the bag. ? Leave the ice on for 20 minutes, 2-3 times a day for the first two days.  If told, put heat on the affected area after putting ice on the area for two days. Use the heat source that your doctor tells you to use. This could be a moist heat pack or a heating pad. To do this: ? Place a towel between your skin and the heat source. ? Leave the heat on for 20-30 minutes. ? Remove the heat if your skin turns bright red. This is very important if you are unable to feel pain, heat, or cold. You may have a greater risk of getting burned.  Raise (elevate) the affected area above the level of your heart while you are sitting or lying down.  Wrap the affected area with an elastic bandage, if told by your doctor. Do not wrap the bandage too tightly.  If your hematoma is on a leg or foot and is painful, your doctor may give you crutches. Use them as told by your doctor. General instructions  Take over-the-counter and prescription medicines only as told by your doctor.  Keep all follow-up visits as told by your doctor. This is important. Contact a doctor if:  You have a fever.  The swelling or bruising gets worse.  You start to get more hematomas. Get help right away if:  Your pain gets worse.  Your pain is not getting better with medicine.  Your skin over the hematoma breaks or starts to bleed.  Your hematoma is in your chest or belly and you: ? Pass out. ? Feel weak. ? Become short of breath.  You have a hematoma on your scalp that is caused by a fall or injury, and you: ? Have a headache that gets  worse. ? Have trouble speaking or understanding speech. ? Become less alert or you pass out. Summary  A hematoma is a collection of blood in any part of your body.  Most hematomas get better on their own in a few days to weeks. Some may need medical care.  Follow instructions from your doctor about how to care for your hematoma.  Contact a doctor if the swelling or bruising gets worse, or if you are short of breath. This information is not intended to replace advice given to you by your health care provider. Make sure you discuss any questions you have with your health care provider. Document Released:  08/05/2004 Document Revised: 12/01/2017 Document Reviewed: 12/01/2017 Elsevier Interactive Patient Education  [employer].

## 2018-12-22 NOTE — Progress Notes (Signed)
Pt with discharge orders. Report called to facility. Pt with belongings at time of discharge.

## 2018-12-22 NOTE — Progress Notes (Signed)
Physical Therapy Treatment Patient Details Name: Larry Huffman MRN: [id number] DOB: [birth year] Today's Date: 12/22/2018    History of Present Illness Pt is a 85 y.o. M with significant PMH of dementia, CAD, PE, who presents after a fall down 8 steps with large R flank and buttock hematoma, scalp hematoma, and right rib fx 4,5.    PT Comments    Pt remains pleasantly confused, likely at cognitive baseline. Requiring moderate assist for bed mobility and min assist for transfers and ambulation. Ambulating x 20 feet with walker to and from bathroom. Requiring totalA for peri care. D/c to SNF.     Follow Up Recommendations  SNF;Supervision/Assistance - 24 hour     Equipment Recommendations  Rolling walker with 5" wheels    Recommendations for Other Services       Precautions / Restrictions Precautions Precautions: Fall Required Braces or Orthoses: Other Brace Other Brace: abdominal binder Restrictions Weight Bearing Restrictions: No    Mobility  Bed Mobility Overal bed mobility: Needs Assistance Bed Mobility: Supine to Sit   Sidelying to sit: Mod assist       General bed mobility comments: ModA for trunk elevation  Transfers Overall transfer level: Needs assistance Equipment used: Rolling walker (2 wheeled) Transfers: Sit to/from Stand Sit to Stand: Min assist         General transfer comment: Min assist to stand, pt preferring to pull from walker  Ambulation/Gait Ambulation/Gait assistance: Min assist Gait Distance (Feet): 20 Feet Assistive device: Rolling walker (2 wheeled) Gait Pattern/deviations: Step-through pattern;Trunk flexed;Decreased stride length;Wide base of support Gait velocity: decreased   General Gait Details: MinA for stability, trunk flexed posture. Ambulating to and from bathroom, cues for direction    Stairs             Wheelchair Mobility    Modified Rankin (Stroke Patients Only)       Balance Overall balance assessment:  Needs assistance Sitting-balance support: Feet supported;Bilateral upper extremity supported Sitting balance-Leahy Scale: Fair Sitting balance - Comments: progressed from mod A to min guard   Standing balance support: Bilateral upper extremity supported;During functional activity Standing balance-Leahy Scale: Poor Standing balance comment: reliant on external support and RW in standing.                            Cognition Arousal/Alertness: Awake/alert Behavior During Therapy: WFL for tasks assessed/performed Overall Cognitive Status: History of cognitive impairments - at baseline                                 General Comments: history of dementia, Pt pleasantly confused but cooperative throughout session today      Exercises      General Comments        Pertinent Vitals/Pain Pain Assessment: Faces Faces Pain Scale: No hurt    Home Living                      Prior Function            PT Goals (current goals can now be found in the care plan section) Acute Rehab PT Goals Patient Stated Goal: get comfy and take a nap Potential to Achieve Goals: Fair Progress towards PT goals: Progressing toward goals    Frequency    Min 3X/week      PT Plan Current plan remains appropriate  Co-evaluation              AM-PAC PT "6 Clicks" Mobility   Outcome Measure  Help needed turning from your back to your side while in a flat bed without using bedrails?: A Little Help needed moving from lying on your back to sitting on the side of a flat bed without using bedrails?: A Lot Help needed moving to and from a bed to a chair (including a wheelchair)?: A Little Help needed standing up from a chair using your arms (e.g., wheelchair or bedside chair)?: A Lot Help needed to walk in hospital room?: A Little Help needed climbing 3-5 steps with a railing? : Total 6 Click Score: 14    End of Session   Activity Tolerance: Patient  tolerated treatment well Patient left: with call bell/phone within reach;in bed;with bed alarm set Nurse Communication: Mobility status PT Visit Diagnosis: Unsteadiness on feet (R26.81);Other abnormalities of gait and mobility (R26.89);Difficulty in walking, not elsewhere classified (R26.2) Pain - Right/Left: Right Pain - part of body: Hip     Time: 8416-6063 PT Time Calculation (min) (ACUTE ONLY): 26 min  Charges:  $Therapeutic Activity: 23-37 mins                     Ellamae Sia, PT, DPT Acute Rehabilitation Services Pager [phone] Office [phone]    Willy Eddy 12/22/2018, 4:56 PM

## 2018-12-28 DIAGNOSIS — F0391 Unspecified dementia with behavioral disturbance: Secondary | ICD-10-CM | POA: Diagnosis not present

## 2018-12-28 DIAGNOSIS — N183 Chronic kidney disease, stage 3 (moderate): Secondary | ICD-10-CM | POA: Diagnosis not present

## 2018-12-28 DIAGNOSIS — I10 Essential (primary) hypertension: Secondary | ICD-10-CM | POA: Diagnosis not present

## 2018-12-28 DIAGNOSIS — S2241XD Multiple fractures of ribs, right side, subsequent encounter for fracture with routine healing: Secondary | ICD-10-CM | POA: Diagnosis not present

## 2019-01-05 DIAGNOSIS — I739 Peripheral vascular disease, unspecified: Secondary | ICD-10-CM | POA: Diagnosis not present

## 2019-01-05 DIAGNOSIS — D649 Anemia, unspecified: Secondary | ICD-10-CM | POA: Diagnosis not present

## 2019-01-05 DIAGNOSIS — F0390 Unspecified dementia without behavioral disturbance: Secondary | ICD-10-CM | POA: Diagnosis not present

## 2019-01-05 DIAGNOSIS — W19XXXD Unspecified fall, subsequent encounter: Secondary | ICD-10-CM | POA: Diagnosis not present

## 2019-01-05 DIAGNOSIS — M6281 Muscle weakness (generalized): Secondary | ICD-10-CM | POA: Diagnosis not present

## 2019-01-05 DIAGNOSIS — I2510 Atherosclerotic heart disease of native coronary artery without angina pectoris: Secondary | ICD-10-CM | POA: Diagnosis not present

## 2019-01-05 DIAGNOSIS — I10 Essential (primary) hypertension: Secondary | ICD-10-CM | POA: Diagnosis not present

## 2019-01-07 DIAGNOSIS — S2241XD Multiple fractures of ribs, right side, subsequent encounter for fracture with routine healing: Secondary | ICD-10-CM | POA: Diagnosis not present

## 2019-01-07 DIAGNOSIS — S2241XA Multiple fractures of ribs, right side, initial encounter for closed fracture: Secondary | ICD-10-CM | POA: Diagnosis not present

## 2019-01-07 DIAGNOSIS — M6281 Muscle weakness (generalized): Secondary | ICD-10-CM | POA: Diagnosis not present

## 2019-01-08 ENCOUNTER — Other Ambulatory Visit: Payer: Self-pay | Admitting: *Deleted

## 2019-01-08 DIAGNOSIS — S2241XA Multiple fractures of ribs, right side, initial encounter for closed fracture: Secondary | ICD-10-CM | POA: Diagnosis not present

## 2019-01-08 DIAGNOSIS — S2241XD Multiple fractures of ribs, right side, subsequent encounter for fracture with routine healing: Secondary | ICD-10-CM | POA: Diagnosis not present

## 2019-01-08 DIAGNOSIS — M6281 Muscle weakness (generalized): Secondary | ICD-10-CM | POA: Diagnosis not present

## 2019-01-08 NOTE — Patient Outreach (Signed)
[facility]) Care Management  01/08/2019  Larry Huffman Oct 16, 1923 [id number]   Subjective: Telephone call to patient's home number, spoke with patient's daughter (designated party release, [name]), stated patient's name, date of birth, and address.   Discussed [place] Management [employer] EMMI Red Alert Flag follow up, patient's daughter voiced understanding, and is in agreement to follow up on patient's behalf.  Daughter states patient is doing well, discharged home from skilled nursing facility [facility]) on 01/06/2019, receiving home health services (physical therapy, occupational therapy, and RN), and all services going well.   States she received a telephone call from primary MD's office today to follow up on recent hospitalization and they will call her back with patient's appointment time for the hospital follow up visit.  Daughter states she remembers receiving EMMI automated calls on patient's behalf.  Daughter states she has not had a chance to read patient's discharge papers, has been busy since patient's discharge, will read today , and follow up if she has any questions.  Daughter states all of patient's prescriptions have been filled and he also receives [facility] order medications.  Daughter states patient lives with her, she works from home, and has a private duty caregiver for patient while she works.   Daughter states patient is not able to manage self care, has a good appetite, and has assistance as needed with activities of daily living / home management.   Daughter voices understanding of patient's medical diagnosis and treatment plan. Daughter states her phone battery is about to die and call may automatically disconnect.  Daughter states patient does not have any education material, EMMI follow up, care coordination, care management, disease monitoring, transportation, community resource, or pharmacy needs at this time.  States she  is very appreciative of the follow up and is in agreement to receive [place] Management EMMI follow up calls if needed.     Objective: Per KPN (Knowledge Performance Now, point of care tool) and chart review, patient hospitalized 12/12/2018 - 12/22/2018 for status post fall down steps, Scalp hematoma, Right rib fracture 4,5,  Large Right flank and buttock hematoma with some muscular extravasation.   Patient inpatient 12/22/2018 -01/06/2019 at [facility] for rehab.  Patient also has a history of chronic diastolic congestive heart failure,  PE, PAD, OSA with CPAP, hypertension, and hyperlipidemia.      Assessment: Received [employer] Medicare EMMI General Discharge Red Alert Flag follow up referral on 01/08/2019.  Red Flag Alert Triggers times 3, Day #1, patient answered no to the following questions: Read discharge papers?  Scheduled follow-up?  Patient answered I don't know to the following question: New prescriptions?   EMMI follow up completed and no further care management needs.      Plan: RNCM will complete case closure due to follow up completed / no care management needs.      Georgian Mcclory H. Annia Friendly, BSN, [place] Management [facility] Telephonic CM Phone: [phone] Fax: [phone]

## 2019-01-11 DIAGNOSIS — S2241XA Multiple fractures of ribs, right side, initial encounter for closed fracture: Secondary | ICD-10-CM | POA: Diagnosis not present

## 2019-01-11 DIAGNOSIS — M6281 Muscle weakness (generalized): Secondary | ICD-10-CM | POA: Diagnosis not present

## 2019-01-11 DIAGNOSIS — S2241XD Multiple fractures of ribs, right side, subsequent encounter for fracture with routine healing: Secondary | ICD-10-CM | POA: Diagnosis not present

## 2019-01-15 ENCOUNTER — Other Ambulatory Visit: Payer: Self-pay | Admitting: *Deleted

## 2019-01-15 DIAGNOSIS — R296 Repeated falls: Secondary | ICD-10-CM | POA: Diagnosis not present

## 2019-01-15 DIAGNOSIS — S2241XA Multiple fractures of ribs, right side, initial encounter for closed fracture: Secondary | ICD-10-CM | POA: Diagnosis not present

## 2019-01-15 DIAGNOSIS — M6281 Muscle weakness (generalized): Secondary | ICD-10-CM | POA: Diagnosis not present

## 2019-01-15 DIAGNOSIS — Z09 Encounter for follow-up examination after completed treatment for conditions other than malignant neoplasm: Secondary | ICD-10-CM | POA: Diagnosis not present

## 2019-01-15 DIAGNOSIS — S2241XD Multiple fractures of ribs, right side, subsequent encounter for fracture with routine healing: Secondary | ICD-10-CM | POA: Diagnosis not present

## 2019-01-15 NOTE — Patient Outreach (Signed)
[facility]) Care Management  01/15/2019  Larry Huffman [birth year] [id number]   Subjective: Telephone call to patient's home number, spoke with patient's daughter (designated party release, Larry Huffman), stated patient's name, date of birth, and address.   Discussed [name] [employer] Medicare EMMI Red Alert Flag follow up, patient's daughter voiced understanding, and is in agreement to follow up on patient's behalf.  Daughter states she remembers speaking to this RNCM in the past, patient is doing well, currently taking a nap, will have a virtual office visit with his primary MD later today, patient feels sad at times due to missing wife, age, and decreased activities of daily living.  States patient is having a great day today and has had no sadnes.   Daughter states she  feels patient's periodic sadness is more age related, patient has no history of depression, and / or counseling, daughter aware of [name] Care Management services, and will access on patient's behalf if needed in the future.  Daughter is aware of signs/ symptoms to report, how to reach provider if needed after hours, when to go to ED, and / or call 911. Daughter states patient  does not have any education material, EMMI follow up, care coordination, care management, disease monitoring, transportation, community resource, or pharmacy needs at this time.  States she is very appreciative of the follow up and is in agreement to receive [name] EMMI follow up calls if needed in the future.     Objective: Per KPN (Knowledge Performance Now, point of care tool) and chart review, patient hospitalized 12/12/2018 - 12/22/2018 for status post fall down steps, Scalp hematoma, Rightrib fracture4,5,  Large Rightflank and buttock hematoma with some muscular extravasation.   Patient inpatient 12/22/2018 -01/06/2019 at [facility] for rehab.  Patient also has a history of chronic diastolic congestive  heart failure,  PE, PAD, OSA with CPAP, hypertension, and hyperlipidemia.      Assessment: Received [employer] Medicare EMMI General Discharge Red Alert Flag follow up referral on 01/11/2019.  Red Flag Alert Trigger, Day #4, patient answered yes to the following question: Sad/hopeless/anxious/empty?   EMMI follow up completed and no further care management needs.      Plan: RNCM will complete case closure due to follow up completed / no care management needs.      Larry Huffman H. Annia Friendly, BSN, [place] Management [facility] Telephonic CM Phone: [phone] Fax: [phone]

## 2019-01-18 DIAGNOSIS — S2241XD Multiple fractures of ribs, right side, subsequent encounter for fracture with routine healing: Secondary | ICD-10-CM | POA: Diagnosis not present

## 2019-01-18 DIAGNOSIS — S2241XA Multiple fractures of ribs, right side, initial encounter for closed fracture: Secondary | ICD-10-CM | POA: Diagnosis not present

## 2019-01-18 DIAGNOSIS — M6281 Muscle weakness (generalized): Secondary | ICD-10-CM | POA: Diagnosis not present

## 2019-01-23 DIAGNOSIS — S2241XA Multiple fractures of ribs, right side, initial encounter for closed fracture: Secondary | ICD-10-CM | POA: Diagnosis not present

## 2019-01-23 DIAGNOSIS — M6281 Muscle weakness (generalized): Secondary | ICD-10-CM | POA: Diagnosis not present

## 2019-01-23 DIAGNOSIS — S2241XD Multiple fractures of ribs, right side, subsequent encounter for fracture with routine healing: Secondary | ICD-10-CM | POA: Diagnosis not present

## 2019-01-24 DIAGNOSIS — S2241XD Multiple fractures of ribs, right side, subsequent encounter for fracture with routine healing: Secondary | ICD-10-CM | POA: Diagnosis not present

## 2019-01-24 DIAGNOSIS — S2241XA Multiple fractures of ribs, right side, initial encounter for closed fracture: Secondary | ICD-10-CM | POA: Diagnosis not present

## 2019-01-24 DIAGNOSIS — M6281 Muscle weakness (generalized): Secondary | ICD-10-CM | POA: Diagnosis not present

## 2019-01-25 DIAGNOSIS — S2241XA Multiple fractures of ribs, right side, initial encounter for closed fracture: Secondary | ICD-10-CM | POA: Diagnosis not present

## 2019-01-25 DIAGNOSIS — M6281 Muscle weakness (generalized): Secondary | ICD-10-CM | POA: Diagnosis not present

## 2019-01-25 DIAGNOSIS — S2241XD Multiple fractures of ribs, right side, subsequent encounter for fracture with routine healing: Secondary | ICD-10-CM | POA: Diagnosis not present

## 2019-01-29 DIAGNOSIS — S2241XD Multiple fractures of ribs, right side, subsequent encounter for fracture with routine healing: Secondary | ICD-10-CM | POA: Diagnosis not present

## 2019-01-29 DIAGNOSIS — M6281 Muscle weakness (generalized): Secondary | ICD-10-CM | POA: Diagnosis not present

## 2019-01-29 DIAGNOSIS — S2241XA Multiple fractures of ribs, right side, initial encounter for closed fracture: Secondary | ICD-10-CM | POA: Diagnosis not present

## 2019-01-31 DIAGNOSIS — M6281 Muscle weakness (generalized): Secondary | ICD-10-CM | POA: Diagnosis not present

## 2019-01-31 DIAGNOSIS — S2241XA Multiple fractures of ribs, right side, initial encounter for closed fracture: Secondary | ICD-10-CM | POA: Diagnosis not present

## 2019-01-31 DIAGNOSIS — S2241XD Multiple fractures of ribs, right side, subsequent encounter for fracture with routine healing: Secondary | ICD-10-CM | POA: Diagnosis not present

## 2019-02-01 DIAGNOSIS — S2241XD Multiple fractures of ribs, right side, subsequent encounter for fracture with routine healing: Secondary | ICD-10-CM | POA: Diagnosis not present

## 2019-02-01 DIAGNOSIS — S2241XA Multiple fractures of ribs, right side, initial encounter for closed fracture: Secondary | ICD-10-CM | POA: Diagnosis not present

## 2019-02-01 DIAGNOSIS — M6281 Muscle weakness (generalized): Secondary | ICD-10-CM | POA: Diagnosis not present

## 2019-02-05 DIAGNOSIS — S2241XA Multiple fractures of ribs, right side, initial encounter for closed fracture: Secondary | ICD-10-CM | POA: Diagnosis not present

## 2019-02-05 DIAGNOSIS — S2241XD Multiple fractures of ribs, right side, subsequent encounter for fracture with routine healing: Secondary | ICD-10-CM | POA: Diagnosis not present

## 2019-02-05 DIAGNOSIS — M6281 Muscle weakness (generalized): Secondary | ICD-10-CM | POA: Diagnosis not present

## 2019-02-08 DIAGNOSIS — S2241XA Multiple fractures of ribs, right side, initial encounter for closed fracture: Secondary | ICD-10-CM | POA: Diagnosis not present

## 2019-02-08 DIAGNOSIS — M6281 Muscle weakness (generalized): Secondary | ICD-10-CM | POA: Diagnosis not present

## 2019-02-08 DIAGNOSIS — S2241XD Multiple fractures of ribs, right side, subsequent encounter for fracture with routine healing: Secondary | ICD-10-CM | POA: Diagnosis not present

## 2019-02-27 DIAGNOSIS — G4733 Obstructive sleep apnea (adult) (pediatric): Secondary | ICD-10-CM | POA: Diagnosis not present

## 2019-04-02 DIAGNOSIS — L821 Other seborrheic keratosis: Secondary | ICD-10-CM | POA: Diagnosis not present

## 2019-04-02 DIAGNOSIS — L308 Other specified dermatitis: Secondary | ICD-10-CM | POA: Diagnosis not present

## 2019-04-02 DIAGNOSIS — Z1283 Encounter for screening for malignant neoplasm of skin: Secondary | ICD-10-CM | POA: Diagnosis not present

## 2019-04-04 DIAGNOSIS — L308 Other specified dermatitis: Secondary | ICD-10-CM | POA: Diagnosis not present

## 2019-04-04 DIAGNOSIS — Z79899 Other long term (current) drug therapy: Secondary | ICD-10-CM | POA: Diagnosis not present

## 2019-04-06 DIAGNOSIS — E785 Hyperlipidemia, unspecified: Secondary | ICD-10-CM | POA: Diagnosis not present

## 2019-04-06 DIAGNOSIS — Z0000 Encounter for general adult medical examination without abnormal findings: Secondary | ICD-10-CM | POA: Diagnosis not present

## 2019-04-06 DIAGNOSIS — I10 Essential (primary) hypertension: Secondary | ICD-10-CM | POA: Diagnosis not present

## 2019-04-06 DIAGNOSIS — D638 Anemia in other chronic diseases classified elsewhere: Secondary | ICD-10-CM | POA: Diagnosis not present

## 2019-04-06 DIAGNOSIS — Z125 Encounter for screening for malignant neoplasm of prostate: Secondary | ICD-10-CM | POA: Diagnosis not present

## 2019-04-06 DIAGNOSIS — N183 Chronic kidney disease, stage 3 (moderate): Secondary | ICD-10-CM | POA: Diagnosis not present

## 2019-05-15 DIAGNOSIS — R05 Cough: Secondary | ICD-10-CM | POA: Diagnosis not present

## 2019-05-15 DIAGNOSIS — R5381 Other malaise: Secondary | ICD-10-CM | POA: Diagnosis not present

## 2019-05-15 DIAGNOSIS — R509 Fever, unspecified: Secondary | ICD-10-CM | POA: Diagnosis not present

## 2019-05-15 DIAGNOSIS — J029 Acute pharyngitis, unspecified: Secondary | ICD-10-CM | POA: Diagnosis not present

## 2019-05-16 ENCOUNTER — Inpatient Hospital Stay (HOSPITAL_COMMUNITY)
Admission: EM | Admit: 2019-05-16 | Discharge: 2019-06-12 | DRG: 177 | Disposition: E | Payer: Medicare HMO | Attending: Internal Medicine | Admitting: Internal Medicine

## 2019-05-16 ENCOUNTER — Emergency Department (HOSPITAL_COMMUNITY): Payer: Medicare HMO

## 2019-05-16 ENCOUNTER — Other Ambulatory Visit: Payer: Self-pay

## 2019-05-16 DIAGNOSIS — G4733 Obstructive sleep apnea (adult) (pediatric): Secondary | ICD-10-CM | POA: Diagnosis present

## 2019-05-16 DIAGNOSIS — Z209 Contact with and (suspected) exposure to unspecified communicable disease: Secondary | ICD-10-CM | POA: Diagnosis not present

## 2019-05-16 DIAGNOSIS — S3993XA Unspecified injury of pelvis, initial encounter: Secondary | ICD-10-CM | POA: Diagnosis not present

## 2019-05-16 DIAGNOSIS — Z515 Encounter for palliative care: Secondary | ICD-10-CM | POA: Diagnosis not present

## 2019-05-16 DIAGNOSIS — N390 Urinary tract infection, site not specified: Secondary | ICD-10-CM

## 2019-05-16 DIAGNOSIS — R220 Localized swelling, mass and lump, head: Secondary | ICD-10-CM | POA: Diagnosis not present

## 2019-05-16 DIAGNOSIS — Y92009 Unspecified place in unspecified non-institutional (private) residence as the place of occurrence of the external cause: Secondary | ICD-10-CM

## 2019-05-16 DIAGNOSIS — S299XXA Unspecified injury of thorax, initial encounter: Secondary | ICD-10-CM | POA: Diagnosis not present

## 2019-05-16 DIAGNOSIS — S0990XA Unspecified injury of head, initial encounter: Secondary | ICD-10-CM | POA: Diagnosis present

## 2019-05-16 DIAGNOSIS — Z20828 Contact with and (suspected) exposure to other viral communicable diseases: Secondary | ICD-10-CM | POA: Diagnosis not present

## 2019-05-16 DIAGNOSIS — M1990 Unspecified osteoarthritis, unspecified site: Secondary | ICD-10-CM | POA: Diagnosis present

## 2019-05-16 DIAGNOSIS — Z955 Presence of coronary angioplasty implant and graft: Secondary | ICD-10-CM | POA: Diagnosis not present

## 2019-05-16 DIAGNOSIS — W19XXXA Unspecified fall, initial encounter: Secondary | ICD-10-CM | POA: Diagnosis not present

## 2019-05-16 DIAGNOSIS — H9190 Unspecified hearing loss, unspecified ear: Secondary | ICD-10-CM | POA: Diagnosis present

## 2019-05-16 DIAGNOSIS — J1289 Other viral pneumonia: Secondary | ICD-10-CM | POA: Diagnosis present

## 2019-05-16 DIAGNOSIS — Z8249 Family history of ischemic heart disease and other diseases of the circulatory system: Secondary | ICD-10-CM

## 2019-05-16 DIAGNOSIS — I5032 Chronic diastolic (congestive) heart failure: Secondary | ICD-10-CM | POA: Diagnosis present

## 2019-05-16 DIAGNOSIS — I2510 Atherosclerotic heart disease of native coronary artery without angina pectoris: Secondary | ICD-10-CM | POA: Diagnosis present

## 2019-05-16 DIAGNOSIS — Z79899 Other long term (current) drug therapy: Secondary | ICD-10-CM

## 2019-05-16 DIAGNOSIS — Z66 Do not resuscitate: Secondary | ICD-10-CM | POA: Diagnosis not present

## 2019-05-16 DIAGNOSIS — S51812A Laceration without foreign body of left forearm, initial encounter: Secondary | ICD-10-CM | POA: Diagnosis present

## 2019-05-16 DIAGNOSIS — J1282 Pneumonia due to coronavirus disease 2019: Secondary | ICD-10-CM

## 2019-05-16 DIAGNOSIS — E785 Hyperlipidemia, unspecified: Secondary | ICD-10-CM | POA: Diagnosis present

## 2019-05-16 DIAGNOSIS — Z881 Allergy status to other antibiotic agents status: Secondary | ICD-10-CM

## 2019-05-16 DIAGNOSIS — J129 Viral pneumonia, unspecified: Secondary | ICD-10-CM

## 2019-05-16 DIAGNOSIS — B961 Klebsiella pneumoniae [K. pneumoniae] as the cause of diseases classified elsewhere: Secondary | ICD-10-CM | POA: Diagnosis present

## 2019-05-16 DIAGNOSIS — E7800 Pure hypercholesterolemia, unspecified: Secondary | ICD-10-CM | POA: Diagnosis present

## 2019-05-16 DIAGNOSIS — J189 Pneumonia, unspecified organism: Secondary | ICD-10-CM

## 2019-05-16 DIAGNOSIS — R410 Disorientation, unspecified: Secondary | ICD-10-CM | POA: Diagnosis not present

## 2019-05-16 DIAGNOSIS — Z23 Encounter for immunization: Secondary | ICD-10-CM | POA: Diagnosis present

## 2019-05-16 DIAGNOSIS — I499 Cardiac arrhythmia, unspecified: Secondary | ICD-10-CM | POA: Diagnosis not present

## 2019-05-16 DIAGNOSIS — J029 Acute pharyngitis, unspecified: Secondary | ICD-10-CM | POA: Diagnosis not present

## 2019-05-16 DIAGNOSIS — I739 Peripheral vascular disease, unspecified: Secondary | ICD-10-CM | POA: Diagnosis present

## 2019-05-16 DIAGNOSIS — I4891 Unspecified atrial fibrillation: Secondary | ICD-10-CM | POA: Diagnosis present

## 2019-05-16 DIAGNOSIS — N183 Chronic kidney disease, stage 3 unspecified: Secondary | ICD-10-CM | POA: Diagnosis present

## 2019-05-16 DIAGNOSIS — W06XXXA Fall from bed, initial encounter: Secondary | ICD-10-CM | POA: Diagnosis present

## 2019-05-16 DIAGNOSIS — F0390 Unspecified dementia without behavioral disturbance: Secondary | ICD-10-CM | POA: Diagnosis present

## 2019-05-16 DIAGNOSIS — U071 COVID-19: Secondary | ICD-10-CM | POA: Diagnosis present

## 2019-05-16 DIAGNOSIS — Z888 Allergy status to other drugs, medicaments and biological substances status: Secondary | ICD-10-CM

## 2019-05-16 DIAGNOSIS — K219 Gastro-esophageal reflux disease without esophagitis: Secondary | ICD-10-CM | POA: Diagnosis present

## 2019-05-16 DIAGNOSIS — I130 Hypertensive heart and chronic kidney disease with heart failure and stage 1 through stage 4 chronic kidney disease, or unspecified chronic kidney disease: Secondary | ICD-10-CM | POA: Diagnosis present

## 2019-05-16 DIAGNOSIS — R52 Pain, unspecified: Secondary | ICD-10-CM | POA: Diagnosis not present

## 2019-05-16 DIAGNOSIS — R000 Tachycardia, unspecified: Secondary | ICD-10-CM | POA: Diagnosis present

## 2019-05-16 DIAGNOSIS — Z8673 Personal history of transient ischemic attack (TIA), and cerebral infarction without residual deficits: Secondary | ICD-10-CM

## 2019-05-16 DIAGNOSIS — J9601 Acute respiratory failure with hypoxia: Secondary | ICD-10-CM | POA: Diagnosis present

## 2019-05-16 DIAGNOSIS — R58 Hemorrhage, not elsewhere classified: Secondary | ICD-10-CM | POA: Diagnosis not present

## 2019-05-16 DIAGNOSIS — N179 Acute kidney failure, unspecified: Secondary | ICD-10-CM | POA: Diagnosis present

## 2019-05-16 DIAGNOSIS — S0993XA Unspecified injury of face, initial encounter: Secondary | ICD-10-CM | POA: Diagnosis not present

## 2019-05-16 DIAGNOSIS — E872 Acidosis: Secondary | ICD-10-CM | POA: Diagnosis present

## 2019-05-16 DIAGNOSIS — S8992XA Unspecified injury of left lower leg, initial encounter: Secondary | ICD-10-CM | POA: Diagnosis not present

## 2019-05-16 DIAGNOSIS — S199XXA Unspecified injury of neck, initial encounter: Secondary | ICD-10-CM | POA: Diagnosis not present

## 2019-05-16 DIAGNOSIS — M542 Cervicalgia: Secondary | ICD-10-CM | POA: Diagnosis present

## 2019-05-16 DIAGNOSIS — Z974 Presence of external hearing-aid: Secondary | ICD-10-CM

## 2019-05-16 DIAGNOSIS — D509 Iron deficiency anemia, unspecified: Secondary | ICD-10-CM | POA: Diagnosis present

## 2019-05-16 DIAGNOSIS — Z7902 Long term (current) use of antithrombotics/antiplatelets: Secondary | ICD-10-CM

## 2019-05-16 DIAGNOSIS — N3000 Acute cystitis without hematuria: Secondary | ICD-10-CM | POA: Diagnosis not present

## 2019-05-16 DIAGNOSIS — E860 Dehydration: Secondary | ICD-10-CM | POA: Diagnosis present

## 2019-05-16 DIAGNOSIS — R509 Fever, unspecified: Secondary | ICD-10-CM | POA: Diagnosis not present

## 2019-05-16 DIAGNOSIS — Z9189 Other specified personal risk factors, not elsewhere classified: Secondary | ICD-10-CM

## 2019-05-16 DIAGNOSIS — R5381 Other malaise: Secondary | ICD-10-CM | POA: Diagnosis not present

## 2019-05-16 DIAGNOSIS — R0902 Hypoxemia: Secondary | ICD-10-CM | POA: Diagnosis not present

## 2019-05-16 DIAGNOSIS — R05 Cough: Secondary | ICD-10-CM | POA: Diagnosis not present

## 2019-05-16 DIAGNOSIS — J188 Other pneumonia, unspecified organism: Secondary | ICD-10-CM

## 2019-05-16 DIAGNOSIS — Z20822 Contact with and (suspected) exposure to covid-19: Secondary | ICD-10-CM | POA: Diagnosis present

## 2019-05-16 DIAGNOSIS — Z7982 Long term (current) use of aspirin: Secondary | ICD-10-CM

## 2019-05-16 DIAGNOSIS — Z87891 Personal history of nicotine dependence: Secondary | ICD-10-CM

## 2019-05-16 DIAGNOSIS — Z9849 Cataract extraction status, unspecified eye: Secondary | ICD-10-CM

## 2019-05-16 MED ORDER — TETANUS-DIPHTH-ACELL PERTUSSIS 5-2.5-18.5 LF-MCG/0.5 IM SUSP
0.5000 mL | Freq: Once | INTRAMUSCULAR | Status: AC
  Administered 2019-05-16: 0.5 mL via INTRAMUSCULAR
  Filled 2019-05-16: qty 0.5

## 2019-05-16 MED ORDER — ACETAMINOPHEN 500 MG PO TABS
1000.0000 mg | ORAL_TABLET | Freq: Once | ORAL | Status: AC
  Administered 2019-05-16: 1000 mg via ORAL
  Filled 2019-05-16: qty 2

## 2019-05-16 NOTE — ED Provider Notes (Signed)
Medical screening examination/treatment/procedure(s) were conducted as a shared visit with non-physician practitioner(s) and myself.  I personally evaluated the patient during the encounter.    85 year old male here after patient fell out of bed and hit his head.  Was found to be febrile here.  X-ray consistent with Covid pneumonia.  Will have imaging and blood work and be admitted   Lacretia Leigh, MD 06/09/2019 2338

## 2019-05-16 NOTE — ED Triage Notes (Signed)
Pt sleeping, got out of bed and fell, hit head on nightstand. Hx of afib, on Plavix. Daughter heard and came to aid, unsure on LOC or not. C-collar placed as precautionary measure.  A&Ox4, VSS en route. 20g in R wrist

## 2019-05-16 NOTE — ED Provider Notes (Signed)
Patient from home (?w/family) after fall Unwitnessed, has multiple skin tears Imaging finds no acute abnormalities from fall. CXR shows opacities concerning for atypical PNA, ?COVID, which fits clinical picture.  Family just back from [place] after being down there for a week Here, has low grade fever, 90% RA O2 saturation COVID test pending  Plan: Admit after labs resulted.  Leukocytosis of 16, usually inconsistent with COVID, however, findings on CXR, history of recent travel to endemic area and patient's clinical presentation suggest COVID infection  Discussed with Dr. Marlowe Sax, [facility], who accepts the patient for admission.    Charlann Lange, PA-C 05/17/19 0147    Lacretia Leigh, MD 05/24/19 [phone]

## 2019-05-16 NOTE — ED Provider Notes (Signed)
[facility] EMERGENCY DEPARTMENT Provider Note   CSN: [id number] Arrival date & time: 05/27/2019  2214     History   Chief Complaint Chief Complaint  Patient presents with  . Fall    HPI Larry Huffman is a 85 y.o. male.     HPI  Patient is a 85 year old male with a history of CAD, cancer, hyperlipidemia, hypertension, hard of hearing, peripheral vascular disease, sleep apnea, dementia, who presents the emergency department today via EMS for evaluation after a fall.  Per EMS, patient has history of dementia and is at baseline per family member who was in the house with patient.  They state that he got up out of his bed and fell to the ground.  They came into his room after they heard a loud noise and patient was calling out. He did hit his head.  They state he is on plavix.  He states he currently traveled from [place] and have been feeling well for the past few days so patient and family members are currently waiting for results of Covid testing.  Patient demented therefore there is a level 5 caveat.  He denies any chest pain or shortness of breath.  He complains of pain to his bilateral hips, knee and left forearm.  10:56 PM Discussed case with patients daughter [name], who states that family recently flew to [place]. Upon returning home the patient and his family started feeling ill.  He has had fever, chills starting yesterday.   Past Medical History:  Diagnosis Date  . Acid reflux   . Arthritis   . CAD (coronary artery disease)   . Cancer (HCC) hx of skin cancer  . Constipation   . Coronary artery disease   . High cholesterol   . HOH (hard of hearing)    wears hearing aids, but still can not hear  . Hypertension   . Incontinence of urine   . Peripheral vascular disease ([place])    Remote left carotid endarterectomy  . Sleep apnea    CPAP    Patient Active Problem List   Diagnosis Date Noted  . Right flank hematoma 12/12/2018  . Long-term  (current) use of anticoagulants 02/21/2015  . Allergic rhinitis 01/24/2015  . Malnutrition of moderate degree ([place]) 01/16/2015  . Warfarin-induced coagulopathy ([place])   . Melena   . Severe anemia 01/15/2015  . GI bleed 01/15/2015  . Acute blood loss anemia 01/15/2015  . Chronic anticoagulation 06/04/2014  . Dementia ([place]) 04/19/2014  . Chronic diastolic heart failure ([place]) 04/15/2014  . Chronic kidney disease (CKD), stage III (moderate) 04/15/2014  . Pulmonary embolism ([place]) 04/13/2014  . Spinal stenosis, lumbar region, with neurogenic claudication 02/08/2014    Class: Diagnosis of  . Peripheral arterial disease: History of left carotid endarterectomy 03/15/2013  . Obstructive sleep apnea: Uses CPAP 03/15/2013  . Venous insufficiency 03/15/2013  . Essential hypertension 03/15/2013  . HLD (hyperlipidemia) 03/15/2013  . Chest pain 07/28/2012  . CAD - CFX DES 7/07, LAD DES 10/08, cath 11/11- medical Rx 07/28/2012    Past Surgical History:  Procedure Laterality Date  . 2-D echocardiogram  03/07/2008   Ejection fraction greater than 55%. Mild concentric left ventricular hypertrophy.LV relaxation. mildly dilated left atrium. Mild mitral regurgitation.   . ANAL FISSURE REPAIR    . CARDIAC CATHETERIZATION     X 2 stents  . COLON SURGERY     removed part of the small intestine  . COLONOSCOPY W/ POLYPECTOMY    .  CORONARY STENT PLACEMENT    . ESOPHAGOGASTRODUODENOSCOPY N/A 01/16/2015   Procedure: ESOPHAGOGASTRODUODENOSCOPY (EGD);  Surgeon: Irene Shipper, MD;  Location: [facility] ENDOSCOPY;  Service: Endoscopy;  Laterality: N/A;  . EYE SURGERY     cataracts  . I&D EXTREMITY  06/28/2012   Procedure: IRRIGATION AND DEBRIDEMENT EXTREMITY;  Surgeon: Newt Minion, MD;  Location: [facility];  Service: Orthopedics;  Laterality: Right;  Debridement Wound Right Leg, VAC, Antibiotic Beads, Apply A-Cell  . IRRIGATION AND DEBRIDEMENT ABSCESS    . LAPAROTOMY    . LUMBAR LAMINECTOMY N/A 02/08/2014   Procedure:  L3-4, L4-5 Decompression;  Surgeon: Marybelle Killings, MD;  Location: [facility];  Service: Orthopedics;  Laterality: N/A;  L3-4, L4-5 Decompression  . NASAL SINUS SURGERY    . PROSTATE SURGERY    . ROTATOR CUFF REPAIR          Home Medications    Prior to Admission medications   Medication Sig Start Date End Date Taking? Authorizing Provider  acetaminophen (TYLENOL) 500 MG tablet Take 1,000 mg by mouth 2 (two) times daily as needed for mild pain.     [provider]  amLODipine (NORVASC) 10 MG tablet Take 10 mg by mouth daily.    [provider]  Ascorbic Acid (VITAMIN C) 1000 MG tablet Take 1,000 mg by mouth daily. 07/01/10   [provider]  aspirin EC 81 MG tablet Take 81 mg by mouth at bedtime.     [provider]  clopidogrel (PLAVIX) 75 MG tablet Take 1 tablet (75 mg total) by mouth daily. 03/26/16   Lorretta Harp, MD  docusate sodium (COLACE) 100 MG capsule Take 100 mg by mouth as needed for constipation.    [provider]  doxepin (SINEQUAN) 25 MG capsule Take 25 mg by mouth 2 (two) times a day. 12/02/18   [provider]  ferrous sulfate 325 (65 FE) MG tablet Take 650 mg by mouth daily with breakfast.     [provider]  loratadine (CLARITIN) 10 MG tablet Take 5 mg by mouth daily.    [provider]  methocarbamol (ROBAXIN) 500 MG tablet Take 1 tablet (500 mg total) by mouth every 6 (six) hours as needed for muscle spasms. 12/22/18   Rayburn, Claiborne Billings A, PA-C  montelukast (SINGULAIR) 10 MG tablet Take 10 mg by mouth at bedtime.    [provider]  Multiple Vitamins-Minerals (PRESERVISION AREDS 2 PO) Take 1 tablet by mouth daily.    [provider]  Multiple Vitamins-Minerals (PX COMPLETE SENIOR MULTIVITS PO) Take 1 tablet by mouth every morning.    [provider]  nitroGLYCERIN (NITROSTAT) 0.4 MG SL tablet Place 1 tablet (0.4 mg total) under the tongue every 5 (five) minutes as needed for  chest pain. 09/25/15   Lorretta Harp, MD  Omega-3 Fatty Acids (FISH OIL) 1200 MG CAPS Take 1,200 mg by mouth daily.     [provider]  pantoprazole (PROTONIX) 40 MG tablet Take 1 tablet (40 mg total) by mouth daily. 04/02/16   Lorretta Harp, MD  polyethylene glycol [facility] / Floria Raveling) packet Take 17 g by mouth as needed (constipation).     [provider]  Probiotic Product (PROBIOTIC DAILY PO) Take 1 tablet by mouth every morning.    [provider]  pyridOXINE (VITAMIN B-6) 100 MG tablet Take 100 mg by mouth daily.     [provider]  simvastatin (ZOCOR) 10 MG tablet Take 1 tablet (  10 mg total) by mouth at bedtime. 04/02/16   Lorretta Harp, MD  triamcinolone cream (KENALOG) 0.1 % Apply 1 application topically 2 (two) times daily as needed for irritation. 11/29/18   [provider]    Family History Family History  Problem Relation Age of Onset  . Hypertension Mother     Social History Social History   Tobacco Use  . Smoking status: Former Smoker    Types: Cigarettes    Quit date: 07/12/1978    Years since quitting: 40.8  . Smokeless tobacco: Never Used  Substance Use Topics  . Alcohol use: No  . Drug use: No     Allergies   Ciprofloxacin, Tape, and Dapsone   Review of Systems Review of Systems  Unable to perform ROS: Dementia  Respiratory: Negative for cough and shortness of breath.   Cardiovascular: Negative for chest pain.  Skin: Positive for wound.  Neurological:       Head trauma     Physical Exam Updated Vital Signs BP (!) 177/59   Pulse 87   Temp (!) 100.6 F (38.1 C) (Rectal)   Resp (!) 28   SpO2 99%   Physical Exam Vitals signs and nursing note reviewed.  Constitutional:      Appearance: He is well-developed.  HENT:     Head: Normocephalic.     Comments: Skin tear to chin    Mouth/Throat:     Mouth: Mucous membranes are dry.  Eyes:     Extraocular Movements: Extraocular movements intact.      Conjunctiva/sclera: Conjunctivae normal.     Pupils: Pupils are equal, round, and reactive to light.  Neck:     Musculoskeletal: Neck supple.  Cardiovascular:     Rate and Rhythm: Normal rate and regular rhythm.     Heart sounds: No murmur.  Pulmonary:     Effort: Pulmonary effort is normal. No respiratory distress.     Breath sounds: Rales (bilat lower lobes) present.  Abdominal:     General: Bowel sounds are normal.     Palpations: Abdomen is soft.     Tenderness: There is no abdominal tenderness.  Musculoskeletal:     Comments: Skin tear to left distal forearm, and right proximal forearm. TTP to the left forearm, no wrist TTP. No TTP to the right forearm. TTP to the cervical spine, no thoracic lumbar TTP. TTP to the left knee and bilat hips. Pelvis stable to compression.   Skin:    General: Skin is warm and dry.  Neurological:     Mental Status: He is alert.     Comments: Clear speech, moving all extremities      ED Treatments / Results  Labs (all labs ordered are listed, but only abnormal results are displayed) Labs Reviewed  SARS CORONAVIRUS 2 (TAT 6-24 HRS)  CULTURE, BLOOD (ROUTINE X 2)  CULTURE, BLOOD (ROUTINE X 2)  CBC WITH DIFFERENTIAL/PLATELET  COMPREHENSIVE METABOLIC PANEL  URINALYSIS, ROUTINE W REFLEX MICROSCOPIC  LACTIC ACID, PLASMA  LACTIC ACID, PLASMA    EKG None  Radiology Dg Forearm Left  Result Date: 05/28/2019 CLINICAL DATA:  Fall, skin tears on left forearm EXAM: LEFT FOREARM - 2 VIEW COMPARISON:  10/14/2017 FINDINGS: No acute bony abnormality. Specifically, no fracture, subluxation, or dislocation. Soft tissue laceration noted in the distal forearm. No radiopaque foreign body. IMPRESSION: No acute bony abnormality. Electronically Signed   By: Rolm Baptise M.D.   On: 05/17/2019 23:26   Ct Cervical Spine Wo Contrast  Result Date: 06/09/2019 CLINICAL DATA:  Fall, positive head strike EXAM: CT HEAD WITHOUT CONTRAST CT CERVICAL SPINE WITHOUT  CONTRAST TECHNIQUE: Multidetector CT imaging of the head and cervical spine was performed following the standard protocol without intravenous contrast. Multiplanar CT image reconstructions of the cervical spine were also generated. COMPARISON:  CT December 12, 2018 FINDINGS: CT HEAD FINDINGS Brain: Stable gliosis in the right occipital lobe compatible remote infarct. Punctate lacune in the right thalamus is new since comparison study but has an overall subacute to chronic appearance. Additional lacunar type infarcts in the left basal ganglia and left pons are similar to prior. No acute hemorrhage or extra-axial collection. No mass effect or midline shift. Patchy and more confluent areas of white matter hypoattenuation are most compatible with chronic microvascular angiopathy. Symmetric prominence of the ventricles, cisterns and sulci compatible with moderate parenchymal volume loss. Vascular: Atherosclerotic calcification of the carotid siphons. No hyperdense vessel. Skull: High right frontoparietal scalp swelling without large subjacent hematoma or calvarial fracture. No suspicious osseous lesions. Sinuses/Orbits: Mild mural thickening of the paranasal sinuses. No air-fluid levels are seen. Bilateral lens extractions and senescent scleral plaques are present. A occluded orbits are otherwise unremarkable. Other: None CT CERVICAL SPINE FINDINGS Alignment: There is slight exaggeration of the thoracic kyphosis. Stepwise anterolisthesis C2-C5, mild retrolisthesis C5 on C6, minimal anterolisthesis C6 on C7. These features are likely on a degenerative basis with stability from prior study. Craniocervical and atlantoaxial articulations are maintained albeit with significant arthrosis and calcific pannus formation at the dens. No abnormal facet widening. Skull base and vertebrae: Bony fusion across the right third through fifth articular facets and the left third and fourth articular facets. Calcific pannus formation posterior  to the dens may reflect calcium pyrophosphate deposition. No acute fracture. No primary bone lesion or focal pathologic process. Soft tissues and spinal canal: No pre or paravertebral fluid or swelling. No visible canal hematoma. Disc levels: Multilevel intervertebral disc height loss with spondylitic endplate changes. Features are most pronounced at the C5-6 level with mild canal stenosis and at least moderate bilateral foraminal narrowing. Additional mild to moderate multilevel foraminal narrowing is noted elsewhere in the included cervical spine. Upper chest: Pleuroparenchymal scarring in the apices with stable calcified apical pleural plaques. Other: Extensive vascular calcifications of the cervical vessels. Normal thyroid. IMPRESSION: 1. No acute intracranial hemorrhage or extra-axial collection. 2. Age-indeterminate lacune in the right thalamus, new from prior exam though favor subacute to chronic. 3. Additional lacune in the left basal ganglia and left pons are similar to prior. 4. Extensive microvascular angiopathic changes and moderate parenchymal volume loss, similar to prior. 5. High right frontoparietal scalp swelling without large subjacent hematoma or calvarial fracture. 6. No acute cervical spine fracture or traumatic listhesis. 7. Cervical spondylitic changes maximal C5-6, as above 8. Intracranial and cervical carotid atherosclerosis. Electronically Signed   By: Lovena Le M.D.   On: 05/25/2019 23:48   Dg Pelvis Portable  Result Date: 05/17/2019 CLINICAL DATA:  Fall EXAM: PORTABLE PELVIS 1-2 VIEWS COMPARISON:  CT abdomen pelvis December 12, 2018 FINDINGS: No evidence of acute pelvic fracture or diastasis. The femoral heads are normally located. Moderate degenerative changes are noted in lower lumbar spine. Additional degenerative features are present in both SI joints and the bilateral hips, left slightly greater than right which is similar to comparison CT from December 12, 2018. No suspicious osseous  lesions. Bowel gas pattern is unremarkable. Vascular calcium noted in the soft tissues. Soft tissues are otherwise unremarkable. IMPRESSION:  1. No acute pelvic fracture or diastasis. 2. Stable degenerative changes in the pelvis and bilateral hips, left slightly greater than right. Electronically Signed   By: Lovena Le M.D.   On: 05/20/2019 23:34   Dg Chest Portable 1 View  Result Date: 05/25/2019 CLINICAL DATA:  Fall.  Possible COVID. EXAM: PORTABLE CHEST 1 VIEW COMPARISON:  12/12/2018 FINDINGS: Patchy airspace disease noted in both lungs, most prominent peripherally and in the right mid lung. Findings concerning for multifocal pneumonia, possibly COVID pneumonia. Cardiomegaly. No effusions or pneumothorax. No acute bony abnormality. IMPRESSION: Patchy bilateral airspace opacities concerning for multifocal pneumonia, possibly COVID pneumonia. Cardiomegaly. Electronically Signed   By: Rolm Baptise M.D.   On: 05/18/2019 23:26   Dg Knee Left Port  Result Date: 05/25/2019 CLINICAL DATA:  Fall EXAM: PORTABLE LEFT KNEE - 1-2 VIEW COMPARISON:  None. FINDINGS: Joint space narrowing throughout the left knee. No joint effusion. No acute bony abnormality. Specifically, no fracture, subluxation, or dislocation. IMPRESSION: Early degenerative changes.  No acute bony abnormality. Electronically Signed   By: Rolm Baptise M.D.   On: 06/05/2019 23:35    Procedures Procedures (including critical care time)  Medications Ordered in ED Medications  Tdap (BOOSTRIX) injection 0.5 mL (has no administration in time range)  acetaminophen (TYLENOL) tablet 1,000 mg (1,000 mg Oral Given 05/15/2019 2353)     Initial Impression / Assessment and Plan / ED Course  I have reviewed the triage vital signs and the nursing notes.  Pertinent labs & imaging results that were available during my care of the patient were reviewed by me and considered in my medical decision making (see chart for details).     Final Clinical  Impressions(s) / ED Diagnoses   Final diagnoses:  Hypoxia   85 year old male with history of dementia presenting for evaluation of mechanical fall at home prior to arrival.  Is currently being evaluated for the coronavirus after traveling to [place] with his family about a week and a half ago.  On arrival found to be febrile, tachypneic, hypoxic.  Normal blood pressure.  Does have cervical spine tenderness, rales to the bilateral lower lobes, no tachycardia or murmurs.  No abdominal tenderness.  Does have bilateral hip tenderness, pelvis stable to compression.  Several skin tears that are nonrepairable.  Imaging thus far is reassuring.  At shift change, care transition to [facility] up still, PA-C with plan to follow-up on pending laboratory work.  Anticipate admission likely for coronavirus in setting of new hypoxia.  ED Discharge Orders    None       Bishop Dublin 05/28/2019 2356    Lacretia Leigh, MD 05/17/19 1324

## 2019-05-17 ENCOUNTER — Emergency Department (HOSPITAL_COMMUNITY): Payer: Medicare HMO

## 2019-05-17 DIAGNOSIS — J189 Pneumonia, unspecified organism: Secondary | ICD-10-CM | POA: Diagnosis not present

## 2019-05-17 DIAGNOSIS — E7800 Pure hypercholesterolemia, unspecified: Secondary | ICD-10-CM | POA: Diagnosis present

## 2019-05-17 DIAGNOSIS — H9190 Unspecified hearing loss, unspecified ear: Secondary | ICD-10-CM | POA: Diagnosis present

## 2019-05-17 DIAGNOSIS — E872 Acidosis: Secondary | ICD-10-CM | POA: Diagnosis present

## 2019-05-17 DIAGNOSIS — J9601 Acute respiratory failure with hypoxia: Secondary | ICD-10-CM | POA: Diagnosis present

## 2019-05-17 DIAGNOSIS — Z20828 Contact with and (suspected) exposure to other viral communicable diseases: Secondary | ICD-10-CM | POA: Diagnosis not present

## 2019-05-17 DIAGNOSIS — I739 Peripheral vascular disease, unspecified: Secondary | ICD-10-CM | POA: Diagnosis present

## 2019-05-17 DIAGNOSIS — N390 Urinary tract infection, site not specified: Secondary | ICD-10-CM | POA: Diagnosis present

## 2019-05-17 DIAGNOSIS — S0990XA Unspecified injury of head, initial encounter: Secondary | ICD-10-CM | POA: Diagnosis present

## 2019-05-17 DIAGNOSIS — J1289 Other viral pneumonia: Secondary | ICD-10-CM | POA: Diagnosis present

## 2019-05-17 DIAGNOSIS — W19XXXA Unspecified fall, initial encounter: Secondary | ICD-10-CM

## 2019-05-17 DIAGNOSIS — Y92009 Unspecified place in unspecified non-institutional (private) residence as the place of occurrence of the external cause: Secondary | ICD-10-CM | POA: Diagnosis not present

## 2019-05-17 DIAGNOSIS — U071 COVID-19: Secondary | ICD-10-CM | POA: Diagnosis present

## 2019-05-17 DIAGNOSIS — Z66 Do not resuscitate: Secondary | ICD-10-CM | POA: Diagnosis not present

## 2019-05-17 DIAGNOSIS — I5032 Chronic diastolic (congestive) heart failure: Secondary | ICD-10-CM | POA: Diagnosis present

## 2019-05-17 DIAGNOSIS — Z515 Encounter for palliative care: Secondary | ICD-10-CM | POA: Diagnosis not present

## 2019-05-17 DIAGNOSIS — Z955 Presence of coronary angioplasty implant and graft: Secondary | ICD-10-CM | POA: Diagnosis not present

## 2019-05-17 DIAGNOSIS — K219 Gastro-esophageal reflux disease without esophagitis: Secondary | ICD-10-CM | POA: Diagnosis present

## 2019-05-17 DIAGNOSIS — A419 Sepsis, unspecified organism: Secondary | ICD-10-CM | POA: Insufficient documentation

## 2019-05-17 DIAGNOSIS — F0390 Unspecified dementia without behavioral disturbance: Secondary | ICD-10-CM | POA: Diagnosis present

## 2019-05-17 DIAGNOSIS — Z23 Encounter for immunization: Secondary | ICD-10-CM | POA: Diagnosis present

## 2019-05-17 DIAGNOSIS — B961 Klebsiella pneumoniae [K. pneumoniae] as the cause of diseases classified elsewhere: Secondary | ICD-10-CM | POA: Diagnosis present

## 2019-05-17 DIAGNOSIS — N179 Acute kidney failure, unspecified: Secondary | ICD-10-CM | POA: Diagnosis present

## 2019-05-17 DIAGNOSIS — Z20822 Contact with and (suspected) exposure to covid-19: Secondary | ICD-10-CM | POA: Diagnosis present

## 2019-05-17 DIAGNOSIS — E785 Hyperlipidemia, unspecified: Secondary | ICD-10-CM | POA: Diagnosis present

## 2019-05-17 DIAGNOSIS — I4891 Unspecified atrial fibrillation: Secondary | ICD-10-CM | POA: Diagnosis present

## 2019-05-17 DIAGNOSIS — R0902 Hypoxemia: Secondary | ICD-10-CM | POA: Diagnosis not present

## 2019-05-17 DIAGNOSIS — N3000 Acute cystitis without hematuria: Secondary | ICD-10-CM | POA: Diagnosis not present

## 2019-05-17 DIAGNOSIS — D509 Iron deficiency anemia, unspecified: Secondary | ICD-10-CM | POA: Diagnosis present

## 2019-05-17 DIAGNOSIS — I2510 Atherosclerotic heart disease of native coronary artery without angina pectoris: Secondary | ICD-10-CM | POA: Diagnosis present

## 2019-05-17 DIAGNOSIS — Z974 Presence of external hearing-aid: Secondary | ICD-10-CM | POA: Diagnosis not present

## 2019-05-17 DIAGNOSIS — N183 Chronic kidney disease, stage 3 unspecified: Secondary | ICD-10-CM | POA: Diagnosis present

## 2019-05-17 DIAGNOSIS — M1990 Unspecified osteoarthritis, unspecified site: Secondary | ICD-10-CM | POA: Diagnosis present

## 2019-05-17 DIAGNOSIS — W06XXXA Fall from bed, initial encounter: Secondary | ICD-10-CM | POA: Diagnosis present

## 2019-05-17 LAB — URINALYSIS, ROUTINE W REFLEX MICROSCOPIC
Bilirubin Urine: NEGATIVE
Glucose, UA: NEGATIVE mg/dL
Hgb urine dipstick: NEGATIVE
Ketones, ur: NEGATIVE mg/dL
Nitrite: POSITIVE — AB
Protein, ur: 100 mg/dL — AB
Specific Gravity, Urine: 1.018 (ref 1.005–1.030)
WBC, UA: >50 WBC/hpf — ABNORMAL HIGH (ref 0–5)
pH: 5 (ref 5.0–8.0)

## 2019-05-17 LAB — FIBRINOGEN: Fibrinogen: 721 mg/dL — ABNORMAL HIGH (ref 210–475)

## 2019-05-17 LAB — COMPREHENSIVE METABOLIC PANEL
ALT: 33 U/L (ref 0–44)
AST: 51 U/L — ABNORMAL HIGH (ref 15–41)
Albumin: 2.8 g/dL — ABNORMAL LOW (ref 3.5–5.0)
Alkaline Phosphatase: 85 U/L (ref 38–126)
Anion gap: 12 (ref 5–15)
BUN: 33 mg/dL — ABNORMAL HIGH (ref 8–23)
CO2: 17 mmol/L — ABNORMAL LOW (ref 22–32)
Calcium: 8.5 mg/dL — ABNORMAL LOW (ref 8.9–10.3)
Chloride: 108 mmol/L (ref 98–111)
Creatinine, Ser: 1.87 mg/dL — ABNORMAL HIGH (ref 0.61–1.24)
GFR calc Af Amer: 35 mL/min — ABNORMAL LOW (ref >60)
GFR calc non Af Amer: 30 mL/min — ABNORMAL LOW (ref >60)
Glucose, Bld: 121 mg/dL — ABNORMAL HIGH (ref 70–99)
Potassium: 4.5 mmol/L (ref 3.5–5.1)
Sodium: 137 mmol/L (ref 135–145)
Total Bilirubin: 0.3 mg/dL (ref 0.3–1.2)
Total Protein: 7.1 g/dL (ref 6.5–8.1)

## 2019-05-17 LAB — CBC WITH DIFFERENTIAL/PLATELET
Abs Immature Granulocytes: 0.13 10*3/uL — ABNORMAL HIGH (ref 0.00–0.07)
Basophils Absolute: 0.1 10*3/uL (ref 0.0–0.1)
Basophils Relative: 1 %
Eosinophils Absolute: 0.1 10*3/uL (ref 0.0–0.5)
Eosinophils Relative: 1 %
HCT: 33.2 % — ABNORMAL LOW (ref 39.0–52.0)
Hemoglobin: 10.7 g/dL — ABNORMAL LOW (ref 13.0–17.0)
Immature Granulocytes: 1 %
Lymphocytes Relative: 8 %
Lymphs Abs: 1.3 10*3/uL (ref 0.7–4.0)
MCH: 31.7 pg (ref 26.0–34.0)
MCHC: 32.2 g/dL (ref 30.0–36.0)
MCV: 98.2 fL (ref 80.0–100.0)
Monocytes Absolute: 1.7 10*3/uL — ABNORMAL HIGH (ref 0.1–1.0)
Monocytes Relative: 11 %
Neutro Abs: 12.9 10*3/uL — ABNORMAL HIGH (ref 1.7–7.7)
Neutrophils Relative %: 78 %
Platelets: 246 10*3/uL (ref 150–400)
RBC: 3.38 MIL/uL — ABNORMAL LOW (ref 4.22–5.81)
RDW: 14.1 % (ref 11.5–15.5)
WBC: 16.2 10*3/uL — ABNORMAL HIGH (ref 4.0–10.5)
nRBC: 0 % (ref 0.0–0.2)

## 2019-05-17 LAB — FERRITIN: Ferritin: 524 ng/mL — ABNORMAL HIGH (ref 24–336)

## 2019-05-17 LAB — LACTATE DEHYDROGENASE: LDH: 233 U/L — ABNORMAL HIGH (ref 98–192)

## 2019-05-17 LAB — LACTIC ACID, PLASMA
Lactic Acid, Venous: 1 mmol/L (ref 0.5–1.9)
Lactic Acid, Venous: 1.3 mmol/L (ref 0.5–1.9)

## 2019-05-17 LAB — SARS CORONAVIRUS 2 (TAT 6-24 HRS): SARS Coronavirus 2: POSITIVE — AB

## 2019-05-17 LAB — D-DIMER, QUANTITATIVE: D-Dimer, Quant: 2.43 ug/mL-FEU — ABNORMAL HIGH (ref 0.00–0.50)

## 2019-05-17 LAB — PROCALCITONIN: Procalcitonin: 0.5 ng/mL

## 2019-05-17 LAB — C-REACTIVE PROTEIN: CRP: 32.2 mg/dL — ABNORMAL HIGH (ref <1.0)

## 2019-05-17 MED ORDER — VITAMIN C 500 MG PO TABS
500.0000 mg | ORAL_TABLET | Freq: Every day | ORAL | Status: DC
  Administered 2019-05-17: 500 mg via ORAL
  Filled 2019-05-17: qty 1

## 2019-05-17 MED ORDER — ACETAMINOPHEN 325 MG PO TABS
650.0000 mg | ORAL_TABLET | Freq: Four times a day (QID) | ORAL | Status: DC | PRN
  Filled 2019-05-17: qty 2

## 2019-05-17 MED ORDER — SODIUM CHLORIDE 0.9 % IV SOLN
100.0000 mg | Freq: Every day | INTRAVENOUS | Status: AC
  Administered 2019-05-18 – 2019-05-21 (×4): 100 mg via INTRAVENOUS
  Filled 2019-05-17 (×4): qty 20

## 2019-05-17 MED ORDER — SODIUM CHLORIDE 0.9 % IV SOLN
200.0000 mg | Freq: Once | INTRAVENOUS | Status: AC
  Administered 2019-05-17: 08:00:00 200 mg via INTRAVENOUS
  Filled 2019-05-17: qty 40

## 2019-05-17 MED ORDER — SODIUM CHLORIDE 0.9 % IV SOLN
INTRAVENOUS | Status: DC
  Administered 2019-05-17: 05:00:00 via INTRAVENOUS

## 2019-05-17 MED ORDER — SODIUM CHLORIDE 0.9 % IV SOLN
1.0000 g | INTRAVENOUS | Status: DC
  Administered 2019-05-17 – 2019-05-18 (×2): 1 g via INTRAVENOUS
  Filled 2019-05-17: qty 10
  Filled 2019-05-17: qty 1

## 2019-05-17 MED ORDER — SODIUM CHLORIDE 0.9 % IV SOLN
500.0000 mg | INTRAVENOUS | Status: DC
  Administered 2019-05-17 – 2019-05-18 (×2): 500 mg via INTRAVENOUS
  Filled 2019-05-17 (×2): qty 500

## 2019-05-17 MED ORDER — DEXAMETHASONE SODIUM PHOSPHATE 10 MG/ML IJ SOLN
6.0000 mg | INTRAMUSCULAR | Status: DC
  Administered 2019-05-17 – 2019-05-22 (×6): 6 mg via INTRAVENOUS
  Filled 2019-05-17 (×6): qty 1

## 2019-05-17 MED ORDER — ZINC SULFATE 220 (50 ZN) MG PO CAPS
220.0000 mg | ORAL_CAPSULE | Freq: Every day | ORAL | Status: DC
  Administered 2019-05-17 – 2019-05-21 (×5): 220 mg via ORAL
  Filled 2019-05-17 (×7): qty 1

## 2019-05-17 NOTE — Progress Notes (Signed)
PROGRESS NOTE    WINIFRED CAULK  [id number] DOB: 1924/04/17 DOA: 05/20/2019 PCP: Lawerance Cruel, MD    Brief Narrative:  85 y.o. male with medical history significant of CAD status post PCI on Plavix, hyperlipidemia, hypertension, peripheral vascular disease, sleep apnea presented to the ED for evaluation of fall and head injury.  Per EMS, patient has history of dementia and is at baseline per family member who was in the house with the patient.  They reported that he got out of his bed and fell to the ground.  They came into his room after they heard a loud noise and patient was calling out.  He did hit his head. Patient's daughter told ED provider that the family recently flew to [place] and since returning from his trip patient has been feeling ill.  He has had fevers and chills since one day prior to admit.  ED Course: Temperature 100.6 F.  Tachypneic.  Oxygen saturation as low as 90% on room air.  White blood cell count 16.2 with left shift.  Lactic acid normal.  Hemoglobin 10.7, baseline in the 7-8 range.  Bicarb 17, anion gap 12.  Blood glucose 121.  BUN 33, creatinine 1.8.  UA with positive nitrite, moderate amount of leukocytes, greater than 50 WBCs, and many bacteria.  SARS-CoV-2 test pending.  Blood culture x2 pending.  Chest x-ray showing patchy bilateral airspace opacities concerning for multifocal pneumonia.  CT head negative for acute intracranial hemorrhage.  Showing high right frontoparietal scalp swelling without large subjacent hematoma or calvarial fracture.  Age-indeterminate lacune in the right thalamus, new from prior exam but thought to be subacute to chronic.  Additional lacune in the left basal ganglia and left pons are similar to prior.  CT C-spine negative for fracture or traumatic listhesis.  X-ray of left forearm showing no acute bony abnormality.  X-ray of left knee negative for acute bony abnormality.  X-ray of pelvis negative for fracture or diastasis.     Assessment & Plan:   Principal Problem:   Suspected COVID-19 virus infection Active Problems:   Multifocal pneumonia   Sepsis ([place])   UTI (urinary tract infection)   Fall at home, initial encounter    Sepsis and acute hypoxic respiratory failure secondary to confirmed COVID-19 viral multifocal pneumonia -Presented with temperature 100.6 F.  Tachypneic.  Oxygen saturation as low as 90% on room air.  White blood cell count 16.2 with left shift.  Lactic acid normal.  Chest x-ray showing patchy bilateral airspace opacities concerning for multifocal pneumonia.  -Blood pressure remains stable.   -Pt is continued on empiric Ceftriaxone and azithromycin.   -Procalcitonin of 0.5, CRP 32 and Ferritin of 524 -COVID pos -Now on IV Decadron 6 mg daily and remdesivir -Vitamin C and zinc -Cont to follow inflammatory markers including ferritin, fibrinogen, d-dimer, LDH, CRP -Airborne and contact precautions  UTI -UA suggestive of UTI. -Will continue empiric Ceftriaxone -Urine cx pending  Non-anion gap metabolic acidosis -Bicarb 17, anion gap 12.  Not on diuretic -IV fluid hydration -Repeat bmet in AM  Head trauma secondary to a mechanical fall -No loss of consciousness reported by family.   -CT head negative for acute intracranial hemorrhage.  Showing high right frontoparietal scalp swelling without large subjacent hematoma or calvarial fracture.  Age-indeterminate lacune in the right thalamus, new from prior exam but thought to be subacute to chronic.  Additional lacune in the left basal ganglia and left pons are similar to prior.  CT C-spine  negative for fracture or traumatic listhesis. -Holding home Plavix at this time -PT/OT consulted -Fall precautions  AKI on CKD stage III -Likely secondary to presenting dehydration -Continue with gentle IV fluid hydration -Monitor urine output -Repeat BMET in AM  DVT prophylaxis: SCD's Code Status: Full Family Communication: Pt in room,  family not at bedside Disposition Plan: Uncertain at this time  Consultants:     Procedures:     Antimicrobials: Anti-infectives (From admission, onward)   Start     Dose/Rate Route Frequency Ordered Stop   05/18/19 1000  remdesivir 100 mg in sodium chloride 0.9 % 250 mL IVPB     100 mg 500 mL/hr over 30 Minutes Intravenous Daily 05/17/19 0620 Jun 11, 2019 0959   05/17/19 0700  remdesivir 200 mg in sodium chloride 0.9 % 250 mL IVPB     200 mg 500 mL/hr over 30 Minutes Intravenous Once 05/17/19 0620 05/17/19 1008   05/17/19 0230  cefTRIAXone (ROCEPHIN) 1 g in sodium chloride 0.9 % 100 mL IVPB     1 g 200 mL/hr over 30 Minutes Intravenous Every 24 hours 05/17/19 0225     05/17/19 0230  azithromycin (ZITHROMAX) 500 mg in sodium chloride 0.9 % 250 mL IVPB     500 mg 250 mL/hr over 60 Minutes Intravenous Every 24 hours 05/17/19 0225         Subjective: Unable to assess given current mentation  Objective: Vitals:   05/17/19 0500 05/17/19 0606 05/17/19 0700 05/17/19 0745  BP:  129/61 (!) 157/54   Pulse: 79 66 73   Resp: (!) 22 19 (!) 21   Temp:      TempSrc:      SpO2: 96% 95% (!) 89% 97%    Intake/Output Summary (Last 24 hours) at 05/17/2019 1422 Last data filed at 05/17/2019 1100 Gross per 24 hour  Intake 250 ml  Output 100 ml  Net 150 ml   There were no vitals filed for this visit.  Examination:  General exam: Appears calm and comfortable  Respiratory system: Clear to auscultation. Respiratory effort normal. Cardiovascular system: S1 & S2 heard, RRR Gastrointestinal system: Abdomen is nondistended, soft and nontender. No organomegaly or masses felt. Normal bowel sounds heard. Central nervous system: No tremors. No focal neurological deficits. Extremities: Symmetric 5 x 5 power. Skin: No rashes, lesions Psychiatry: Unable to assess given current mentation.   Data Reviewed: I have personally reviewed following labs and imaging studies  CBC: Recent Labs  Lab  05/17/2019 2253  WBC 16.2*  NEUTROABS 12.9*  HGB 10.7*  HCT 33.2*  MCV 98.2  PLT 0000000   Basic Metabolic Panel: Recent Labs  Lab 06/08/2019 2253  NA 137  K 4.5  CL 108  CO2 17*  GLUCOSE 121*  BUN 33*  CREATININE 1.87*  CALCIUM 8.5*   GFR: CrCl cannot be calculated (Unknown ideal weight.). Liver Function Tests: Recent Labs  Lab 06/11/2019 2253  AST 51*  ALT 33  ALKPHOS 85  BILITOT 0.3  PROT 7.1  ALBUMIN 2.8*   No results for input(s): LIPASE, AMYLASE in the last 168 hours. No results for input(s): AMMONIA in the last 168 hours. Coagulation Profile: No results for input(s): INR, PROTIME in the last 168 hours. Cardiac Enzymes: No results for input(s): CKTOTAL, CKMB, CKMBINDEX, TROPONINI in the last 168 hours. BNP (last 3 results) No results for input(s): PROBNP in the last 8760 hours. HbA1C: No results for input(s): HGBA1C in the last 72 hours. CBG: No results for input(s): GLUCAP  in the last 168 hours. Lipid Profile: No results for input(s): CHOL, HDL, LDLCALC, TRIG, CHOLHDL, LDLDIRECT in the last 72 hours. Thyroid Function Tests: No results for input(s): TSH, T4TOTAL, FREET4, T3FREE, THYROIDAB in the last 72 hours. Anemia Panel: Recent Labs    05/17/19 0302  FERRITIN 524*   Sepsis Labs: Recent Labs  Lab 05/17/19 0016 05/17/19 0156 05/17/19 0302  PROCALCITON  --   --  0.50  LATICACIDVEN 1.3 1.0  --     Recent Results (from the past 240 hour(s))  SARS CORONAVIRUS 2 (TAT 6-24 HRS) Nasopharyngeal Nasopharyngeal Swab     Status: Abnormal   Collection Time: 05/25/2019 11:53 PM   Specimen: Nasopharyngeal Swab  Result Value Ref Range Status   SARS Coronavirus 2 POSITIVE (A) NEGATIVE Final    Comment: RESULT CALLED TO, READ BACK BY AND VERIFIED WITH: Berta Minor, RN AT [phone] ON 05/17/2019 BY SAINVILUS S (NOTE) SARS-CoV-2 target nucleic acids are DETECTED. The SARS-CoV-2 RNA is generally detectable in upper and lower respiratory specimens during the acute phase  of infection. Positive results are indicative of active infection with SARS-CoV-2. Clinical  correlation with patient history and other diagnostic information is necessary to determine patient infection status. Positive results do  not rule out bacterial infection or co-infection with other viruses. The expected result is Negative. Fact Sheet for Patients: SugarRoll.be Fact Sheet for Healthcare Providers: https://www.woods-mathews.com/ This test is not yet approved or cleared by the [place] FDA and  has been authorized for detection and/or diagnosis of SARS-CoV-2 by FDA under an Emergency Use Authorization (EUA). This EUA will remain  in effect (meaning this test c an be used) for the duration of the COVID-19 declaration under Section 564(b)(1) of the Act, 21 U.S.C. section 360bbb-3(b)(1), unless the authorization is terminated or revoked sooner. Performed at [facility], [place] [address], [place], [place] [address]      Radiology Studies: Dg Forearm Left  Result Date: 05/25/2019 CLINICAL DATA:  Fall, skin tears on left forearm EXAM: LEFT FOREARM - 2 VIEW COMPARISON:  10/14/2017 FINDINGS: No acute bony abnormality. Specifically, no fracture, subluxation, or dislocation. Soft tissue laceration noted in the distal forearm. No radiopaque foreign body. IMPRESSION: No acute bony abnormality. Electronically Signed   By: Rolm Baptise M.D.   On: 05/28/2019 23:26   Ct Head Wo Contrast  Result Date: 05/20/2019 CLINICAL DATA:  Fall, positive head strike EXAM: CT HEAD WITHOUT CONTRAST CT CERVICAL SPINE WITHOUT CONTRAST TECHNIQUE: Multidetector CT imaging of the head and cervical spine was performed following the standard protocol without intravenous contrast. Multiplanar CT image reconstructions of the cervical spine were also generated. COMPARISON:  CT December 12, 2018 FINDINGS: CT HEAD FINDINGS Brain: Stable gliosis in the right occipital lobe  compatible remote infarct. Punctate lacune in the right thalamus is new since comparison study but has an overall subacute to chronic appearance. Additional lacunar type infarcts in the left basal ganglia and left pons are similar to prior. No acute hemorrhage or extra-axial collection. No mass effect or midline shift. Patchy and more confluent areas of white matter hypoattenuation are most compatible with chronic microvascular angiopathy. Symmetric prominence of the ventricles, cisterns and sulci compatible with moderate parenchymal volume loss. Vascular: Atherosclerotic calcification of the carotid siphons. No hyperdense vessel. Skull: High right frontoparietal scalp swelling without large subjacent hematoma or calvarial fracture. No suspicious osseous lesions. Sinuses/Orbits: Mild mural thickening of the paranasal sinuses. No air-fluid levels are seen. Bilateral lens extractions and senescent scleral plaques  are present. A occluded orbits are otherwise unremarkable. Other: None CT CERVICAL SPINE FINDINGS Alignment: There is slight exaggeration of the thoracic kyphosis. Stepwise anterolisthesis C2-C5, mild retrolisthesis C5 on C6, minimal anterolisthesis C6 on C7. These features are likely on a degenerative basis with stability from prior study. Craniocervical and atlantoaxial articulations are maintained albeit with significant arthrosis and calcific pannus formation at the dens. No abnormal facet widening. Skull base and vertebrae: Bony fusion across the right third through fifth articular facets and the left third and fourth articular facets. Calcific pannus formation posterior to the dens may reflect calcium pyrophosphate deposition. No acute fracture. No primary bone lesion or focal pathologic process. Soft tissues and spinal canal: No pre or paravertebral fluid or swelling. No visible canal hematoma. Disc levels: Multilevel intervertebral disc height loss with spondylitic endplate changes. Features are most  pronounced at the C5-6 level with mild canal stenosis and at least moderate bilateral foraminal narrowing. Additional mild to moderate multilevel foraminal narrowing is noted elsewhere in the included cervical spine. Upper chest: Pleuroparenchymal scarring in the apices with stable calcified apical pleural plaques. Other: Extensive vascular calcifications of the cervical vessels. Normal thyroid. IMPRESSION: 1. No acute intracranial hemorrhage or extra-axial collection. 2. Age-indeterminate lacune in the right thalamus, new from prior exam though favor subacute to chronic. 3. Additional lacune in the left basal ganglia and left pons are similar to prior. 4. Extensive microvascular angiopathic changes and moderate parenchymal volume loss, similar to prior. 5. High right frontoparietal scalp swelling without large subjacent hematoma or calvarial fracture. 6. No acute cervical spine fracture or traumatic listhesis. 7. Cervical spondylitic changes maximal C5-6, as above 8. Intracranial and cervical carotid atherosclerosis. Electronically Signed   By: Lovena Le M.D.   On: 05/25/2019 23:48   Ct Cervical Spine Wo Contrast  Result Date: 05/25/2019 CLINICAL DATA:  Fall, positive head strike EXAM: CT HEAD WITHOUT CONTRAST CT CERVICAL SPINE WITHOUT CONTRAST TECHNIQUE: Multidetector CT imaging of the head and cervical spine was performed following the standard protocol without intravenous contrast. Multiplanar CT image reconstructions of the cervical spine were also generated. COMPARISON:  CT December 12, 2018 FINDINGS: CT HEAD FINDINGS Brain: Stable gliosis in the right occipital lobe compatible remote infarct. Punctate lacune in the right thalamus is new since comparison study but has an overall subacute to chronic appearance. Additional lacunar type infarcts in the left basal ganglia and left pons are similar to prior. No acute hemorrhage or extra-axial collection. No mass effect or midline shift. Patchy and more confluent  areas of white matter hypoattenuation are most compatible with chronic microvascular angiopathy. Symmetric prominence of the ventricles, cisterns and sulci compatible with moderate parenchymal volume loss. Vascular: Atherosclerotic calcification of the carotid siphons. No hyperdense vessel. Skull: High right frontoparietal scalp swelling without large subjacent hematoma or calvarial fracture. No suspicious osseous lesions. Sinuses/Orbits: Mild mural thickening of the paranasal sinuses. No air-fluid levels are seen. Bilateral lens extractions and senescent scleral plaques are present. A occluded orbits are otherwise unremarkable. Other: None CT CERVICAL SPINE FINDINGS Alignment: There is slight exaggeration of the thoracic kyphosis. Stepwise anterolisthesis C2-C5, mild retrolisthesis C5 on C6, minimal anterolisthesis C6 on C7. These features are likely on a degenerative basis with stability from prior study. Craniocervical and atlantoaxial articulations are maintained albeit with significant arthrosis and calcific pannus formation at the dens. No abnormal facet widening. Skull base and vertebrae: Bony fusion across the right third through fifth articular facets and the left third and fourth articular facets. Calcific  pannus formation posterior to the dens may reflect calcium pyrophosphate deposition. No acute fracture. No primary bone lesion or focal pathologic process. Soft tissues and spinal canal: No pre or paravertebral fluid or swelling. No visible canal hematoma. Disc levels: Multilevel intervertebral disc height loss with spondylitic endplate changes. Features are most pronounced at the C5-6 level with mild canal stenosis and at least moderate bilateral foraminal narrowing. Additional mild to moderate multilevel foraminal narrowing is noted elsewhere in the included cervical spine. Upper chest: Pleuroparenchymal scarring in the apices with stable calcified apical pleural plaques. Other: Extensive vascular  calcifications of the cervical vessels. Normal thyroid. IMPRESSION: 1. No acute intracranial hemorrhage or extra-axial collection. 2. Age-indeterminate lacune in the right thalamus, new from prior exam though favor subacute to chronic. 3. Additional lacune in the left basal ganglia and left pons are similar to prior. 4. Extensive microvascular angiopathic changes and moderate parenchymal volume loss, similar to prior. 5. High right frontoparietal scalp swelling without large subjacent hematoma or calvarial fracture. 6. No acute cervical spine fracture or traumatic listhesis. 7. Cervical spondylitic changes maximal C5-6, as above 8. Intracranial and cervical carotid atherosclerosis. Electronically Signed   By: Lovena Le M.D.   On: 05/15/2019 23:48   Dg Pelvis Portable  Result Date: 06/01/2019 CLINICAL DATA:  Fall EXAM: PORTABLE PELVIS 1-2 VIEWS COMPARISON:  CT abdomen pelvis December 12, 2018 FINDINGS: No evidence of acute pelvic fracture or diastasis. The femoral heads are normally located. Moderate degenerative changes are noted in lower lumbar spine. Additional degenerative features are present in both SI joints and the bilateral hips, left slightly greater than right which is similar to comparison CT from December 12, 2018. No suspicious osseous lesions. Bowel gas pattern is unremarkable. Vascular calcium noted in the soft tissues. Soft tissues are otherwise unremarkable. IMPRESSION: 1. No acute pelvic fracture or diastasis. 2. Stable degenerative changes in the pelvis and bilateral hips, left slightly greater than right. Electronically Signed   By: Lovena Le M.D.   On: 06/11/2019 23:34   Dg Chest Portable 1 View  Result Date: 05/21/2019 CLINICAL DATA:  Fall.  Possible COVID. EXAM: PORTABLE CHEST 1 VIEW COMPARISON:  12/12/2018 FINDINGS: Patchy airspace disease noted in both lungs, most prominent peripherally and in the right mid lung. Findings concerning for multifocal pneumonia, possibly COVID pneumonia.  Cardiomegaly. No effusions or pneumothorax. No acute bony abnormality. IMPRESSION: Patchy bilateral airspace opacities concerning for multifocal pneumonia, possibly COVID pneumonia. Cardiomegaly. Electronically Signed   By: Rolm Baptise M.D.   On: 05/19/2019 23:26   Dg Knee Left Port  Result Date: 06/06/2019 CLINICAL DATA:  Fall EXAM: PORTABLE LEFT KNEE - 1-2 VIEW COMPARISON:  None. FINDINGS: Joint space narrowing throughout the left knee. No joint effusion. No acute bony abnormality. Specifically, no fracture, subluxation, or dislocation. IMPRESSION: Early degenerative changes.  No acute bony abnormality. Electronically Signed   By: Rolm Baptise M.D.   On: 05/30/2019 23:35   Ct Maxillofacial Wo Contrast  Result Date: 05/17/2019 CLINICAL DATA:  Facial trauma with fracture suspected. EXAM: CT MAXILLOFACIAL WITHOUT CONTRAST TECHNIQUE: Multidetector CT imaging of the maxillofacial structures was performed. Multiplanar CT image reconstructions were also generated. COMPARISON:  None. FINDINGS: Osseous: No fracture or mandibular dislocation. No destructive process. Orbits: Negative. No traumatic or inflammatory finding. Sinuses: There is pansinus mucosal thickening. The visualized portions of the mastoid air cells are clear. Soft tissues: There is mild frontal scalp swelling. Limited intracranial: No definite acute intracranial abnormality detected involving the visualized portions of the  brain. Advanced microvascular ischemic changes are noted. IMPRESSION: No displaced fracture. Electronically Signed   By: Constance Holster M.D.   On: 05/17/2019 01:07    Scheduled Meds: . dexamethasone (DECADRON) injection  6 mg Intravenous Q24H  . vitamin C  500 mg Oral Daily  . zinc sulfate  220 mg Oral Daily   Continuous Infusions: . azithromycin Stopped (05/17/19 0542)  . cefTRIAXone (ROCEPHIN)  IV Stopped (05/17/19 0405)  . [START ON 05/18/2019] remdesivir 100 mg in NS 250 mL       LOS: 0 days   Marylu Lund,  MD [facility] Pager On Amion  If 7PM-7AM, please contact night-coverage 05/17/2019, 2:22 PM

## 2019-05-17 NOTE — H&P (Addendum)
History and Physical    Larry Huffman [id number] DOB: 1923-07-13 DOA: 06/03/2019  PCP: Larry Cruel, MD Patient coming from: Home  Chief Complaint: Fall, head injury  HPI: Larry Huffman is a 85 y.o. male with medical history significant of CAD status post PCI on Plavix, hyperlipidemia, hypertension, peripheral vascular disease, sleep apnea presented to the ED for evaluation of fall and head injury.  Per EMS, patient has history of dementia and is at baseline per family member who was in the house with the patient.  They reported that he got out of his bed and fell to the ground.  They came into his room after they heard a loud noise and patient was calling out.  He did hit his head. Patient's daughter told ED provider that the family recently flew to [place] and since returning from his trip patient has been feeling ill.  He has had fevers and chills since yesterday.  It was difficult to obtain any history from the patient as he is hard of hearing.  He responded to every question by saying "I don't know."  ED Course: Temperature 100.6 F.  Tachypneic.  Oxygen saturation as low as 90% on room air.  White blood cell count 16.2 with left shift.  Lactic acid normal.  Hemoglobin 10.7, baseline in the 7-8 range.  Bicarb 17, anion gap 12.  Blood glucose 121.  BUN 33, creatinine 1.8.  UA with positive nitrite, moderate amount of leukocytes, greater than 50 WBCs, and many bacteria.  SARS-CoV-2 test pending.  Blood culture x2 pending.  Chest x-ray showing patchy bilateral airspace opacities concerning for multifocal pneumonia.  CT head negative for acute intracranial hemorrhage.  Showing high right frontoparietal scalp swelling without large subjacent hematoma or calvarial fracture.  Age-indeterminate lacune in the right thalamus, new from prior exam but thought to be subacute to chronic.  Additional lacune in the left basal ganglia and left pons are similar to prior.  CT C-spine negative for fracture or  traumatic listhesis.  X-ray of left forearm showing no acute bony abnormality.  X-ray of left knee negative for acute bony abnormality.  X-ray of pelvis negative for fracture or diastasis.   CT maxillofacial pending.  Review of Systems:  All systems reviewed and apart from history of presenting illness, are negative.  Past Medical History:  Diagnosis Date  . Acid reflux   . Arthritis   . CAD (coronary artery disease)   . Cancer (HCC) hx of skin cancer  . Constipation   . Coronary artery disease   . High cholesterol   . HOH (hard of hearing)    wears hearing aids, but still can not hear  . Hypertension   . Incontinence of urine   . Peripheral vascular disease ([place])    Remote left carotid endarterectomy  . Sleep apnea    CPAP    Past Surgical History:  Procedure Laterality Date  . 2-D echocardiogram  03/07/2008   Ejection fraction greater than 55%. Mild concentric left ventricular hypertrophy.LV relaxation. mildly dilated left atrium. Mild mitral regurgitation.   . ANAL FISSURE REPAIR    . CARDIAC CATHETERIZATION     X 2 stents  . COLON SURGERY     removed part of the small intestine  . COLONOSCOPY W/ POLYPECTOMY    . CORONARY STENT PLACEMENT    . ESOPHAGOGASTRODUODENOSCOPY N/A 01/16/2015   Procedure: ESOPHAGOGASTRODUODENOSCOPY (EGD);  Surgeon: Irene Shipper, MD;  Location: [facility] END[facility];  Service: Endoscopy;  Laterality: N/A;  .  EYE SURGERY     cataracts  . I&D EXTREMITY  06/28/2012   Procedure: IRRIGATION AND DEBRIDEMENT EXTREMITY;  Surgeon: Newt Minion, MD;  Location: [place];  Service: Orthopedics;  Laterality: Right;  Debridement Wound Right Leg, VAC, Antibiotic Beads, Apply A-Cell  . IRRIGATION AND DEBRIDEMENT ABSCESS    . LAPAROTOMY    . LUMBAR LAMINECTOMY N/A 02/08/2014   Procedure: L3-4, L4-5 Decompression;  Surgeon: Marybelle Killings, MD;  Location: [facility];  Service: Orthopedics;  Laterality: N/A;  L3-4, L4-5 Decompression  . NASAL SINUS SURGERY    . PROSTATE SURGERY    .  ROTATOR CUFF REPAIR       reports that he quit smoking about 40 years ago. His smoking use included cigarettes. He has never used smokeless tobacco. He reports that he does not drink alcohol or use drugs.  Allergies  Allergen Reactions  . Ciprofloxacin Rash  . Tape Other (See Comments)    Pulls skin off.  Please use "paper tape" only.   . Dapsone Other (See Comments)    Lowers WBC count?? "made me very sick"    Family History  Problem Relation Age of Onset  . Hypertension Mother     Prior to Admission medications   Medication Sig Start Date End Date Taking? Authorizing Provider  acetaminophen (TYLENOL) 500 MG tablet Take 1,000 mg by mouth 2 (two) times daily as needed for mild pain.     [provider]  amLODipine (NORVASC) 10 MG tablet Take 10 mg by mouth daily.    [provider]  Ascorbic Acid (VITAMIN C) 1000 MG tablet Take 1,000 mg by mouth daily. 07/01/10   [provider]  aspirin EC 81 MG tablet Take 81 mg by mouth at bedtime.     [provider]  clopidogrel (PLAVIX) 75 MG tablet Take 1 tablet (75 mg total) by mouth daily. 03/26/16   Lorretta Harp, MD  docusate sodium (COLACE) 100 MG capsule Take 100 mg by mouth as needed for constipation.    [provider]  doxepin (SINEQUAN) 25 MG capsule Take 25 mg by mouth 2 (two) times a day. 12/02/18   [provider]  ferrous sulfate 325 (65 FE) MG tablet Take 650 mg by mouth daily with breakfast.     [provider]  loratadine (CLARITIN) 10 MG tablet Take 5 mg by mouth daily.    [provider]  methocarbamol (ROBAXIN) 500 MG tablet Take 1 tablet (500 mg total) by mouth every 6 (six) hours as needed for muscle spasms. 12/22/18   Rayburn, Claiborne Billings A, PA-C  montelukast (SINGULAIR) 10 MG tablet Take 10 mg by mouth at bedtime.    [provider]  Multiple Vitamins-Minerals (PRESERVISION AREDS 2 PO) Take 1 tablet by mouth daily.    [provider]   Multiple Vitamins-Minerals (PX COMPLETE SENIOR MULTIVITS PO) Take 1 tablet by mouth every morning.    [provider]  nitroGLYCERIN (NITROSTAT) 0.4 MG SL tablet Place 1 tablet (0.4 mg total) under the tongue every 5 (five) minutes as needed for chest pain. 09/25/15   Lorretta Harp, MD  Omega-3 Fatty Acids (FISH OIL) 1200 MG CAPS Take 1,200 mg by mouth daily.     [provider]  pantoprazole (PROTONIX) 40 MG tablet Take 1 tablet (40 mg total) by mouth daily. 04/02/16   Lorretta Harp, MD  polyethylene glycol [facility] Flor[facility] Raveling) packet Take 17 g by mouth as needed (constipation).  [provider]  Probiotic Product (PROBIOTIC DAILY PO) Take 1 tablet by mouth every morning.    [provider]  pyridOXINE (VITAMIN B-6) 100 MG tablet Take 100 mg by mouth daily.     [provider]  simvastatin (ZOCOR) 10 MG tablet Take 1 tablet (10 mg total) by mouth at bedtime. 04/02/16   Lorretta Harp, MD  triamcinolone cream (KENALOG) 0.1 % Apply 1 application topically 2 (two) times daily as needed for irritation. 11/29/18   [provider]    Physical Exam: Vitals:   05/17/19 0156 05/17/19 0200 05/17/19 0230 05/17/19 0400  BP:  (!) 141/51 (!) 139/53 (!) 112/44  Pulse:  66 67 68  Resp:  (!) 27 (!) 28 (!) 21  Temp: 98.3 F (36.8 C)     TempSrc: Oral     SpO2:  99% 96% 95%    Physical Exam  Constitutional: He is oriented to person, place, and time. No distress.  HENT:  Head: Normocephalic.  Eyes: Right eye exhibits no discharge. Left eye exhibits no discharge.  Neck: Neck supple.  Cardiovascular: Normal rate, regular rhythm and intact distal pulses.  Pulmonary/Chest: Effort normal. No respiratory distress. He has no wheezes.  Coarse breath sounds appreciated at the bases  Abdominal: Soft. Bowel sounds are normal. He exhibits no distension. There is no abdominal tenderness. There is no guarding.  Musculoskeletal:        General: No  edema.  Neurological: He is alert and oriented to person, place, and time.  Skin: Skin is warm and dry. He is not diaphoretic.  Mild skin tears in several places     Labs on Admission: I have personally reviewed following labs and imaging studies  CBC: Recent Labs  Lab 06/02/2019 2253  WBC 16.2*  NEUTROABS 12.9*  HGB 10.7*  HCT 33.2*  MCV 98.2  PLT 0000000   Basic Metabolic Panel: Recent Labs  Lab 06/05/2019 2253  NA 137  K 4.5  CL 108  CO2 17*  GLUCOSE 121*  BUN 33*  CREATININE 1.87*  CALCIUM 8.5*   GFR: CrCl cannot be calculated (Unknown ideal weight.). Liver Function Tests: Recent Labs  Lab 06/08/2019 2253  AST 51*  ALT 33  ALKPHOS 85  BILITOT 0.3  PROT 7.1  ALBUMIN 2.8*   No results for input(s): LIPASE, AMYLASE in the last 168 hours. No results for input(s): AMMONIA in the last 168 hours. Coagulation Profile: No results for input(s): INR, PROTIME in the last 168 hours. Cardiac Enzymes: No results for input(s): CKTOTAL, CKMB, CKMBINDEX, TROPONINI in the last 168 hours. BNP (last 3 results) No results for input(s): PROBNP in the last 8760 hours. HbA1C: No results for input(s): HGBA1C in the last 72 hours. CBG: No results for input(s): GLUCAP in the last 168 hours. Lipid Profile: No results for input(s): CHOL, HDL, LDLCALC, TRIG, CHOLHDL, LDLDIRECT in the last 72 hours. Thyroid Function Tests: No results for input(s): TSH, T4TOTAL, FREET4, T3FREE, THYROIDAB in the last 72 hours. Anemia Panel: No results for input(s): VITAMINB12, FOLATE, FERRITIN, TIBC, IRON, RETICCTPCT in the last 72 hours. Urine analysis:    Component Value Date/Time   COLORURINE YELLOW 05/19/2019 0022   APPEARANCEUR HAZY (A) 06/01/2019 0022   LABSPEC 1.018 05/20/2019 0022   PHURINE 5.0 05/29/2019 0022   GLUCOSEU NEGATIVE 06/05/2019 0022   HGBUR NEGATIVE 06/02/2019 0022   BILIRUBINUR NEGATIVE 06/10/2019 0022   KETONESUR NEGATIVE 05/14/2019 0022   PROTEINUR 100 (A) 05/17/2019 0022    UROBILINOGEN  0.2 01/16/2015 0845   NITRITE POSITIVE (A) 06/08/2019 0022   LEUKOCYTESUR MODERATE (A) 05/15/2019 0022    Radiological Exams on Admission: Dg Forearm Left  Result Date: 05/19/2019 CLINICAL DATA:  Fall, skin tears on left forearm EXAM: LEFT FOREARM - 2 VIEW COMPARISON:  10/14/2017 FINDINGS: No acute bony abnormality. Specifically, no fracture, subluxation, or dislocation. Soft tissue laceration noted in the distal forearm. No radiopaque foreign body. IMPRESSION: No acute bony abnormality. Electronically Signed   By: Rolm Baptise M.D.   On: 05/25/2019 23:26   Ct Head Wo Contrast  Result Date: 06/10/2019 CLINICAL DATA:  Fall, positive head strike EXAM: CT HEAD WITHOUT CONTRAST CT CERVICAL SPINE WITHOUT CONTRAST TECHNIQUE: Multidetector CT imaging of the head and cervical spine was performed following the standard protocol without intravenous contrast. Multiplanar CT image reconstructions of the cervical spine were also generated. COMPARISON:  CT December 12, 2018 FINDINGS: CT HEAD FINDINGS Brain: Stable gliosis in the right occipital lobe compatible remote infarct. Punctate lacune in the right thalamus is new since comparison study but has an overall subacute to chronic appearance. Additional lacunar type infarcts in the left basal ganglia and left pons are similar to prior. No acute hemorrhage or extra-axial collection. No mass effect or midline shift. Patchy and more confluent areas of white matter hypoattenuation are most compatible with chronic microvascular angiopathy. Symmetric prominence of the ventricles, cisterns and sulci compatible with moderate parenchymal volume loss. Vascular: Atherosclerotic calcification of the carotid siphons. No hyperdense vessel. Skull: High right frontoparietal scalp swelling without large subjacent hematoma or calvarial fracture. No suspicious osseous lesions. Sinuses/Orbits: Mild mural thickening of the paranasal sinuses. No air-fluid levels are seen. Bilateral  lens extractions and senescent scleral plaques are present. A occluded orbits are otherwise unremarkable. Other: None CT CERVICAL SPINE FINDINGS Alignment: There is slight exaggeration of the thoracic kyphosis. Stepwise anterolisthesis C2-C5, mild retrolisthesis C5 on C6, minimal anterolisthesis C6 on C7. These features are likely on a degenerative basis with stability from prior study. Craniocervical and atlantoaxial articulations are maintained albeit with significant arthrosis and calcific pannus formation at the dens. No abnormal facet widening. Skull base and vertebrae: Bony fusion across the right third through fifth articular facets and the left third and fourth articular facets. Calcific pannus formation posterior to the dens may reflect calcium pyrophosphate deposition. No acute fracture. No primary bone lesion or focal pathologic process. Soft tissues and spinal canal: No pre or paravertebral fluid or swelling. No visible canal hematoma. Disc levels: Multilevel intervertebral disc height loss with spondylitic endplate changes. Features are most pronounced at the C5-6 level with mild canal stenosis and at least moderate bilateral foraminal narrowing. Additional mild to moderate multilevel foraminal narrowing is noted elsewhere in the included cervical spine. Upper chest: Pleuroparenchymal scarring in the apices with stable calcified apical pleural plaques. Other: Extensive vascular calcifications of the cervical vessels. Normal thyroid. IMPRESSION: 1. No acute intracranial hemorrhage or extra-axial collection. 2. Age-indeterminate lacune in the right thalamus, new from prior exam though favor subacute to chronic. 3. Additional lacune in the left basal ganglia and left pons are similar to prior. 4. Extensive microvascular angiopathic changes and moderate parenchymal volume loss, similar to prior. 5. High right frontoparietal scalp swelling without large subjacent hematoma or calvarial fracture. 6. No acute  cervical spine fracture or traumatic listhesis. 7. Cervical spondylitic changes maximal C5-6, as above 8. Intracranial and cervical carotid atherosclerosis. Electronically Signed   By: Lovena Le M.D.   On: 05/25/2019 23:48   Ct  Cervical Spine Wo Contrast  Result Date: 06/06/2019 CLINICAL DATA:  Fall, positive head strike EXAM: CT HEAD WITHOUT CONTRAST CT CERVICAL SPINE WITHOUT CONTRAST TECHNIQUE: Multidetector CT imaging of the head and cervical spine was performed following the standard protocol without intravenous contrast. Multiplanar CT image reconstructions of the cervical spine were also generated. COMPARISON:  CT December 12, 2018 FINDINGS: CT HEAD FINDINGS Brain: Stable gliosis in the right occipital lobe compatible remote infarct. Punctate lacune in the right thalamus is new since comparison study but has an overall subacute to chronic appearance. Additional lacunar type infarcts in the left basal ganglia and left pons are similar to prior. No acute hemorrhage or extra-axial collection. No mass effect or midline shift. Patchy and more confluent areas of white matter hypoattenuation are most compatible with chronic microvascular angiopathy. Symmetric prominence of the ventricles, cisterns and sulci compatible with moderate parenchymal volume loss. Vascular: Atherosclerotic calcification of the carotid siphons. No hyperdense vessel. Skull: High right frontoparietal scalp swelling without large subjacent hematoma or calvarial fracture. No suspicious osseous lesions. Sinuses/Orbits: Mild mural thickening of the paranasal sinuses. No air-fluid levels are seen. Bilateral lens extractions and senescent scleral plaques are present. A occluded orbits are otherwise unremarkable. Other: None CT CERVICAL SPINE FINDINGS Alignment: There is slight exaggeration of the thoracic kyphosis. Stepwise anterolisthesis C2-C5, mild retrolisthesis C5 on C6, minimal anterolisthesis C6 on C7. These features are likely on a  degenerative basis with stability from prior study. Craniocervical and atlantoaxial articulations are maintained albeit with significant arthrosis and calcific pannus formation at the dens. No abnormal facet widening. Skull base and vertebrae: Bony fusion across the right third through fifth articular facets and the left third and fourth articular facets. Calcific pannus formation posterior to the dens may reflect calcium pyrophosphate deposition. No acute fracture. No primary bone lesion or focal pathologic process. Soft tissues and spinal canal: No pre or paravertebral fluid or swelling. No visible canal hematoma. Disc levels: Multilevel intervertebral disc height loss with spondylitic endplate changes. Features are most pronounced at the C5-6 level with mild canal stenosis and at least moderate bilateral foraminal narrowing. Additional mild to moderate multilevel foraminal narrowing is noted elsewhere in the included cervical spine. Upper chest: Pleuroparenchymal scarring in the apices with stable calcified apical pleural plaques. Other: Extensive vascular calcifications of the cervical vessels. Normal thyroid. IMPRESSION: 1. No acute intracranial hemorrhage or extra-axial collection. 2. Age-indeterminate lacune in the right thalamus, new from prior exam though favor subacute to chronic. 3. Additional lacune in the left basal ganglia and left pons are similar to prior. 4. Extensive microvascular angiopathic changes and moderate parenchymal volume loss, similar to prior. 5. High right frontoparietal scalp swelling without large subjacent hematoma or calvarial fracture. 6. No acute cervical spine fracture or traumatic listhesis. 7. Cervical spondylitic changes maximal C5-6, as above 8. Intracranial and cervical carotid atherosclerosis. Electronically Signed   By: Lovena Le M.D.   On: 06/08/2019 23:48   Dg Pelvis Portable  Result Date: 06/10/2019 CLINICAL DATA:  Fall EXAM: PORTABLE PELVIS 1-2 VIEWS  COMPARISON:  CT abdomen pelvis December 12, 2018 FINDINGS: No evidence of acute pelvic fracture or diastasis. The femoral heads are normally located. Moderate degenerative changes are noted in lower lumbar spine. Additional degenerative features are present in both SI joints and the bilateral hips, left slightly greater than right which is similar to comparison CT from December 12, 2018. No suspicious osseous lesions. Bowel gas pattern is unremarkable. Vascular calcium noted in the soft tissues. Soft tissues  are otherwise unremarkable. IMPRESSION: 1. No acute pelvic fracture or diastasis. 2. Stable degenerative changes in the pelvis and bilateral hips, left slightly greater than right. Electronically Signed   By: Lovena Le M.D.   On: 06/09/2019 23:34   Dg Chest Portable 1 View  Result Date: 05/18/2019 CLINICAL DATA:  Fall.  Possible COVID. EXAM: PORTABLE CHEST 1 VIEW COMPARISON:  12/12/2018 FINDINGS: Patchy airspace disease noted in both lungs, most prominent peripherally and in the right mid lung. Findings concerning for multifocal pneumonia, possibly COVID pneumonia. Cardiomegaly. No effusions or pneumothorax. No acute bony abnormality. IMPRESSION: Patchy bilateral airspace opacities concerning for multifocal pneumonia, possibly COVID pneumonia. Cardiomegaly. Electronically Signed   By: Rolm Baptise M.D.   On: 05/29/2019 23:26   Dg Knee Left Port  Result Date: 05/17/2019 CLINICAL DATA:  Fall EXAM: PORTABLE LEFT KNEE - 1-2 VIEW COMPARISON:  None. FINDINGS: Joint space narrowing throughout the left knee. No joint effusion. No acute bony abnormality. Specifically, no fracture, subluxation, or dislocation. IMPRESSION: Early degenerative changes.  No acute bony abnormality. Electronically Signed   By: Rolm Baptise M.D.   On: 05/21/2019 23:35    EKG: Independently reviewed.  Sinus rhythm, artifact.  Assessment/Plan Principal Problem:   Suspected COVID-19 virus infection Active Problems:   Multifocal  pneumonia   Sepsis ([place])   UTI (urinary tract infection)   Fall at home, initial encounter   Sepsis and acute hypoxic respiratory failure secondary to suspected COVID-19 viral multifocal pneumonia Temperature 100.6 F.  Tachypneic.  Oxygen saturation as low as 90% on room air.  White blood cell count 16.2 with left shift.  Lactic acid normal.  Chest x-ray showing patchy bilateral airspace opacities concerning for multifocal pneumonia.  -Blood pressure stable.  Will give gentle IV fluid hydration.  Goal is to maintain net negative fluid balance given suspicion for Covid pneumonia. -Ceftriaxone and azithromycin.  Check procalcitonin level. -IV Decadron 6 mg daily -Tylenol as needed -SARS-CoV-2 test pending.  If positive, start patient on remdesivir and transferred to [facility]. -Vitamin C and zinc -Check inflammatory markers including ferritin, fibrinogen, d-dimer, LDH, CRP -Airborne and contact precautions -Continuous pulse ox -Supplemental oxygen as needed to keep oxygen saturation above 92% -Continue to monitor white blood cell count  Addendum: SARS-CoV-2 test positive.  Start remdesivir and transfer to [facility].  UTI UA suggestive of infection. -Ceftriaxone -Urine culture pending  Non-anion gap metabolic acidosis Bicarb 17, anion gap 12.  Not on diuretic.  No diarrhea reported by family. -IV fluid hydration -Repeat BMP  Head trauma secondary to a mechanical fall No loss of consciousness reported by family.  CT head negative for acute intracranial hemorrhage.  Showing high right frontoparietal scalp swelling without large subjacent hematoma or calvarial fracture.  Age-indeterminate lacune in the right thalamus, new from prior exam but thought to be subacute to chronic.  Additional lacune in the left basal ganglia and left pons are similar to prior.  CT C-spine negative for fracture or traumatic listhesis. -Frequent neurochecks -Hold home Plavix at this time -PT/OT evaluation -Fall  precautions  AKI on CKD stage III Suspect prerenal due to dehydration/ acute viral illness.  BUN 33, creatinine 1.8.  Creatinine was down to 1.3 on labs last checked in June. -Gentle IV fluid hydration -Continue to monitor renal function -Monitor urine output  DVT prophylaxis: SCDs at this time given recent head trauma Code Status: Full code Family Communication: No family available at this time. Disposition Plan: Anticipate discharge after clinical improvement. Consults called:  None Admission status: It is my clinical opinion that admission to INPATIENT is reasonable and necessary in this 85 y.o. male . presenting with sepsis and acute hypoxic respiratory failure secondary to suspected COVID-19 viral multifocal pneumonia.  High risk of decompensation.  Given the aforementioned, the predictability of an adverse outcome is felt to be significant. I expect that the patient will require at least 2 midnights in the hospital to treat this condition.   The medical decision making on this patient was of high complexity and the patient is at high risk for clinical deterioration, therefore this is a level 3 visit.  Shela Leff MD [facility] Pager [phone]  If 7PM-7AM, please contact night-coverage [email or web address] Password [email or web address]  05/17/2019, 4:25 AM

## 2019-05-17 NOTE — ED Notes (Signed)
Pt transported to CT ?

## 2019-05-17 NOTE — ED Notes (Signed)
Lunch Tray Ordered @ 1031. 

## 2019-05-17 NOTE — Evaluation (Signed)
Physical Therapy Evaluation Patient Details Name: AGNEW ZINCK MRN: [id number] DOB: 1924-04-01 Today's Date: 05/17/2019   History of Present Illness  Pt is an 85 y.o. male admitted 06/02/2019 after fall trying to get out of bed and hitting his head. (+) COVID-19. UA suggestive of UTI. 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 Other imaging negative for acute fx. PMH includes dementia, CAD, HTN, PVD.    Clinical Impression  Pt presents with an overall decrease in functional mobility secondary to above. Pt pleasantly confused and poor historian; spoke with daughter who reports he lives with her, is ambulatory and has 24/7 supervision. Today, pt requiring minA for mobility (see vitals below); only oriented to self and poor short-term memory, but following simple commands well. Daughter hopeful pt will be moving well enough to return home with [facility] services, but is open to SNF if needed. Expect pt to progress well with mobility pending cardiovascular function and activity tolerance. Will follow acutely to address established goals.  SpO2 down to 83% on 2L O2 [place] with mobility Up to 88-92% on 3L O2 [place] (RN aware)    Follow Up Recommendations Home health PT;Supervision/Assistance - 24 hour(pending progression)    Equipment Recommendations  None recommended by PT    Recommendations for Other Services       Precautions / Restrictions Precautions Precautions: Fall Restrictions Weight Bearing Restrictions: No      Mobility  Bed Mobility Overal bed mobility: Needs Assistance Bed Mobility: Supine to Sit     Supine to sit: Min assist     General bed mobility comments: MinA for UE support to elevate trunk  Transfers Overall transfer level: Needs assistance Equipment used: 1 person hand held assist Transfers: Sit to/from Stand Sit to Stand: Min assist         General  transfer comment: Reliant on UE support and minA for trunk elevation; able to perform multiple sit<>stands throughout session from edge of bed and recliner  Ambulation/Gait Ambulation/Gait assistance: Min assist Gait Distance (Feet): 10 Feet Assistive device: 1 person hand held assist Gait Pattern/deviations: Step-through pattern;Decreased stride length;Trunk flexed Gait velocity: Decreased   General Gait Details: Slow, unsteady gait reliant on HHA and minA to maintain balance; SpO2 down to 83% on 2L O2   Stairs            Wheelchair Mobility    Modified Rankin (Stroke Patients Only)       Balance Overall balance assessment: Needs assistance   Sitting balance-Leahy Scale: Fair       Standing balance-Leahy Scale: Poor Standing balance comment: Reliant on UE support; required external assist to maintain balance when washing hands at sink and standing to use urinal                             Pertinent Vitals/Pain Pain Assessment: No/denies pain    Home Living Family/patient expects to be discharged to:: Private residence Living Arrangements: Children Available Help at Discharge: Family;Personal care attendant;Available 24 hours/day Type of Home: House       Home Layout: Two level;Able to live on main level with bedroom/bathroom Home Equipment: Gilford Rile - 2 wheels;Cane - single point;Shower seat Additional Comments: Lives with daughter, stays on main level. Daughter working from home and available 24/7, if not available she has sitter who stays with pt.  Prior Function Level of Independence: Needs assistance   Gait / Transfers Assistance Needed: Pt supposed to use RW but typically does not around the house; h/o falls. Has 24/7 supervision between family and sitter  ADL's / Homemaking Assistance Needed: Assist as needed for ADLs, 24/7 supervision. Assist for all IADL  Comments: Spoke with daughter Clarene Critchley) for PLOF and home-setup info     Hand  Dominance        Extremity/Trunk Assessment   Upper Extremity Assessment Upper Extremity Assessment: Generalized weakness    Lower Extremity Assessment Lower Extremity Assessment: Generalized weakness       Communication   Communication: HOH(speak into L ear)  Cognition Arousal/Alertness: Awake/alert Behavior During Therapy: WFL for tasks assessed/performed Overall Cognitive Status: History of cognitive impairments - at baseline                                 General Comments: Pt pleasantly confused. Only oriented to himself; following simple commands consistently. Very poor short-term memory, but pt aware of this deficits and tells PT about it. Limited by very [facility]      General Comments General comments (skin integrity, edema, etc.): Spoke with daughter Clarene Critchley) on phone; she is open to SNF if needed (pt went to SNF after previous admission a few months prior), but pt will have 24/7 support upon return home if moving well enough.    Exercises     Assessment/Plan    PT Assessment Patient needs continued PT services  PT Problem List Decreased strength;Decreased activity tolerance;Decreased balance;Decreased mobility;Decreased cognition;Decreased knowledge of use of DME;Decreased safety awareness;Cardiopulmonary status limiting activity       PT Treatment Interventions DME instruction;Gait training;Stair training;Functional mobility training;Therapeutic activities;Therapeutic exercise;Balance training;Patient/family education    PT Goals (Current goals can be found in the Care Plan section)  Acute Rehab PT Goals Patient Stated Goal: "I hope to see you soon Clarene Critchley" PT Goal Formulation: With patient/family Time For Goal Achievement: 05/31/19 Potential to Achieve Goals: Good    Frequency Min 3X/week   Barriers to discharge        Co-evaluation               AM-PAC PT "6 Clicks" Mobility  Outcome Measure Help needed turning from your back to  your side while in a flat bed without using bedrails?: A Little Help needed moving from lying on your back to sitting on the side of a flat bed without using bedrails?: A Little Help needed moving to and from a bed to a chair (including a wheelchair)?: A Little Help needed standing up from a chair using your arms (e.g., wheelchair or bedside chair)?: A Little Help needed to walk in hospital room?: A Little Help needed climbing 3-5 steps with a railing? : A Little 6 Click Score: 18    End of Session Equipment Utilized During Treatment: Oxygen Activity Tolerance: Patient tolerated treatment well Patient left: in chair;with call bell/phone within reach;with chair alarm set(posey chair alarm belt) Nurse Communication: Mobility status PT Visit Diagnosis: Other abnormalities of gait and mobility (R26.89);Muscle weakness (generalized) (M62.81);History of falling (Z91.81)    Time: [id number] PT Time Calculation (min) (ACUTE ONLY): 44 min   Charges:   PT Evaluation $PT Eval Moderate Complexity: 1 Mod PT Treatments $Therapeutic Activity: 8-22 mins $Self Care/Home Management: 8-22   Mabeline Caras, PT, DPT Acute Rehabilitation Services  Pager [phone] Office [phone]  Derry Lory 05/17/2019, 5:22  PM   

## 2019-05-17 NOTE — ED Notes (Signed)
Pt is 3rd on PTAR list for transport to [place]

## 2019-05-17 NOTE — ED Notes (Signed)
Pt confused. Currently disoriented to situation and time. Pt. Attempting to get out of bed multiple times. Pt reoriented call bell within reach. Pt. Given urinal. Use unsuccessful d/t confusion. Pt placed on external condom cath.

## 2019-05-18 DIAGNOSIS — N3000 Acute cystitis without hematuria: Secondary | ICD-10-CM

## 2019-05-18 LAB — CBC WITH DIFFERENTIAL/PLATELET
Abs Immature Granulocytes: 0.13 10*3/uL — ABNORMAL HIGH (ref 0.00–0.07)
Basophils Absolute: 0 10*3/uL (ref 0.0–0.1)
Basophils Relative: 0 %
Eosinophils Absolute: 0 10*3/uL (ref 0.0–0.5)
Eosinophils Relative: 0 %
HCT: 32 % — ABNORMAL LOW (ref 39.0–52.0)
Hemoglobin: 10.5 g/dL — ABNORMAL LOW (ref 13.0–17.0)
Immature Granulocytes: 1 %
Lymphocytes Relative: 4 %
Lymphs Abs: 0.9 10*3/uL (ref 0.7–4.0)
MCH: 31.4 pg (ref 26.0–34.0)
MCHC: 32.8 g/dL (ref 30.0–36.0)
MCV: 95.8 fL (ref 80.0–100.0)
Monocytes Absolute: 1.8 10*3/uL — ABNORMAL HIGH (ref 0.1–1.0)
Monocytes Relative: 8 %
Neutro Abs: 18.1 10*3/uL — ABNORMAL HIGH (ref 1.7–7.7)
Neutrophils Relative %: 87 %
Platelets: 291 10*3/uL (ref 150–400)
RBC: 3.34 MIL/uL — ABNORMAL LOW (ref 4.22–5.81)
RDW: 14.1 % (ref 11.5–15.5)
WBC: 20.9 10*3/uL — ABNORMAL HIGH (ref 4.0–10.5)
nRBC: 0 % (ref 0.0–0.2)

## 2019-05-18 LAB — COMPREHENSIVE METABOLIC PANEL
ALT: 29 U/L (ref 0–44)
AST: 43 U/L — ABNORMAL HIGH (ref 15–41)
Albumin: 2.8 g/dL — ABNORMAL LOW (ref 3.5–5.0)
Alkaline Phosphatase: 85 U/L (ref 38–126)
Anion gap: 15 (ref 5–15)
BUN: 43 mg/dL — ABNORMAL HIGH (ref 8–23)
CO2: 18 mmol/L — ABNORMAL LOW (ref 22–32)
Calcium: 8.6 mg/dL — ABNORMAL LOW (ref 8.9–10.3)
Chloride: 108 mmol/L (ref 98–111)
Creatinine, Ser: 1.83 mg/dL — ABNORMAL HIGH (ref 0.61–1.24)
GFR calc Af Amer: 36 mL/min — ABNORMAL LOW (ref >60)
GFR calc non Af Amer: 31 mL/min — ABNORMAL LOW (ref >60)
Glucose, Bld: 134 mg/dL — ABNORMAL HIGH (ref 70–99)
Potassium: 4.5 mmol/L (ref 3.5–5.1)
Sodium: 141 mmol/L (ref 135–145)
Total Bilirubin: 0.8 mg/dL (ref 0.3–1.2)
Total Protein: 7.4 g/dL (ref 6.5–8.1)

## 2019-05-18 LAB — GLUCOSE, CAPILLARY: Glucose-Capillary: 118 mg/dL — ABNORMAL HIGH (ref 70–99)

## 2019-05-18 LAB — MRSA PCR SCREENING: MRSA by PCR: POSITIVE — AB

## 2019-05-18 MED ORDER — OMEGA-3-ACID ETHYL ESTERS 1 G PO CAPS
1.0000 g | ORAL_CAPSULE | Freq: Every day | ORAL | Status: DC
  Administered 2019-05-18 – 2019-05-20 (×3): 1 g via ORAL
  Filled 2019-05-18 (×5): qty 1

## 2019-05-18 MED ORDER — DOXEPIN HCL 25 MG PO CAPS
25.0000 mg | ORAL_CAPSULE | Freq: Every day | ORAL | Status: DC
  Administered 2019-05-18 – 2019-05-20 (×3): 25 mg via ORAL
  Filled 2019-05-18 (×5): qty 1

## 2019-05-18 MED ORDER — SODIUM CHLORIDE 0.9% FLUSH
10.0000 mL | Freq: Two times a day (BID) | INTRAVENOUS | Status: DC
  Administered 2019-05-18 – 2019-05-22 (×9): 10 mL

## 2019-05-18 MED ORDER — ADULT MULTIVITAMIN W/MINERALS CH
ORAL_TABLET | Freq: Every morning | ORAL | Status: DC
  Administered 2019-05-19 – 2019-05-21 (×3): 1 via ORAL
  Filled 2019-05-18 (×5): qty 1

## 2019-05-18 MED ORDER — CLOPIDOGREL BISULFATE 75 MG PO TABS
75.0000 mg | ORAL_TABLET | Freq: Every day | ORAL | Status: DC
  Administered 2019-05-18 – 2019-05-21 (×4): 75 mg via ORAL
  Filled 2019-05-18 (×5): qty 1

## 2019-05-18 MED ORDER — POLYETHYLENE GLYCOL 3350 17 G PO PACK: 17.0000 g | PACK | Freq: Every day | ORAL | Status: DC | PRN

## 2019-05-18 MED ORDER — FERROUS SULFATE 325 (65 FE) MG PO TABS
650.0000 mg | ORAL_TABLET | Freq: Every day | ORAL | Status: DC
  Administered 2019-05-18 – 2019-05-21 (×3): 650 mg via ORAL
  Filled 2019-05-18 (×5): qty 2

## 2019-05-18 MED ORDER — CHLORHEXIDINE GLUCONATE CLOTH 2 % EX PADS
6.0000 | MEDICATED_PAD | Freq: Every day | CUTANEOUS | Status: AC
  Administered 2019-05-18 – 2019-05-22 (×5): 6 via TOPICAL

## 2019-05-18 MED ORDER — GUAIFENESIN-DM 100-10 MG/5ML PO SYRP: 5.0000 mL | ORAL_SOLUTION | ORAL | Status: DC | PRN

## 2019-05-18 MED ORDER — LORATADINE 10 MG PO TABS
5.0000 mg | ORAL_TABLET | Freq: Every day | ORAL | Status: DC
  Administered 2019-05-18 – 2019-05-21 (×4): 5 mg via ORAL
  Filled 2019-05-18: qty 0.5
  Filled 2019-05-18: qty 1
  Filled 2019-05-18 (×3): qty 0.5

## 2019-05-18 MED ORDER — RISAQUAD PO CAPS
ORAL_CAPSULE | Freq: Every morning | ORAL | Status: DC
  Administered 2019-05-18: 10:00:00 via ORAL
  Administered 2019-05-19 – 2019-05-21 (×3): 1 via ORAL
  Filled 2019-05-18 (×6): qty 1

## 2019-05-18 MED ORDER — OCUVITE-LUTEIN PO CAPS
ORAL_CAPSULE | Freq: Every day | ORAL | Status: DC
  Filled 2019-05-18: qty 1

## 2019-05-18 MED ORDER — ASPIRIN EC 81 MG PO TBEC
81.0000 mg | DELAYED_RELEASE_TABLET | Freq: Every day | ORAL | Status: DC
  Administered 2019-05-18 – 2019-05-20 (×3): 81 mg via ORAL
  Filled 2019-05-18 (×3): qty 1

## 2019-05-18 MED ORDER — CEPHALEXIN 250 MG PO CAPS
250.0000 mg | ORAL_CAPSULE | Freq: Three times a day (TID) | ORAL | Status: AC
  Administered 2019-05-18 – 2019-05-20 (×7): 250 mg via ORAL
  Filled 2019-05-18 (×9): qty 1

## 2019-05-18 MED ORDER — SODIUM CHLORIDE 0.9 % IV BOLUS
500.0000 mL | Freq: Once | INTRAVENOUS | Status: AC
  Administered 2019-05-18: 02:00:00 500 mL via INTRAVENOUS

## 2019-05-18 MED ORDER — VITAMIN B-6 100 MG PO TABS
100.0000 mg | ORAL_TABLET | Freq: Every day | ORAL | Status: DC
  Administered 2019-05-19 – 2019-05-21 (×3): 100 mg via ORAL
  Filled 2019-05-18 (×5): qty 1

## 2019-05-18 MED ORDER — SIMVASTATIN 10 MG PO TABS
10.0000 mg | ORAL_TABLET | Freq: Every day | ORAL | Status: DC
  Administered 2019-05-18 – 2019-05-20 (×3): 10 mg via ORAL
  Filled 2019-05-18 (×4): qty 1

## 2019-05-18 MED ORDER — DOCUSATE SODIUM 100 MG PO CAPS: 100.0000 mg | ORAL_CAPSULE | ORAL | Status: DC | PRN

## 2019-05-18 MED ORDER — PANTOPRAZOLE SODIUM 40 MG PO TBEC
40.0000 mg | DELAYED_RELEASE_TABLET | Freq: Every day | ORAL | Status: DC
  Administered 2019-05-18 – 2019-05-20 (×3): 40 mg via ORAL
  Filled 2019-05-18 (×3): qty 1

## 2019-05-18 MED ORDER — NITROGLYCERIN 0.4 MG SL SUBL: 0.4000 mg | SUBLINGUAL_TABLET | SUBLINGUAL | Status: DC | PRN

## 2019-05-18 MED ORDER — ORAL CARE MOUTH RINSE
15.0000 mL | Freq: Two times a day (BID) | OROMUCOSAL | Status: DC
  Administered 2019-05-18 – 2019-05-22 (×9): 15 mL via OROMUCOSAL

## 2019-05-18 MED ORDER — PROSIGHT PO TABS
1.0000 | ORAL_TABLET | Freq: Every day | ORAL | Status: DC
  Administered 2019-05-18 – 2019-05-21 (×4): 1 via ORAL
  Filled 2019-05-18 (×6): qty 1

## 2019-05-18 MED ORDER — SODIUM CHLORIDE 0.9% FLUSH: 10.0000 mL | INTRAVENOUS | Status: DC | PRN

## 2019-05-18 MED ORDER — MUPIROCIN 2 % EX OINT
1.0000 "application " | TOPICAL_OINTMENT | Freq: Two times a day (BID) | CUTANEOUS | Status: DC
  Administered 2019-05-18 – 2019-05-22 (×8): 1 via NASAL
  Filled 2019-05-18: qty 22

## 2019-05-18 MED ORDER — HYDROCOD POLST-CPM POLST ER 10-8 MG/5ML PO SUER
5.0000 mL | Freq: Two times a day (BID) | ORAL | Status: DC
  Administered 2019-05-18 – 2019-05-21 (×6): 5 mL via ORAL
  Filled 2019-05-18 (×6): qty 5

## 2019-05-18 MED ORDER — VITAMIN C 500 MG PO TABS
1000.0000 mg | ORAL_TABLET | Freq: Every day | ORAL | Status: DC
  Administered 2019-05-18 – 2019-05-21 (×4): 1000 mg via ORAL
  Filled 2019-05-18 (×4): qty 2

## 2019-05-18 NOTE — Progress Notes (Addendum)
PROGRESS NOTE    Larry Huffman  [id number] DOB: 10/18/1923 DOA: 06/01/2019 PCP: Lawerance Cruel, MD    Brief Narrative:  85 year old male who presented after a fall and head injury.  He does have significant past medical history for coronary artery disease status post PCI, dyslipidemia, hypertension, peripheral vascular disease and dementia.  At home patient sustained a mechanical fall while getting out of his bed, positive head trauma.  Apparently he had fevers and chills for 24 hours, he had a recent travel to [place].  On his initial physical examination his temperature was 100.6 F, he was tachycardic and tachypneic, 21 to 28 breaths/min, his oxygen saturation was 90% on room air, heart S1-S2 present and rhythmic, his lungs had coarse breath sounds bilaterally, abdomen was soft, no lower extremity edema. Sodium 137, potassium 4.5, chloride 108, bicarb 17, glucose 121, BUN 33, creatinine 1.87, white count 16.2, hemoglobin 10.7, hematocrit 33.2, platelets 246, his urinalysis had more than 50 white cells, SARS COVID-19 positive.  Chest radiograph with bilateral reticular nodular infiltrates, right upper lobe and left lower lobe.  EKG 86 bpm, left axis deviation, normal intervals, sinus rhythm, no ST segment or T wave changes, positive baseline artifact.  Patient was admitted to the hospital with a working diagnosis of acute hypoxic respiratory failure due to SARS COVID-19 viral pneumonia.   Assessment & Plan:   Principal Problem:   Suspected COVID-19 virus infection Active Problems:   Multifocal pneumonia   UTI (urinary tract infection)   Fall at home, initial encounter  SEPSIS ruled out.   1. Acute hypoxic respiratory failure due to SARS COVID 19 viral pneumonia. Patient reports improvement in his symptoms, no significant cough or chest pain. But persistent tachypnea and hypoxemia, with high risk of deterioration.   RR: 30 to 31  Pulse oxymetry: 82 to 89%  Fi02: 38% 5  LPM  COVID-19 Labs  Recent Labs    05/17/19 0302  DDIMER 2.43*  FERRITIN 524*  LDH 233*  CRP 32.2*   Elevated inflammatory markers, will continue trend for now.  Continue medical therapy with Remdesivir #2/5 and systemic corticosteroids with dexamethasone. Antitussive with guaifenesin, chlorpheniramine and DM. Airway clearing techniques with incentive spirometer and flutter valve. Physical therapy and out of bed as tolerated.   2. UTI. Present on admission. Will continue antibiotic therapy with cephalexin for 3 days total.   3. CAD with dyslipidemia. No active chest pain, will continue dual antiplatelet therapy with asa and clopidogrel. Continue with atorvastatin.   4. Iron deficiency anemia. Continue with iron supplementation.      DVT prophylaxis: enoxaparin   Code Status:  full Family Communication: I spoke over the phone with the patient's daughter about patient's  condition, plan of care and all questions were addressed.  Disposition Plan/ discharge barriers:  Pending clinical improvement.   There is no height or weight on file to calculate BMI. Malnutrition Type:      Malnutrition Characteristics:      Nutrition Interventions:     RN Pressure Injury Documentation:     Consultants:     Procedures:     Antimicrobials:       Subjective: Patient has improvement in his symptoms but not yet back to baseline, no chest pain, no nausea or vomiting.   Objective: Vitals:   05/17/19 1800 05/17/19 2114 05/18/19 0040 05/18/19 0400  BP: (!) 147/62 (!) 175/63 (!) 170/76 (!) 147/62  Pulse: 86 97 99 75  Resp: 20 (!) 25 (!) 30 (!)  31  Temp:  98.7 F (37.1 C) (!) 102.7 F (39.3 C)   TempSrc:  Oral Axillary   SpO2: 91% 90% 93% (!) 89%    Intake/Output Summary (Last 24 hours) at 05/18/2019 [id number] Last data filed at 05/18/2019 0700 Gross per 24 hour  Intake 1690 ml  Output 375 ml  Net 1315 ml   There were no vitals filed for this visit.  Examination:    General: Not in pain or dyspnea., deconditioned  Neurology: Awake and alert, non focal, decreased hearing.  E ENT: no pallor, no icterus, oral mucosa moist Cardiovascular: No JVD. S1-S2 present, rhythmic, no gallops, rubs, or murmurs. No lower extremity edema. Pulmonary: positive breath sounds bilaterally. Gastrointestinal. Abdomen flat, no organomegaly, non tender, no rebound or guarding Skin. No rashes Musculoskeletal: no joint deformities     Data Reviewed: I have personally reviewed following labs and imaging studies  CBC: Recent Labs  Lab 05/17/2019 2253 05/18/19 0102  WBC 16.2* 20.9*  NEUTROABS 12.9* 18.1*  HGB 10.7* 10.5*  HCT 33.2* 32.0*  MCV 98.2 95.8  PLT 246 [id number]   Basic Metabolic Panel: Recent Labs  Lab 05/24/2019 2253 05/18/19 0102  NA 137 141  K 4.5 4.5  CL 108 108  CO2 17* 18*  GLUCOSE 121* 134*  BUN 33* 43*  CREATININE 1.87* 1.83*  CALCIUM 8.5* 8.6*   GFR: CrCl cannot be calculated (Unknown ideal weight.). Liver Function Tests: Recent Labs  Lab 06/01/2019 2253 05/18/19 0102  AST 51* 43*  ALT 33 29  ALKPHOS 85 85  BILITOT 0.3 0.8  PROT 7.1 7.4  ALBUMIN 2.8* 2.8*   No results for input(s): LIPASE, AMYLASE in the last 168 hours. No results for input(s): AMMONIA in the last 168 hours. Coagulation Profile: No results for input(s): INR, PROTIME in the last 168 hours. Cardiac Enzymes: No results for input(s): CKTOTAL, CKMB, CKMBINDEX, TROPONINI in the last 168 hours. BNP (last 3 results) No results for input(s): PROBNP in the last 8760 hours. HbA1C: No results for input(s): HGBA1C in the last 72 hours. CBG: No results for input(s): GLUCAP in the last 168 hours. Lipid Profile: No results for input(s): CHOL, HDL, LDLCALC, TRIG, CHOLHDL, LDLDIRECT in the last 72 hours. Thyroid Function Tests: No results for input(s): TSH, T4TOTAL, FREET4, T3FREE, THYROIDAB in the last 72 hours. Anemia Panel: Recent Labs    05/17/19 0302  FERRITIN 14*       Radiology Studies: I have reviewed all of the imaging during this hospital visit personally     Scheduled Meds: . dexamethasone (DECADRON) injection  6 mg Intravenous Q24H  . mouth rinse  15 mL Mouth Rinse BID  . vitamin C  500 mg Oral Daily  . zinc sulfate  220 mg Oral Daily   Continuous Infusions: . azithromycin Stopped (05/18/19 0411)  . cefTRIAXone (ROCEPHIN)  IV Stopped (05/18/19 0236)  . remdesivir 100 mg in NS 250 mL       LOS: 1 day        Mauricio Gerome Apley, MD

## 2019-05-18 NOTE — Evaluation (Signed)
Occupational Therapy Evaluation Patient Details Name: Larry Huffman MRN: [id number] DOB: Sep 09, 1923 Today's Date: 05/18/2019    History of Present Illness Pt is a 85 y.o. male admitted 06/08/2019 after fall trying to get out of bed and hitting his head. (+) COVID-19. UA suggestive of UTI. 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 Other imaging negative for acute fx. PMH includes dementia, CAD, HTN, PVD.   Clinical Impression   This 85 y/o male presents with the above. PTA pt living with daughter, typically mobilizing without AD and daughter assisting with ADL PRN. Pt presenting with weakness, baseline impaired cognition, decreased activity tolerance. Pt very pleasant and willing to work with therapy, aware of current situation. He currently requires minA for functional transfers (HHA), mod-maxA for LB ADL and minA for seated UB and grooming ADL. Pt initially on 5L O2 with SpO2 decreasing with stand pivot transfer, lowest O2 sat noted 73% while seated in recliner. Increased to 6L with SpO2 increasing and sustaining in the upper 80s-90% within approx 2 min (cued for deep breathing). Am hopeful that pt will progress to point of returning home with family/caregiver assist (daughter reports able to have caregiver assist when she is not able to). He will benefit from continued acute OT services and recommend [place] services (pending progress) after discharge. Will follow.     Follow Up Recommendations  Home health OT;Supervision/Assistance - 24 hour(pending progress)    Equipment Recommendations  3 in 1 bedside commode           Precautions / Restrictions Precautions Precautions: Fall Precaution Comments: watch O2, hx of falls Restrictions Weight Bearing Restrictions: No      Mobility Bed Mobility Overal bed mobility: Needs Assistance Bed Mobility: Supine to Sit     Supine  to sit: Min assist     General bed mobility comments: MinA for UE support to elevate trunk  Transfers Overall transfer level: Needs assistance Equipment used: 1 person hand held assist Transfers: Sit to/from [facility] Sit to Stand: Min assist Stand pivot transfers: Min assist       General transfer comment: steadying assist provided throughout with pt utilizing UE support either via HHA or on arm of recliner    Balance Overall balance assessment: Needs assistance;History of Falls Sitting-balance support: Feet supported Sitting balance-Leahy Scale: Fair     Standing balance support: Single extremity supported Standing balance-Leahy Scale: Poor Standing balance comment: reliant on at least single UE support/external assist                           ADL either performed or assessed with clinical judgement   ADL Overall ADL's : Needs assistance/impaired Eating/Feeding: Set up;Sitting Eating/Feeding Details (indicate cue type and reason): to open containers, pt able to peel banana on his own Grooming: Wash/dry face;Brushing hair;Set up;Minimal assistance;Sitting Grooming Details (indicate cue type and reason): able to wash face with setup assist while seated in recliner; requires support under R and LUE to reach/comb hair; pt using LUE to self-support RUE as well Upper Body Bathing: Minimal assistance;Sitting   Lower Body Bathing: Moderate assistance;Sit to/from stand   Upper Body Dressing : Minimal assistance;Sitting   Lower Body Dressing: Moderate assistance;Sit to/from stand   Toilet Transfer: Minimal assistance;Stand-pivot Armed forces technical officer Details (indicate cue type and reason): simulated via transfer to [place] and  Hygiene: Moderate assistance;Sit to/from stand       Functional mobility during ADLs: Minimal assistance(HHA) General ADL Comments: pt with decreased respiratory status this session so limited  mobility OOB to chair, performed additional ADL while seated in recliner                         Pertinent Vitals/Pain Pain Assessment: No/denies pain     Hand Dominance     Extremity/Trunk Assessment Upper Extremity Assessment Upper Extremity Assessment: Generalized weakness;RUE deficits/detail;LUE deficits/detail RUE Deficits / Details: difficult to fully perform shoulder ROM for functional task, requires assist RUE Coordination: decreased gross motor LUE Deficits / Details: difficult to fully perform shoulder ROM for functional task, requires assist LUE Coordination: decreased gross motor   Lower Extremity Assessment Lower Extremity Assessment: Defer to PT evaluation       Communication Communication Communication: HOH(speak into L ear)   Cognition Arousal/Alertness: Awake/alert Behavior During Therapy: WFL for tasks assessed/performed Overall Cognitive Status: History of cognitive impairments - at baseline                                 General Comments: pt very pleasantly confused, following simple commands consistently, decreased STM but pt aware of this and telling therapist about it, very HOH so difficulty to fully assess   General Comments       Exercises     Shoulder Instructions      Home Living Family/patient expects to be discharged to:: Private residence Living Arrangements: Children Available Help at Discharge: Family;Personal care attendant;Available 24 hours/day Type of Home: House       Home Layout: Two level;Able to live on main level with bedroom/bathroom         Bathroom Toilet: Standard     Home Equipment: Walker - 2 wheels;Cane - single point;Shower seat   Additional Comments: Lives with daughter, stays on main level. Daughter working from home and available 24/7, if not available she has sitter who stays with pt.      Prior Functioning/Environment Level of Independence: Needs assistance  Gait / Transfers  Assistance Needed: Pt supposed to use RW but typically does not around the house; h/o falls. Has 24/7 supervision between family and sitter ADL's / Homemaking Assistance Needed: Assist as needed for ADLs, 24/7 supervision. Assist for all IADL   Comments: PLOF and home setup obtained via chart review/PT eval who spoke with pt's daughter        OT Problem List: Decreased strength;Decreased range of motion;Decreased activity tolerance;Impaired balance (sitting and/or standing);Decreased cognition;Decreased safety awareness;Decreased knowledge of use of DME or AE;Cardiopulmonary status limiting activity      OT Treatment/Interventions: Self-care/ADL training;Therapeutic exercise;Neuromuscular education;Energy conservation;Therapeutic activities;Patient/family education;Balance training;Cognitive remediation/compensation    OT Goals(Current goals can be found in the care plan section) Acute Rehab OT Goals Patient Stated Goal: "I hope to see you soon Clarene Critchley" OT Goal Formulation: With patient Time For Goal Achievement: 06/01/19 Potential to Achieve Goals: Good  OT Frequency: Min 3X/week   Barriers to D/C:            Co-evaluation              AM-PAC OT "6 Clicks" Daily Activity     Outcome Measure Help from another person eating meals?: A Little Help from another person taking care of personal grooming?: A Little Help from another person toileting, which includes using toliet,  bedpan, or urinal?: A Lot Help from another person bathing (including washing, rinsing, drying)?: A Lot Help from another person to put on and taking off regular upper body clothing?: A Little Help from another person to put on and taking off regular lower body clothing?: A Lot 6 Click Score: 15   End of Session Equipment Utilized During Treatment: Oxygen Nurse Communication: Mobility status  Activity Tolerance: Patient tolerated treatment well Patient left: in chair;with call bell/phone within  reach;Other (comment)(posey belt alarm set)  OT Visit Diagnosis: Muscle weakness (generalized) (M62.81);Unsteadiness on feet (R26.81);Other symptoms and signs involving cognitive function                Time: [id number] OT Time Calculation (min): 42 min Charges:  OT General Charges $OT Visit: 1 Visit OT Evaluation $OT Eval Moderate Complexity: 1 Mod OT Treatments $Self Care/Home Management : 23-37 mins  Lou Cal, OT Supplemental Rehabilitation Services Pager [phone] Office [phone]   Raymondo Band 05/18/2019, 1:32 PM

## 2019-05-18 NOTE — Progress Notes (Signed)
Spoke with Teresa(daughter) gave updates.  Pt in ICU.  All questions answered at this time.

## 2019-05-18 NOTE — Progress Notes (Signed)
Patient came to ICU for decreased Sp02 levels despite an increased in Fio2. Placed patient on 100% NRB with high flow salter set at 8lpm. Sp02=93-95% at this time, will continue to monitor patient.

## 2019-05-18 NOTE — Progress Notes (Signed)
   Vital Signs MEWS/VS Documentation      05/17/2019 1800 05/17/2019 2005 05/17/2019 2114 05/18/2019 0040   MEWS Score:  0  0  1  4   MEWS Score Color:  Green  Green  Green  Red   Resp:  20  -  (!) 25  (!) 30   Pulse:  86  -  97  99   BP:  (!) 147/62  -  (!) 175/63  (!) 170/76   Temp:  -  -  98.7 F (37.1 C)  (!) 102.7 F (39.3 C)   O2 Device:  Nasal Cannula  -  Nasal Cannula  Nasal Cannula   O2 Flow Rate (L/min):  3 L/min  -  3 L/min  3 L/min   Level of Consciousness:  Alert  -  Alert  Alert      MD Shanon Brow notified.  Tylenol given for temp.       Mateo Flow, Dontell Mian K 05/18/2019,12:47 AM

## 2019-05-19 DIAGNOSIS — U071 COVID-19: Principal | ICD-10-CM

## 2019-05-19 LAB — URINE CULTURE: Culture: >=100000 — AB

## 2019-05-19 LAB — COMPREHENSIVE METABOLIC PANEL
ALT: 29 U/L (ref 0–44)
AST: 59 U/L — ABNORMAL HIGH (ref 15–41)
Albumin: 2.5 g/dL — ABNORMAL LOW (ref 3.5–5.0)
Alkaline Phosphatase: 88 U/L (ref 38–126)
Anion gap: 16 — ABNORMAL HIGH (ref 5–15)
BUN: 63 mg/dL — ABNORMAL HIGH (ref 8–23)
CO2: 15 mmol/L — ABNORMAL LOW (ref 22–32)
Calcium: 8.6 mg/dL — ABNORMAL LOW (ref 8.9–10.3)
Chloride: 113 mmol/L — ABNORMAL HIGH (ref 98–111)
Creatinine, Ser: 1.72 mg/dL — ABNORMAL HIGH (ref 0.61–1.24)
GFR calc Af Amer: 39 mL/min — ABNORMAL LOW (ref >60)
GFR calc non Af Amer: 34 mL/min — ABNORMAL LOW (ref >60)
Glucose, Bld: 135 mg/dL — ABNORMAL HIGH (ref 70–99)
Potassium: 5 mmol/L (ref 3.5–5.1)
Sodium: 144 mmol/L (ref 135–145)
Total Bilirubin: 0.5 mg/dL (ref 0.3–1.2)
Total Protein: 6.8 g/dL (ref 6.5–8.1)

## 2019-05-19 LAB — FERRITIN: Ferritin: 954 ng/mL — ABNORMAL HIGH (ref 24–336)

## 2019-05-19 LAB — C-REACTIVE PROTEIN: CRP: 34.5 mg/dL — ABNORMAL HIGH (ref <1.0)

## 2019-05-19 LAB — D-DIMER, QUANTITATIVE: D-Dimer, Quant: 2.55 ug/mL-FEU — ABNORMAL HIGH (ref 0.00–0.50)

## 2019-05-19 MED ORDER — HEPARIN SODIUM (PORCINE) 5000 UNIT/ML IJ SOLN
5000.0000 [IU] | Freq: Two times a day (BID) | INTRAMUSCULAR | Status: DC
  Administered 2019-05-19 – 2019-05-22 (×7): 5000 [IU] via SUBCUTANEOUS
  Filled 2019-05-19 (×7): qty 1

## 2019-05-19 MED ORDER — HALOPERIDOL LACTATE 5 MG/ML IJ SOLN
2.0000 mg | Freq: Four times a day (QID) | INTRAMUSCULAR | Status: DC | PRN
  Administered 2019-05-20 – 2019-05-21 (×5): 2 mg via INTRAVENOUS
  Filled 2019-05-19 (×6): qty 1

## 2019-05-19 NOTE — Progress Notes (Signed)
NAME:  Larry Huffman, MRN:  [id number], DOB:  30-Jul-1923, LOS: 2 ADMISSION DATE:  05/27/2019, CONSULTATION DATE: 05/19/2019 REFERRING MD: Hyman Hopes, CHIEF COMPLAINT: Hypoxia  Brief History   85 year old man admitted 11/4 for Covid pneumonia.  History of present illness   EMS was originally called for a fall to ground.  He is demented and was able to explain why felt unwell.  On further investigation in the emergency department he was found to be febrile with low oxygen saturation and a chest x-ray compatible with a diagnosis of Covid pneumonia which was later confirmed by nasal swab. CT head did not show any evidence of acute injuries.  There are old strokes.  He was transferred to [facility] and eventually progressed to the ICU because of increasing hypoxia.  Past Medical History   Past Medical History:  Diagnosis Date  . Acid reflux   . Arthritis   . CAD (coronary artery disease)   . Cancer (HCC) hx of skin cancer  . Constipation   . Coronary artery disease   . High cholesterol   . HOH (hard of hearing)    wears hearing aids, but still can not hear  . Hypertension   . Incontinence of urine   . Peripheral vascular disease ([place])    Remote left carotid endarterectomy  . Sleep apnea    CPAP   Past Surgical History:  Procedure Laterality Date  . 2-D echocardiogram  03/07/2008   Ejection fraction greater than 55%. Mild concentric left ventricular hypertrophy.LV relaxation. mildly dilated left atrium. Mild mitral regurgitation.   . ANAL FISSURE REPAIR    . CARDIAC CATHETERIZATION     X 2 stents  . COLON SURGERY     removed part of the small intestine  . COLONOSCOPY W/ POLYPECTOMY    . CORONARY STENT PLACEMENT    . ESOPHAGOGASTRODUODENOSCOPY N/A 01/16/2015   Procedure: ESOPHAGOGASTRODUODENOSCOPY (EGD);  Surgeon: Irene Shipper, MD;  Location: [facility] ENDOSCOPY;  Service: Endoscopy;  Laterality: N/A;  . EYE SURGERY     cataracts  . I&D EXTREMITY  06/28/2012   Procedure:  IRRIGATION AND DEBRIDEMENT EXTREMITY;  Surgeon: Newt Minion, MD;  Location: [facility];  Service: Orthopedics;  Laterality: Right;  Debridement Wound Right Leg, VAC, Antibiotic Beads, Apply A-Cell  . IRRIGATION AND DEBRIDEMENT ABSCESS    . LAPAROTOMY    . LUMBAR LAMINECTOMY N/A 02/08/2014   Procedure: L3-4, L4-5 Decompression;  Surgeon: Marybelle Killings, MD;  Location: [facility];  Service: Orthopedics;  Laterality: N/A;  L3-4, L4-5 Decompression  . NASAL SINUS SURGERY    . PROSTATE SURGERY    . [place] [facility] Events   11/4-ad[facility] to [facility]. 11/6-transfer to ICU for increasing hypoxia and increased respiratory rate.  Consults:  PCCM  Procedures:  None  Significant Diagnostic Tests:  Chest x-ray 11/4 -patchy bilateral infiltrates consistent with Covid. CT head/cervical spine/face 11/4-5-old strokes but no acute injury.  Micro Data:  11/4 SARS-CoV-2 positive. 11/4 blood cultures-negative 11//4 urine culture-greater than 100,000 Klebsiella pneumonia (not clean-catch)  Antimicrobials:  Azithromycin 11/4-11/5 Ceftriaxone 11/4-11/5 Remdesivir 11/5-4-day course  Interim history/subjective:  Comfortable overnight.  Decreased work of breathing.  Patient is confused but calm.  Was agitated overnight.  Objective   Blood pressure (!) 139/57, pulse 64, temperature 98.2 F (36.8 C), temperature source Axillary, resp. rate (!) 30, height 5\' 7"  (1.702 m), weight 75.6 kg, SpO2 93 %.  FiO2 (%):  [100 %] 100 %   Intake/Output Summary (Last 24 hours) at 05/19/2019 1141 Last data filed at 05/19/2019 0600 Gross per 24 hour  Intake 183.33 ml  Output 600 ml  Net -416.67 ml   Filed Weights   05/19/19 1100  Weight: 75.6 kg    Examination: General: Frail elderly looking man.  In no distress.  Sleeping comfortably HENT: No scleral icterus.  Mucous membranes are moist. Lungs: Crackles at both bases.  No accessory muscle use on high flow  heated nasal cannula. Cardiovascular: No JVD observed.  Extremities warm and well-perfused.  Heart sounds unremarkable. Abdomen: Soft and nontender Extremities: No edema.  Multiples skin ecchymoses. Neuro: Confused but no focal neurological deficits. GU: Condom catheter.  Resolved Hospital Problem list   Contaminated urine culture.  Assessment & Plan:  Critically ill due to hypoxemic respiratory failure requiring high flow nasal cannula. Appears to be improving. No signs of respiratory distress. Given age and underlying dementia, would not tolerate intubation well. -Continue high flow nasal cannula. -Intubated for only extreme respiratory distress. -We will revisit CODE STATUS with family.  COVID-19 pneumonia No evidence of superimposed pneumonia or heart failure -Complete 4-day course of remdesivir -Complete minimum of 5-day course of dexamethasone, high risk of steroid-induced delirium.  Would adjust duration of therapy based on clinical response.  Baseline dementia Presently calm At risk for worsening with steroid -As needed haloperidol to start.  History of prior coronary artery disease -Continue home secondary prevention including antiplatelet agents.   Best practice:  Diet: Swallow evaluation at bed Pain/Anxiety/Delirium protocol (if indicated): As needed Haldol VAP protocol (if indicated): Not indicated DVT prophylaxis: Heparin SQ twice daily GI prophylaxis: Not indicated Glucose control: Monitoring for now Mobility: Up to chair Code Status: Full code Family Communication: Family updated by nurse.  Will contact them for further discussions. Disposition: ICU  Labs   CBC: Recent Labs  Lab 06/05/2019 2253 05/18/19 0102  WBC 16.2* 20.9*  NEUTROABS 12.9* 18.1*  HGB 10.7* 10.5*  HCT 33.2* 32.0*  MCV 98.2 95.8  PLT 246 [id number]    Basic Metabolic Panel: Recent Labs  Lab 05/31/2019 2253 05/18/19 0102 05/19/19 0625  NA 137 141 144  K 4.5 4.5 5.0  CL 108 108  113*  CO2 17* 18* 15*  GLUCOSE 121* 134* 135*  BUN 33* 43* 63*  CREATININE 1.87* 1.83* 1.72*  CALCIUM 8.5* 8.6* 8.6*   GFR: Estimated Creatinine Clearance: 25.1 mL/min (A) (by C-G formula based on SCr of 1.72 mg/dL (H)). Recent Labs  Lab 05/28/2019 2253 05/17/19 0016 05/17/19 0156 05/17/19 0302 05/18/19 0102  PROCALCITON  --   --   --  0.50  --   WBC 16.2*  --   --   --  20.9*  LATICACIDVEN  --  1.3 1.0  --   --     Liver Function Tests: Recent Labs  Lab 06/08/2019 2253 05/18/19 0102 05/19/19 0625  AST 51* 43* 59*  ALT 33 29 29  ALKPHOS 85 85 88  BILITOT 0.3 0.8 0.5  PROT 7.1 7.4 6.8  ALBUMIN 2.8* 2.8* 2.5*   No results for input(s): LIPASE, AMYLASE in the last 168 hours. No results for input(s): AMMONIA in the last 168 hours.  ABG    Component Value Date/Time   HCO3 23.3 04/07/2007 1524   TCO2 18 03/07/2016 1952   ACIDBASEDEF 2.0 04/07/2007 1524     Coagulation Profile: No results for input(s): INR, PROTIME in the last 168 hours.  Cardiac Enzymes: No results for input(s): CKTOTAL, CKMB, CKMBINDEX, TROPONINI in the last 168 hours.  HbA1C: Hgb A1c MFr Bld  Date/Time Value Ref Range Status  05/29/2010 12:59 PM (H) <5.7 % Final   6.2 (NOTE)                                                                       According to the ADA Clinical Practice Recommendations for 2011, when HbA1c is used as a screening test:   >=6.5%   Diagnostic of Diabetes Mellitus           (if abnormal result  is confirmed)  5.7-6.4%   Increased risk of developing Diabetes Mellitus  References:Diagnosis and Classification of Diabetes Mellitus,Diabetes [id number] 1):S62-S69 and Standards of Medical Care in         Diabetes - 2011,Diabetes [id number]  (Suppl 1):S11-S61.    CBG: Recent Labs  Lab 05/18/19 2309  GLUCAP 118*    CRITICAL CARE Performed by: Kipp Brood   Total critical care time: 35 minutes  Critical care time was exclusive of separately billable  procedures and treating other patients.  Critical care was necessary to treat or prevent imminent or life-threatening deterioration.  Critical care was time spent personally by me on the following activities: development of treatment plan with patient and/or surrogate as well as nursing, discussions with consultants, evaluation of patient's response to treatment, examination of patient, obtaining history from patient or surrogate, ordering and performing treatments and interventions, ordering and review of laboratory studies, ordering and review of radiographic studies, pulse oximetry, re-evaluation of patient's condition and participation in multidisciplinary rounds.  Kipp Brood, MD [facility] ICU Physician [place]  Pager: [phone] Mobile: [phone] After hours: [phone].

## 2019-05-19 NOTE — Progress Notes (Signed)
Placed patient on heated high flow set at 35LPM with 100% along with 100% NRB mask. Sp02 level at this time is 90-93%.

## 2019-05-19 NOTE — Progress Notes (Signed)
Larry Huffman  [id number] DOB: 1923/07/26 DOA: 05/20/2019 PCP: Lawerance Cruel, MD    Brief Narrative:  [phone] with a history of CAD status post PCI, HLD, HTN, dementia, and PVD who presented after a fall and head injury. At home patient sustained a mechanical fall while getting out of his bed. His ROS noted fevers and chills for 24 hours. He was found to be tachycardic and tachypneic, w/ sat of 90% on room air. He was SARS CoV2 positive. A CXR noted bilateral reticular nodular infiltrates, right upper lobe and left lower lobe. Patient was admitted for acute hypoxic respiratory failure due to COVID pneumonia.  Significant Events: 11/5 admit to [facility] via [facility] ED 11/6 transfer to ICU with worsening hypoxic failure  COVID-19 specific Treatment: Remdesivir 11/5 > Decadron 11/5 >  Subjective: The patient is requiring heated high flow nasal cannula and nonrebreather at the time of my visit.  He is somewhat sedate and slightly agitated.  His respirations are mildly labored.  Assessment & Plan:  Covid pneumonia - acute hypoxic respiratory failure Currently requiring HFNC and intermittent nonrebreather facemask support -agree DNR status is appropriate -continue current measures and follow  Dementia Family reports consistent decline over the last year plus in cognitive state and quality of life  Head trauma secondary to mechanical fall CT head negative for intracranial hemorrhage  Acute kidney injury on CKD stage III Follow creatinine trend  Recent Labs  Lab 05/29/2019 2253 05/18/19 0102 05/19/19 0625 05/20/19 0600  CREATININE 1.87* 1.83* 1.72* 1.73*   Klebsiella pneumoniae UTI Will complete 5 days of antibiotic therapy  CAD Continue usual medical care  HLD Continue usual home meds  Iron deficiency anemia Hemoglobin stable presently with no blood loss  DVT prophylaxis: Lovenox Code Status: DNR - NO CODE  Family Communication: Per PCCM Disposition Plan:  ICU  Consultants:  PCCM  Antimicrobials:  Azithromycin 11/4 > 11/5 Ceftriaxone 11/4 > 11/5 Cephalexin 11/6 > 11/8  Objective: Blood pressure (!) 147/105, pulse 83, temperature 97.8 F (36.6 C), temperature source Axillary, resp. rate (!) 28, height 5\' 7"  (1.702 m), weight 75.6 kg, SpO2 90 %.  Intake/Output Summary (Last 24 hours) at 05/20/2019 1809 Last data filed at 05/20/2019 1500 Gross per 24 hour  Intake 350 ml  Output 1020 ml  Net -670 ml   Filed Weights   05/19/19 1100  Weight: 75.6 kg    Examination: General: Modest respiratory distress -confused Lungs: Fine diffuse crackles Cardiovascular: RRR Abdomen: Soft, BS positive, no rebound Extremities: No significant edema bilateral lower extremities  CBC: Recent Labs  Lab 05/26/2019 2253 05/18/19 0102  WBC 16.2* 20.9*  NEUTROABS 12.9* 18.1*  HGB 10.7* 10.5*  HCT 33.2* 32.0*  MCV 98.2 95.8  PLT 246 Q000111Q   Basic Metabolic Panel: Recent Labs  Lab 05/18/19 0102 05/19/19 0625 05/20/19 0600  NA 141 144 147*  K 4.5 5.0 4.5  CL 108 113* 119*  CO2 18* 15* 18*  GLUCOSE 134* 135* 166*  BUN 43* 63* 72*  CREATININE 1.83* 1.72* 1.73*  CALCIUM 8.6* 8.6* 8.4*   GFR: Estimated Creatinine Clearance: 24.9 mL/min (A) (by C-G formula based on SCr of 1.73 mg/dL (H)).  Liver Function Tests: Recent Labs  Lab 06/10/2019 2253 05/18/19 0102 05/19/19 0625 05/20/19 0600  AST 51* 43* 59* 38  ALT 33 29 29 26   ALKPHOS 85 85 88 83  BILITOT 0.3 0.8 0.5 0.4  PROT 7.1 7.4 6.8 6.5  ALBUMIN 2.8* 2.8* 2.5* 2.1*  HbA1C: Hgb A1c MFr Bld  Date/Time Value Ref Range Status  05/29/2010 12:59 PM (H) <5.7 % Final   6.2 (NOTE)                                                                       According to the ADA Clinical Practice Recommendations for 2011, when HbA1c is used as a screening test:   >=6.5%   Diagnostic of Diabetes Mellitus           (if abnormal result  is confirmed)  5.7-6.4%   Increased risk of developing Diabetes  [place]  References:Diagnosis and Classification of Diabetes Mellitus,Diabetes [id number] 1):S62-S69 and Standards of Medical Care in         Diabetes - 2011,Diabetes [id number]  (Suppl 1):S11-S61.    CBG: Recent Labs  Lab 05/18/19 2309  GLUCAP 118*    Recent Results (from the past 240 hour(s))  Urine Culture     Status: Abnormal   Collection Time: 05/31/2019  8:16 AM   Specimen: Urine, Random  Result Value Ref Range Status   Specimen Description URINE, RANDOM  Final   Special Requests   Final    NONE Performed at [facility], [address], [place], [place] [address]    Culture >=100,000 COLONIES/mL KLEBSIELLA PNEUMONIAE (A)  Final   Report Status 05/19/2019 FINAL  Final   Organism ID, Bacteria KLEBSIELLA PNEUMONIAE (A)  Final      Susceptibility   Klebsiella pneumoniae - MIC*    AMPICILLIN >=32 RESISTANT Resistant     CEFAZOLIN <=4 SENSITIVE Sensitive     CEFTRIAXONE <=1 SENSITIVE Sensitive     CIPROFLOXACIN <=0.25 SENSITIVE Sensitive     GENTAMICIN <=1 SENSITIVE Sensitive     IMIPENEM <=0.25 SENSITIVE Sensitive     NITROFURANTOIN 64 INTERMEDIATE Intermediate     TRIMETH/SULFA <=20 SENSITIVE Sensitive     AMPICILLIN/SULBACTAM 4 SENSITIVE Sensitive     PIP/TAZO <=4 SENSITIVE Sensitive     Extended ESBL NEGATIVE Sensitive     * >=100,000 COLONIES/mL KLEBSIELLA PNEUMONIAE  SARS CORONAVIRUS 2 (TAT 6-24 HRS) Nasopharyngeal Nasopharyngeal Swab     Status: Abnormal   Collection Time: 06/07/2019 11:53 PM   Specimen: Nasopharyngeal Swab  Result Value Ref Range Status   SARS Coronavirus 2 POSITIVE (A) NEGATIVE Final    Comment: RESULT CALLED TO, READ BACK BY AND VERIFIED WITH: Berta Minor, RN AT [phone] ON 05/17/2019 BY SAINVILUS S (NOTE) SARS-CoV-2 target nucleic acids are DETECTED. The SARS-CoV-2 RNA is generally detectable in upper and lower respiratory specimens during the acute phase of infection. Positive results are indicative of active infection with  SARS-CoV-2. Clinical  correlation with patient history and other diagnostic information is necessary to determine patient infection status. Positive results do  not rule out bacterial infection or co-infection with other viruses. The expected result is Negative. Fact Sheet for Patients: SugarRoll.be Fact Sheet for Healthcare Providers: https://www.woods-mathews.com/ This test is not yet approved or cleared by the [place] FDA and  has been authorized for detection and/or diagnosis of SARS-CoV-2 by FDA under an Emergency Use Authorization (EUA). This EUA will remain  in effect (meaning this test c an be used) for the duration of the COVID-19 declaration under Section 564(b)(1)  of the Act, 21 U.S.C. section 360bbb-3(b)(1), unless the authorization is terminated or revoked sooner. Performed at [facility], [place] [address] [place], [place] [address]   Blood culture (routine x 2)     Status: None (Preliminary result)   Collection Time: 05/15/2019 11:53 PM   Specimen: BLOOD  Result Value Ref Range Status   Specimen Description BLOOD LEFT ANTECUBITAL  Final   Special Requests   Final    BOTTLES DRAWN AEROBIC AND ANAEROBIC Blood Culture results may not be optimal due to an inadequate volume of blood received in culture bottles   Culture   Final    NO GROWTH 3 DAYS Performed at [facility], [place] [address], [place], [place] [address]    Report Status PENDING  Incomplete  Blood culture (routine x 2)     Status: None (Preliminary result)   Collection Time: 05/17/19  1:56 AM   Specimen: BLOOD RIGHT FOREARM  Result Value Ref Range Status   Specimen Description BLOOD RIGHT FOREARM  Final   Special Requests   Final    BOTTLES DRAWN AEROBIC AND ANAEROBIC Blood Culture adequate volume   Culture   Final    NO GROWTH 3 DAYS Performed at [facility], [place] [address] [place], [place] [address]    Report Status PENDING  Incomplete   MRSA PCR Screening     Status: Abnormal   Collection Time: 05/18/19  6:12 AM   Specimen: Nasal Mucosa; Nasopharyngeal  Result Value Ref Range Status   MRSA by PCR POSITIVE (A) NEGATIVE Final    Comment:        The GeneXpert MRSA Assay (FDA approved for NASAL specimens only), is one component of a comprehensive MRSA colonization surveillance program. It is not intended to diagnose MRSA infection nor to guide or monitor treatment for MRSA infections. RESULT CALLED TO, READ BACK BY AND VERIFIED WITH: Hazeline Junker [id number] @ [id number] [place] Performed at [facility], [place] [address], [place],  [address]      Scheduled Meds: . acidophilus   Oral q morning - 10a  . aspirin EC  81 mg Oral QHS  . cephALEXin  250 mg Oral Q8H  . Chlorhexidine Gluconate Cloth  6 each Topical Q0600  . chlorpheniramine-HYDROcodone  5 mL Oral Q12H  . clopidogrel  75 mg Oral Daily  . dexamethasone (DECADRON) injection  6 mg Intravenous Q24H  . doxepin  25 mg Oral QHS  . ferrous sulfate  650 mg Oral Q breakfast  . heparin injection (subcutaneous)  5,000 Units Subcutaneous Q12H  . loratadine  5 mg Oral Daily  . mouth rinse  15 mL Mouth Rinse BID  . multivitamin  1 tablet Oral Daily  . multivitamin with minerals   Oral q morning - 10a  . mupirocin ointment  1 application Nasal BID  . omega-3 acid ethyl esters  1 g Oral Daily  . pantoprazole  40 mg Oral QHS  . pyridOXINE  100 mg Oral Daily  . simvastatin  10 mg Oral QHS  . sodium chloride flush  10-40 mL Intracatheter Q12H  . vitamin C  1,000 mg Oral Daily  . zinc sulfate  220 mg Oral Daily   Continuous Infusions: . remdesivir 100 mg in NS 250 mL 100 mg (05/20/19 0929)     LOS: 3 days   Cherene Altes, MD [facility] Office  [phone] Pager - Text Page per Shea Evans  If 7PM-7AM,  please contact night-coverage per Amion 05/20/2019, 6:09 PM

## 2019-05-20 DIAGNOSIS — J1289 Other viral pneumonia: Secondary | ICD-10-CM

## 2019-05-20 DIAGNOSIS — R0902 Hypoxemia: Secondary | ICD-10-CM

## 2019-05-20 LAB — COMPREHENSIVE METABOLIC PANEL
ALT: 26 U/L (ref 0–44)
AST: 38 U/L (ref 15–41)
Albumin: 2.1 g/dL — ABNORMAL LOW (ref 3.5–5.0)
Alkaline Phosphatase: 83 U/L (ref 38–126)
Anion gap: 10 (ref 5–15)
BUN: 72 mg/dL — ABNORMAL HIGH (ref 8–23)
CO2: 18 mmol/L — ABNORMAL LOW (ref 22–32)
Calcium: 8.4 mg/dL — ABNORMAL LOW (ref 8.9–10.3)
Chloride: 119 mmol/L — ABNORMAL HIGH (ref 98–111)
Creatinine, Ser: 1.73 mg/dL — ABNORMAL HIGH (ref 0.61–1.24)
GFR calc Af Amer: 39 mL/min — ABNORMAL LOW (ref >60)
GFR calc non Af Amer: 33 mL/min — ABNORMAL LOW (ref >60)
Glucose, Bld: 166 mg/dL — ABNORMAL HIGH (ref 70–99)
Potassium: 4.5 mmol/L (ref 3.5–5.1)
Sodium: 147 mmol/L — ABNORMAL HIGH (ref 135–145)
Total Bilirubin: 0.4 mg/dL (ref 0.3–1.2)
Total Protein: 6.5 g/dL (ref 6.5–8.1)

## 2019-05-20 LAB — FERRITIN: Ferritin: 1168 ng/mL — ABNORMAL HIGH (ref 24–336)

## 2019-05-20 LAB — C-REACTIVE PROTEIN: CRP: 37.1 mg/dL — ABNORMAL HIGH (ref <1.0)

## 2019-05-20 LAB — D-DIMER, QUANTITATIVE: D-Dimer, Quant: 3.19 ug/mL-FEU — ABNORMAL HIGH (ref 0.00–0.50)

## 2019-05-20 NOTE — Progress Notes (Addendum)
NAME:  Larry Huffman, MRN:  [id number], DOB:  06-Oct-1923, LOS: 3 ADMISSION DATE:  05/19/2019, CONSULTATION DATE: 05/19/2019 REFERRING MD: Hyman Hopes, CHIEF COMPLAINT: Hypoxia  Brief History   85 year old man admitted 11/4 for Covid pneumonia.  History of present illness   EMS was originally called for a fall to ground.  He is demented and was able to explain why felt unwell.  On further investigation in the emergency department he was found to be febrile with low oxygen saturation and a chest x-ray compatible with a diagnosis of Covid pneumonia which was later confirmed by nasal swab. CT head did not show any evidence of acute injuries.  There are old strokes.  He was transferred to [facility] and eventually progressed to the ICU because of increasing hypoxia.  Past Medical History   Past Medical History:  Diagnosis Date  . Acid reflux   . Arthritis   . CAD (coronary artery disease)   . Cancer (HCC) hx of skin cancer  . Constipation   . Coronary artery disease   . High cholesterol   . HOH (hard of hearing)    wears hearing aids, but still can not hear  . Hypertension   . Incontinence of urine   . Peripheral vascular disease ([place])    Remote left carotid endarterectomy  . Sleep apnea    CPAP   Past Surgical History:  Procedure Laterality Date  . 2-D echocardiogram  03/07/2008   Ejection fraction greater than 55%. Mild concentric left ventricular hypertrophy.LV relaxation. mildly dilated left atrium. Mild mitral regurgitation.   . ANAL FISSURE REPAIR    . CARDIAC CATHETERIZATION     X 2 stents  . COLON SURGERY     removed part of the small intestine  . COLONOSCOPY W/ POLYPECTOMY    . CORONARY STENT PLACEMENT    . ESOPHAGOGASTRODUODENOSCOPY N/A 01/16/2015   Procedure: ESOPHAGOGASTRODUODENOSCOPY (EGD);  Surgeon: Irene Shipper, MD;  Location: [facility] ENDOSCOPY;  Service: Endoscopy;  Laterality: N/A;  . EYE SURGERY     cataracts  . I&D EXTREMITY  06/28/2012   Procedure:  IRRIGATION AND DEBRIDEMENT EXTREMITY;  Surgeon: Newt Minion, MD;  Location: [place];  Service: Orthopedics;  Laterality: Right;  Debridement Wound Right Leg, VAC, Antibiotic Beads, Apply A-Cell  . IRRIGATION AND DEBRIDEMENT ABSCESS    . LAPAROTOMY    . LUMBAR LAMINECTOMY N/A 02/08/2014   Procedure: L3-4, L4-5 Decompression;  Surgeon: Marybelle Killings, MD;  Location: [place];  Service: Orthopedics;  Laterality: N/A;  L3-4, L4-5 Decompression  . NASAL SINUS SURGERY    . PROSTATE SURGERY    . [facility] Events   11/4-ad[facility] ED to [facility]. 11/6-transfer to ICU for increasing hypoxia and increased respiratory rate.  Consults:  PCCM  Procedures:  None  Significant Diagnostic Tests:  Chest x-ray 11/4 -patchy bilateral infiltrates consistent with Covid. CT head/cervical spine/face 11/4-5-old strokes but no acute injury.  Micro Data:  11/4 SARS-CoV-2 positive. 11/4 blood cultures-negative 11//4 urine culture-greater than 100,000 Klebsiella pneumonia (not clean-catch)  Antimicrobials:  Azithromycin 11/4-11/5 Ceftriaxone 11/4-11/5 Remdesivir 11/5-4-day course  Interim history/subjective:  Comfortable overnight.  Now off nonrebreather and successfully weaning high flow oxygen  Objective   Blood pressure 137/79, pulse 73, temperature 98.3 F (36.8 C), temperature source Oral, resp. rate (!) 22, height 5\' 7"  (1.702 m), weight 75.6 kg, SpO2 (!) 83 %.    FiO2 (%):  [100 %]  100 %   Intake/Output Summary (Last 24 hours) at 05/20/2019 1038 Last data filed at 05/20/2019 0900 Gross per 24 hour  Intake -  Output 745 ml  Net -745 ml   Filed Weights   05/19/19 1100  Weight: 75.6 kg    Examination: General: Frail elderly looking man.  In no distress.  Awake and calm. HENT: No scleral icterus.  Mucous membranes are moist.  Very hard of hearing Lungs: Crackles throughout both lung fields most prominently at the bases.  No accessory  muscle use on high flow heated nasal cannula. Cardiovascular: No JVD observed.  Extremities warm and well-perfused.  Heart sounds unremarkable. Abdomen: Soft and nontender Extremities: No edema.  Multiples skin ecchymoses. Neuro: Confused but no focal neurological deficits.  Calm and  redirectable.   GU: Condom catheter.  Resolved Hospital Problem list   Contaminated urine culture.  Assessment & Plan:  Critically ill due to hypoxemic respiratory failure requiring high flow nasal cannula. Appears to be improving. No signs of respiratory distress. Given age and underlying dementia, would not tolerate intubation well. -Continue high flow nasal cannula. -Intubated for only extreme respiratory distress. -We will revisit CODE STATUS with family.  COVID-19 pneumonia No evidence of superimposed pneumonia or heart failure -Complete 4-day course of remdesivir -Complete minimum of 5-day course of dexamethasone, high risk of steroid-induced delirium.  Would adjust duration of therapy based on clinical response.  Baseline dementia Presently calm At risk for worsening with steroid -As needed haloperidol to start.  History of prior coronary artery disease -Continue home secondary prevention including antiplatelet agents.   Best practice:  Diet: Swallow evaluation at bed Pain/Anxiety/Delirium protocol (if indicated): As needed Haldol VAP protocol (if indicated): Not indicated DVT prophylaxis: Heparin SQ twice daily GI prophylaxis: Not indicated Glucose control: Monitoring for now Mobility: Up to chair Code Status: Full code Family Communication: Family updated by nurse.  Dr. Lynetta Mare called  11/8 and updated daughter. Told patient holding his own but remains at high risk for intubation and would not likely do well if he were to get to that point. Disposition: ICU  Labs   CBC: Recent Labs  Lab 05/27/2019 2253 05/18/19 0102  WBC 16.2* 20.9*  NEUTROABS 12.9* 18.1*  HGB 10.7* 10.5*   HCT 33.2* 32.0*  MCV 98.2 95.8  PLT 246 [id number]    Basic Metabolic Panel: Recent Labs  Lab 06/06/2019 2253 05/18/19 0102 05/19/19 0625 05/20/19 0600  NA 137 141 144 147*  K 4.5 4.5 5.0 4.5  CL 108 108 113* 119*  CO2 17* 18* 15* 18*  GLUCOSE 121* 134* 135* 166*  BUN 33* 43* 63* 72*  CREATININE 1.87* 1.83* 1.72* 1.73*  CALCIUM 8.5* 8.6* 8.6* 8.4*   GFR: Estimated Creatinine Clearance: 24.9 mL/min (A) (by C-G formula based on SCr of 1.73 mg/dL (H)). Recent Labs  Lab 06/09/2019 2253 05/17/19 0016 05/17/19 0156 05/17/19 0302 05/18/19 0102  PROCALCITON  --   --   --  0.50  --   WBC 16.2*  --   --   --  20.9*  LATICACIDVEN  --  1.3 1.0  --   --     Liver Function Tests: Recent Labs  Lab 05/27/2019 2253 05/18/19 0102 05/19/19 0625 05/20/19 0600  AST 51* 43* 59* 38  ALT 33 29 29 26   ALKPHOS 85 85 88 83  BILITOT 0.3 0.8 0.5 0.4  PROT 7.1 7.4 6.8 6.5  ALBUMIN 2.8* 2.8* 2.5* 2.1*   No results for input(s): LIPASE,  AMYLASE in the last 168 hours. No results for input(s): AMMONIA in the last 168 hours.  ABG    Component Value Date/Time   HCO3 23.3 04/07/2007 1524   TCO2 18 03/07/2016 1952   ACIDBASEDEF 2.0 04/07/2007 1524     Coagulation Profile: No results for input(s): INR, PROTIME in the last 168 hours.  Cardiac Enzymes: No results for input(s): CKTOTAL, CKMB, CKMBINDEX, TROPONINI in the last 168 hours.  HbA1C: Hgb A1c MFr Bld  Date/Time Value Ref Range Status  05/29/2010 12:59 PM (H) <5.7 % Final   6.2 (NOTE)                                                                       According to the ADA Clinical Practice Recommendations for 2011, when HbA1c is used as a screening test:   >=6.5%   Diagnostic of Diabetes Mellitus           (if abnormal result  is confirmed)  5.7-6.4%   Increased risk of developing Diabetes Mellitus  References:Diagnosis and Classification of Diabetes Mellitus,Diabetes [id number] 1):S62-S69 and Standards of Medical Care in          Diabetes - 2011,Diabetes [id number]  (Suppl 1):S11-S61.    CBG: Recent Labs  Lab 05/18/19 2309  GLUCAP 118*    CRITICAL CARE Performed by: Kipp Brood   Total critical care time: 35 minutes  Critical care time was exclusive of separately billable procedures and treating other patients.  Critical care was necessary to treat or prevent imminent or life-threatening deterioration.  Critical care was time spent personally by me on the following activities: development of treatment plan with patient and/or surrogate as well as nursing, discussions with consultants, evaluation of patient's response to treatment, examination of patient, obtaining history from patient or surrogate, ordering and performing treatments and interventions, ordering and review of laboratory studies, ordering and review of radiographic studies, pulse oximetry, re-evaluation of patient's condition and participation in multidisciplinary rounds.  Kipp Brood, MD [facility] ICU Physician [place]  Pager: [phone] Mobile: [phone] After hours: [phone].

## 2019-05-20 NOTE — Progress Notes (Signed)
Patient's daughter, Clarene Critchley, called and given update on her father's condition. Code status was revisited, since the patient is on 100% FiO2 via HHFNC and non-rebreather mask. Daughter will discuss with her brother.

## 2019-05-20 NOTE — Significant Event (Signed)
Critical care attending:  Called daughter back after she discussed condition with RN.  Explained that given the severity of his pneumonia, age and progressive dementia, aggressive therapy including intubation and CPR/ACLS would inflict significant discomfort without offering realistic chance for recovery. Daughter acknowledged that patient has been declining from dementia over the past several years and that he would not wish to live in a state of increased dependency.  Code status changed to DNI/DNR.  Kipp Brood, MD [facility] ICU Physician [place]  Pager: [phone] Mobile: [phone] After hours: [phone].  05/20/2019, 5:28 PM

## 2019-05-21 ENCOUNTER — Inpatient Hospital Stay (HOSPITAL_COMMUNITY): Payer: Medicare HMO

## 2019-05-21 LAB — GLUCOSE, CAPILLARY: Glucose-Capillary: 155 mg/dL — ABNORMAL HIGH (ref 70–99)

## 2019-05-21 LAB — COMPREHENSIVE METABOLIC PANEL
ALT: 32 U/L (ref 0–44)
AST: 47 U/L — ABNORMAL HIGH (ref 15–41)
Albumin: 2.1 g/dL — ABNORMAL LOW (ref 3.5–5.0)
Alkaline Phosphatase: 101 U/L (ref 38–126)
Anion gap: 10 (ref 5–15)
BUN: 80 mg/dL — ABNORMAL HIGH (ref 8–23)
CO2: 17 mmol/L — ABNORMAL LOW (ref 22–32)
Calcium: 8.4 mg/dL — ABNORMAL LOW (ref 8.9–10.3)
Chloride: 122 mmol/L — ABNORMAL HIGH (ref 98–111)
Creatinine, Ser: 1.79 mg/dL — ABNORMAL HIGH (ref 0.61–1.24)
GFR calc Af Amer: 37 mL/min — ABNORMAL LOW (ref >60)
GFR calc non Af Amer: 32 mL/min — ABNORMAL LOW (ref >60)
Glucose, Bld: 192 mg/dL — ABNORMAL HIGH (ref 70–99)
Potassium: 4.5 mmol/L (ref 3.5–5.1)
Sodium: 149 mmol/L — ABNORMAL HIGH (ref 135–145)
Total Bilirubin: 0.7 mg/dL (ref 0.3–1.2)
Total Protein: 6.5 g/dL (ref 6.5–8.1)

## 2019-05-21 LAB — D-DIMER, QUANTITATIVE: D-Dimer, Quant: 3.12 ug/mL-FEU — ABNORMAL HIGH (ref 0.00–0.50)

## 2019-05-21 LAB — FERRITIN: Ferritin: 1747 ng/mL — ABNORMAL HIGH (ref 24–336)

## 2019-05-21 LAB — C-REACTIVE PROTEIN: CRP: 37.6 mg/dL — ABNORMAL HIGH (ref <1.0)

## 2019-05-21 MED ORDER — METOPROLOL TARTRATE 25 MG PO TABS: 50.0000 mg | ORAL_TABLET | Freq: Two times a day (BID) | ORAL | Status: DC

## 2019-05-21 MED ORDER — ACETAMINOPHEN 650 MG RE SUPP: 325.0000 mg | RECTAL | Status: DC | PRN

## 2019-05-21 MED ORDER — METOPROLOL TARTRATE 5 MG/5ML IV SOLN
2.5000 mg | Freq: Once | INTRAVENOUS | Status: AC
  Administered 2019-05-21: 2.5 mg via INTRAVENOUS
  Filled 2019-05-21: qty 5

## 2019-05-21 MED ORDER — DILTIAZEM HCL 30 MG PO TABS: 30.0000 mg | ORAL_TABLET | Freq: Three times a day (TID) | ORAL | Status: DC

## 2019-05-21 MED ORDER — MORPHINE SULFATE (PF) 2 MG/ML IV SOLN
1.0000 mg | INTRAVENOUS | Status: DC | PRN
  Administered 2019-05-21 – 2019-05-22 (×6): 2 mg via INTRAVENOUS
  Filled 2019-05-21 (×6): qty 1

## 2019-05-21 MED ORDER — METOPROLOL TARTRATE 25 MG PO TABS
25.0000 mg | ORAL_TABLET | Freq: Two times a day (BID) | ORAL | Status: DC
  Administered 2019-05-21: 25 mg via ORAL
  Filled 2019-05-21: qty 1

## 2019-05-21 MED ORDER — PANTOPRAZOLE SODIUM 40 MG IV SOLR
40.0000 mg | INTRAVENOUS | Status: DC
  Administered 2019-05-21: 40 mg via INTRAVENOUS
  Filled 2019-05-21: qty 40

## 2019-05-21 MED ORDER — TRAMADOL HCL 50 MG PO TABS: 50.0000 mg | ORAL_TABLET | Freq: Four times a day (QID) | ORAL | Status: DC | PRN

## 2019-05-21 MED ORDER — METOPROLOL TARTRATE 5 MG/5ML IV SOLN
5.0000 mg | Freq: Once | INTRAVENOUS | Status: AC
  Administered 2019-05-21: 5 mg via INTRAVENOUS
  Filled 2019-05-21: qty 5

## 2019-05-21 MED ORDER — METOPROLOL TARTRATE 5 MG/5ML IV SOLN
5.0000 mg | Freq: Four times a day (QID) | INTRAVENOUS | Status: DC
  Administered 2019-05-21 – 2019-05-22 (×3): 5 mg via INTRAVENOUS
  Filled 2019-05-21 (×3): qty 5

## 2019-05-21 MED ORDER — HYDROCOD POLST-CPM POLST ER 10-8 MG/5ML PO SUER: 5.0000 mL | Freq: Two times a day (BID) | ORAL | Status: DC | PRN

## 2019-05-21 NOTE — Progress Notes (Addendum)
NAME:  Larry Huffman, MRN:  [id number], DOB:  [birth year], LOS: 4 ADMISSION DATE:  06/04/2019, CONSULTATION DATE: 05/19/2019 REFERRING MD: Hyman Hopes, CHIEF COMPLAINT: Hypoxia  Brief History   85 year old man admitted 11/4 for Covid pneumonia.  History of present illness   EMS was originally called for a fall to ground.  He is demented and was able to explain why felt unwell.  On further investigation in the emergency department he was found to be febrile with low oxygen saturation and a chest x-ray compatible with a diagnosis of Covid pneumonia which was later confirmed by nasal swab. CT head did not show any evidence of acute injuries.  There are old strokes.  He was transferred to [facility] and eventually progressed to the ICU because of increasing hypoxia.  Past Medical History   Past Medical History:  Diagnosis Date  . Acid reflux   . Arthritis   . CAD (coronary artery disease)   . Cancer (HCC) hx of skin cancer  . Constipation   . Coronary artery disease   . High cholesterol   . HOH (hard of hearing)    wears hearing aids, but still can not hear  . Hypertension   . Incontinence of urine   . Peripheral vascular disease ([place])    Remote left carotid endarterectomy  . Sleep apnea    CPAP   Past Surgical History:  Procedure Laterality Date  . 2-D echocardiogram  03/07/2008   Ejection fraction greater than 55%. Mild concentric left ventricular hypertrophy.LV relaxation. mildly dilated left atrium. Mild mitral regurgitation.   . ANAL FISSURE REPAIR    . CARDIAC CATHETERIZATION     X 2 stents  . COLON SURGERY     removed part of the small intestine  . COLONOSCOPY W/ POLYPECTOMY    . CORONARY STENT PLACEMENT    . ESOPHAGOGASTRODUODENOSCOPY N/A 01/16/2015   Procedure: ESOPHAGOGASTRODUODENOSCOPY (EGD);  Surgeon: Irene Shipper, MD;  Location: [facility] ENDOSCOPY;  Service: Endoscopy;  Laterality: N/A;  . EYE SURGERY     cataracts  . I&D EXTREMITY  06/28/2012   Procedure:  IRRIGATION AND DEBRIDEMENT EXTREMITY;  Surgeon: Newt Minion, MD;  Location: [place];  Service: Orthopedics;  Laterality: Right;  Debridement Wound Right Leg, VAC, Antibiotic Beads, Apply A-Cell  . IRRIGATION AND DEBRIDEMENT ABSCESS    . LAPAROTOMY    . LUMBAR LAMINECTOMY N/A 02/08/2014   Procedure: L3-4, L4-5 Decompression;  Surgeon: Marybelle Killings, MD;  Location: [place];  Service: Orthopedics;  Laterality: N/A;  L3-4, L4-5 Decompression  . NASAL SINUS SURGERY    . PROSTATE SURGERY    . [place] [facility] Events   11/4-[facility] ED to [facility]. 11/6-transfer to ICU for increasing hypoxia and increased respiratory rate. 11/9-weaned off high flow  Consults:  PCCM  Procedures:  None  Significant Diagnostic Tests:  Chest x-ray 11/4 -patchy bilateral infiltrates consistent with Covid. CT head/cervical spine/face 11/4-5-old strokes but no acute injury.  Micro Data:  11/4 SARS-CoV-2 positive. 11/4 blood cultures-negative 11//4 urine culture-greater than 100,000 Klebsiella pneumonia (not clean-catch)  Antimicrobials:  Azithromycin 11/4-11/5 Ceftriaxone 11/4-11/5 Remdesivir 11/5-4-day course  Interim history/subjective:  Comfortable overnight.  Now off heated high flow O2. Ate most of his breakfast this morning  Objective   Blood pressure (!) 150/57, pulse 86, temperature 99.2 F (37.3 C), temperature source Axillary, resp. rate (!) 21, height 5\' 7"  (1.702 m), weight 75.6 kg, SpO2 92 %.  FiO2 (%):  [80 %-100 %] 80 %   Intake/Output Summary (Last 24 hours) at 05/21/2019 [id number] Last data filed at 05/21/2019 0500 Gross per 24 hour  Intake 550 ml  Output 576 ml  Net -26 ml   Filed Weights   05/19/19 1100  Weight: 75.6 kg    Examination: General: Frail elderly looking man.  In no distress.  Awake and calm. HENT: No scleral icterus.  Mucous membranes are moist.  Very hard of hearing Lungs: Crackles throughout both lung fields most  prominently at the bases.  No accessory muscle use on high flow heated nasal cannula. Cardiovascular: No JVD observed.  Extremities warm and well-perfused.  Heart sounds unremarkable.  Heart rate irregularly irregular. Abdomen: Soft and nontender Extremities: No edema.  Multiples skin ecchymoses. Neuro: Confused but no focal neurological deficits.  Calm and  redirectable.  Masklike facies but no cogwheel rigidity. GU: Condom catheter.  Resolved Hospital Problem list   Contaminated urine culture.  Assessment & Plan:  Critically ill due to hypoxemic respiratory failure requiring high flow nasal cannula. Appears to be improving. No signs of respiratory distress. Given age and underlying dementia, would not tolerate intubation well. -Continue oxygen support -Now DNR/DNI -Still does not appear volume overloaded, therefore no diuretic -Would benefit from mobilization, PT consultation.  COVID-19 pneumonia No evidence of superimposed pneumonia or heart failure -Complete 4-day course of remdesivir -Complete minimum of 5-day course of dexamethasone, high risk of steroid-induced delirium.  Would adjust duration of therapy based on clinical response.  New atrial fibrillation in the context of acute illness. No prior history. -Attempt to rate control/convert with beta-blocker -Consider anticoagulation if persists, however low per day risk of stroke while in hospital and high fall/bleeding risk.  Not ideal anticoagulation candidate as outpatient.  Baseline dementia Presently calm At risk for worsening with steroid -He is not able to comply with incentive spirometer, so will need to be aggressively encouraged to mobilize. -As needed haloperidol to start.  History of prior coronary artery disease -Continue home secondary prevention including antiplatelet agents.   Best practice:  Diet: Swallow evaluation at bed Pain/Anxiety/Delirium protocol (if indicated): As needed Haldol VAP protocol  (if indicated): Not indicated DVT prophylaxis: Heparin SQ twice daily GI prophylaxis: Not indicated Glucose control: Monitoring for now Mobility: Up to chair Code Status: DNR/DNI Family Communication: Family updated by nurse.  Dr. Lynetta Mare called  11/8 and updated daughter. Told patient holding his own but remains at high risk for intubation and would not likely do well if he were to get to that point. She agreed to change in code status Disposition: ICU  Labs   CBC: Recent Labs  Lab 05/31/2019 2253 05/18/19 0102  WBC 16.2* 20.9*  NEUTROABS 12.9* 18.1*  HGB 10.7* 10.5*  HCT 33.2* 32.0*  MCV 98.2 95.8  PLT 246 [id number]    Basic Metabolic Panel: Recent Labs  Lab 05/26/2019 2253 05/18/19 0102 05/19/19 0625 05/20/19 0600 05/21/19 0623  NA 137 141 144 147* 149*  K 4.5 4.5 5.0 4.5 4.5  CL 108 108 113* 119* 122*  CO2 17* 18* 15* 18* 17*  GLUCOSE 121* 134* 135* 166* 192*  BUN 33* 43* 63* 72* 80*  CREATININE 1.87* 1.83* 1.72* 1.73* 1.79*  CALCIUM 8.5* 8.6* 8.6* 8.4* 8.4*   GFR: Estimated Creatinine Clearance: 24.1 mL/min (A) (by C-G formula based on SCr of 1.79 mg/dL (H)). Recent Labs  Lab 05/14/2019 2253 05/17/19 0016 05/17/19 0156 05/17/19 0302 05/18/19 0102  PROCALCITON  --   --   --  0.50  --   WBC 16.2*  --   --   --  20.9*  LATICACIDVEN  --  1.3 1.0  --   --     Liver Function Tests: Recent Labs  Lab 06/08/2019 2253 05/18/19 0102 05/19/19 0625 05/20/19 0600 05/21/19 0623  AST 51* 43* 59* 38 47*  ALT 33 29 29 26  32  ALKPHOS 85 85 88 83 101  BILITOT 0.3 0.8 0.5 0.4 0.7  PROT 7.1 7.4 6.8 6.5 6.5  ALBUMIN 2.8* 2.8* 2.5* 2.1* 2.1*   No results for input(s): LIPASE, AMYLASE in the last 168 hours. No results for input(s): AMMONIA in the last 168 hours.  ABG    Component Value Date/Time   HCO3 23.3 04/07/2007 1524   TCO2 18 03/07/2016 1952   ACIDBASEDEF 2.0 04/07/2007 1524     Coagulation Profile: No results for input(s): INR, PROTIME in the last 168  hours.  Cardiac Enzymes: No results for input(s): CKTOTAL, CKMB, CKMBINDEX, TROPONINI in the last 168 hours.  HbA1C: Hgb A1c MFr Bld  Date/Time Value Ref Range Status  05/29/2010 12:59 PM (H) <5.7 % Final   6.2 (NOTE)                                                                       According to the ADA Clinical Practice Recommendations for 2011, when HbA1c is used as a screening test:   >=6.5%   Diagnostic of Diabetes Mellitus           (if abnormal result  is confirmed)  5.7-6.4%   Increased risk of developing Diabetes Mellitus  References:Diagnosis and Classification of Diabetes Mellitus,Diabetes [id number] 1):S62-S69 and Standards of Medical Care in         Diabetes - 2011,Diabetes [id number]  (Suppl 1):S11-S61.    CBG: Recent Labs  Lab 05/18/19 Lesslie Lakenzie Mcclafferty, MD [facility] ICU Physician [place]  Pager: [phone] Mobile: [phone] After hours: [phone].

## 2019-05-21 NOTE — Progress Notes (Signed)
OT Cancellation Note  Patient Details Name: Larry Huffman MRN: [id number] DOB: 1923/07/22   Cancelled Treatment:    Reason Eval/Treat Not Completed: Medical issues which prohibited therapy; pt now in ICU, on HFNC and NRB. Will follow up for OT treatment as able.  Lou Cal, OT [facility] Pager [phone] Office [phone]   Raymondo Band 05/21/2019, 3:38 PM

## 2019-05-21 NOTE — Progress Notes (Signed)
Daughter updated tonight of pt's condition. All questions were answered.

## 2019-05-21 NOTE — Progress Notes (Signed)
Updated family today and Face Time assistance provided w/ son and daughter n law Jefferson Hebeler, [phone]

## 2019-05-21 NOTE — Progress Notes (Signed)
PT Cancellation Note  Patient Details Name: Larry Huffman MRN: [id number] DOB: 06-25-1924   Cancelled Treatment:     transferred to ICU, on NRB.    Claretha Cooper 05/21/2019, 2:48 PM  [place] Pager [phone] Office [phone]

## 2019-05-21 NOTE — Progress Notes (Addendum)
Larry Huffman  [id number] DOB: June 18, 1924 DOA: 05/30/2019 PCP: Lawerance Cruel, MD    Brief Narrative:  [phone] with a history of CAD status post PCI, HLD, HTN, dementia, and PVD who presented after a fall and head injury. At home patient sustained a mechanical fall while getting out of his bed. His ROS noted fevers and chills for 24 hours. He was found to be tachycardic and tachypneic, w/ sat of 90% on room air. He was SARS CoV2 positive. A CXR noted bilateral reticular nodular infiltrates, right upper lobe and left lower lobe. Patient was admitted for acute hypoxic respiratory failure due to COVID pneumonia.  Significant Events: 11/5 admit to [facility] via [facility] ED 11/6 transfer to ICU with worsening hypoxic failure  COVID-19 specific Treatment: Remdesivir 11/5 > Decadron 11/5 >  Subjective: The patient has been titrated off heated high flow and is now being maintained with nonrebreather mask only.  His respirations however remain quite tenuous.  He is confused and unable to communicate reliably.  Assessment & Plan:  Covid pneumonia - acute hypoxic respiratory failure Currently requiring only nonrebreather facemask, but has high risk of declining again - watch in ICU for now   Dementia Family reports consistent decline over the last year plus in cognitive state and quality of life - low threshold to transition to comfort focused care if he declines acutely   Head trauma secondary to mechanical fall CT head negative for intracranial hemorrhage  Acute kidney injury on CKD stage III Creatinine holding stable for now   Recent Labs  Lab 05/21/2019 2253 05/18/19 0102 05/19/19 0625 05/20/19 0600 05/21/19 0623  CREATININE 1.87* 1.83* 1.72* 1.73* 1.79*   Klebsiella pneumoniae UTI completed 5 days of antibiotic therapy - afebrile   New atrial fibrillation in the context of acute illness Rate control proving challenging - given advanced age and recent head trauma will avoid  anticoagulation -titrate meds and follow  CAD Continue usual medical care  HLD Continue usual home meds  Iron deficiency anemia Hemoglobin stable presently with no evidence of gross blood loss  DVT prophylaxis: SQ heparin  Code Status: DNR - NO CODE  Family Communication: Per PCCM Disposition Plan: ICU  Consultants:  PCCM  Antimicrobials:  Azithromycin 11/4 > 11/5 Ceftriaxone 11/4 > 11/5 Cephalexin 11/6 > 11/8  Objective: Blood pressure (!) 95/50, pulse (!) 109, temperature 99.2 F (37.3 C), temperature source Axillary, resp. rate (!) 24, height 5\' 7"  (1.702 m), weight 75.6 kg, SpO2 94 %.  Intake/Output Summary (Last 24 hours) at 05/21/2019 0935 Last data filed at 05/21/2019 0500 Gross per 24 hour  Intake 300 ml  Output 576 ml  Net -276 ml   Filed Weights   05/19/19 1100  Weight: 75.6 kg    Examination: General: Requires facemask oxygen -confused but alert Lungs: Fine diffuse crackles without wheezing Cardiovascular: Tachycardic -irregular -no murmur Abdomen: Soft, BS positive, no rebound Extremities: No edema bilateral lower extremities  CBC: Recent Labs  Lab 05/25/2019 2253 05/18/19 0102  WBC 16.2* 20.9*  NEUTROABS 12.9* 18.1*  HGB 10.7* 10.5*  HCT 33.2* 32.0*  MCV 98.2 95.8  PLT 246 [id number]   Basic Metabolic Panel: Recent Labs  Lab 05/19/19 0625 05/20/19 0600 05/21/19 0623  NA 144 147* 149*  K 5.0 4.5 4.5  CL 113* 119* 122*  CO2 15* 18* 17*  GLUCOSE 135* 166* 192*  BUN 63* 72* 80*  CREATININE 1.72* 1.73* 1.79*  CALCIUM 8.6* 8.4* 8.4*   GFR: Estimated Creatinine  Clearance: 24.1 mL/min (A) (by C-G formula based on SCr of 1.79 mg/dL (H)).  Liver Function Tests: Recent Labs  Lab 05/18/19 0102 05/19/19 0625 05/20/19 0600 05/21/19 0623  AST 43* 59* 38 47*  ALT 29 29 26  32  ALKPHOS 85 88 83 101  BILITOT 0.8 0.5 0.4 0.7  PROT 7.4 6.8 6.5 6.5  ALBUMIN 2.8* 2.5* 2.1* 2.1*    HbA1C: Hgb A1c MFr Bld  Date/Time Value Ref Range Status   05/29/2010 12:59 PM (H) <5.7 % Final   6.2 (NOTE)                                                                       According to the ADA Clinical Practice Recommendations for 2011, when HbA1c is used as a screening test:   >=6.5%   Diagnostic of Diabetes Mellitus           (if abnormal result  is confirmed)  5.7-6.4%   Increased risk of developing Diabetes Mellitus  References:Diagnosis and Classification of Diabetes Mellitus,Diabetes [id number] 1):S62-S69 and Standards of Medical Care in         Diabetes - 2011,Diabetes Care,2011,34  (Suppl 1):S11-S61.    CBG: Recent Labs  Lab 05/18/19 2309  GLUCAP 118*    Recent Results (from the past 240 hour(s))  Urine Culture     Status: Abnormal   Collection Time: 05/26/2019  8:16 AM   Specimen: Urine, Random  Result Value Ref Range Status   Specimen Description URINE, RANDOM  Final   Special Requests   Final    NONE Performed at [facility], [address], [place], [place] [address]    Culture >=100,000 COLONIES/mL KLEBSIELLA PNEUMONIAE (A)  Final   Report Status 05/19/2019 FINAL  Final   Organism ID, Bacteria KLEBSIELLA PNEUMONIAE (A)  Final      Susceptibility   Klebsiella pneumoniae - MIC*    AMPICILLIN >=32 RESISTANT Resistant     CEFAZOLIN <=4 SENSITIVE Sensitive     CEFTRIAXONE <=1 SENSITIVE Sensitive     CIPROFLOXACIN <=0.25 SENSITIVE Sensitive     GENTAMICIN <=1 SENSITIVE Sensitive     IMIPENEM <=0.25 SENSITIVE Sensitive     NITROFURANTOIN 64 INTERMEDIATE Intermediate     TRIMETH/SULFA <=20 SENSITIVE Sensitive     AMPICILLIN/SULBACTAM 4 SENSITIVE Sensitive     PIP/TAZO <=4 SENSITIVE Sensitive     Extended ESBL NEGATIVE Sensitive     * >=100,000 COLONIES/mL KLEBSIELLA PNEUMONIAE  SARS CORONAVIRUS 2 (TAT 6-24 HRS) Nasopharyngeal Nasopharyngeal Swab     Status: Abnormal   Collection Time: 05/26/2019 11:53 PM   Specimen: Nasopharyngeal Swab  Result Value Ref Range Status   SARS Coronavirus 2 POSITIVE (A)  NEGATIVE Final    Comment: RESULT CALLED TO, READ BACK BY AND VERIFIED WITH: Berta Minor, RN AT [phone] ON 05/17/2019 BY SAINVILUS S (NOTE) SARS-CoV-2 target nucleic acids are DETECTED. The SARS-CoV-2 RNA is generally detectable in upper and lower respiratory specimens during the acute phase of infection. Positive results are indicative of active infection with SARS-CoV-2. Clinical  correlation with patient history and other diagnostic information is necessary to determine patient infection status. Positive results do  not rule out bacterial infection or co-infection with other viruses. The expected result  is Negative. Fact Sheet for Patients: SugarRoll.be Fact Sheet for Healthcare Providers: https://www.woods-mathews.com/ This test is not yet approved or cleared by the [place] FDA and  has been authorized for detection and/or diagnosis of SARS-CoV-2 by FDA under an Emergency Use Authorization (EUA). This EUA will remain  in effect (meaning this test c an be used) for the duration of the COVID-19 declaration under Section 564(b)(1) of the Act, 21 U.S.C. section 360bbb-3(b)(1), unless the authorization is terminated or revoked sooner. Performed at [facility], [place] [address], [place], [place] [address]   Blood culture (routine x 2)     Status: None (Preliminary result)   Collection Time: 05/21/2019 11:53 PM   Specimen: BLOOD  Result Value Ref Range Status   Specimen Description BLOOD LEFT ANTECUBITAL  Final   Special Requests   Final    BOTTLES DRAWN AEROBIC AND ANAEROBIC Blood Culture results may not be optimal due to an inadequate volume of blood received in culture bottles   Culture   Final    NO GROWTH 4 DAYS Performed at [facility], [place] [address], [place], [place] [address]    Report Status PENDING  Incomplete  Blood culture (routine x 2)     Status: None (Preliminary result)   Collection Time: 05/17/19  1:56 AM    Specimen: BLOOD RIGHT FOREARM  Result Value Ref Range Status   Specimen Description BLOOD RIGHT FOREARM  Final   Special Requests   Final    BOTTLES DRAWN AEROBIC AND ANAEROBIC Blood Culture adequate volume   Culture   Final    NO GROWTH 4 DAYS Performed at [facility], [place] [address] [place], [place] [address]    Report Status PENDING  Incomplete  MRSA PCR Screening     Status: Abnormal   Collection Time: 05/18/19  6:12 AM   Specimen: Nasal Mucosa; Nasopharyngeal  Result Value Ref Range Status   MRSA by PCR POSITIVE (A) NEGATIVE Final    Comment:        The GeneXpert MRSA Assay (FDA approved for NASAL specimens only), is one component of a comprehensive MRSA colonization surveillance program. It is not intended to diagnose MRSA infection nor to guide or monitor treatment for MRSA infections. RESULT CALLED TO, READ BACK BY AND VERIFIED WITH: Hazeline Junker [id number] @ [id number] [place] Performed at [facility], [place] [address] [place], [place] [address]      Scheduled Meds: . acidophilus   Oral q morning - 10a  . aspirin EC  81 mg Oral QHS  . Chlorhexidine Gluconate Cloth  6 each Topical Q0600  . chlorpheniramine-HYDROcodone  5 mL Oral Q12H  . clopidogrel  75 mg Oral Daily  . dexamethasone (DECADRON) injection  6 mg Intravenous Q24H  . doxepin  25 mg Oral QHS  . ferrous sulfate  650 mg Oral Q breakfast  . heparin injection (subcutaneous)  5,000 Units Subcutaneous Q12H  . loratadine  5 mg Oral Daily  . mouth rinse  15 mL Mouth Rinse BID  . metoprolol tartrate  2.5 mg Intravenous Once  . metoprolol tartrate  25 mg Oral BID  . multivitamin  1 tablet Oral Daily  . multivitamin with minerals   Oral q morning - 10a  . mupirocin ointment  1 application Nasal BID  . omega-3 acid ethyl esters  1 g Oral Daily  . pantoprazole  40 mg Oral QHS  . pyridOXINE  100 mg Oral Daily  .  simvastatin  10 mg Oral QHS  . sodium chloride flush  10-40 mL  Intracatheter Q12H  . vitamin C  1,000 mg Oral Daily  . zinc sulfate  220 mg Oral Daily   Continuous Infusions: . remdesivir 100 mg in NS 250 mL 100 mg (05/20/19 0929)     LOS: 4 days   Cherene Altes, MD [facility] Office  [phone] Pager - Text Page per Shea Evans  If 7PM-7AM, please contact night-coverage per Amion 05/21/2019, 9:35 AM

## 2019-05-22 LAB — CULTURE, BLOOD (ROUTINE X 2)
Culture: NO GROWTH
Culture: NO GROWTH
Special Requests: ADEQUATE

## 2019-05-22 LAB — BASIC METABOLIC PANEL
Anion gap: 9 (ref 5–15)
BUN: 79 mg/dL — ABNORMAL HIGH (ref 8–23)
CO2: 19 mmol/L — ABNORMAL LOW (ref 22–32)
Calcium: 8.4 mg/dL — ABNORMAL LOW (ref 8.9–10.3)
Chloride: 126 mmol/L — ABNORMAL HIGH (ref 98–111)
Creatinine, Ser: 1.87 mg/dL — ABNORMAL HIGH (ref 0.61–1.24)
GFR calc Af Amer: 35 mL/min — ABNORMAL LOW (ref >60)
GFR calc non Af Amer: 30 mL/min — ABNORMAL LOW (ref >60)
Glucose, Bld: 195 mg/dL — ABNORMAL HIGH (ref 70–99)
Potassium: 4.4 mmol/L (ref 3.5–5.1)
Sodium: 154 mmol/L — ABNORMAL HIGH (ref 135–145)

## 2019-05-22 LAB — CBC WITH DIFFERENTIAL/PLATELET
Abs Immature Granulocytes: 0.94 10*3/uL — ABNORMAL HIGH (ref 0.00–0.07)
Basophils Absolute: 0.1 10*3/uL (ref 0.0–0.1)
Basophils Relative: 0 %
Eosinophils Absolute: 0 10*3/uL (ref 0.0–0.5)
Eosinophils Relative: 0 %
HCT: 32.9 % — ABNORMAL LOW (ref 39.0–52.0)
Hemoglobin: 10.9 g/dL — ABNORMAL LOW (ref 13.0–17.0)
Immature Granulocytes: 3 %
Lymphocytes Relative: 4 %
Lymphs Abs: 1.5 10*3/uL (ref 0.7–4.0)
MCH: 31.5 pg (ref 26.0–34.0)
MCHC: 33.1 g/dL (ref 30.0–36.0)
MCV: 95.1 fL (ref 80.0–100.0)
Monocytes Absolute: 1 10*3/uL (ref 0.1–1.0)
Monocytes Relative: 3 %
Neutro Abs: 31.7 10*3/uL — ABNORMAL HIGH (ref 1.7–7.7)
Neutrophils Relative %: 90 %
Platelets: 366 10*3/uL (ref 150–400)
RBC: 3.46 MIL/uL — ABNORMAL LOW (ref 4.22–5.81)
RDW: 15.6 % — ABNORMAL HIGH (ref 11.5–15.5)
WBC: 35.4 10*3/uL — ABNORMAL HIGH (ref 4.0–10.5)
nRBC: 0 % (ref 0.0–0.2)

## 2019-05-22 LAB — MAGNESIUM: Magnesium: 2.8 mg/dL — ABNORMAL HIGH (ref 1.7–2.4)

## 2019-05-22 MED ORDER — OXYCODONE HCL 20 MG/ML PO CONC: 5.0000 mg | ORAL | Status: DC | PRN

## 2019-05-22 MED ORDER — HALOPERIDOL LACTATE 2 MG/ML PO CONC
0.5000 mg | ORAL | Status: DC | PRN
  Filled 2019-05-22: qty 0.3

## 2019-05-22 MED ORDER — GLYCOPYRROLATE 0.2 MG/ML IJ SOLN: 0.2000 mg | INTRAMUSCULAR | Status: DC | PRN

## 2019-05-22 MED ORDER — CHLORHEXIDINE GLUCONATE CLOTH 2 % EX PADS: 6.0000 | MEDICATED_PAD | Freq: Every day | CUTANEOUS | Status: DC

## 2019-05-22 MED ORDER — LORAZEPAM 2 MG/ML PO CONC: 1.0000 mg | ORAL | Status: DC | PRN

## 2019-05-22 MED ORDER — BISACODYL 10 MG RE SUPP
10.0000 mg | Freq: Every day | RECTAL | Status: DC | PRN
  Filled 2019-05-22: qty 1

## 2019-05-22 MED ORDER — ACETAMINOPHEN 325 MG PO TABS: 650.0000 mg | ORAL_TABLET | Freq: Four times a day (QID) | ORAL | Status: DC | PRN

## 2019-05-22 MED ORDER — ONDANSETRON 4 MG PO TBDP: 4.0000 mg | ORAL_TABLET | Freq: Four times a day (QID) | ORAL | Status: DC | PRN

## 2019-05-22 MED ORDER — ONDANSETRON HCL 4 MG/2ML IJ SOLN: 4.0000 mg | Freq: Four times a day (QID) | INTRAMUSCULAR | Status: DC | PRN

## 2019-05-22 MED ORDER — GLYCOPYRROLATE 1 MG PO TABS
1.0000 mg | ORAL_TABLET | ORAL | Status: DC | PRN
  Filled 2019-05-22: qty 1

## 2019-05-22 MED ORDER — LORAZEPAM 0.5 MG PO TABS: 1.0000 mg | ORAL_TABLET | ORAL | Status: DC | PRN

## 2019-05-22 MED ORDER — ACETAMINOPHEN 650 MG RE SUPP: 650.0000 mg | Freq: Four times a day (QID) | RECTAL | Status: DC | PRN

## 2019-05-22 MED ORDER — LORAZEPAM 2 MG/ML IJ SOLN: 1.0000 mg | INTRAMUSCULAR | Status: DC | PRN

## 2019-05-22 MED ORDER — HALOPERIDOL LACTATE 5 MG/ML IJ SOLN: 0.5000 mg | INTRAMUSCULAR | Status: DC | PRN

## 2019-05-22 MED ORDER — DIPHENHYDRAMINE HCL 50 MG/ML IJ SOLN: 12.5000 mg | INTRAMUSCULAR | Status: DC | PRN

## 2019-05-22 MED ORDER — MORPHINE BOLUS VIA INFUSION
1.0000 mg | INTRAVENOUS | Status: DC | PRN
  Filled 2019-05-22: qty 2

## 2019-05-22 MED ORDER — HALOPERIDOL 0.5 MG PO TABS
0.5000 mg | ORAL_TABLET | ORAL | Status: DC | PRN
  Filled 2019-05-22: qty 1

## 2019-05-22 MED ORDER — MORPHINE 100MG IN NS 100ML (1MG/ML) PREMIX INFUSION
1.0000 mg/h | INTRAVENOUS | Status: DC
  Administered 2019-05-22: 1 mg/h via INTRAVENOUS
  Filled 2019-05-22: qty 100

## 2019-06-12 NOTE — Progress Notes (Signed)
Larry Huffman  [id number] DOB: 19-Jun-1924 DOA: 05/23/2019 PCP: Lawerance Cruel, MD    Brief Narrative:  [phone] with a history of CAD status post PCI, HLD, HTN, dementia, and PVD who presented after a fall and head injury. At home patient sustained a mechanical fall while getting out of his bed. His ROS noted fevers and chills for 24 hours. He was found to be tachycardic and tachypneic, w/ sat of 90% on room air. He was SARS CoV2 positive. A CXR noted bilateral reticular nodular infiltrates, right upper lobe and left lower lobe. Patient was admitted for acute hypoxic respiratory failure due to COVID pneumonia.  Significant Events: 11/5 admit to [facility] via [facility] ED 11/6 transfer to ICU with worsening hypoxic failure  COVID-19 specific Treatment: Remdesivir 11/5 > Decadron 11/5 >  Subjective: Prolonged QTC of 504 noted last night.  Night shift noted significant restlessness and agitation throughout the entire night.  His respiratory mechanics remain dysfunctional.  He is struggling to breathe.  He is refusing to take oral medications or even nutrition.  He is in a clear state of decline.  I called and spoke with the patient's daughter via telephone.  I explained to her that we had done everything that we could in the attempt to affect improvement in his pulmonary status but that he was declining at increasing rate.  I explained to her that there was nothing else that we had to offer at this time and that I felt that he was uncomfortable.  I explained that I felt full comfort care was the most appropriate intervention and the only effective intervention we had to offer at this time.  She voiced acceptance and understanding.  Assessment & Plan:  Covid pneumonia - acute hypoxic respiratory failure Unfortunately Mr. Guitron continues to get worse despite our best efforts to treat him -at this time we are transitioning to comfort focused care only  Dementia Family reports consistent decline  over the last year in cognitive state and quality of life   Head trauma secondary to mechanical fall CT head negative for intracranial hemorrhage  Acute kidney injury on CKD stage III Creatinine climbing  Recent Labs  Lab 05/18/19 0102 05/19/19 0625 05/20/19 0600 05/21/19 0623 2019-06-10 0420  CREATININE 1.83* 1.72* 1.73* 1.79* 1.87*   Klebsiella pneumoniae UTI completed 5 days of antibiotic therapy - afebrile   New atrial fibrillation in the context of acute illness Rate control proving challenging - given advanced age and recent head trauma avoided anticoagulation   CAD  HLD  Iron deficiency anemia Hemoglobin stable presently with no evidence of gross blood loss  DVT prophylaxis: Comfort care Code Status: DNR - NO CODE  Family Communication: Spoke with daughter via telephone 11/10 Disposition Plan: Transition to morphine drip and monitor -consider transfer to PCU if death not clearly eminent  Consultants:  PCCM  Antimicrobials:  Azithromycin 11/4 > 11/5 Ceftriaxone 11/4 > 11/5 Cephalexin 11/6 > 11/8  Objective: Blood pressure (!) 157/86, pulse 81, temperature 98.1 F (36.7 C), temperature source Axillary, resp. rate (!) 22, height 5\' 7"  (1.702 m), weight 75.6 kg, SpO2 92 %.  Intake/Output Summary (Last 24 hours) at 06-10-19 [id number] Last data filed at June 10, 2019 0700 Gross per 24 hour  Intake 775.77 ml  Output 1600 ml  Net -824.23 ml   Filed Weights   05/19/19 1100  Weight: 75.6 kg    Examination: General: Opens eyes but does not communicate with the examiner -respiratory distress appreciable Lungs: Coarse  diffuse crackles persist Cardiovascular: Tachycardic -irregular Abdomen: Soft, BS not appreciated Extremities: No edema B LE  CBC: Recent Labs  Lab 05/25/2019 2253 05/18/19 0102 Jun 19, 2019 0420  WBC 16.2* 20.9* 35.4*  NEUTROABS 12.9* 18.1* 31.7*  HGB 10.7* 10.5* 10.9*  HCT 33.2* 32.0* 32.9*  MCV 98.2 95.8 95.1  PLT 246 291 [id number]   Basic  Metabolic Panel: Recent Labs  Lab 05/20/19 0600 05/21/19 0623 Jun 19, 2019 0420  NA 147* 149* 154*  K 4.5 4.5 4.4  CL 119* 122* 126*  CO2 18* 17* 19*  GLUCOSE 166* 192* 195*  BUN 72* 80* 79*  CREATININE 1.73* 1.79* 1.87*  CALCIUM 8.4* 8.4* 8.4*  MG  --   --  2.8*   GFR: Estimated Creatinine Clearance: 23.1 mL/min (A) (by C-G formula based on SCr of 1.87 mg/dL (H)).  Liver Function Tests: Recent Labs  Lab 05/18/19 0102 05/19/19 0625 05/20/19 0600 05/21/19 0623  AST 43* 59* 38 47*  ALT 29 29 26  32  ALKPHOS 85 88 83 101  BILITOT 0.8 0.5 0.4 0.7  PROT 7.4 6.8 6.5 6.5  ALBUMIN 2.8* 2.5* 2.1* 2.1*    HbA1C: Hgb A1c MFr Bld  Date/Time Value Ref Range Status  05/29/2010 12:59 PM (H) <5.7 % Final   6.2 (NOTE)                                                                       According to the ADA Clinical Practice Recommendations for 2011, when HbA1c is used as a screening test:   >=6.5%   Diagnostic of Diabetes Mellitus           (if abnormal result  is confirmed)  5.7-6.4%   Increased risk of developing Diabetes Mellitus  References:Diagnosis and Classification of Diabetes Mellitus,Diabetes [id number] 1):S62-S69 and Standards of Medical Care in         Diabetes - 2011,Diabetes Care,2011,34  (Suppl 1):S11-S61.    CBG: Recent Labs  Lab 05/18/19 2309 05/21/19 2210  GLUCAP 118* 155*    Recent Results (from the past 240 hour(s))  Urine Culture     Status: Abnormal   Collection Time: 05/18/2019  8:16 AM   Specimen: Urine, Random  Result Value Ref Range Status   Specimen Description URINE, RANDOM  Final   Special Requests   Final    NONE Performed at [facility], [address] [place], [place] [address]    Culture >=100,000 COLONIES/mL KLEBSIELLA PNEUMONIAE (A)  Final   Report Status 05/19/2019 FINAL  Final   Organism ID, Bacteria KLEBSIELLA PNEUMONIAE (A)  Final      Susceptibility   Klebsiella pneumoniae - MIC*    AMPICILLIN >=32 RESISTANT Resistant      CEFAZOLIN <=4 SENSITIVE Sensitive     CEFTRIAXONE <=1 SENSITIVE Sensitive     CIPROFLOXACIN <=0.25 SENSITIVE Sensitive     GENTAMICIN <=1 SENSITIVE Sensitive     IMIPENEM <=0.25 SENSITIVE Sensitive     NITROFURANTOIN 64 INTERMEDIATE Intermediate     TRIMETH/SULFA <=20 SENSITIVE Sensitive     AMPICILLIN/SULBACTAM 4 SENSITIVE Sensitive     PIP/TAZO <=4 SENSITIVE Sensitive     Extended ESBL NEGATIVE Sensitive     * >=100,000 COLONIES/mL KLEBSIELLA PNEUMONIAE  SARS CORONAVIRUS 2 (  TAT 6-24 HRS) Nasopharyngeal Nasopharyngeal Swab     Status: Abnormal   Collection Time: 06/06/2019 11:53 PM   Specimen: Nasopharyngeal Swab  Result Value Ref Range Status   SARS Coronavirus 2 POSITIVE (A) NEGATIVE Final    Comment: RESULT CALLED TO, READ BACK BY AND VERIFIED WITH: Berta Minor, RN AT [phone] ON 05/17/2019 BY SAINVILUS S (NOTE) SARS-CoV-2 target nucleic acids are DETECTED. The SARS-CoV-2 RNA is generally detectable in upper and lower respiratory specimens during the acute phase of infection. Positive results are indicative of active infection with SARS-CoV-2. Clinical  correlation with patient history and other diagnostic information is necessary to determine patient infection status. Positive results do  not rule out bacterial infection or co-infection with other viruses. The expected result is Negative. Fact Sheet for Patients: SugarRoll.be Fact Sheet for Healthcare Providers: https://www.woods-mathews.com/ This test is not yet approved or cleared by the [place] FDA and  has been authorized for detection and/or diagnosis of SARS-CoV-2 by FDA under an Emergency Use Authorization (EUA). This EUA will remain  in effect (meaning this test c an be used) for the duration of the COVID-19 declaration under Section 564(b)(1) of the Act, 21 U.S.C. section 360bbb-3(b)(1), unless the authorization is terminated or revoked sooner. Performed at [facility], [place] [address] [place], [place] [address]   Blood culture (routine x 2)     Status: None   Collection Time: 06/09/2019 11:53 PM   Specimen: BLOOD  Result Value Ref Range Status   Specimen Description BLOOD LEFT ANTECUBITAL  Final   Special Requests   Final    BOTTLES DRAWN AEROBIC AND ANAEROBIC Blood Culture results may not be optimal due to an inadequate volume of blood received in culture bottles   Culture   Final    NO GROWTH 5 DAYS Performed at [facility], [place] [address], [place], [place] [address]    Report Status May 23, 2019 FINAL  Final  Blood culture (routine x 2)     Status: None   Collection Time: 05/17/19  1:56 AM   Specimen: BLOOD RIGHT FOREARM  Result Value Ref Range Status   Specimen Description BLOOD RIGHT FOREARM  Final   Special Requests   Final    BOTTLES DRAWN AEROBIC AND ANAEROBIC Blood Culture adequate volume   Culture   Final    NO GROWTH 5 DAYS Performed at [facility], [place] [address], [place], [place] [address]    Report Status May 23, 2019 FINAL  Final  MRSA PCR Screening     Status: Abnormal   Collection Time: 05/18/19  6:12 AM   Specimen: Nasal Mucosa; Nasopharyngeal  Result Value Ref Range Status   MRSA by PCR POSITIVE (A) NEGATIVE Final    Comment:        The GeneXpert MRSA Assay (FDA approved for NASAL specimens only), is one component of a comprehensive MRSA colonization surveillance program. It is not intended to diagnose MRSA infection nor to guide or monitor treatment for MRSA infections. RESULT CALLED TO, READ BACK BY AND VERIFIED WITH: Hazeline Junker [id number] @ [id number] [place] Performed at [facility], [place] [address] [place], [place] [address]      Scheduled Meds: . acidophilus   Oral q morning - 10a  . aspirin EC  81 mg Oral QHS  . Chlorhexidine Gluconate Cloth  6 each Topical Daily  . clopidogrel  75 mg Oral Daily  . dexamethasone (DECADRON) injection  6 mg Intravenous Q24H  .  doxepin  25 mg Oral QHS  . heparin injection (subcutaneous)  5,000 Units Subcutaneous Q12H  . mouth rinse  15 mL Mouth Rinse BID  . metoprolol tartrate  5 mg Intravenous Q6H  . multivitamin  1 tablet Oral Daily  . multivitamin with minerals   Oral q morning - 10a  . mupirocin ointment  1 application Nasal BID  . pantoprazole (PROTONIX) IV  40 mg Intravenous Q24H  . sodium chloride flush  10-40 mL Intracatheter Q12H  . zinc sulfate  220 mg Oral Daily      LOS: 5 days   Cherene Altes, MD [facility] Office  [phone] Pager - Text Page per Shea Evans  If 7PM-7AM, please contact night-coverage per Amion 2019/05/27, 9:27 AM

## 2019-06-12 NOTE — Progress Notes (Addendum)
Consulted CCMD of an  alternative agitation drug for haloperidol as QTc=0.504 secs. Haloperidol discontinued. Labs were ordered for this am as pt rhythm has a lot of PVC's

## 2019-06-12 NOTE — Progress Notes (Signed)
Larry Huffman was transported down to a private room for a 20-minute visit with his son, Larry Huffman, and his daughter-in-law, Larry Huffman. Accompanying the patient were this bedside RN and charge RN, Larry Huffman. Extended family was FaceTime called during the visit, and all questions were answered. Larry Huffman is currently back in the ICU resting comfortably on a Morphine gtt at 1mg /hr.

## 2019-06-12 NOTE — Progress Notes (Addendum)
Patient was restless and agitated the whole night with periods of rest. Patient needed a lot of re-orientation. Morphine given for CPOT=3-5. Haloperidol was discontinued this shift due to prolonged QTc. Bilateral upper soft restraints continued. Decreased oral intake secondary to pt refusal to drink, ?need maintenance infusion if not for comfort care.

## 2019-06-12 NOTE — Progress Notes (Signed)
eLink Physician-Brief Progress Note Patient Name: MUREL GUIJOSA DOB: [birth year] MRN: [id number]   Date of Service  May 23, 2019  HPI/Events of Note  Notified that Qtc interval = 0.504 seconds.   eICU Interventions  Will D/C Haldol.      Intervention Category Major Interventions: Other:  Sommer,Steven Cornelia Copa 05-23-2019, 1:49 AM

## 2019-06-12 NOTE — Death Summary Note (Addendum)
Time of death 18. Pt was in company of RT, Sharyne Peach, and RT, [facility], RT, at time of death. Asystolic heart rhythm noted on monitor, no breath sounds or heart tones auscultated by 2 separate RNs, [employer]. Family called and made aware of pt's passing. Dr Thereasa Solo, central tele and Warren Lacy also notified. ELink RN, Coralie Keens, to notify CDS.

## 2019-06-12 NOTE — Progress Notes (Signed)
Plan of care d/w Dr Lynetta Mare and Vaughan Basta, RT this morning on rounds. Pt has been refusing oral intake for the past several hours, and he was transitioned from Morphine IV pushes from Haldol last night, due to prolong QTc interval. Family called to discuss plan for today to keep Larry Huffman comfortable. Family is discussing their wishes with one another, and are deciding between an in-person visit versus FaceTime messaging with Larry Huffman. Daughter Helene Kelp is Covid positive, so she will be unable to visit her father. Son and daughter in law live in [place] and are deciding whether or not they want to travel here for a visit. Dr Thereasa Solo to reach out to family as well to clarify their wishes for Larry Huffman, including possible transition to Morphine drip for comfort.

## 2019-06-12 NOTE — Plan of Care (Signed)
  Problem: Coping: Goal: Psychosocial and spiritual needs will be supported Outcome: Progressing   Problem: Activity: Goal: Risk for activity intolerance will decrease Outcome: Progressing   Problem: Elimination: Goal: Will not experience complications related to bowel motility Outcome: Progressing   Problem: Pain Managment: Goal: General experience of comfort will improve Outcome: Progressing   Problem: Safety: Goal: Ability to remain free from injury will improve Outcome: Progressing   Problem: Skin Integrity: Goal: Risk for impaired skin integrity will decrease Outcome: Progressing

## 2019-06-12 NOTE — Progress Notes (Signed)
80cc Morphine wasted in Stericycle hazard bin from infusion bag with second RN, William Hamburger.

## 2019-06-12 NOTE — Progress Notes (Signed)
CDS referral made on this pt per request of bedside RN. TOD 1604. Per CDS - pt is not a candidate.  Referral # [id number].  Spoke with Ronna Polio.   Lucretia Field, RN.

## 2019-06-12 NOTE — Discharge Summary (Signed)
   Death Summary   [name] [id number] DOB: 1923/10/09 DOA: 02-Jun-2019  PCP: Lawerance Cruel, MD  Admit date: 06/02/19 Date of Death: 08-Jun-2019  Final Diagnoses:  Covid pneumonia Acute hypoxic respiratory failure Dementia Head trauma secondary to mechanical fall Acute kidney injury on CKD stage III Klebsiella pneumoniae UTI New atrial fibrillation in the context of acute illness CAD HLD Iron deficiency anemia  History of present illness:  85yo with a history of CAD status post PCI, HLD, HTN, dementia, and PVD who presented after a fall and head injury. At home patient sustained a mechanical fall while getting out of his bed. His ROS noted fevers and chills for 24 hours. He was found to be tachycardic and tachypneic,w/ sat of 90% on room air. He was SARS CoV2 positive.A CXR noted bilateral reticular nodular infiltrates, right upper lobe and left lower lobe.Patient was admitted for acute hypoxic respiratory failure due to COVID pneumonia.  Hospital Course:  The patient was admitted to the hospital with a diagnosis of Covid pneumonia with severe acute hypoxic respiratory failure.  He was treated with remdesivir and Decadron.  He was also found to have a Klebsiella urinary tract infection for which he received directed antibiotic therapy.  Unfortunately his acute hypoxic respiratory failure progressed despite active treatment for his viral pneumonia.  He required high flow nasal cannula support with further escalation to include the addition of a nonrebreather facemask to the HFNC.  Given his advanced age NO CODE BLUE status was established is most appropriate for this patient, especially in the context of waning quality of life prior to this hospital stay.  Though he waxed and waned somewhat during his hospital stay he never mounted a consistent significant recovery.  On 06-08-23 the patient had progressive the point that he was clearly struggling to breathe with dysfunctional  respiratory mechanics and agitation.  He was noted to be in a active state of decline.  Family was informed that everything available have been attempted and that the patient was actively dying regardless.  The patient was transitioned to comfort focused care.  He was cared for closely by multiple members of the staff to include his RN as well as respiratory therapy.  At 16: 04 in the company of his RN as well as 2 respiratory therapist the patient died peacefully.  The cause of his death was Covid pneumonia.   Signed:  Cherene Altes  [facility] 06/01/2019, 6:37 PM

## 2019-06-12 DEATH — deceased
